# Patient Record
Sex: Female | Born: 1935 | Race: White | Hispanic: No | Smoking: Never smoker
Health system: Southern US, Community
[De-identification: ages and names within clinical notes are randomized; demographics above are authoritative.]

## PROBLEM LIST (undated history)

## (undated) DIAGNOSIS — I5020 Unspecified systolic (congestive) heart failure: Secondary | ICD-10-CM

## (undated) DIAGNOSIS — I10 Essential (primary) hypertension: Secondary | ICD-10-CM

## (undated) DIAGNOSIS — N189 Chronic kidney disease, unspecified: Secondary | ICD-10-CM

## (undated) DIAGNOSIS — G2581 Restless legs syndrome: Secondary | ICD-10-CM

## (undated) DIAGNOSIS — I4819 Other persistent atrial fibrillation: Secondary | ICD-10-CM

## (undated) DIAGNOSIS — I00 Rheumatic fever without heart involvement: Secondary | ICD-10-CM

## (undated) DIAGNOSIS — N200 Calculus of kidney: Secondary | ICD-10-CM

## (undated) DIAGNOSIS — I5189 Other ill-defined heart diseases: Secondary | ICD-10-CM

## (undated) DIAGNOSIS — M1990 Unspecified osteoarthritis, unspecified site: Secondary | ICD-10-CM

## (undated) DIAGNOSIS — R32 Unspecified urinary incontinence: Secondary | ICD-10-CM

## (undated) DIAGNOSIS — G4733 Obstructive sleep apnea (adult) (pediatric): Secondary | ICD-10-CM

## (undated) DIAGNOSIS — Z9289 Personal history of other medical treatment: Secondary | ICD-10-CM

## (undated) DIAGNOSIS — E785 Hyperlipidemia, unspecified: Secondary | ICD-10-CM

## (undated) HISTORY — PX: ABDOMINAL HYSTERECTOMY: SHX81

## (undated) HISTORY — DX: Calculus of kidney: N20.0

## (undated) HISTORY — DX: Unspecified urinary incontinence: R32

## (undated) HISTORY — DX: Unspecified systolic (congestive) heart failure: I50.20

## (undated) HISTORY — DX: Hyperlipidemia, unspecified: E78.5

## (undated) HISTORY — DX: Other ill-defined heart diseases: I51.89

## (undated) HISTORY — DX: Other persistent atrial fibrillation: I48.19

## (undated) HISTORY — PX: TONSILLECTOMY: SHX5217

## (undated) HISTORY — PX: KIDNEY STONE SURGERY: SHX686

## (undated) HISTORY — DX: Obstructive sleep apnea (adult) (pediatric): G47.33

## (undated) HISTORY — DX: Personal history of other medical treatment: Z92.89

## (undated) HISTORY — DX: Restless legs syndrome: G25.81

## (undated) HISTORY — DX: Rheumatic fever without heart involvement: I00

## (undated) HISTORY — PX: BREAST SURGERY: SHX581

## (undated) HISTORY — PX: SPINE SURGERY: SHX786

## (undated) HISTORY — DX: Unspecified osteoarthritis, unspecified site: M19.90

## (undated) HISTORY — DX: Essential (primary) hypertension: I10

---

## 2003-09-28 HISTORY — PX: REPLACEMENT TOTAL KNEE: SUR1224

## 2011-09-29 DIAGNOSIS — M810 Age-related osteoporosis without current pathological fracture: Secondary | ICD-10-CM | POA: Diagnosis not present

## 2011-09-29 DIAGNOSIS — IMO0001 Reserved for inherently not codable concepts without codable children: Secondary | ICD-10-CM | POA: Diagnosis not present

## 2011-10-01 DIAGNOSIS — IMO0001 Reserved for inherently not codable concepts without codable children: Secondary | ICD-10-CM | POA: Diagnosis not present

## 2011-10-01 DIAGNOSIS — M810 Age-related osteoporosis without current pathological fracture: Secondary | ICD-10-CM | POA: Diagnosis not present

## 2011-10-04 DIAGNOSIS — M810 Age-related osteoporosis without current pathological fracture: Secondary | ICD-10-CM | POA: Diagnosis not present

## 2011-10-04 DIAGNOSIS — IMO0001 Reserved for inherently not codable concepts without codable children: Secondary | ICD-10-CM | POA: Diagnosis not present

## 2011-10-08 DIAGNOSIS — Z7901 Long term (current) use of anticoagulants: Secondary | ICD-10-CM | POA: Diagnosis not present

## 2011-10-08 DIAGNOSIS — Z5181 Encounter for therapeutic drug level monitoring: Secondary | ICD-10-CM | POA: Diagnosis not present

## 2011-10-28 DIAGNOSIS — Z7901 Long term (current) use of anticoagulants: Secondary | ICD-10-CM | POA: Diagnosis not present

## 2011-12-03 DIAGNOSIS — Z7901 Long term (current) use of anticoagulants: Secondary | ICD-10-CM | POA: Diagnosis not present

## 2011-12-15 ENCOUNTER — Telehealth: Payer: Self-pay | Admitting: Family Medicine

## 2011-12-15 NOTE — Telephone Encounter (Signed)
Pt is new to est have her set up for 01/03/12 but daughter is concerned because she is having a lot of back pain. Can she been worked in for a new pt appt sooner?

## 2011-12-15 NOTE — Telephone Encounter (Signed)
yes

## 2011-12-20 ENCOUNTER — Encounter: Payer: Self-pay | Admitting: Family Medicine

## 2011-12-20 ENCOUNTER — Ambulatory Visit (INDEPENDENT_AMBULATORY_CARE_PROVIDER_SITE_OTHER): Payer: Medicare Other | Admitting: Family Medicine

## 2011-12-20 VITALS — BP 130/90 | HR 72 | Temp 97.6°F | Resp 12 | Ht 62.0 in | Wt 188.0 lb

## 2011-12-20 DIAGNOSIS — M1990 Unspecified osteoarthritis, unspecified site: Secondary | ICD-10-CM | POA: Insufficient documentation

## 2011-12-20 DIAGNOSIS — N3941 Urge incontinence: Secondary | ICD-10-CM | POA: Insufficient documentation

## 2011-12-20 DIAGNOSIS — M5416 Radiculopathy, lumbar region: Secondary | ICD-10-CM

## 2011-12-20 DIAGNOSIS — I4891 Unspecified atrial fibrillation: Secondary | ICD-10-CM

## 2011-12-20 DIAGNOSIS — G4733 Obstructive sleep apnea (adult) (pediatric): Secondary | ICD-10-CM | POA: Insufficient documentation

## 2011-12-20 DIAGNOSIS — H6120 Impacted cerumen, unspecified ear: Secondary | ICD-10-CM

## 2011-12-20 DIAGNOSIS — Z87442 Personal history of urinary calculi: Secondary | ICD-10-CM | POA: Insufficient documentation

## 2011-12-20 DIAGNOSIS — I10 Essential (primary) hypertension: Secondary | ICD-10-CM | POA: Insufficient documentation

## 2011-12-20 DIAGNOSIS — E785 Hyperlipidemia, unspecified: Secondary | ICD-10-CM | POA: Diagnosis not present

## 2011-12-20 DIAGNOSIS — G2581 Restless legs syndrome: Secondary | ICD-10-CM | POA: Insufficient documentation

## 2011-12-20 DIAGNOSIS — IMO0002 Reserved for concepts with insufficient information to code with codable children: Secondary | ICD-10-CM

## 2011-12-20 DIAGNOSIS — M810 Age-related osteoporosis without current pathological fracture: Secondary | ICD-10-CM | POA: Insufficient documentation

## 2011-12-20 LAB — LIPID PANEL
Cholesterol: 169 mg/dL (ref 0–200)
LDL Cholesterol: 69 mg/dL (ref 0–99)
Total CHOL/HDL Ratio: 2

## 2011-12-20 LAB — BASIC METABOLIC PANEL
BUN: 23 mg/dL (ref 6–23)
Calcium: 9.6 mg/dL (ref 8.4–10.5)
Chloride: 106 mEq/L (ref 96–112)
Creatinine, Ser: 1 mg/dL (ref 0.4–1.2)

## 2011-12-20 LAB — HEPATIC FUNCTION PANEL
AST: 26 U/L (ref 0–37)
Alkaline Phosphatase: 46 U/L (ref 39–117)
Bilirubin, Direct: 0.1 mg/dL (ref 0.0–0.3)
Total Protein: 6.9 g/dL (ref 6.0–8.3)

## 2011-12-20 MED ORDER — ROPINIROLE HCL 1 MG PO TABS: 1.0000 mg | ORAL_TABLET | Freq: Two times a day (BID) | ORAL | Status: DC

## 2011-12-20 NOTE — Progress Notes (Signed)
Subjective:    Patient ID: Angel Meyer, female    DOB: [birth year], 78 y.o.   MRN: [id number]  HPI  Patient new to establish care. She lives in [place] but is here several months of the year staying with family. She has a fairly complicated past medical history which is reviewed. Old records pending at this time. She has history of osteoarthritis, multiple back surgeries, history of atrial fibrillation, hypertension, hyperlipidemia, questionable history of CHF, history of kidney stones, osteoporosis, urge urine incontinence, restless leg syndrome, and obstructive sleep apnea. Recently went for hearing aid and was told she had cerumen impaction and needs irrigation.  Other acute issue is that she's had recurrent low back pain. Several months of progressive right lumbar back pain with right radiculopathy symptoms. No clear weakness. Pain radiates down right lower extremity all the way to foot. No numbness. 9/10 severity at times. Using Tylenol and heat with minimal relief.  Requesting refills of ropinirole for restless legs.  She has occasional constipation which may be related to her Ditropan for urinary urgency.  Past Medical History  Diagnosis Date  . Arthritis   . Arrhythmia   . Chronic kidney disease     stones  . Hypertension   . Hyperlipidemia   . Rheumatic fever   . History of blood transfusion   . Incontinence of urine   . CHF (congestive heart failure)     by history.  Records pending  . Atrial fibrillation   . Osteoporosis   . OSA (obstructive sleep apnea)   . Restless legs    Past Surgical History  Procedure Date  . Breast surgery     biopsy  . Tonsillectomy   . Abdominal hysterectomy   . Spine surgery     laminectomy 1965, spinal fusion with rod 2005  . Replacement total knee 2005  . Kidney stone surgery     1997 removed    reports that she has never smoked. She does not have any smokeless tobacco history on file. Her alcohol and drug histories not on file. family  history includes Cancer in her mother and paternal grandfather and Diabetes in her mother. Not on File    Review of Systems  Constitutional: Positive for fatigue. Negative for fever, chills, activity change and unexpected weight change.  HENT: Positive for hearing loss. Negative for trouble swallowing.   Respiratory: Negative for cough, shortness of breath and wheezing.   Cardiovascular: Negative for chest pain, palpitations and leg swelling.  Gastrointestinal: Positive for constipation. Negative for nausea, vomiting and blood in stool.  Genitourinary: Negative for dysuria.  Musculoskeletal: Positive for back pain. Negative for joint swelling.  Skin: Negative for rash.  Neurological: Negative for dizziness and syncope.  Hematological: Negative for adenopathy. Does not bruise/bleed easily.  Psychiatric/Behavioral: Negative for confusion and dysphoric mood.       Objective:   Physical Exam  Constitutional: She is oriented to person, place, and time. She appears well-developed and well-nourished. No distress.  HENT:  Mouth/Throat: Oropharynx is clear and moist.       Cerumen impaction right canal. Minimal cerumen left canal.  Removed with irrigation and TMs normal.  Neck: Neck supple. No thyromegaly present.  Cardiovascular: Normal rate and regular rhythm.  Exam reveals no gallop.   Pulmonary/Chest: Effort normal and breath sounds normal. No respiratory distress. She has no wheezes. She has no rales.  Musculoskeletal: She exhibits no edema.  Lymphadenopathy:    She has no cervical adenopathy.  Neurological: She is  alert and oriented to person, place, and time. No cranial nerve deficit.       Reflexes symmetrical lower extremity. No strength deficits noted  Skin: No rash noted.       Small bruise left anterior leg. No skin break  Psychiatric: She has a normal mood and affect. Her behavior is normal. Judgment and thought content normal.          Assessment & Plan:  #1 cerumen  impaction. Irrigation and then she will proceed with audiology evaluation #2 history of chronic low back pain with multiple surgeries. Recent progressive right radiculopathy symptoms for several months. Consider neurosurgical referral. We'll need to get old records #3 history of restless leg syndrome. Refill ropinirole #4 history of osteoarthritis involving multiple joints #5 history of atrial fibrillation on chronic Coumadin. Patient wishes to establish with local cardiologist. Questionable history of CHF.  Get old records. Reportedly had INR 2 weeks ago normal-recheck today. #6 hypertension stable- check basic metabolic panel #7 hyperlipidemia. Patient presents with an interesting regimen of 2 different statins and Zetia. Check lipid and hepatic panel  #8 history of osteoporosis  #9 history of urge urinary incontinence #10 obstructive sleep apnea. Patient currently on CPAP

## 2011-12-20 NOTE — Patient Instructions (Signed)
  Latest dosing instructions   Total Glynis Smiles Tue Wed Thu Fri Sat   25 5 mg 2.5 mg 5 mg 2.5 mg 2.5 mg 2.5 mg 5 mg    (2.5 mg2) (2.5 mg1) (2.5 mg2) (2.5 mg1) (2.5 mg1) (2.5 mg1) (2.5 mg2)

## 2011-12-21 ENCOUNTER — Telehealth: Payer: Self-pay | Admitting: Speech Pathology

## 2011-12-21 ENCOUNTER — Other Ambulatory Visit: Payer: Self-pay | Admitting: Family Medicine

## 2011-12-21 DIAGNOSIS — I4891 Unspecified atrial fibrillation: Secondary | ICD-10-CM

## 2011-12-21 MED ORDER — ROPINIROLE HCL 1 MG PO TABS: 1.0000 mg | ORAL_TABLET | Freq: Two times a day (BID) | ORAL | Status: DC

## 2011-12-21 NOTE — Telephone Encounter (Signed)
Pt needs a refill for Ropinirol 1 mg, 2 x a day.  She would like this called into [facility] in [place]. She uses this for Restless Leg Syndrome.

## 2011-12-21 NOTE — Progress Notes (Signed)
Quick Note:  Pt informed and I will schedule future INR in case she is still in town ______

## 2012-01-03 ENCOUNTER — Ambulatory Visit: Payer: Self-pay | Admitting: Family Medicine

## 2012-01-11 DIAGNOSIS — M76899 Other specified enthesopathies of unspecified lower limb, excluding foot: Secondary | ICD-10-CM | POA: Diagnosis not present

## 2012-01-11 DIAGNOSIS — M545 Low back pain: Secondary | ICD-10-CM | POA: Diagnosis not present

## 2012-01-19 ENCOUNTER — Institutional Professional Consult (permissible substitution): Payer: Medicare Other | Admitting: Internal Medicine

## 2012-02-08 DIAGNOSIS — M545 Low back pain: Secondary | ICD-10-CM | POA: Diagnosis not present

## 2012-02-08 DIAGNOSIS — M1610 Unilateral primary osteoarthritis, unspecified hip: Secondary | ICD-10-CM | POA: Diagnosis not present

## 2012-02-08 DIAGNOSIS — IMO0002 Reserved for concepts with insufficient information to code with codable children: Secondary | ICD-10-CM | POA: Diagnosis not present

## 2012-02-08 DIAGNOSIS — M961 Postlaminectomy syndrome, not elsewhere classified: Secondary | ICD-10-CM | POA: Diagnosis not present

## 2012-02-09 DIAGNOSIS — Z7901 Long term (current) use of anticoagulants: Secondary | ICD-10-CM | POA: Diagnosis not present

## 2012-02-09 DIAGNOSIS — Z5181 Encounter for therapeutic drug level monitoring: Secondary | ICD-10-CM | POA: Diagnosis not present

## 2012-02-10 DIAGNOSIS — I4891 Unspecified atrial fibrillation: Secondary | ICD-10-CM | POA: Diagnosis not present

## 2012-02-10 DIAGNOSIS — E7800 Pure hypercholesterolemia, unspecified: Secondary | ICD-10-CM | POA: Diagnosis not present

## 2012-02-10 DIAGNOSIS — I509 Heart failure, unspecified: Secondary | ICD-10-CM | POA: Diagnosis not present

## 2012-02-15 DIAGNOSIS — I059 Rheumatic mitral valve disease, unspecified: Secondary | ICD-10-CM | POA: Diagnosis not present

## 2012-02-15 DIAGNOSIS — I10 Essential (primary) hypertension: Secondary | ICD-10-CM | POA: Diagnosis not present

## 2012-02-15 DIAGNOSIS — I509 Heart failure, unspecified: Secondary | ICD-10-CM | POA: Diagnosis not present

## 2012-02-15 DIAGNOSIS — I4891 Unspecified atrial fibrillation: Secondary | ICD-10-CM | POA: Diagnosis not present

## 2012-02-25 DIAGNOSIS — Z5181 Encounter for therapeutic drug level monitoring: Secondary | ICD-10-CM | POA: Diagnosis not present

## 2012-02-25 DIAGNOSIS — M961 Postlaminectomy syndrome, not elsewhere classified: Secondary | ICD-10-CM | POA: Diagnosis not present

## 2012-02-25 DIAGNOSIS — Z01818 Encounter for other preprocedural examination: Secondary | ICD-10-CM | POA: Diagnosis not present

## 2012-02-25 DIAGNOSIS — M1610 Unilateral primary osteoarthritis, unspecified hip: Secondary | ICD-10-CM | POA: Diagnosis not present

## 2012-02-25 DIAGNOSIS — Z7901 Long term (current) use of anticoagulants: Secondary | ICD-10-CM | POA: Diagnosis not present

## 2012-02-25 DIAGNOSIS — M545 Low back pain, unspecified: Secondary | ICD-10-CM | POA: Diagnosis not present

## 2012-02-25 DIAGNOSIS — M533 Sacrococcygeal disorders, not elsewhere classified: Secondary | ICD-10-CM | POA: Diagnosis not present

## 2012-03-23 DIAGNOSIS — M4180 Other forms of scoliosis, site unspecified: Secondary | ICD-10-CM | POA: Diagnosis not present

## 2012-03-23 DIAGNOSIS — M4120 Other idiopathic scoliosis, site unspecified: Secondary | ICD-10-CM | POA: Diagnosis not present

## 2012-03-23 DIAGNOSIS — I509 Heart failure, unspecified: Secondary | ICD-10-CM | POA: Diagnosis not present

## 2012-03-23 DIAGNOSIS — R0602 Shortness of breath: Secondary | ICD-10-CM | POA: Diagnosis not present

## 2012-03-23 DIAGNOSIS — R109 Unspecified abdominal pain: Secondary | ICD-10-CM | POA: Diagnosis not present

## 2012-03-23 DIAGNOSIS — M546 Pain in thoracic spine: Secondary | ICD-10-CM | POA: Diagnosis not present

## 2012-03-23 DIAGNOSIS — E7800 Pure hypercholesterolemia, unspecified: Secondary | ICD-10-CM | POA: Diagnosis not present

## 2012-03-23 DIAGNOSIS — IMO0002 Reserved for concepts with insufficient information to code with codable children: Secondary | ICD-10-CM | POA: Diagnosis not present

## 2012-03-23 DIAGNOSIS — R079 Chest pain, unspecified: Secondary | ICD-10-CM | POA: Diagnosis not present

## 2012-03-23 DIAGNOSIS — Z981 Arthrodesis status: Secondary | ICD-10-CM | POA: Diagnosis not present

## 2012-03-27 DIAGNOSIS — Z7901 Long term (current) use of anticoagulants: Secondary | ICD-10-CM | POA: Diagnosis not present

## 2012-03-27 DIAGNOSIS — Z5181 Encounter for therapeutic drug level monitoring: Secondary | ICD-10-CM | POA: Diagnosis not present

## 2012-03-28 DIAGNOSIS — M546 Pain in thoracic spine: Secondary | ICD-10-CM | POA: Diagnosis not present

## 2012-03-28 DIAGNOSIS — M25559 Pain in unspecified hip: Secondary | ICD-10-CM | POA: Diagnosis not present

## 2012-03-29 DIAGNOSIS — E782 Mixed hyperlipidemia: Secondary | ICD-10-CM | POA: Diagnosis not present

## 2012-03-29 DIAGNOSIS — Z79899 Other long term (current) drug therapy: Secondary | ICD-10-CM | POA: Diagnosis not present

## 2012-03-29 DIAGNOSIS — M546 Pain in thoracic spine: Secondary | ICD-10-CM | POA: Diagnosis not present

## 2012-03-29 DIAGNOSIS — Z5181 Encounter for therapeutic drug level monitoring: Secondary | ICD-10-CM | POA: Diagnosis not present

## 2012-05-04 DIAGNOSIS — M1610 Unilateral primary osteoarthritis, unspecified hip: Secondary | ICD-10-CM | POA: Diagnosis not present

## 2012-05-11 DIAGNOSIS — Z7901 Long term (current) use of anticoagulants: Secondary | ICD-10-CM | POA: Diagnosis not present

## 2012-05-11 DIAGNOSIS — Z5181 Encounter for therapeutic drug level monitoring: Secondary | ICD-10-CM | POA: Diagnosis not present

## 2012-06-02 DIAGNOSIS — Z7901 Long term (current) use of anticoagulants: Secondary | ICD-10-CM | POA: Diagnosis not present

## 2012-06-02 DIAGNOSIS — Z5181 Encounter for therapeutic drug level monitoring: Secondary | ICD-10-CM | POA: Diagnosis not present

## 2012-06-12 DIAGNOSIS — Z7901 Long term (current) use of anticoagulants: Secondary | ICD-10-CM | POA: Diagnosis not present

## 2012-06-16 ENCOUNTER — Ambulatory Visit (INDEPENDENT_AMBULATORY_CARE_PROVIDER_SITE_OTHER): Payer: Medicare Other | Admitting: Internal Medicine

## 2012-06-16 ENCOUNTER — Encounter: Payer: Self-pay | Admitting: Internal Medicine

## 2012-06-16 VITALS — BP 124/76 | HR 61 | Ht 62.0 in | Wt 185.0 lb

## 2012-06-16 DIAGNOSIS — E785 Hyperlipidemia, unspecified: Secondary | ICD-10-CM

## 2012-06-16 DIAGNOSIS — I4891 Unspecified atrial fibrillation: Secondary | ICD-10-CM | POA: Diagnosis not present

## 2012-06-16 DIAGNOSIS — I10 Essential (primary) hypertension: Secondary | ICD-10-CM | POA: Diagnosis not present

## 2012-06-16 NOTE — Progress Notes (Signed)
Primary Care Physician: Kristian Covey, MD Primary Cardiologist in [place]:  Dr Janett Billow  Angel Meyer is a 78 y.o. female with a h/o atrial fibirillation and diastolic dysfunction who presents to establish care.  She reports that she initially developed atrial fibrillation in 2000.  During afib, she develops palpitations, fatigue, and diastolic dysfunction.  She has been cardioverted on three occasions, most recently 3/12.  She is unaware of triggers or precipitants of her afib, though he daughter reports that with her first episode she was recovering from a virus.  She has a h/o childhood rheumatic fever. She is primarily limited by her back and hip.  He daughter notices that she has SOB with moderate activity.  She also reports mild postural dizziness. Today, she denies symptoms of chest pain, orthopnea, PND, lower extremity edema, dizziness, presyncope, syncope, or neurologic sequela. The patient is tolerating medications without difficulties and is otherwise without complaint today.   Past Medical History  Diagnosis Date  . Arthritis   . Persistent atrial fibrillation   . Renal calculi   . Hypertension   . Hyperlipidemia   . Rheumatic fever     age 24  . History of blood transfusion   . Incontinence of urine   . Diastolic dysfunction     preserved EF,  CHF In setting of afib previously  . Osteoporosis   . OSA (obstructive sleep apnea)     uses CPAP  . Restless legs    Past Surgical History  Procedure Date  . Breast surgery     benign biopsy  . Tonsillectomy   . Abdominal hysterectomy   . Spine surgery     laminectomy 1965, spinal fusion with rod 2005  . Replacement total knee 2005  . Kidney stone surgery     1997 removed    Current Outpatient Prescriptions  Medication Sig Dispense Refill  . acetaminophen (TYLENOL) 650 MG CR tablet Take 650 mg by mouth every 8 (eight) hours as needed.      Marland Kitchen aspirin 81 MG chewable tablet Chew by mouth. 1 tab in the am, 2 tabs in  the pm      . Cholecalciferol (VITAMIN D) 2000 UNITS tablet Take 2,000 Units by mouth daily.      . Coenzyme Q10 (CO Q-10) 300 MG CAPS Take by mouth daily.      . cyanocobalamin 1000 MCG tablet Take 100 mcg by mouth daily.      Marland Kitchen diltiazem (CARDIZEM CD) 240 MG 24 hr capsule Take 240 mg by mouth daily.      . diphenhydrAMINE (SOMINEX) 25 MG tablet Take 25 mg by mouth at bedtime as needed.      Marland Kitchen escitalopram (LEXAPRO) 10 MG tablet Take 10 mg by mouth daily.      . famotidine (PEPCID) 20 MG tablet Take 20 mg by mouth 2 (two) times daily.      . furosemide (LASIX) 40 MG tablet Take 40 mg by mouth daily.      [name] Oil 300 MG CAPS Take by mouth daily.      Marland Kitchen lisinopril (PRINIVIL,ZESTRIL) 10 MG tablet Take 10 mg by mouth daily.      . Magnesium 500 MG TABS Take by mouth daily.      Marland Kitchen oxybutynin (DITROPAN-XL) 10 MG 24 hr tablet Take 10 mg by mouth daily.      . potassium chloride SA (K-DUR,KLOR-CON) 20 MEQ tablet Take 20 mEq by mouth daily.      Marland Kitchen  rOPINIRole (REQUIP) 1 MG tablet Take 1 tablet (1 mg total) by mouth 2 (two) times daily.  60 tablet  11  . rosuvastatin (CRESTOR) 40 MG tablet Take 40 mg by mouth daily.      Marland Kitchen warfarin (COUMADIN) 2 MG tablet Take 2 mg by mouth as directed.        Allergies  Allergen Reactions  . Ciprofloxacin   . Demerol (Meperidine)   . Sulfa Antibiotics     History   Social History  . Marital Status: Divorced    Spouse Name: N/A    Number of Children: N/A  . Years of Education: N/A   Occupational History  . Not on file.   Social History Main Topics  . Smoking status: Never Smoker   . Smokeless tobacco: Not on file  . Alcohol Use: Yes     glass of wine each day  . Drug Use: No  . Sexually Active: Not on file   Other Topics Concern  . Not on file   Social History Narrative   Lives in [place] alone.  Spends 4-6 months per year in [place] with her daughter.Retired Dipl[employer] Services operational officer    Family History  Problem Relation Age of Onset  . Cancer Mother      breast  . Diabetes Mother   . Cancer Paternal Grandfather     ROS- All systems are reviewed and negative except as per the HPI above  Physical Exam: Filed Vitals:   06/16/12 0905  BP: 124/76  Pulse: 61  Height: 5\' 2"  (1.575 m)  Weight: 185 lb (83.915 kg)  SpO2: 95%    GEN- The patient is well appearing, alert and oriented x 3 today.   Head- normocephalic, atraumatic Eyes-  Sclera clear, conjunctiva pink Ears- hearing intact Oropharynx- clear Neck- supple, no JVP Lymph- no cervical lymphadenopathy Lungs- Clear to ausculation bilaterally, normal work of breathing Heart- Regular rate and rhythm, no murmurs, rubs or gallops, PMI not laterally displaced GI- soft, NT, ND, + BS Extremities- no clubbing, cyanosis, or edema MS- no significant deformity or atrophy Skin- no rash or lesion Psych- euthymic mood, full affect Neuro- strength and sensation are intact  EKG today reveals sinus rhythm 61 bpm, PR 164, QRS 78, QTc 453, septal infarct pattern Echo 03/23/11- EF normal, mild MR, mild LA enlargement  Myoview 03/19/11- myoview 03/19/11- no ischemia, normal EF   Assessment and Plan:

## 2012-06-16 NOTE — Patient Instructions (Addendum)
Your physician recommends that you schedule a follow-up appointment as needed   Your physician has recommended you make the following change in your medication:  1) Stop Aspirin

## 2012-06-26 NOTE — Assessment & Plan Note (Signed)
Lipids reviewed No changes

## 2012-06-26 NOTE — Assessment & Plan Note (Signed)
The patient presents today to establish care as she spends part of the year in [place].  She has been well cared for in [place] and continues to see her local cardiologist there when she is home.  Continue current regimen with coumadin.  When she eventually moved to [place] full time, we can follow her INRs.  Stop ASA today.

## 2012-06-26 NOTE — Assessment & Plan Note (Signed)
Stable No change required today  

## 2012-06-29 ENCOUNTER — Encounter: Payer: Self-pay | Admitting: Internal Medicine

## 2012-07-26 DIAGNOSIS — I4891 Unspecified atrial fibrillation: Secondary | ICD-10-CM | POA: Diagnosis not present

## 2012-07-26 DIAGNOSIS — E7800 Pure hypercholesterolemia, unspecified: Secondary | ICD-10-CM | POA: Diagnosis not present

## 2012-07-26 DIAGNOSIS — I10 Essential (primary) hypertension: Secondary | ICD-10-CM | POA: Diagnosis not present

## 2012-08-14 DIAGNOSIS — Z5181 Encounter for therapeutic drug level monitoring: Secondary | ICD-10-CM | POA: Diagnosis not present

## 2012-08-14 DIAGNOSIS — Z7901 Long term (current) use of anticoagulants: Secondary | ICD-10-CM | POA: Diagnosis not present

## 2012-08-15 DIAGNOSIS — I4891 Unspecified atrial fibrillation: Secondary | ICD-10-CM | POA: Diagnosis not present

## 2012-08-15 DIAGNOSIS — E7800 Pure hypercholesterolemia, unspecified: Secondary | ICD-10-CM | POA: Diagnosis not present

## 2012-08-15 DIAGNOSIS — I10 Essential (primary) hypertension: Secondary | ICD-10-CM | POA: Diagnosis not present

## 2012-08-15 DIAGNOSIS — I059 Rheumatic mitral valve disease, unspecified: Secondary | ICD-10-CM | POA: Diagnosis not present

## 2012-09-06 DIAGNOSIS — H2510 Age-related nuclear cataract, unspecified eye: Secondary | ICD-10-CM | POA: Diagnosis not present

## 2012-09-06 DIAGNOSIS — Z7901 Long term (current) use of anticoagulants: Secondary | ICD-10-CM | POA: Diagnosis not present

## 2012-09-06 DIAGNOSIS — Z5181 Encounter for therapeutic drug level monitoring: Secondary | ICD-10-CM | POA: Diagnosis not present

## 2012-09-13 DIAGNOSIS — Z7901 Long term (current) use of anticoagulants: Secondary | ICD-10-CM | POA: Diagnosis not present

## 2012-09-18 ENCOUNTER — Ambulatory Visit (INDEPENDENT_AMBULATORY_CARE_PROVIDER_SITE_OTHER): Payer: Medicare Other | Admitting: Family Medicine

## 2012-09-18 ENCOUNTER — Encounter: Payer: Self-pay | Admitting: Family Medicine

## 2012-09-18 VITALS — BP 120/80 | Temp 99.8°F | Wt 188.0 lb

## 2012-09-18 DIAGNOSIS — R059 Cough, unspecified: Secondary | ICD-10-CM

## 2012-09-18 DIAGNOSIS — R05 Cough: Secondary | ICD-10-CM | POA: Diagnosis not present

## 2012-09-18 MED ORDER — HYDROCODONE-HOMATROPINE 5-1.5 MG/5ML PO SYRP: 5.0000 mL | ORAL_SOLUTION | Freq: Four times a day (QID) | ORAL | Status: AC | PRN

## 2012-09-18 MED ORDER — AZITHROMYCIN 250 MG PO TABS: ORAL_TABLET | ORAL | Status: DC

## 2012-09-18 NOTE — Progress Notes (Signed)
  Subjective:    Patient ID: Angel Meyer, female    DOB: [birth year], 78 y.o.   MRN: [id number]  HPI  Acute visit. Onset about 9 days ago typical cold-like symptoms. Over the past 2 days developed chills and possible low-grade fever though temperature not taken. She's had cough which is mostly dry but occasionally productive. Mild diarrhea yesterday but none today. No nausea or vomiting. No dyspnea. Nonsmoker. History of allergies to Cipro and sulfa.  Patient try Mucinex and over-the-counter tests and without much relief. Her chronic problems include history obstructive sleep apnea, hypertension, hyperlipidemia, osteoarthritis, and osteoporosis. She has chronic atrial fibrillation on Coumadin. INR reportedly normal last week. No recent bleeding complications   Review of Systems  Constitutional: Positive for chills.  HENT: Positive for congestion. Negative for sore throat and voice change.   Respiratory: Positive for cough. Negative for shortness of breath and wheezing.   Cardiovascular: Negative for chest pain.  Hematological: Does not bruise/bleed easily.       Objective:   Physical Exam  Constitutional: She appears well-developed and well-nourished.  HENT:  Mouth/Throat: Oropharynx is clear and moist.       Right canal is full cerumen. Left is normal  Neck: Neck supple.  Cardiovascular: Normal rate.   Pulmonary/Chest: Effort normal and breath sounds normal. No respiratory distress. She has no wheezes. She has no rales.  Musculoskeletal: She exhibits no edema.  Lymphadenopathy:    She has no cervical adenopathy.          Assessment & Plan:  Cough. Concerning is the fact that at her age she has chills and possible low-grade fever 9 days into what sounded like typical viral illness. Start Zithromax. Hycodan for nighttime cough. Followup promptly for any worsening symptoms. Chest x-ray if any worsening symptoms

## 2012-09-18 NOTE — Patient Instructions (Addendum)
Follow up for any shortness of breath, vomiting, or any increasing fever.

## 2012-09-29 DIAGNOSIS — Z7901 Long term (current) use of anticoagulants: Secondary | ICD-10-CM | POA: Diagnosis not present

## 2012-10-03 ENCOUNTER — Ambulatory Visit: Payer: Medicare Other | Admitting: Internal Medicine

## 2012-10-13 DIAGNOSIS — Z7901 Long term (current) use of anticoagulants: Secondary | ICD-10-CM | POA: Diagnosis not present

## 2012-10-23 DIAGNOSIS — Z7901 Long term (current) use of anticoagulants: Secondary | ICD-10-CM | POA: Diagnosis not present

## 2012-10-27 DIAGNOSIS — Z7901 Long term (current) use of anticoagulants: Secondary | ICD-10-CM | POA: Diagnosis not present

## 2012-11-02 DIAGNOSIS — Z7901 Long term (current) use of anticoagulants: Secondary | ICD-10-CM | POA: Diagnosis not present

## 2012-11-06 ENCOUNTER — Encounter: Payer: Self-pay | Admitting: Family Medicine

## 2012-11-06 ENCOUNTER — Ambulatory Visit (INDEPENDENT_AMBULATORY_CARE_PROVIDER_SITE_OTHER): Payer: Medicare Other | Admitting: Family Medicine

## 2012-11-06 VITALS — BP 102/68 | HR 92 | Temp 97.9°F | Wt 189.0 lb

## 2012-11-06 DIAGNOSIS — J069 Acute upper respiratory infection, unspecified: Secondary | ICD-10-CM

## 2012-11-06 MED ORDER — HYDROCODONE-HOMATROPINE 5-1.5 MG/5ML PO SYRP: 5.0000 mL | ORAL_SOLUTION | Freq: Three times a day (TID) | ORAL | Status: DC | PRN

## 2012-11-06 MED ORDER — AMOXICILLIN 500 MG PO CAPS: 500.0000 mg | ORAL_CAPSULE | Freq: Three times a day (TID) | ORAL | Status: DC

## 2012-11-06 NOTE — Patient Instructions (Addendum)
INSTRUCTIONS FOR UPPER RESPIRATORY INFECTION:  -plenty of rest and fluids  -if you take the antibiotic then let your cardiologist know prior to starting it so that he can appropriately follow your coumadin levels  -nasal saline wash 2-3 times daily (use prepackaged nasal saline or bottled/distilled water if making your own)   -can use sinex or afrin nasal spray for drainage and nasal congestion - but do NOT use longer then 3-4 days  -can use tylenol if needed as directed for aches and sorethroat  -in the winter time, using a humidifier at night is helpful (please follow cleaning instructions)  -if you are taking a cough medication - use only as directed, may also try a teaspoon of honey to coat the throat and throat lozenges  -for sore throat, salt water gargles can help  -follow up if you have fevers, facial pain, tooth pain, difficulty breathing or are worsening or not getting better in 5-7 days

## 2012-11-06 NOTE — Progress Notes (Signed)
Chief Complaint  Patient presents with  . Cough    congestion, fatigue, chills     HPI:  Acute visit for chest congestion: -started: about 2 weeks ago -symptoms:nasal congestion - blowing nose constantly, sore throat, cough - productive of clear mucus, mild SOB - but has this off and on at baseline -denies:fever, NVD, tooth pain -has tried: musinex DM -sick contacts:  Yes, family with cold -Hx of: chronic lung disease   ROS: See pertinent positives and negatives per HPI.  Past Medical History  Diagnosis Date  . Arthritis   . Persistent atrial fibrillation   . Renal calculi   . Hypertension   . Hyperlipidemia   . Rheumatic fever     age 79  . History of blood transfusion   . Incontinence of urine   . Diastolic dysfunction     preserved EF,  CHF In setting of afib previously  . Osteoporosis   . OSA (obstructive sleep apnea)     uses CPAP  . Restless legs     Family History  Problem Relation Age of Onset  . Cancer Mother     breast  . Diabetes Mother   . Cancer Paternal Grandfather     History   Social History  . Marital Status: Divorced    Spouse Name: N/A    Number of Children: N/A  . Years of Education: N/A   Social History Main Topics  . Smoking status: Never Smoker   . Smokeless tobacco: None  . Alcohol Use: Yes     Comment: glass of wine each day  . Drug Use: No  . Sexually Active: None   Other Topics Concern  . None   Social History Narrative   Lives in [place] alone.  Spends 4-6 months per year in [place] with her daughter.   Retired Diplomatic Services operational officer    Current outpatient prescriptions:acetaminophen (TYLENOL) 650 MG CR tablet, Take 650 mg by mouth every 8 (eight) hours as needed., Disp: , Rfl: ;  azithromycin (ZITHROMAX) 250 MG tablet, 2 po today then one daily for 4 more days, Disp: 6 tablet, Rfl: 0;  Cholecalciferol (VITAMIN D) 2000 UNITS tablet, Take 2,000 Units by mouth daily., Disp: , Rfl: ;  Coenzyme Q10 (CO Q-10) 300 MG CAPS, Take by mouth  daily., Disp: , Rfl:  cyanocobalamin 1000 MCG tablet, Take 100 mcg by mouth daily., Disp: , Rfl: ;  diltiazem (CARDIZEM CD) 240 MG 24 hr capsule, Take 240 mg by mouth daily., Disp: , Rfl: ;  diphenhydrAMINE (SOMINEX) 25 MG tablet, Take 25 mg by mouth at bedtime as needed., Disp: , Rfl: ;  escitalopram (LEXAPRO) 10 MG tablet, Take 10 mg by mouth daily., Disp: , Rfl: ;  famotidine (PEPCID) 20 MG tablet, Take 20 mg by mouth 2 (two) times daily., Disp: , Rfl:  furosemide (LASIX) 40 MG tablet, Take 40 mg by mouth daily., Disp: , Rfl: ;  Krill Oil 300 MG CAPS, Take by mouth daily., Disp: , Rfl: ;  lisinopril (PRINIVIL,ZESTRIL) 10 MG tablet, Take 10 mg by mouth daily., Disp: , Rfl: ;  Magnesium 500 MG TABS, Take by mouth daily., Disp: , Rfl: ;  oxybutynin (DITROPAN-XL) 10 MG 24 hr tablet, Take 10 mg by mouth daily., Disp: , Rfl:  potassium chloride SA (K-DUR,KLOR-CON) 20 MEQ tablet, Take 20 mEq by mouth daily., Disp: , Rfl: ;  rOPINIRole (REQUIP) 1 MG tablet, Take 1 tablet (1 mg total) by mouth 2 (two) times daily., Disp: 60 tablet, Rfl:  11;  rosuvastatin (CRESTOR) 40 MG tablet, Take 40 mg by mouth daily., Disp: , Rfl: ;  warfarin (COUMADIN) 2 MG tablet, Take 2 mg by mouth as directed., Disp: , Rfl:   EXAM:  Filed Vitals:   11/06/12 1337  BP: 102/68  Pulse: 92  Temp: 97.9 F (36.6 C)    Body mass index is 34.56 kg/(m^2).  GENERAL: vitals reviewed and listed above, alert, oriented, appears well hydrated and in no acute distress  HEENT: atraumatic, conjunttiva clear, no obvious abnormalities on inspection of external nose and ears, normal appearance of ear canals and TMs, clear nasal congestion, mild post oropharyngeal erythema with PND, no tonsillar edema or exudate, no sinus TTP  NECK: no obvious masses on inspection  LUNGS: clear to auscultation bilaterally, no wheezes, rales or rhonchi, good air movement  CV: irr irr, no peripheral edema  MS: moves all extremities without noticeable  abnormality  PSYCH: pleasant and cooperative, no obvious depression or anxiety  ASSESSMENT AND PLAN:  Discussed the following assessment and plan:  1. Acute upper respiratory infections of unspecified site    -lung CTA, likley viral URI, but given length of symptoms offered abx - they will hold off on this for a few days. If they start it they will notify cardiologist as we discussed it can impact her blood thinner. Risks discussed. Return precautions discussed. -Patient advised to return or notify a doctor immediately if symptoms worsen or persist or new concerns arise.  There are no Patient Instructions on file for this visit.   Kriste Basque R.

## 2012-11-08 DIAGNOSIS — Z7901 Long term (current) use of anticoagulants: Secondary | ICD-10-CM | POA: Diagnosis not present

## 2012-12-06 DIAGNOSIS — Z7901 Long term (current) use of anticoagulants: Secondary | ICD-10-CM | POA: Diagnosis not present

## 2012-12-18 DIAGNOSIS — R109 Unspecified abdominal pain: Secondary | ICD-10-CM | POA: Diagnosis not present

## 2012-12-18 DIAGNOSIS — R82998 Other abnormal findings in urine: Secondary | ICD-10-CM | POA: Diagnosis not present

## 2012-12-20 DIAGNOSIS — R109 Unspecified abdominal pain: Secondary | ICD-10-CM | POA: Diagnosis not present

## 2012-12-20 DIAGNOSIS — Z5181 Encounter for therapeutic drug level monitoring: Secondary | ICD-10-CM | POA: Diagnosis not present

## 2012-12-20 DIAGNOSIS — Z7901 Long term (current) use of anticoagulants: Secondary | ICD-10-CM | POA: Diagnosis not present

## 2012-12-20 DIAGNOSIS — R82998 Other abnormal findings in urine: Secondary | ICD-10-CM | POA: Diagnosis not present

## 2013-01-15 DIAGNOSIS — E7800 Pure hypercholesterolemia, unspecified: Secondary | ICD-10-CM | POA: Diagnosis not present

## 2013-01-15 DIAGNOSIS — F329 Major depressive disorder, single episode, unspecified: Secondary | ICD-10-CM | POA: Diagnosis not present

## 2013-01-15 DIAGNOSIS — N39498 Other specified urinary incontinence: Secondary | ICD-10-CM | POA: Diagnosis not present

## 2013-01-15 DIAGNOSIS — H6120 Impacted cerumen, unspecified ear: Secondary | ICD-10-CM | POA: Diagnosis not present

## 2013-01-29 DIAGNOSIS — Z5181 Encounter for therapeutic drug level monitoring: Secondary | ICD-10-CM | POA: Diagnosis not present

## 2013-01-29 DIAGNOSIS — Z7901 Long term (current) use of anticoagulants: Secondary | ICD-10-CM | POA: Diagnosis not present

## 2013-02-20 DIAGNOSIS — Z5181 Encounter for therapeutic drug level monitoring: Secondary | ICD-10-CM | POA: Diagnosis not present

## 2013-02-20 DIAGNOSIS — Z7901 Long term (current) use of anticoagulants: Secondary | ICD-10-CM | POA: Diagnosis not present

## 2013-02-21 DIAGNOSIS — I059 Rheumatic mitral valve disease, unspecified: Secondary | ICD-10-CM | POA: Diagnosis not present

## 2013-02-21 DIAGNOSIS — I10 Essential (primary) hypertension: Secondary | ICD-10-CM | POA: Diagnosis not present

## 2013-02-21 DIAGNOSIS — I4891 Unspecified atrial fibrillation: Secondary | ICD-10-CM | POA: Diagnosis not present

## 2013-02-21 DIAGNOSIS — E7800 Pure hypercholesterolemia, unspecified: Secondary | ICD-10-CM | POA: Diagnosis not present

## 2013-05-14 DIAGNOSIS — M25559 Pain in unspecified hip: Secondary | ICD-10-CM | POA: Diagnosis not present

## 2013-05-14 DIAGNOSIS — M79609 Pain in unspecified limb: Secondary | ICD-10-CM | POA: Diagnosis not present

## 2013-05-14 DIAGNOSIS — I959 Hypotension, unspecified: Secondary | ICD-10-CM | POA: Diagnosis not present

## 2013-05-14 DIAGNOSIS — G319 Degenerative disease of nervous system, unspecified: Secondary | ICD-10-CM | POA: Diagnosis not present

## 2013-05-14 DIAGNOSIS — E860 Dehydration: Secondary | ICD-10-CM | POA: Diagnosis present

## 2013-05-14 DIAGNOSIS — F329 Major depressive disorder, single episode, unspecified: Secondary | ICD-10-CM | POA: Diagnosis present

## 2013-05-14 DIAGNOSIS — G8929 Other chronic pain: Secondary | ICD-10-CM | POA: Diagnosis present

## 2013-05-14 DIAGNOSIS — Z7901 Long term (current) use of anticoagulants: Secondary | ICD-10-CM | POA: Diagnosis not present

## 2013-05-14 DIAGNOSIS — I10 Essential (primary) hypertension: Secondary | ICD-10-CM | POA: Diagnosis not present

## 2013-05-14 DIAGNOSIS — W19XXXA Unspecified fall, initial encounter: Secondary | ICD-10-CM | POA: Diagnosis not present

## 2013-05-14 DIAGNOSIS — S42309A Unspecified fracture of shaft of humerus, unspecified arm, initial encounter for closed fracture: Secondary | ICD-10-CM | POA: Diagnosis not present

## 2013-05-14 DIAGNOSIS — S42213A Unspecified displaced fracture of surgical neck of unspecified humerus, initial encounter for closed fracture: Secondary | ICD-10-CM | POA: Diagnosis not present

## 2013-05-14 DIAGNOSIS — S022XXA Fracture of nasal bones, initial encounter for closed fracture: Secondary | ICD-10-CM | POA: Diagnosis not present

## 2013-05-14 DIAGNOSIS — IMO0002 Reserved for concepts with insufficient information to code with codable children: Secondary | ICD-10-CM | POA: Diagnosis not present

## 2013-05-14 DIAGNOSIS — I6789 Other cerebrovascular disease: Secondary | ICD-10-CM | POA: Diagnosis not present

## 2013-05-14 DIAGNOSIS — S46909A Unspecified injury of unspecified muscle, fascia and tendon at shoulder and upper arm level, unspecified arm, initial encounter: Secondary | ICD-10-CM | POA: Diagnosis not present

## 2013-05-14 DIAGNOSIS — I509 Heart failure, unspecified: Secondary | ICD-10-CM | POA: Diagnosis present

## 2013-05-14 DIAGNOSIS — F411 Generalized anxiety disorder: Secondary | ICD-10-CM | POA: Diagnosis present

## 2013-05-14 DIAGNOSIS — G8911 Acute pain due to trauma: Secondary | ICD-10-CM | POA: Diagnosis not present

## 2013-05-14 DIAGNOSIS — R110 Nausea: Secondary | ICD-10-CM | POA: Diagnosis not present

## 2013-05-14 DIAGNOSIS — I4891 Unspecified atrial fibrillation: Secondary | ICD-10-CM | POA: Diagnosis not present

## 2013-05-14 DIAGNOSIS — R079 Chest pain, unspecified: Secondary | ICD-10-CM | POA: Diagnosis not present

## 2013-05-14 DIAGNOSIS — M479 Spondylosis, unspecified: Secondary | ICD-10-CM | POA: Diagnosis present

## 2013-05-14 DIAGNOSIS — Z79899 Other long term (current) drug therapy: Secondary | ICD-10-CM | POA: Diagnosis not present

## 2013-05-14 DIAGNOSIS — S4980XA Other specified injuries of shoulder and upper arm, unspecified arm, initial encounter: Secondary | ICD-10-CM | POA: Diagnosis not present

## 2013-05-14 DIAGNOSIS — M25519 Pain in unspecified shoulder: Secondary | ICD-10-CM | POA: Diagnosis present

## 2013-05-14 DIAGNOSIS — E785 Hyperlipidemia, unspecified: Secondary | ICD-10-CM | POA: Diagnosis present

## 2013-05-15 DIAGNOSIS — I4891 Unspecified atrial fibrillation: Secondary | ICD-10-CM | POA: Diagnosis not present

## 2013-05-15 DIAGNOSIS — S42213A Unspecified displaced fracture of surgical neck of unspecified humerus, initial encounter for closed fracture: Secondary | ICD-10-CM | POA: Diagnosis not present

## 2013-05-15 DIAGNOSIS — M25519 Pain in unspecified shoulder: Secondary | ICD-10-CM | POA: Diagnosis not present

## 2013-05-15 DIAGNOSIS — W19XXXA Unspecified fall, initial encounter: Secondary | ICD-10-CM | POA: Diagnosis not present

## 2013-05-17 DIAGNOSIS — F329 Major depressive disorder, single episode, unspecified: Secondary | ICD-10-CM | POA: Diagnosis present

## 2013-05-17 DIAGNOSIS — R269 Unspecified abnormalities of gait and mobility: Secondary | ICD-10-CM | POA: Diagnosis present

## 2013-05-17 DIAGNOSIS — M79609 Pain in unspecified limb: Secondary | ICD-10-CM | POA: Diagnosis present

## 2013-05-17 DIAGNOSIS — Z79899 Other long term (current) drug therapy: Secondary | ICD-10-CM | POA: Diagnosis not present

## 2013-05-17 DIAGNOSIS — Z602 Problems related to living alone: Secondary | ICD-10-CM | POA: Diagnosis not present

## 2013-05-17 DIAGNOSIS — K649 Unspecified hemorrhoids: Secondary | ICD-10-CM | POA: Diagnosis not present

## 2013-05-17 DIAGNOSIS — IMO0001 Reserved for inherently not codable concepts without codable children: Secondary | ICD-10-CM | POA: Diagnosis not present

## 2013-05-17 DIAGNOSIS — Z5189 Encounter for other specified aftercare: Secondary | ICD-10-CM | POA: Diagnosis not present

## 2013-05-17 DIAGNOSIS — Z882 Allergy status to sulfonamides status: Secondary | ICD-10-CM | POA: Diagnosis not present

## 2013-05-17 DIAGNOSIS — I509 Heart failure, unspecified: Secondary | ICD-10-CM | POA: Diagnosis not present

## 2013-05-17 DIAGNOSIS — Z9181 History of falling: Secondary | ICD-10-CM | POA: Diagnosis not present

## 2013-05-17 DIAGNOSIS — Z885 Allergy status to narcotic agent status: Secondary | ICD-10-CM | POA: Diagnosis not present

## 2013-05-17 DIAGNOSIS — K5900 Constipation, unspecified: Secondary | ICD-10-CM | POA: Diagnosis present

## 2013-05-17 DIAGNOSIS — I10 Essential (primary) hypertension: Secondary | ICD-10-CM | POA: Diagnosis not present

## 2013-05-17 DIAGNOSIS — Z883 Allergy status to other anti-infective agents status: Secondary | ICD-10-CM | POA: Diagnosis not present

## 2013-05-17 DIAGNOSIS — I959 Hypotension, unspecified: Secondary | ICD-10-CM | POA: Diagnosis present

## 2013-05-17 DIAGNOSIS — N390 Urinary tract infection, site not specified: Secondary | ICD-10-CM | POA: Diagnosis present

## 2013-05-17 DIAGNOSIS — Z7901 Long term (current) use of anticoagulants: Secondary | ICD-10-CM | POA: Diagnosis not present

## 2013-05-17 DIAGNOSIS — E860 Dehydration: Secondary | ICD-10-CM | POA: Diagnosis present

## 2013-05-17 DIAGNOSIS — S42309A Unspecified fracture of shaft of humerus, unspecified arm, initial encounter for closed fracture: Secondary | ICD-10-CM | POA: Diagnosis not present

## 2013-05-17 DIAGNOSIS — I4891 Unspecified atrial fibrillation: Secondary | ICD-10-CM | POA: Diagnosis present

## 2013-05-17 DIAGNOSIS — S42309D Unspecified fracture of shaft of humerus, unspecified arm, subsequent encounter for fracture with routine healing: Secondary | ICD-10-CM | POA: Diagnosis not present

## 2013-06-01 DIAGNOSIS — I4891 Unspecified atrial fibrillation: Secondary | ICD-10-CM | POA: Diagnosis not present

## 2013-06-06 DIAGNOSIS — S42309D Unspecified fracture of shaft of humerus, unspecified arm, subsequent encounter for fracture with routine healing: Secondary | ICD-10-CM | POA: Diagnosis not present

## 2013-06-06 DIAGNOSIS — H6120 Impacted cerumen, unspecified ear: Secondary | ICD-10-CM | POA: Diagnosis not present

## 2013-06-07 DIAGNOSIS — S42309A Unspecified fracture of shaft of humerus, unspecified arm, initial encounter for closed fracture: Secondary | ICD-10-CM | POA: Diagnosis not present

## 2013-06-09 DIAGNOSIS — S42293A Other displaced fracture of upper end of unspecified humerus, initial encounter for closed fracture: Secondary | ICD-10-CM | POA: Diagnosis not present

## 2013-06-09 DIAGNOSIS — S42213A Unspecified displaced fracture of surgical neck of unspecified humerus, initial encounter for closed fracture: Secondary | ICD-10-CM | POA: Diagnosis not present

## 2013-06-12 DIAGNOSIS — I4891 Unspecified atrial fibrillation: Secondary | ICD-10-CM | POA: Diagnosis not present

## 2013-06-12 DIAGNOSIS — E7800 Pure hypercholesterolemia, unspecified: Secondary | ICD-10-CM | POA: Diagnosis not present

## 2013-06-12 DIAGNOSIS — I059 Rheumatic mitral valve disease, unspecified: Secondary | ICD-10-CM | POA: Diagnosis not present

## 2013-06-12 DIAGNOSIS — I10 Essential (primary) hypertension: Secondary | ICD-10-CM | POA: Diagnosis not present

## 2013-06-13 DIAGNOSIS — E7800 Pure hypercholesterolemia, unspecified: Secondary | ICD-10-CM | POA: Diagnosis not present

## 2013-06-13 DIAGNOSIS — I4891 Unspecified atrial fibrillation: Secondary | ICD-10-CM | POA: Diagnosis not present

## 2013-06-13 DIAGNOSIS — S42309A Unspecified fracture of shaft of humerus, unspecified arm, initial encounter for closed fracture: Secondary | ICD-10-CM | POA: Diagnosis not present

## 2013-06-13 DIAGNOSIS — I10 Essential (primary) hypertension: Secondary | ICD-10-CM | POA: Diagnosis not present

## 2013-06-13 DIAGNOSIS — Z7901 Long term (current) use of anticoagulants: Secondary | ICD-10-CM | POA: Diagnosis not present

## 2013-06-13 DIAGNOSIS — Z0183 Encounter for blood typing: Secondary | ICD-10-CM | POA: Diagnosis not present

## 2013-06-13 DIAGNOSIS — Z01812 Encounter for preprocedural laboratory examination: Secondary | ICD-10-CM | POA: Diagnosis not present

## 2013-06-13 DIAGNOSIS — G4730 Sleep apnea, unspecified: Secondary | ICD-10-CM | POA: Diagnosis not present

## 2013-06-15 DIAGNOSIS — S4980XA Other specified injuries of shoulder and upper arm, unspecified arm, initial encounter: Secondary | ICD-10-CM | POA: Diagnosis not present

## 2013-06-15 DIAGNOSIS — Z471 Aftercare following joint replacement surgery: Secondary | ICD-10-CM | POA: Diagnosis not present

## 2013-06-15 DIAGNOSIS — S42209A Unspecified fracture of upper end of unspecified humerus, initial encounter for closed fracture: Secondary | ICD-10-CM | POA: Diagnosis not present

## 2013-06-15 DIAGNOSIS — F411 Generalized anxiety disorder: Secondary | ICD-10-CM | POA: Diagnosis present

## 2013-06-15 DIAGNOSIS — I4891 Unspecified atrial fibrillation: Secondary | ICD-10-CM | POA: Diagnosis present

## 2013-06-15 DIAGNOSIS — S42213A Unspecified displaced fracture of surgical neck of unspecified humerus, initial encounter for closed fracture: Secondary | ICD-10-CM | POA: Diagnosis not present

## 2013-06-15 DIAGNOSIS — Z881 Allergy status to other antibiotic agents status: Secondary | ICD-10-CM | POA: Diagnosis not present

## 2013-06-15 DIAGNOSIS — Z885 Allergy status to narcotic agent status: Secondary | ICD-10-CM | POA: Diagnosis not present

## 2013-06-15 DIAGNOSIS — M25519 Pain in unspecified shoulder: Secondary | ICD-10-CM | POA: Diagnosis not present

## 2013-06-15 DIAGNOSIS — G8918 Other acute postprocedural pain: Secondary | ICD-10-CM | POA: Diagnosis not present

## 2013-06-15 DIAGNOSIS — I10 Essential (primary) hypertension: Secondary | ICD-10-CM | POA: Diagnosis present

## 2013-06-15 DIAGNOSIS — S42309A Unspecified fracture of shaft of humerus, unspecified arm, initial encounter for closed fracture: Secondary | ICD-10-CM | POA: Diagnosis not present

## 2013-06-15 DIAGNOSIS — Z882 Allergy status to sulfonamides status: Secondary | ICD-10-CM | POA: Diagnosis not present

## 2013-06-15 DIAGNOSIS — Z96619 Presence of unspecified artificial shoulder joint: Secondary | ICD-10-CM | POA: Diagnosis not present

## 2013-06-15 DIAGNOSIS — G4730 Sleep apnea, unspecified: Secondary | ICD-10-CM | POA: Diagnosis present

## 2013-06-15 DIAGNOSIS — E7800 Pure hypercholesterolemia, unspecified: Secondary | ICD-10-CM | POA: Diagnosis not present

## 2013-06-15 DIAGNOSIS — G5790 Unspecified mononeuropathy of unspecified lower limb: Secondary | ICD-10-CM | POA: Diagnosis present

## 2013-06-15 DIAGNOSIS — M19029 Primary osteoarthritis, unspecified elbow: Secondary | ICD-10-CM | POA: Diagnosis not present

## 2013-06-15 DIAGNOSIS — F329 Major depressive disorder, single episode, unspecified: Secondary | ICD-10-CM | POA: Diagnosis present

## 2013-06-15 DIAGNOSIS — M810 Age-related osteoporosis without current pathological fracture: Secondary | ICD-10-CM | POA: Diagnosis present

## 2013-06-15 DIAGNOSIS — IMO0002 Reserved for concepts with insufficient information to code with codable children: Secondary | ICD-10-CM | POA: Diagnosis present

## 2013-06-15 DIAGNOSIS — M545 Low back pain: Secondary | ICD-10-CM | POA: Diagnosis present

## 2013-06-22 DIAGNOSIS — S42309A Unspecified fracture of shaft of humerus, unspecified arm, initial encounter for closed fracture: Secondary | ICD-10-CM | POA: Diagnosis not present

## 2013-06-25 DIAGNOSIS — I4891 Unspecified atrial fibrillation: Secondary | ICD-10-CM | POA: Diagnosis not present

## 2013-06-25 DIAGNOSIS — S42309A Unspecified fracture of shaft of humerus, unspecified arm, initial encounter for closed fracture: Secondary | ICD-10-CM | POA: Diagnosis not present

## 2013-06-27 DIAGNOSIS — I4891 Unspecified atrial fibrillation: Secondary | ICD-10-CM | POA: Diagnosis not present

## 2013-07-03 DIAGNOSIS — Z09 Encounter for follow-up examination after completed treatment for conditions other than malignant neoplasm: Secondary | ICD-10-CM | POA: Diagnosis not present

## 2013-07-03 DIAGNOSIS — S42309A Unspecified fracture of shaft of humerus, unspecified arm, initial encounter for closed fracture: Secondary | ICD-10-CM | POA: Diagnosis not present

## 2013-07-03 DIAGNOSIS — I4891 Unspecified atrial fibrillation: Secondary | ICD-10-CM | POA: Diagnosis not present

## 2013-07-04 ENCOUNTER — Telehealth: Payer: Self-pay | Admitting: Family Medicine

## 2013-07-04 NOTE — Telephone Encounter (Signed)
i would set up with Dr Teressa Senter. I'm not sure if he does that surgery but if he doesn't he will know who will.

## 2013-07-04 NOTE — Telephone Encounter (Signed)
No preference, the [place] doctor just wants to make sure that she goes to a surgeon that does the reverse total arthoplasty, because some sports medicine doctors do not do that type of surgery

## 2013-07-04 NOTE — Telephone Encounter (Signed)
i am happy to make ortho referral.  Do they have preference?

## 2013-07-04 NOTE — Telephone Encounter (Signed)
Pt daughter is call requesting a referral to ortho surgeon for right shoulder reverse total arthroplasty and referral for physical therapy for shoulder and balance issues. Pt in [place] will be home next wk. Pt saw MD at [facility] in [place]. The office notes from [facility] in Dr Caryl Never folder

## 2013-07-10 ENCOUNTER — Telehealth: Payer: Self-pay | Admitting: Family Medicine

## 2013-07-10 DIAGNOSIS — M25511 Pain in right shoulder: Secondary | ICD-10-CM

## 2013-07-10 NOTE — Telephone Encounter (Signed)
Pt daughter called to check status on referral for mother. Pt daughter called stating that she spoke with you  Last week in regard to her mother getting a referral for follow up care on her right shoulder reverse total arthroplasty she had. It looks like Dr. Karen Kitchens gave it the ok on the 8th for her to see Dr. Teressa Senter, however no referral had been please. Please advise

## 2013-07-11 NOTE — Telephone Encounter (Signed)
Informed [name] the patients daughter

## 2013-07-11 NOTE — Telephone Encounter (Signed)
I have made referral

## 2013-07-17 DIAGNOSIS — I4891 Unspecified atrial fibrillation: Secondary | ICD-10-CM | POA: Diagnosis not present

## 2013-07-25 DIAGNOSIS — S42209A Unspecified fracture of upper end of unspecified humerus, initial encounter for closed fracture: Secondary | ICD-10-CM | POA: Diagnosis not present

## 2013-07-26 DIAGNOSIS — J069 Acute upper respiratory infection, unspecified: Secondary | ICD-10-CM | POA: Diagnosis not present

## 2013-07-27 ENCOUNTER — Ambulatory Visit (INDEPENDENT_AMBULATORY_CARE_PROVIDER_SITE_OTHER): Payer: Medicare Other | Admitting: Internal Medicine

## 2013-07-27 ENCOUNTER — Encounter: Payer: Self-pay | Admitting: Internal Medicine

## 2013-07-27 ENCOUNTER — Ambulatory Visit (INDEPENDENT_AMBULATORY_CARE_PROVIDER_SITE_OTHER)
Admission: RE | Admit: 2013-07-27 | Discharge: 2013-07-27 | Disposition: A | Discharge status: Home / Self Care | Payer: Medicare Other | Source: Ambulatory Visit | Attending: Internal Medicine | Admitting: Internal Medicine

## 2013-07-27 VITALS — BP 110/64 | HR 107 | Temp 98.9°F | Wt 186.0 lb

## 2013-07-27 DIAGNOSIS — J40 Bronchitis, not specified as acute or chronic: Secondary | ICD-10-CM | POA: Diagnosis not present

## 2013-07-27 DIAGNOSIS — J069 Acute upper respiratory infection, unspecified: Secondary | ICD-10-CM

## 2013-07-27 DIAGNOSIS — Z7901 Long term (current) use of anticoagulants: Secondary | ICD-10-CM | POA: Diagnosis not present

## 2013-07-27 MED ORDER — AZITHROMYCIN 250 MG PO TABS: 250.0000 mg | ORAL_TABLET | ORAL | Status: DC

## 2013-07-27 MED ORDER — HYDROCODONE-HOMATROPINE 5-1.5 MG/5ML PO SYRP: 5.0000 mL | ORAL_SOLUTION | Freq: Four times a day (QID) | ORAL | Status: DC | PRN

## 2013-07-27 NOTE — Patient Instructions (Signed)
Get x ray today and will notify you about plan for antibiotic . Cough med for compfort  Fu if  persistent or progressive  Either way should be better next week.

## 2013-07-27 NOTE — Progress Notes (Signed)
Chief Complaint  Patient presents with  . Cough    Was seen at an urgent care.  Was told that she needed chest x=ray and to proceed to [facility] urgent care.   Decided to wait and be seen in the office today.  . Nasal Congestion    HPI: Patient comes in today for SDA for  new problem evaluation. PCP NA [facility]  To see if could get cough medicine last night and heard abnormal  Sounds and was told had abnormal lung exam . Started 5 day ago.  Cough  And malaise chills  No lung disease per see  By hx .  Lives in [place]   And is moves here  for winter.  Slight fever 99.9   Cough some up.  Clear foamy from nose  Yellow.  No sob.  Exposed to young children with rep infections  Not that sick  ROS: See pertinent positives and negatives per HPI. Had arm fracture and had shoulder surgery revision  Arm in sling right  Past Medical History  Diagnosis Date  . Arthritis   . Persistent atrial fibrillation   . Renal calculi   . Hypertension   . Hyperlipidemia   . Rheumatic fever     age 55  . History of blood transfusion   . Incontinence of urine   . Diastolic dysfunction     preserved EF,  CHF In setting of afib previously  . Osteoporosis   . OSA (obstructive sleep apnea)     uses CPAP  . Restless legs     Family History  Problem Relation Age of Onset  . Cancer Mother     breast  . Diabetes Mother   . Cancer Paternal Grandfather     History   Social History  . Marital Status: Divorced    Spouse Name: N/A    Number of Children: N/A  . Years of Education: N/A   Social History Main Topics  . Smoking status: Never Smoker   . Smokeless tobacco: None  . Alcohol Use: Yes     Comment: glass of wine each day  . Drug Use: No  . Sexual Activity: None   Other Topics Concern  . None   Social History Narrative   Lives in [place] alone.  Spends 4-6 months per year in [place] with her daughter.   Retired [employer] Services operational officer    Outpatient Encounter Prescriptions as of  07/27/2013  Medication Sig  . acetaminophen (TYLENOL) 650 MG CR tablet Take 650 mg by mouth every 8 (eight) hours as needed.  . diltiazem (CARDIZEM CD) 240 MG 24 hr capsule Take 240 mg by mouth daily.  Marland Kitchen escitalopram (LEXAPRO) 10 MG tablet Take 10 mg by mouth daily.  . famotidine (PEPCID) 20 MG tablet Take 20 mg by mouth 2 (two) times daily.  Marland Kitchen HYDROcodone-acetaminophen (NORCO) 7.5-325 MG per tablet Take 1 tablet by mouth every 6 (six) hours as needed for pain.  Marland Kitchen lisinopril (PRINIVIL,ZESTRIL) 10 MG tablet Take 10 mg by mouth daily.  Marland Kitchen oxybutynin (DITROPAN-XL) 10 MG 24 hr tablet Take 10 mg by mouth daily.  Marland Kitchen rOPINIRole (REQUIP) 1 MG tablet Take 1 tablet (1 mg total) by mouth 2 (two) times daily.  . rosuvastatin (CRESTOR) 40 MG tablet Take 40 mg by mouth daily.  Marland Kitchen warfarin (COUMADIN) 2 MG tablet Take 2 mg by mouth as directed. 2.5 mg on Sun and Wed.  5 mg all other days  . azithromycin [facility]  Z-PAK) 250 MG tablet Take 1 tablet (250 mg total) by mouth as directed. Take 2 po first day, then 1 po qd  . HYDROcodone-homatropine (HYCODAN) 5-1.5 MG/5ML syrup Take 5 mLs by mouth every 6 (six) hours as needed for cough.  . [DISCONTINUED] amoxicillin (AMOXIL) 500 MG capsule Take 1 capsule (500 mg total) by mouth 3 (three) times daily.  . [DISCONTINUED] azithromycin (ZITHROMAX) 250 MG tablet 2 po today then one daily for 4 more days  . [DISCONTINUED] Cholecalciferol (VITAMIN D) 2000 UNITS tablet Take 2,000 Units by mouth daily.  . [DISCONTINUED] Coenzyme Q10 (CO Q-10) 300 MG CAPS Take by mouth daily.  . [DISCONTINUED] cyanocobalamin 1000 MCG tablet Take 100 mcg by mouth daily.  . [DISCONTINUED] diphenhydrAMINE (SOMINEX) 25 MG tablet Take 25 mg by mouth at bedtime as needed.  . [DISCONTINUED] furosemide (LASIX) 40 MG tablet Take 40 mg by mouth daily.  . [DISCONTINUED] HYDROcodone-homatropine (HYCODAN) 5-1.5 MG/5ML syrup Take 5 mLs by mouth every 8 (eight) hours as needed for cough.  . [DISCONTINUED]  Krill Oil 300 MG CAPS Take by mouth daily.  . [DISCONTINUED] Magnesium 500 MG TABS Take by mouth daily.  . [DISCONTINUED] potassium chloride SA (K-DUR,KLOR-CON) 20 MEQ tablet Take 20 mEq by mouth daily.    EXAM:  BP 110/64  Pulse 107  Temp(Src) 98.9 F (37.2 C) (Oral)  Wt 186 lb (84.369 kg)  BMI 34.01 kg/m2  SpO2 96%  Body mass index is 34.01 kg/(m^2). Here with daughter  GENERAL: vitals reviewed and listed above, alert, oriented, appears well hydrated and in no acute distress right arm in sling   Deep  Bronchial cough  HEENT: atraumatic, conjunctiva  clear, no obvious abnormalities on inspection of external nose and earsright eac with wax  Left tm nl  OP : no lesion edema or exudate  NECK: no obvious masses on inspection palpation  No obv jvd  LUNGS:? CRACKLES LEFT BASE  Diffuse muscile sounds and rhonchi   Throughout  CV: HR?RR,? no clubbing cyanosis or nl cap refill  MS: moves all extremities without noticeable focal  abnormality PSYCH: pleasant and cooperative, no obvious depression or anxiety hard of hearing   ASSESSMENT AND PLAN:  Discussed the following assessment and plan:  Wheezy bronchitis - r/o air pace disease  empiric rx . impressive lung exam.  - Plan: DG Chest 2 View  Acute upper respiratory infections of unspecified site - Plan: HYDROcodone-homatropine (HYCODAN) 5-1.5 MG/5ML syrup, DG Chest 2 View  -Patient advised to return or notify health care team  if symptoms worsen or persist or new concerns arise.  Patient Instructions  Get x ray today and will notify you about plan for antibiotic . Cough med for compfort  Fu if  persistent or progressive  Either way should be better next week.   Neta Mends. Lilo Wallington M.D.

## 2013-07-30 DIAGNOSIS — M25519 Pain in unspecified shoulder: Secondary | ICD-10-CM | POA: Diagnosis not present

## 2013-07-30 DIAGNOSIS — M6281 Muscle weakness (generalized): Secondary | ICD-10-CM | POA: Diagnosis not present

## 2013-07-31 DIAGNOSIS — R5381 Other malaise: Secondary | ICD-10-CM | POA: Diagnosis not present

## 2013-07-31 DIAGNOSIS — M810 Age-related osteoporosis without current pathological fracture: Secondary | ICD-10-CM | POA: Diagnosis not present

## 2013-07-31 DIAGNOSIS — I4891 Unspecified atrial fibrillation: Secondary | ICD-10-CM | POA: Diagnosis not present

## 2013-07-31 DIAGNOSIS — E559 Vitamin D deficiency, unspecified: Secondary | ICD-10-CM | POA: Diagnosis not present

## 2013-07-31 LAB — PROTIME-INR

## 2013-08-01 DIAGNOSIS — M6281 Muscle weakness (generalized): Secondary | ICD-10-CM | POA: Diagnosis not present

## 2013-08-01 DIAGNOSIS — M25519 Pain in unspecified shoulder: Secondary | ICD-10-CM | POA: Diagnosis not present

## 2013-08-03 DIAGNOSIS — M25519 Pain in unspecified shoulder: Secondary | ICD-10-CM | POA: Diagnosis not present

## 2013-08-03 DIAGNOSIS — M6281 Muscle weakness (generalized): Secondary | ICD-10-CM | POA: Diagnosis not present

## 2013-08-06 DIAGNOSIS — M25519 Pain in unspecified shoulder: Secondary | ICD-10-CM | POA: Diagnosis not present

## 2013-08-06 DIAGNOSIS — M6281 Muscle weakness (generalized): Secondary | ICD-10-CM | POA: Diagnosis not present

## 2013-08-07 ENCOUNTER — Ambulatory Visit (INDEPENDENT_AMBULATORY_CARE_PROVIDER_SITE_OTHER): Payer: Medicare Other | Admitting: Internal Medicine

## 2013-08-07 ENCOUNTER — Encounter: Payer: Self-pay | Admitting: Internal Medicine

## 2013-08-07 ENCOUNTER — Ambulatory Visit: Payer: Medicare Other | Admitting: Internal Medicine

## 2013-08-07 VITALS — BP 86/70 | HR 100 | Temp 97.8°F | Wt 185.0 lb

## 2013-08-07 DIAGNOSIS — R031 Nonspecific low blood-pressure reading: Secondary | ICD-10-CM | POA: Insufficient documentation

## 2013-08-07 DIAGNOSIS — J069 Acute upper respiratory infection, unspecified: Secondary | ICD-10-CM

## 2013-08-07 DIAGNOSIS — R05 Cough: Secondary | ICD-10-CM | POA: Insufficient documentation

## 2013-08-07 DIAGNOSIS — Z7901 Long term (current) use of anticoagulants: Secondary | ICD-10-CM

## 2013-08-07 DIAGNOSIS — Z780 Asymptomatic menopausal state: Secondary | ICD-10-CM | POA: Diagnosis not present

## 2013-08-07 DIAGNOSIS — H6121 Impacted cerumen, right ear: Secondary | ICD-10-CM

## 2013-08-07 DIAGNOSIS — I4891 Unspecified atrial fibrillation: Secondary | ICD-10-CM

## 2013-08-07 DIAGNOSIS — Z23 Encounter for immunization: Secondary | ICD-10-CM

## 2013-08-07 DIAGNOSIS — H6120 Impacted cerumen, unspecified ear: Secondary | ICD-10-CM | POA: Diagnosis not present

## 2013-08-07 DIAGNOSIS — J40 Bronchitis, not specified as acute or chronic: Secondary | ICD-10-CM

## 2013-08-07 DIAGNOSIS — M25519 Pain in unspecified shoulder: Secondary | ICD-10-CM | POA: Diagnosis not present

## 2013-08-07 DIAGNOSIS — M6281 Muscle weakness (generalized): Secondary | ICD-10-CM | POA: Diagnosis not present

## 2013-08-07 MED ORDER — PREDNISONE 20 MG PO TABS: ORAL_TABLET | ORAL | Status: DC

## 2013-08-07 MED ORDER — HYDROCODONE-HOMATROPINE 5-1.5 MG/5ML PO SYRP: 5.0000 mL | ORAL_SOLUTION | Freq: Four times a day (QID) | ORAL | Status: DC | PRN

## 2013-08-07 NOTE — Patient Instructions (Addendum)
Blood pressure is low  So for  now stoip the lisinopril  . Rov dr Elvina Mattes in 3 weeks or so Prednisone  To help resolve the cough . prob dx of copd underlying this prolonged infection sx

## 2013-08-07 NOTE — Progress Notes (Signed)
Chief Complaint  Patient presents with  . Cough    Cough is productive of a clear phlegm.  Pt has pain in her chest/lungs.  Last x-ray was normal.  Feels like she has fluid in her rt ear.    HPI: Patient comes in today for SDA for  ongogin problem evaluation.  Since her last visit there is been some improvement but still coughing. Cough is not as constant  Frequent but not as constant  And soreness .  No fever. No syncope dizziness. Feels like she has fluid in her right ear. No pain  Blood pressure still runs low she is only on 180 of the diltiazem . and 5 mg of the lisinopril. She is on this for atrial fibrillation sees Dr. Johney Frame ROS: See pertinent positives and negatives per HPI. No fever chest pain hemoptysis. Has been on her other medicines for a while. No unusual bleeding.  Past Medical History  Diagnosis Date  . Arthritis   . Persistent atrial fibrillation   . Renal calculi   . Hypertension   . Hyperlipidemia   . Rheumatic fever     age 44  . History of blood transfusion   . Incontinence of urine   . Diastolic dysfunction     preserved EF,  CHF In setting of afib previously  . Osteoporosis   . OSA (obstructive sleep apnea)     uses CPAP  . Restless legs     Family History  Problem Relation Age of Onset  . Cancer Mother     breast  . Diabetes Mother   . Cancer Paternal Grandfather     History   Social History  . Marital Status: Divorced    Spouse Name: N/A    Number of Children: N/A  . Years of Education: N/A   Social History Main Topics  . Smoking status: Never Smoker   . Smokeless tobacco: None  . Alcohol Use: Yes     Comment: glass of wine each day  . Drug Use: No  . Sexual Activity: None   Other Topics Concern  . None   Social History Narrative   Lives in [place] alone.  Spends 4-6 months per year in [place] with her daughter.   Retired Dipl[employer] Services operational officer    Outpatient Encounter Prescriptions as of 08/07/2013  Medication Sig  . acetaminophen  (TYLENOL) 650 MG CR tablet Take 650 mg by mouth every 8 (eight) hours as needed.  . diltiazem (CARDIZEM CD) 240 MG 24 hr capsule Take 240 mg by mouth daily. Says only taking 180 mg  . escitalopram (LEXAPRO) 10 MG tablet Take 10 mg by mouth daily.  . famotidine (PEPCID) 20 MG tablet Take 20 mg by mouth 2 (two) times daily.  Marland Kitchen HYDROcodone-acetaminophen (NORCO) 7.5-325 MG per tablet Take 1 tablet by mouth every 6 (six) hours as needed for pain.  Marland Kitchen HYDROcodone-homatropine (HYCODAN) 5-1.5 MG/5ML syrup Take 5 mLs by mouth every 6 (six) hours as needed for cough.  Marland Kitchen oxybutynin (DITROPAN-XL) 10 MG 24 hr tablet Take 10 mg by mouth daily.  Marland Kitchen rOPINIRole (REQUIP) 1 MG tablet Take 1 tablet (1 mg total) by mouth 2 (two) times daily.  . rosuvastatin (CRESTOR) 40 MG tablet Take 40 mg by mouth daily.  Marland Kitchen warfarin (COUMADIN) 2 MG tablet Take 2 mg by mouth as directed. 2.5 mg on Sun and Wed.  5 mg all other days  . [DISCONTINUED] HYDROcodone-homatropine (HYCODAN) 5-1.5 MG/5ML syrup Take 5 mLs by mouth every 6 (six)  hours as needed for cough.  . [DISCONTINUED] lisinopril (PRINIVIL,ZESTRIL) 10 MG tablet Take 10 mg by mouth daily.  . predniSONE (DELTASONE) 20 MG tablet Take 3 po qd for 2 days then 2 po qd for 3 days,or as directed  . [DISCONTINUED] azithromycin (ZITHROMAX Z-PAK) 250 MG tablet Take 1 tablet (250 mg total) by mouth as directed. Take 2 po first day, then 1 po qd    EXAM:  BP 86/70  Pulse 100  Temp(Src) 97.8 F (36.6 C) (Oral)  Wt 185 lb (83.915 kg)  SpO2 96%  Body mass index is 33.83 kg/(m^2).  GENERAL: vitals reviewed and listed above, alert, oriented, appears well hydrated and in no acute distress Looks better than last visit. Occasional cough HEENT: atraumatic, conjunctiva  clear, no obvious abnormalities on inspection of external nose and ears right EAC has a small amount of wax noted after irrigation the was only partly successful there was water in her ear and TM appeared gray in color but  not fully visualized OP : no lesion edema or exudate mild congestion NECK: no obvious masses on inspection palpation no JVD LUNGS: clear to auscultation bilaterally, no wheezes, rales or rhonchi, good air movement occasional dry cough CV: HIRRR, no clubbing cyanosis o nl cap refill rate is about 90 MS: moves all extremities right arm still in a sling is ambulatory  PSYCH: pleasant and cooperative, no obvious depression or anxiety  ASSESSMENT AND PLAN:  Discussed the following assessment and plan:  Cough - persitietn  prob from copd  exam much better ! pred  and hold acei  Need for prophylactic vaccination and inoculation against influenza - Plan: Flu vaccine HIGH DOSE PF (Fluzone Tri High dose)  Acute upper respiratory infections of unspecified site - Plan: HYDROcodone-homatropine (HYCODAN) 5-1.5 MG/5ML syrup  Cerumen , right - Looks a bit better but patient can't hear procedure stopped try Debrox 3 night and recheck if needed  Low blood pressure reading - stop acei weill get message to dr Johney Frame and fduy with dr B  tolerating well   Atrial fibrillation  Wheezy bronchitis  Anticoagulant long-term use  Refill the medication is appropriate empiric treatment with prednisone. No further antibiotic needed. She processed likely has underlying COPD with residual cough and persistence also possibly from the ACE inhibitor.  Regard to her low blood pressure a suppose some could be slightly factitious if she has pulses paradoxic is in atrial fib least on the reading She looks well but her readings are quite low. We'll have her hold her lisinopril get message to her cardiologist and have her close followup with her primary doctor.  Ear symptoms hopefully will get better when he her dries out if not reassess treat as needed -Patient advised to return or notify health care team  if symptoms worsen or persist or new concerns arise.  Patient Instructions  Blood pressure is low  So for  now stoip  the lisinopril  . Rov dr Elvina Mattes in 3 weeks or so Prednisone  To help resolve the cough . prob dx of copd underlying this prolonged infection sx     Stevie Ertle K. Dallie Patton M.D.

## 2013-08-09 DIAGNOSIS — M25519 Pain in unspecified shoulder: Secondary | ICD-10-CM | POA: Diagnosis not present

## 2013-08-09 DIAGNOSIS — M6281 Muscle weakness (generalized): Secondary | ICD-10-CM | POA: Diagnosis not present

## 2013-08-16 DIAGNOSIS — M25559 Pain in unspecified hip: Secondary | ICD-10-CM | POA: Diagnosis not present

## 2013-08-16 DIAGNOSIS — M810 Age-related osteoporosis without current pathological fracture: Secondary | ICD-10-CM | POA: Diagnosis not present

## 2013-08-16 DIAGNOSIS — I4891 Unspecified atrial fibrillation: Secondary | ICD-10-CM | POA: Diagnosis not present

## 2013-08-16 LAB — PROTIME-INR

## 2013-08-21 ENCOUNTER — Encounter: Payer: Self-pay | Admitting: Family Medicine

## 2013-08-22 ENCOUNTER — Ambulatory Visit (INDEPENDENT_AMBULATORY_CARE_PROVIDER_SITE_OTHER): Payer: Medicare Other | Admitting: Family Medicine

## 2013-08-22 ENCOUNTER — Other Ambulatory Visit: Payer: Self-pay

## 2013-08-22 ENCOUNTER — Encounter: Payer: Self-pay | Admitting: Family Medicine

## 2013-08-22 VITALS — BP 112/70 | HR 104 | Temp 97.3°F | Wt 192.0 lb

## 2013-08-22 DIAGNOSIS — I4891 Unspecified atrial fibrillation: Secondary | ICD-10-CM

## 2013-08-22 DIAGNOSIS — I10 Essential (primary) hypertension: Secondary | ICD-10-CM

## 2013-08-22 DIAGNOSIS — R05 Cough: Secondary | ICD-10-CM

## 2013-08-22 DIAGNOSIS — R059 Cough, unspecified: Secondary | ICD-10-CM

## 2013-08-22 DIAGNOSIS — G2581 Restless legs syndrome: Secondary | ICD-10-CM

## 2013-08-22 MED ORDER — ROPINIROLE HCL 1 MG PO TABS: 1.0000 mg | ORAL_TABLET | Freq: Two times a day (BID) | ORAL | Status: DC

## 2013-08-22 MED ORDER — OXYBUTYNIN CHLORIDE ER 10 MG PO TB24: 10.0000 mg | ORAL_TABLET | Freq: Every day | ORAL | Status: DC

## 2013-08-22 MED ORDER — FUROSEMIDE 40 MG PO TABS: 40.0000 mg | ORAL_TABLET | Freq: Every day | ORAL | Status: DC

## 2013-08-22 MED ORDER — ESCITALOPRAM OXALATE 10 MG PO TABS: 10.0000 mg | ORAL_TABLET | Freq: Every day | ORAL | Status: DC

## 2013-08-22 MED ORDER — ROSUVASTATIN CALCIUM 40 MG PO TABS: 40.0000 mg | ORAL_TABLET | Freq: Every day | ORAL | Status: DC

## 2013-08-22 NOTE — Progress Notes (Signed)
Pre visit review using our clinic review tool, if applicable. No additional management support is needed unless otherwise documented below in the visit note. 

## 2013-08-22 NOTE — Patient Instructions (Addendum)
Elevated legs frequently.   Furosemide 40 mg daily for 3 days and take potasium supplement that Advair one puff twice daily and rinse mouth after use. Continue to hold Lisinopril for now

## 2013-08-22 NOTE — Progress Notes (Signed)
Subjective:    Patient ID: Angel Meyer, female    DOB: 10-22-1932, 79 y.o.   MRN: [id number]  HPI Patient is here visiting family. She spends most of her year in [place] and spends the winter months here. She has chronic hypertension, atrial fibrillation, hyperlipidemia, osteoporosis, chronic anticoagulant lose, shortness of breath, restless leg syndrome, and osteoarthritis. She takes several medications and needs refills. She had recent right shoulder surgery per orthopedics had multiple labs done then.  She is seen today with cough which has persisted for over one month. She's had similar type cough in the past. She was seen here and prescribed Zithromax initially and subsequent prednisone. Prednisone may have helped her cough somewhat. Cough productive of white sputum. She has some mild dyspnea with activity but no chest pains. She's had some intermittent ankle edema previously especially past few days. Recent chest x-ray showed possible COPD changes but no acute infiltrate or other acute findings. No acute pulmonary edema.  Patient nonsmoker. She's had some intermittent wheezing in the past. No active GERD symptoms. No postnasal drip symptoms. No recent appetite or weight changes.  Patient had been on low-dose lisinopril 5 mg daily but because of low blood pressure this has been held for the past couple weeks. Cough has not improved since holding ACE inhibitor  Past Medical History  Diagnosis Date  . Arthritis   . Persistent atrial fibrillation   . Renal calculi   . Hypertension   . Hyperlipidemia   . Rheumatic fever     age 19  . History of blood transfusion   . Incontinence of urine   . Diastolic dysfunction     preserved EF,  CHF In setting of afib previously  . Osteoporosis   . OSA (obstructive sleep apnea)     uses CPAP  . Restless legs    Past Surgical History  Procedure Laterality Date  . Breast surgery      benign biopsy  . Tonsillectomy    . Abdominal hysterectomy      . Spine surgery      laminectomy 1965, spinal fusion with rod 2005  . Replacement total knee  2005  . Kidney stone surgery      1997 removed    reports that she has never smoked. She does not have any smokeless tobacco history on file. She reports that she drinks alcohol. She reports that she does not use illicit drugs. family history includes Cancer in her mother and paternal grandfather; Diabetes in her mother. Allergies  Allergen Reactions  . Ciprofloxacin   . Demerol [Meperidine]   . Sulfa Antibiotics       Review of Systems  Constitutional: Negative for fever, chills, appetite change and unexpected weight change.  Respiratory: Positive for cough and shortness of breath.   Cardiovascular: Positive for leg swelling. Negative for chest pain and palpitations.  Gastrointestinal: Negative for abdominal pain.  Neurological: Negative for dizziness.       Objective:   Physical Exam  Constitutional: She is oriented to person, place, and time. She appears well-developed and well-nourished.  HENT:  Right Ear: External ear normal.  Left Ear: External ear normal.  Mouth/Throat: Oropharynx is clear and moist.  Neck: Neck supple.  Cardiovascular: Normal rate.   Pulmonary/Chest: Effort normal and breath sounds normal. No respiratory distress. She has no wheezes. She has no rales.  Musculoskeletal: She exhibits edema.  Patient 1+ nonpitting edema legs trace pitting edema feet bilaterally  Neurological: She is alert and  oriented to person, place, and time.          Assessment & Plan:  #1 chronic cough. Question reactive airway component. She did briefly improve with prednisone. Trial of Advair 250 mg one puff twice a day with samples given and rinse mouth after use and reassess 2 weeks. #2 hypertension stable. No recent orthostasis since stopping lisinopril #3 restless leg syndrome. Refill medication  #4 atrial fibrillation currently rate controlled. On coumadin.  Currently  monitored through her physician in [place] and she hopes to transition INRs here.

## 2013-09-04 ENCOUNTER — Telehealth: Payer: Self-pay | Admitting: Family Medicine

## 2013-09-04 NOTE — Telephone Encounter (Signed)
Patient Information:  Caller Name: Rosann Auerbach  Phone: [phone]  Patient: Angel, Meyer  Gender: Female  DOB: 1932/10/22  Age: 80 Years  PCP: Evelena Peat (Family Practice)  Office Follow Up:  Does the office need to follow up with this patient?: No  Instructions For The Office: N/A   Symptoms  Reason For Call & Symptoms: Pt had  physical therapy today at 1300 and HR now is  80-84, RR = 32.  She has an appt. 11/12/12 and daughter wats to see if that can be moved up.  Reviewed Health History In EMR: Yes  Reviewed Medications In EMR: Yes  Reviewed Allergies In EMR: Yes  Reviewed Surgeries / Procedures: Yes  Date of Onset of Symptoms: 09/04/2013  Guideline(s) Used:  Heart Rate and Heartbeat Questions  Disposition Per Guideline:   See Today in Office  Reason For Disposition Reached:   Age > 60 years  Advice Given:  N/A  RN Overrode Recommendation:  Follow Up With Office Later  Wants appt for 09/05/13.  Appt made for 09/05/13 at 1630 with Burchette.

## 2013-09-05 ENCOUNTER — Ambulatory Visit (INDEPENDENT_AMBULATORY_CARE_PROVIDER_SITE_OTHER): Payer: Medicare Other | Admitting: Family Medicine

## 2013-09-05 ENCOUNTER — Encounter: Payer: Self-pay | Admitting: Family Medicine

## 2013-09-05 VITALS — BP 110/80 | HR 106 | Temp 97.3°F | Wt 191.0 lb

## 2013-09-05 DIAGNOSIS — I4891 Unspecified atrial fibrillation: Secondary | ICD-10-CM

## 2013-09-05 DIAGNOSIS — R05 Cough: Secondary | ICD-10-CM | POA: Diagnosis not present

## 2013-09-05 DIAGNOSIS — R0609 Other forms of dyspnea: Secondary | ICD-10-CM | POA: Diagnosis not present

## 2013-09-05 DIAGNOSIS — R0600 Dyspnea, unspecified: Secondary | ICD-10-CM

## 2013-09-05 MED ORDER — CARVEDILOL 3.125 MG PO TABS: 3.1250 mg | ORAL_TABLET | Freq: Two times a day (BID) | ORAL | Status: DC

## 2013-09-05 NOTE — Patient Instructions (Signed)
Follow up promptly for any fever, increased shortness of breath, chest pain, or any other new symptoms.

## 2013-09-05 NOTE — Progress Notes (Signed)
Pre visit review using our clinic review tool, if applicable. No additional management support is needed unless otherwise documented below in the visit note. 

## 2013-09-05 NOTE — Progress Notes (Signed)
Subjective  Here for followup regarding persistent cough, dyspnea, peripheral edema, and elevated heart rate yesterday during physical therapy. Refer to recent note:  Patient is here visiting family. She spends most of her year in [place] and spends the winter months here. She has chronic hypertension, atrial fibrillation, hyperlipidemia, osteoporosis, chronic anticoagulant lose, shortness of breath, restless leg syndrome, and osteoarthritis. She takes several medications and needs refills. She had recent right shoulder surgery per orthopedics had multiple labs done then.  She is seen today with cough which has persisted for over one month. She's had similar type cough in the past. She was seen here and prescribed Zithromax initially and subsequent prednisone. Prednisone may have helped her cough somewhat. Cough productive of white sputum. She has some mild dyspnea with activity but no chest pains. She's had some intermittent ankle edema previously especially past few days. Recent chest x-ray showed possible COPD changes but no acute infiltrate or other acute findings. No acute pulmonary edema.  Patient nonsmoker. She's had some intermittent wheezing in the past. No active GERD symptoms. No postnasal drip symptoms. No recent appetite or weight changes.  Patient had been on low-dose lisinopril 5 mg daily but because of low blood pressure this has been held for the past couple weeks. Cough has not improved since holding ACE inhibitor   She had responded briefly to prednisone and we gave sample of Advair inhaler which did seem to help somewhat with her cough. Her weight is essentially unchanged since last visit. She has had some dyspnea at rest and also with activity. No chest pains. She had echocardiogram back in [place] June 2012 with ejection fraction 55%. History of diastolic dysfunction and apparently had admission for heart failure about 3 years ago. Intolerant of recent low-dose lisinopril with low  blood pressure. She does remain on diltiazem 180 mg once daily. She has atrial fibrillation history on Coumadin.  Patient previously was treated with beta blocker when she lived in [place] and they're not sure why she was taken off this. She does not have any history of asthma.  Her cough remains nonproductive. She's not had any fever.  Past Medical History  Diagnosis Date  . Arthritis   . Persistent atrial fibrillation   . Renal calculi   . Hypertension   . Hyperlipidemia   . Rheumatic fever     age 79  . History of blood transfusion   . Incontinence of urine   . Diastolic dysfunction     preserved EF,  CHF In setting of afib previously  . Osteoporosis   . OSA (obstructive sleep apnea)     uses CPAP  . Restless legs    Past Surgical History  Procedure Laterality Date  . Breast surgery      benign biopsy  . Tonsillectomy    . Abdominal hysterectomy    . Spine surgery      laminectomy 1965, spinal fusion with rod 2005  . Replacement total knee  2005  . Kidney stone surgery      1997 removed    reports that she has never smoked. She does not have any smokeless tobacco history on file. She reports that she drinks alcohol. She reports that she does not use illicit drugs. family history includes Cancer in her mother and paternal grandfather; Diabetes in her mother. Allergies  Allergen Reactions  . Ciprofloxacin   . Demerol [Meperidine]   . Sulfa Antibiotics    Objective:  Patient is alert and in no distress.  Her weight is 191 pounds which is essentially unchanged last visit Temperature 97.3 Pulses 120 and irregular Blood pressure 110/80 Pulse oximetry 94%  HEENT- Oropharynx is moist and clear. Neck no JVD Chest is clear to auscultation Heart irregular rhythm with rate around 120 Extremities reveal trace pitting edema legs bilaterally. Neuro reveals patient is alert. Cranial nerves II through XII intact. No focal strength deficits.  Assessment/plan:  Patient  presents with persistent cough and dyspnea. Recent chest x-ray no pneumonia and no obvious overt heart failure. Dyspnea likely related at least in part to atrial fibrillation with rate poorly control today. She is hemodynamically stable. Check labs with basic metabolic panel, BNP, TSH Start carvedilol 3.125 mg twice a day. Watch sodium intake. Set up cardiology referral. Pt has scheduled follow up next week and follow up sooner for any fever, chest pain, or increased dyspnea.

## 2013-09-06 ENCOUNTER — Other Ambulatory Visit (INDEPENDENT_AMBULATORY_CARE_PROVIDER_SITE_OTHER): Payer: Medicare Other

## 2013-09-06 DIAGNOSIS — R0609 Other forms of dyspnea: Secondary | ICD-10-CM

## 2013-09-06 DIAGNOSIS — R0600 Dyspnea, unspecified: Secondary | ICD-10-CM

## 2013-09-06 LAB — BASIC METABOLIC PANEL
BUN: 21 mg/dL (ref 6–23)
Chloride: 108 mEq/L (ref 96–112)
Creatinine, Ser: 0.9 mg/dL (ref 0.4–1.2)
GFR: 68.71 mL/min (ref >60.00)
Potassium: 3.9 mEq/L (ref 3.5–5.1)
Sodium: 140 mEq/L (ref 135–145)

## 2013-09-06 LAB — TSH: TSH: 0.78 u[IU]/mL (ref 0.35–5.50)

## 2013-09-06 NOTE — Addendum Note (Signed)
Addended by: Rita Ohara R on: 09/06/2013 12:00 PM   Modules accepted: Orders

## 2013-09-07 ENCOUNTER — Telehealth: Payer: Self-pay | Admitting: Internal Medicine

## 2013-09-07 NOTE — Telephone Encounter (Signed)
New message     Talk to nurse---concerned that her mother is in CHF and may need to be seen. She saw PCP yesterday and waiting on BNP results

## 2013-09-07 NOTE — Telephone Encounter (Signed)
Spoke with patient's daughter.  Patient has been following with Dr Caryl Never and he started her on Carvedilol 3.125mg  twice daily for afib and she is following up with him next week.  Her CXR was normal and labs looked good.  Dr Caryl Never feels this is all related to her afib.  I have scheduled her to follow up with Dr Johney Frame on 10/03/13 at 8:45am

## 2013-09-08 ENCOUNTER — Emergency Department (HOSPITAL_COMMUNITY): Payer: Medicare Other

## 2013-09-08 ENCOUNTER — Inpatient Hospital Stay (HOSPITAL_COMMUNITY)
Admission: EM | Admit: 2013-09-08 | Discharge: 2013-09-14 | DRG: 308 | Disposition: A | Discharge status: Home / Self Care | Payer: Medicare Other | Attending: Internal Medicine | Admitting: Internal Medicine

## 2013-09-08 ENCOUNTER — Encounter (HOSPITAL_COMMUNITY): Payer: Self-pay | Admitting: Emergency Medicine

## 2013-09-08 DIAGNOSIS — I517 Cardiomegaly: Secondary | ICD-10-CM | POA: Diagnosis not present

## 2013-09-08 DIAGNOSIS — G4733 Obstructive sleep apnea (adult) (pediatric): Secondary | ICD-10-CM | POA: Diagnosis present

## 2013-09-08 DIAGNOSIS — M129 Arthropathy, unspecified: Secondary | ICD-10-CM | POA: Diagnosis present

## 2013-09-08 DIAGNOSIS — M810 Age-related osteoporosis without current pathological fracture: Secondary | ICD-10-CM | POA: Diagnosis present

## 2013-09-08 DIAGNOSIS — R05 Cough: Secondary | ICD-10-CM | POA: Diagnosis not present

## 2013-09-08 DIAGNOSIS — I4891 Unspecified atrial fibrillation: Secondary | ICD-10-CM | POA: Diagnosis not present

## 2013-09-08 DIAGNOSIS — J811 Chronic pulmonary edema: Secondary | ICD-10-CM | POA: Diagnosis not present

## 2013-09-08 DIAGNOSIS — J069 Acute upper respiratory infection, unspecified: Secondary | ICD-10-CM

## 2013-09-08 DIAGNOSIS — Z7901 Long term (current) use of anticoagulants: Secondary | ICD-10-CM

## 2013-09-08 DIAGNOSIS — I4892 Unspecified atrial flutter: Secondary | ICD-10-CM | POA: Diagnosis not present

## 2013-09-08 DIAGNOSIS — Z87442 Personal history of urinary calculi: Secondary | ICD-10-CM | POA: Diagnosis not present

## 2013-09-08 DIAGNOSIS — I10 Essential (primary) hypertension: Secondary | ICD-10-CM | POA: Diagnosis not present

## 2013-09-08 DIAGNOSIS — J209 Acute bronchitis, unspecified: Secondary | ICD-10-CM | POA: Diagnosis present

## 2013-09-08 DIAGNOSIS — I5033 Acute on chronic diastolic (congestive) heart failure: Secondary | ICD-10-CM | POA: Diagnosis not present

## 2013-09-08 DIAGNOSIS — E785 Hyperlipidemia, unspecified: Secondary | ICD-10-CM | POA: Diagnosis present

## 2013-09-08 DIAGNOSIS — G2581 Restless legs syndrome: Secondary | ICD-10-CM | POA: Diagnosis not present

## 2013-09-08 DIAGNOSIS — I509 Heart failure, unspecified: Secondary | ICD-10-CM | POA: Diagnosis present

## 2013-09-08 DIAGNOSIS — J984 Other disorders of lung: Secondary | ICD-10-CM | POA: Diagnosis not present

## 2013-09-08 DIAGNOSIS — J45909 Unspecified asthma, uncomplicated: Secondary | ICD-10-CM | POA: Diagnosis present

## 2013-09-08 DIAGNOSIS — Z96659 Presence of unspecified artificial knee joint: Secondary | ICD-10-CM

## 2013-09-08 DIAGNOSIS — R911 Solitary pulmonary nodule: Secondary | ICD-10-CM | POA: Diagnosis not present

## 2013-09-08 DIAGNOSIS — Z9119 Patient's noncompliance with other medical treatment and regimen: Secondary | ICD-10-CM | POA: Diagnosis not present

## 2013-09-08 DIAGNOSIS — I5023 Acute on chronic systolic (congestive) heart failure: Secondary | ICD-10-CM | POA: Diagnosis present

## 2013-09-08 DIAGNOSIS — Z91199 Patient's noncompliance with other medical treatment and regimen due to unspecified reason: Secondary | ICD-10-CM

## 2013-09-08 DIAGNOSIS — I5021 Acute systolic (congestive) heart failure: Secondary | ICD-10-CM | POA: Diagnosis not present

## 2013-09-08 DIAGNOSIS — J90 Pleural effusion, not elsewhere classified: Secondary | ICD-10-CM | POA: Diagnosis not present

## 2013-09-08 LAB — CBC WITH DIFFERENTIAL/PLATELET
Basophils Absolute: 0 10*3/uL (ref 0.0–0.1)
Basophils Relative: 1 % (ref 0–1)
Eosinophils Absolute: 0 10*3/uL (ref 0.0–0.7)
Eosinophils Relative: 1 % (ref 0–5)
HCT: 33.7 % — ABNORMAL LOW (ref 36.0–46.0)
Hemoglobin: 11.7 g/dL — ABNORMAL LOW (ref 12.0–15.0)
Lymphocytes Relative: 19 % (ref 12–46)
Lymphs Abs: 0.8 10*3/uL (ref 0.7–4.0)
MCH: 31.5 pg (ref 26.0–34.0)
MCHC: 34.7 g/dL (ref 30.0–36.0)
MCV: 90.6 fL (ref 78.0–100.0)
Monocytes Absolute: 0.6 10*3/uL (ref 0.1–1.0)
Monocytes Relative: 15 % — ABNORMAL HIGH (ref 3–12)
Neutro Abs: 2.7 10*3/uL (ref 1.7–7.7)
Neutrophils Relative %: 64 % (ref 43–77)
Platelets: 150 10*3/uL (ref 150–400)
RBC: 3.72 MIL/uL — ABNORMAL LOW (ref 3.87–5.11)
RDW: 16.1 % — ABNORMAL HIGH (ref 11.5–15.5)
WBC: 4.2 10*3/uL (ref 4.0–10.5)

## 2013-09-08 LAB — BASIC METABOLIC PANEL
BUN: 20 mg/dL (ref 6–23)
Calcium: 9 mg/dL (ref 8.4–10.5)
GFR calc non Af Amer: 78 mL/min — ABNORMAL LOW (ref >90)
Glucose, Bld: 90 mg/dL (ref 70–99)
Potassium: 4.1 mEq/L (ref 3.5–5.1)
Sodium: 138 mEq/L (ref 135–145)

## 2013-09-08 LAB — POCT I-STAT TROPONIN I: Troponin i, poc: 0.01 ng/mL (ref 0.00–0.08)

## 2013-09-08 LAB — PROTIME-INR: Prothrombin Time: 26.9 seconds — ABNORMAL HIGH (ref 11.6–15.2)

## 2013-09-08 MED ORDER — HYDROCODONE-HOMATROPINE 5-1.5 MG/5ML PO SYRP: 5.0000 mL | ORAL_SOLUTION | Freq: Four times a day (QID) | ORAL | Status: DC | PRN

## 2013-09-08 MED ORDER — FAMOTIDINE 20 MG PO TABS
20.0000 mg | ORAL_TABLET | Freq: Every day | ORAL | Status: DC
  Administered 2013-09-09 – 2013-09-14 (×6): 20 mg via ORAL
  Filled 2013-09-08 (×6): qty 1

## 2013-09-08 MED ORDER — OXYBUTYNIN CHLORIDE ER 10 MG PO TB24
10.0000 mg | ORAL_TABLET | Freq: Every day | ORAL | Status: DC
  Administered 2013-09-08 – 2013-09-14 (×7): 10 mg via ORAL
  Filled 2013-09-08 (×7): qty 1

## 2013-09-08 MED ORDER — CARVEDILOL 6.25 MG PO TABS
6.2500 mg | ORAL_TABLET | Freq: Two times a day (BID) | ORAL | Status: DC
  Administered 2013-09-09 – 2013-09-11 (×4): 6.25 mg via ORAL
  Filled 2013-09-08 (×6): qty 1

## 2013-09-08 MED ORDER — NITROGLYCERIN 2 % TD OINT
1.0000 [in_us] | TOPICAL_OINTMENT | Freq: Once | TRANSDERMAL | Status: AC
  Administered 2013-09-08: 1 [in_us] via TOPICAL
  Filled 2013-09-08: qty 1

## 2013-09-08 MED ORDER — HYDROCODONE-ACETAMINOPHEN 7.5-325 MG PO TABS
1.0000 | ORAL_TABLET | Freq: Four times a day (QID) | ORAL | Status: DC | PRN
  Administered 2013-09-08 – 2013-09-13 (×4): 1 via ORAL
  Filled 2013-09-08 (×4): qty 1

## 2013-09-08 MED ORDER — FUROSEMIDE 10 MG/ML IJ SOLN
40.0000 mg | Freq: Two times a day (BID) | INTRAMUSCULAR | Status: DC
  Administered 2013-09-08 – 2013-09-12 (×8): 40 mg via INTRAVENOUS
  Filled 2013-09-08 (×12): qty 4

## 2013-09-08 MED ORDER — ACETAMINOPHEN 325 MG PO TABS
650.0000 mg | ORAL_TABLET | ORAL | Status: DC | PRN
  Administered 2013-09-12: 650 mg via ORAL
  Filled 2013-09-08: qty 2

## 2013-09-08 MED ORDER — ESCITALOPRAM OXALATE 10 MG PO TABS
10.0000 mg | ORAL_TABLET | Freq: Every day | ORAL | Status: DC
  Administered 2013-09-09 – 2013-09-14 (×6): 10 mg via ORAL
  Filled 2013-09-08 (×6): qty 1

## 2013-09-08 MED ORDER — DILTIAZEM HCL ER COATED BEADS 300 MG PO CP24
300.0000 mg | ORAL_CAPSULE | Freq: Every day | ORAL | Status: DC
  Administered 2013-09-09 – 2013-09-10 (×2): 300 mg via ORAL
  Filled 2013-09-08 (×3): qty 1

## 2013-09-08 MED ORDER — FUROSEMIDE 10 MG/ML IJ SOLN
20.0000 mg | Freq: Once | INTRAMUSCULAR | Status: AC
  Administered 2013-09-08: 20 mg via INTRAVENOUS
  Filled 2013-09-08: qty 2

## 2013-09-08 MED ORDER — SODIUM CHLORIDE 0.9 % IJ SOLN
3.0000 mL | Freq: Two times a day (BID) | INTRAMUSCULAR | Status: DC
  Administered 2013-09-08 – 2013-09-14 (×8): 3 mL via INTRAVENOUS

## 2013-09-08 MED ORDER — SODIUM CHLORIDE 0.9 % IJ SOLN: 3.0000 mL | INTRAMUSCULAR | Status: DC | PRN

## 2013-09-08 MED ORDER — DM-GUAIFENESIN ER 30-600 MG PO TB12
1.0000 | ORAL_TABLET | Freq: Two times a day (BID) | ORAL | Status: DC
  Administered 2013-09-08 – 2013-09-14 (×12): 1 via ORAL
  Filled 2013-09-08 (×13): qty 1

## 2013-09-08 MED ORDER — DOCUSATE SODIUM 100 MG PO CAPS
100.0000 mg | ORAL_CAPSULE | Freq: Every day | ORAL | Status: DC | PRN
  Administered 2013-09-10: 100 mg via ORAL
  Filled 2013-09-08: qty 1

## 2013-09-08 MED ORDER — DILTIAZEM HCL 100 MG IV SOLR
5.0000 mg/h | Freq: Once | INTRAVENOUS | Status: AC
  Administered 2013-09-08: 5 mg/h via INTRAVENOUS

## 2013-09-08 MED ORDER — MOMETASONE FURO-FORMOTEROL FUM 100-5 MCG/ACT IN AERO
2.0000 | INHALATION_SPRAY | Freq: Two times a day (BID) | RESPIRATORY_TRACT | Status: DC
  Administered 2013-09-08 – 2013-09-14 (×9): 2 via RESPIRATORY_TRACT
  Filled 2013-09-08 (×2): qty 8.8

## 2013-09-08 MED ORDER — SODIUM CHLORIDE 0.9 % IV SOLN: 250.0000 mL | INTRAVENOUS | Status: DC | PRN

## 2013-09-08 MED ORDER — ONDANSETRON HCL 4 MG/2ML IJ SOLN: 4.0000 mg | Freq: Four times a day (QID) | INTRAMUSCULAR | Status: DC | PRN

## 2013-09-08 MED ORDER — ROPINIROLE HCL 1 MG PO TABS
1.0000 mg | ORAL_TABLET | Freq: Two times a day (BID) | ORAL | Status: DC
  Administered 2013-09-08 – 2013-09-13 (×11): 1 mg via ORAL
  Filled 2013-09-08 (×13): qty 1

## 2013-09-08 MED ORDER — DILTIAZEM HCL 25 MG/5ML IV SOLN
10.0000 mg | Freq: Once | INTRAVENOUS | Status: AC
  Administered 2013-09-08: 10 mg via INTRAVENOUS
  Filled 2013-09-08: qty 5

## 2013-09-08 NOTE — ED Provider Notes (Signed)
CSN: [id number]     Arrival date & time 09/08/13  1326 History   First MD Initiated Contact with Patient 09/08/13 1353     Chief Complaint  Patient presents with  . Shortness of Breath   (Consider location/radiation/quality/duration/timing/severity/associated sxs/prior Treatment) HPI  This is  a 79 year old female with a history of atrial fibrillation, hypertension, hyperlipidemia, diastolic heart failure who presents with shortness of breath. Patient reports a three-week history of cough and shortness of breath. Patient has been treated with a Z-Pak, prednisone, and an inhaler by her primary care physician. Patient reports that over the last 3-5 days, she has had increasing "wet cough." She was started on a trial of Lasix on November 26 for 3 days but this was discontinued. She did see her primary Dr. several days ago with increasing leg swelling. At that time her BNP was reassuring and she was not started on any Lasix. Patient reports worsening dyspnea on exertion and cough. Patient denies any chest pain.  Past Medical History  Diagnosis Date  . Arthritis   . Persistent atrial fibrillation   . Renal calculi   . Hypertension   . Hyperlipidemia   . Rheumatic fever     age 76  . History of blood transfusion   . Incontinence of urine   . Diastolic dysfunction     preserved EF,  CHF In setting of afib previously  . Osteoporosis   . OSA (obstructive sleep apnea)     uses CPAP  . Restless legs    Past Surgical History  Procedure Laterality Date  . Breast surgery      benign biopsy  . Tonsillectomy    . Abdominal hysterectomy    . Spine surgery      laminectomy 1965, spinal fusion with rod 2005  . Replacement total knee  2005  . Kidney stone surgery      1997 removed   Family History  Problem Relation Age of Onset  . Cancer Mother     breast  . Diabetes Mother   . Cancer Paternal Grandfather    History  Substance Use Topics  . Smoking status: Never Smoker   . Smokeless  tobacco: Not on file  . Alcohol Use: Yes     Comment: glass of wine each day   OB History   Grav Para Term Preterm Abortions TAB SAB Ect Mult Living                 Review of Systems  Constitutional: Negative for fever.  Respiratory: Positive for cough and shortness of breath. Negative for chest tightness.   Cardiovascular: Negative for chest pain.  Gastrointestinal: Negative for nausea, vomiting and abdominal pain.  Genitourinary: Negative for dysuria.  Musculoskeletal: Negative for back pain.  Neurological: Negative for headaches.  All other systems reviewed and are negative.    Allergies  Ciprofloxacin; Demerol; and Sulfa antibiotics  Home Medications   Current Outpatient Rx  Name  Route  Sig  Dispense  Refill  . acetaminophen (TYLENOL) 650 MG CR tablet   Oral   Take 650 mg by mouth every 8 (eight) hours as needed for pain.          . carvedilol (COREG) 3.125 MG tablet   Oral   Take 1 tablet (3.125 mg total) by mouth 2 (two) times daily with a meal.   60 tablet   3   . Cholecalciferol (VITAMIN D3) 2000 UNITS TABS   Oral   Take by mouth.  Once a day         . dextromethorphan-guaiFENesin (MUCINEX DM) 30-600 MG per 12 hr tablet   Oral   Take 1 tablet by mouth 2 (two) times daily.         [facility] diltiazem (CARTIA XT) 180 MG 24 hr capsule   Oral   Take 180 mg by mouth daily.         [facility] docusate sodium (COLACE) 100 MG capsule   Oral   Take 100 mg by mouth daily as needed for mild constipation.         [facility] escitalopram (LEXAPRO) 10 MG tablet   Oral   Take 1 tablet (10 mg total) by mouth daily.   90 tablet   3   . famotidine (PEPCID) 20 MG tablet   Oral   Take 20 mg by mouth 2 (two) times daily.         . Fluticasone-Salmeterol (ADVAIR) 250-50 MCG/DOSE AEPB   Inhalation   Inhale 1 puff into the lungs 2 (two) times daily.         [facility] HYDROcodone-acetaminophen (NORCO) 7.5-325 MG per tablet   Oral   Take 1 tablet by mouth every 6 (six) hours as needed  for pain.         [facility] HYDROcodone-homatropine (HYCODAN) 5-1.5 MG/5ML syrup   Oral   Take 5 mLs by mouth every 6 (six) hours as needed for cough.   120 mL   0   . Omega-3 Fatty Acids (FISH OIL) 1200 MG CAPS   Oral   Take by mouth. Twice a day         . oxybutynin (DITROPAN-XL) 10 MG 24 hr tablet   Oral   Take 1 tablet (10 mg total) by mouth daily.   90 tablet   3   . ranitidine (ZANTAC) 150 MG tablet   Oral   Take 150 mg by mouth 2 (two) times daily.         [facility] Kitchen rOPINIRole (REQUIP) 1 MG tablet   Oral   Take 1 tablet (1 mg total) by mouth 2 (two) times daily.   180 tablet   3   . rosuvastatin (CRESTOR) 40 MG tablet   Oral   Take 1 tablet (40 mg total) by mouth daily.   90 tablet   3    BP 111/75  Pulse 115  Temp(Src) 98 F (36.7 C) (Oral)  Resp 27  Wt 196 lb 11.2 oz (89.223 kg)  SpO2 90% Physical Exam  Nursing note and vitals reviewed. Constitutional: She is oriented to person, place, and time. No distress.  Elderly, no acute  HENT:  Head: Normocephalic and atraumatic.  Eyes: Pupils are equal, round, and reactive to light.  Neck: JVD present.  Cardiovascular: Normal rate, regular rhythm and normal heart sounds.   No murmur heard. Pulmonary/Chest: Effort normal. No respiratory distress. She has wheezes. She has rales.  Tachypnea without respiratory distress  Abdominal: Soft. Bowel sounds are normal. There is no tenderness. There is no rebound.  Musculoskeletal:  2+ bilateral lower extremity edema  Neurological: She is alert and oriented to person, place, and time.  Skin: Skin is warm and dry.  Psychiatric: She has a normal mood and affect.    ED Course  Procedures (including critical care time) Labs Review Labs Reviewed  PRO B NATRIURETIC PEPTIDE - Abnormal; Notable for the following:    Pro B Natriuretic peptide (BNP) 2410.0 (*)    All other components within  normal limits  CBC WITH DIFFERENTIAL - Abnormal; Notable for the following:    RBC 3.72  (*)    Hemoglobin 11.7 (*)    HCT 33.7 (*)    RDW 16.1 (*)    Monocytes Relative 15 (*)    All other components within normal limits  BASIC METABOLIC PANEL - Abnormal; Notable for the following:    GFR calc non Af Amer 78 (*)    All other components within normal limits  POCT I-STAT TROPONIN I   Imaging Review Dg Chest 2 View  09/08/2013   CLINICAL DATA:  Shortness of breath, cough  EXAM: CHEST  2 VIEW  COMPARISON:  07/27/2013  FINDINGS: Cardiomegaly with pulmonary vascular congestion and mild interstitial edema.  Mild focal/patchy right upper lobe opacity, new, possibly reflecting focal edema although infection is not excluded.  Mild lingular/left lower lobe opacity, similar to the prior study, likely scarring/atelectasis.  Suspected small bilateral pleural effusions, new.  Right shoulder arthroplasty.  Lumbar spine fixation hardware.  IMPRESSION: Cardiomegaly with mild interstitial edema and small bilateral pleural effusions.  Mild focal/patchy right upper lobe opacity, possibly reflecting focal edema, infection not excluded.  Mild lingular/left lower lobe opacity, likely scarring/atelectasis.   Electronically Signed   By: Charline Bills M.D.   On: 09/08/2013 15:27    EKG Interpretation    Date/Time:  Saturday September 08 2013 13:32:12 EST Ventricular Rate:  117 PR Interval:    QRS Duration: 70 QT Interval:  354 QTC Calculation: 493 R Axis:   92 Text Interpretation:  Atrial fibrillation with rapid ventricular response Anterolateral infarct , age undetermined Abnormal ECG Confirmed by Emmalou Hunger  MD, Tomorrow Dehaas ([id number]) on 09/08/2013 1:58:55 PM           Medications  diltiazem (CARDIZEM) injection 10 mg (0 mg Intravenous Stopped 09/08/13 1657)  nitroGLYCERIN (NITROGLYN) 2 % ointment 1 inch (1 inch Topical Given 09/08/13 1623)  furosemide (LASIX) injection 20 mg (20 mg Intravenous Given 09/08/13 1623)  diltiazem (CARDIZEM) 100 mg in dextrose 5 % 100 mL infusion (10 mg/hr  Intravenous Rate/Dose Change 09/08/13 1752)   CRITICAL CARE Performed by: Ross Marcus, F   Total critical care time: 35 min  Critical care time was exclusive of separately billable procedures and treating other patients.  Critical care was necessary to treat or prevent imminent or life-threatening deterioration.  Critical care was time spent personally by me on the following activities: development of treatment plan with patient and/or surrogate as well as nursing, discussions with consultants, evaluation of patient's response to treatment, examination of patient, obtaining history from patient or surrogate, ordering and performing treatments and interventions, ordering and review of laboratory studies, ordering and review of radiographic studies, pulse oximetry and re-evaluation of patient's condition.  MDM   1. Atrial fibrillation   2. Acute on chronic diastolic CHF (congestive heart failure)   3. Anticoagulant long-term use   4. Cough   5. Hypertension   6. OSA (obstructive sleep apnea)    Patient presents with progressive shortness of breath and cough. Initial vital signs are notable for tachycardia and tachypnea. She has evidence of volume overload on exam. She is in atrial fibrillation on EKG. Lab work is notable for an elevated BNP. Troponin is negative. Chest x-ray shows evidence of pulmonary edema and pleural effusions. I feel this is likely the etiology of the patient's shortness of breath. Patient's heart rate noted to go into the 130s. She was given diltiazem and started on a drip. Patient was  also given 20 mg of IV Lasix. Patient was placed on supplemental oxygen for comfort and for oxygen saturations in the low 90s. I discussed the patient with Dr. Johney Frame who will admit the patient for rate control and diuresis.     Shon Baton, MD 09/08/13 [phone]

## 2013-09-08 NOTE — ED Notes (Signed)
Pt c/o SOB with wheezing and rhonchi noted; pt labored at rest; pt denies pain; pt with cough and noted to have irregular rhythm with hx of afib placed on coreg by PCP this week

## 2013-09-08 NOTE — Consult Note (Signed)
ANTICOAGULATION CONSULT NOTE - Initial Consult  Pharmacy Consult for Coumadin Indication: atrial fibrillation  Allergies  Allergen Reactions  . Ciprofloxacin   . Demerol [Meperidine]   . Sulfa Antibiotics     Patient Measurements: Height: 5' (152.4 cm) Weight: 186 lb 1.1 oz (84.4 kg) IBW/kg (Calculated) : 45.5  Vital Signs: Temp: 98.6 F (37 C) (12/13 1908) Temp src: Oral (12/13 1908) BP: 118/68 mmHg (12/13 1908) Pulse Rate: 91 (12/13 1908)  Labs:  Recent Labs  09/08/13 1433 09/08/13 2100  HGB 11.7*  --   HCT 33.7*  --   PLT 150  --   LABPROT  --  26.9*  INR  --  2.59*  CREATININE 0.78  --     Estimated Creatinine Clearance: 55.9 ml/min (by C-G formula based on Cr of 0.78).   Medical History: Past Medical History  Diagnosis Date  . Arthritis   . Persistent atrial fibrillation   . Renal calculi   . Hypertension   . Hyperlipidemia   . Rheumatic fever     age 49  . History of blood transfusion   . Incontinence of urine   . Diastolic dysfunction     preserved EF,  CHF In setting of afib previously  . Osteoporosis   . OSA (obstructive sleep apnea)     uses CPAP  . Restless legs    Assessment: 80yof on coumadin pta for afib, being admitted for CHF exacerbation 2/2 afib RVR. INR on admission is therapeutic at 2.59. Plan is for cardioversion.  Home dose: 5mg  daily except 2.5mg  MWF - last dose taken today  Goal of Therapy:  INR 2-3 Monitor platelets by anticoagulation protocol: Yes   Plan:  1) No further coumadin needed tonight 2) Daily INR  Fredrik Rigger 09/08/2013,9:45 PM

## 2013-09-08 NOTE — H&P (Signed)
[facility] ADMIT NOTE     Primary Care Physician: Kristian Covey, MD Referring Physician:  ER  Admit Date: 09/08/2013  Reason for consultation:  Afib/ CHF  Brinsley Wence is a 79 y.o. female with a h/o persistent atrial fibrillation, rheumatic fever at age 77, HTN, and sleep apnea who presents with complaint of progressive SOB.  She lives most of her year in [place] and comes to [place] from time to time.  I have not seen her in over a year.  Her INRs have continued to be managed from [place] but per her daughter have not been subtherapeutic.  She underwent shoulder surgery 9/14 and has been in [place] since that time.  For a few weeks, she has had progressive cough, congestion, and SOB.  She has not noticed palpitations but now presents in afib.  The duration of her afib is unknown.  She has had worsening SOB, orthopnea, and edema for 1 week.  She has been placed on lasix by Dr Caryl Never without improvement.  Today, she denies symptoms of palpitations, chest pain, dizziness, presyncope, syncope, or neurologic sequela. The patient is tolerating medications without difficulties and is otherwise without complaint today.   Past Medical History  Diagnosis Date  . Arthritis   . Persistent atrial fibrillation   . Renal calculi   . Hypertension   . Hyperlipidemia   . Rheumatic fever     age 51  . History of blood transfusion   . Incontinence of urine   . Diastolic dysfunction     preserved EF,  CHF In setting of afib previously  . Osteoporosis   . OSA (obstructive sleep apnea)     uses CPAP  . Restless legs    Past Surgical History  Procedure Laterality Date  . Breast surgery      benign biopsy  . Tonsillectomy    . Abdominal hysterectomy    . Spine surgery      laminectomy 1965, spinal fusion with rod 2005  . Replacement total knee  2005  . Kidney stone surgery      1997 removed    Allergies  Allergen Reactions  . Ciprofloxacin   . Demerol [Meperidine]   . Sulfa  Antibiotics     History   Social History  . Marital Status: Divorced    Spouse Name: N/A    Number of Children: N/A  . Years of Education: N/A   Occupational History  . Not on file.   Social History Main Topics  . Smoking status: Never Smoker   . Smokeless tobacco: Not on file  . Alcohol Use: Yes     Comment: glass of wine each day  . Drug Use: No  . Sexual Activity: Not on file   Other Topics Concern  . Not on file   Social History Narrative   Lives in [place] alone.  Spends 4-6 months per year in [place] with her daughter.   Retired [employer] Services operational officer    Family History  Problem Relation Age of Onset  . Cancer Mother     breast  . Diabetes Mother   . Cancer Paternal Grandfather     ROS- All systems are reviewed and negative except as per the HPI above  Physical Exam: Telemetry: Filed Vitals:   09/08/13 1700 09/08/13 1703 09/08/13 1715 09/08/13 1730  BP: 119/73 119/73 133/78 116/80  Pulse: 101 89 92 54  Temp:      TempSrc:      Resp: 33 34 29 32  Weight:      SpO2: 91% 90% 89% 93%    GEN- The patient is well appearing, alert and oriented x 3 today.   Head- normocephalic, atraumatic Eyes-  Sclera clear, conjunctiva pink Ears- hearing intact Oropharynx- clear Neck- supple, + JVD Lungs- bibasilar rales with tachypnea and frequent cough Heart- irregular rate and rhythm, no murmurs, rubs or gallops,  GI- soft, NT, ND, + BS Extremities- no clubbing, cyanosis, + edema MS- age appropriate muscle atrophy Skin- no rash or lesion Psych- euthymic mood, full affect Neuro- strength and sensation are intact  EKG: today reveals afib with elevated V rates, no ischemic changes  Labs:   Lab Results  Component Value Date   WBC 4.2 09/08/2013   HGB 11.7* 09/08/2013   HCT 33.7* 09/08/2013   MCV 90.6 09/08/2013   PLT 150 09/08/2013    Recent Labs Lab 09/08/13 1433  NA 138  K 4.1  CL 105  CO2 19  BUN 20  CREATININE 0.78  CALCIUM 9.0  GLUCOSE 90   No  results found for this basename: CKTOTAL, CKMB, CKMBINDEX, TROPONINI    Lab Results  Component Value Date   CHOL 169 12/20/2011   Lab Results  Component Value Date   HDL 78.80 12/20/2011   Lab Results  Component Value Date   LDLCALC 69 12/20/2011   Lab Results  Component Value Date   TRIG 108.0 12/20/2011   Lab Results  Component Value Date   CHOLHDL 2 12/20/2011   No results found for this basename: LDLDIRECT      Radiology: reviewed  Echo from 2012 is reviewed  ASSESSMENT AND PLAN:   1. Persistent afib The patient has symptomatic persistent afib.  She is chronically anticoagulated with couamdin.  Her daughter reports that last INR check was 3 weeks ago and was 3.9.  Her CHADS2VASC score is at least 4 and therefore long term anticoagulation is recommended.  We will obtain an INR at this time.  I suspect that her CHF is due to afib with RVR and that she would improve clinically with sinus rhythm. I will continue metoprolol and cardizem. Once we know that her INR > 2, we will proceed with cardioversion.  I think that using flecainide 300mg  po x 1 or ibutilide would be a reasonable attempt at chemical cardioversion.  If her INR < 2, then she would require TEE and [facility] with parental heparin bridge.  2. Acute CHF Likely due to afib with RVR.  EF was normal in 2012.  We will update with an echo at this time. IV lasix with strict Is and Os We will proceed with pursuit of sinus rhythm as above.  3. HTN Stable No change required today  4. OSA CPAP while here  She will likely require 2-3 days of hospitalization while we diurese the patient and pursue sinus rhythm  Hillis Range, MD 09/08/2013  5:48 PM

## 2013-09-08 NOTE — ED Notes (Signed)
Patient transported to X-ray 

## 2013-09-09 LAB — BASIC METABOLIC PANEL
BUN: 15 mg/dL (ref 6–23)
Calcium: 8.7 mg/dL (ref 8.4–10.5)
Chloride: 104 mEq/L (ref 96–112)
Creatinine, Ser: 0.81 mg/dL (ref 0.50–1.10)
GFR calc Af Amer: 79 mL/min — ABNORMAL LOW (ref >90)
GFR calc non Af Amer: 68 mL/min — ABNORMAL LOW (ref >90)
Glucose, Bld: 93 mg/dL (ref 70–99)
Potassium: 3.3 mEq/L — ABNORMAL LOW (ref 3.5–5.1)

## 2013-09-09 LAB — T4, FREE: Free T4: 1.15 ng/dL (ref 0.80–1.80)

## 2013-09-09 LAB — TSH: TSH: 1.875 u[IU]/mL (ref 0.350–4.500)

## 2013-09-09 LAB — CBC
HCT: 33.4 % — ABNORMAL LOW (ref 36.0–46.0)
Hemoglobin: 11.2 g/dL — ABNORMAL LOW (ref 12.0–15.0)
MCH: 30.7 pg (ref 26.0–34.0)
MCHC: 33.5 g/dL (ref 30.0–36.0)
MCV: 91.5 fL (ref 78.0–100.0)
RDW: 16 % — ABNORMAL HIGH (ref 11.5–15.5)

## 2013-09-09 MED ORDER — POTASSIUM CHLORIDE CRYS ER 20 MEQ PO TBCR
40.0000 meq | EXTENDED_RELEASE_TABLET | Freq: Once | ORAL | Status: AC
  Administered 2013-09-09: 40 meq via ORAL
  Filled 2013-09-09: qty 2

## 2013-09-09 MED ORDER — WARFARIN - PHARMACIST DOSING INPATIENT
Freq: Every day | Status: DC
  Administered 2013-09-09 – 2013-09-13 (×5)

## 2013-09-09 MED ORDER — GUAIFENESIN-CODEINE 100-10 MG/5ML PO SOLN
5.0000 mL | ORAL | Status: DC | PRN
  Administered 2013-09-09 – 2013-09-14 (×10): 5 mL via ORAL
  Filled 2013-09-09 (×10): qty 5

## 2013-09-09 MED ORDER — GUAIFENESIN-DM 100-10 MG/5ML PO SYRP
5.0000 mL | ORAL_SOLUTION | ORAL | Status: DC | PRN
  Administered 2013-09-09: 5 mL via ORAL
  Filled 2013-09-09: qty 5

## 2013-09-09 MED ORDER — FLECAINIDE ACETATE 100 MG PO TABS
300.0000 mg | ORAL_TABLET | Freq: Once | ORAL | Status: AC
  Administered 2013-09-09: 300 mg via ORAL
  Filled 2013-09-09: qty 3

## 2013-09-09 MED ORDER — WARFARIN SODIUM 5 MG PO TABS
5.0000 mg | ORAL_TABLET | Freq: Once | ORAL | Status: AC
  Administered 2013-09-09: 5 mg via ORAL
  Filled 2013-09-09: qty 1

## 2013-09-09 NOTE — Progress Notes (Signed)
Patient refused CPAP tonight. There Is a machine in the room at this time. RN aware. Explained to Patient that if they changed their mind, to just have the RN call Respiratory and we would come set them up. 

## 2013-09-09 NOTE — Progress Notes (Signed)
Patient ID: Angel Meyer, female   DOB: Jan 06, 1933, 79 y.o.   MRN: [id number] Subjective:  C/o cough, dyspnea improved.  Objective:  Vital Signs in the last 24 hours: Temp:  [98 F (36.7 C)-98.9 F (37.2 C)] 98.9 F (37.2 C) (12/14 0528) Pulse Rate:  [26-136] 115 (12/14 0528) Resp:  [18-37] 24 (12/14 0528) BP: (105-135)/(66-97) 113/77 mmHg (12/14 0528) SpO2:  [84 %-94 %] 92 % (12/14 0528) Weight:  [183 lb 3.2 oz (83.1 kg)-196 lb 11.2 oz (89.223 kg)] 183 lb 3.2 oz (83.1 kg) (12/14 0500)  Intake/Output from previous day: 12/13 0701 - 12/14 0700 In: 240 [P.O.:240] Out: 3350 [Urine:3350] Intake/Output from this shift:    Physical Exam: Well appearing NAD HEENT: Unremarkable Neck:  No JVD, no thyromegally Back:  No CVA tenderness Lungs:  Scattered rales and wheezes HEART:  IRegular rate rhythm, no murmurs, no rubs, no clicks Abd:  soft, obese, positive bowel sounds, no organomegally, no rebound, no guarding Ext:  2 plus pulses, no edema, no cyanosis, no clubbing Skin:  No rashes no nodules Neuro:  CN II through XII intact, motor grossly intact  Lab Results:  Recent Labs  09/08/13 1433 09/09/13 0505  WBC 4.2 3.8*  HGB 11.7* 11.2*  PLT 150 140*    Recent Labs  09/08/13 1433 09/09/13 0505  NA 138 139  K 4.1 3.3*  CL 105 104  CO2 19 25  GLUCOSE 90 93  BUN 20 15  CREATININE 0.78 0.81   No results found for this basename: TROPONINI, CK, MB,  in the last 72 hours Hepatic Function Panel No results found for this basename: PROT, ALBUMIN, AST, ALT, ALKPHOS, BILITOT, BILIDIR, IBILI,  in the last 72 hours No results found for this basename: CHOL,  in the last 72 hours No results found for this basename: PROTIME,  in the last 72 hours  Imaging: Dg Chest 2 View  09/08/2013   CLINICAL DATA:  Shortness of breath, cough  EXAM: CHEST  2 VIEW  COMPARISON:  07/27/2013  FINDINGS: Cardiomegaly with pulmonary vascular congestion and mild interstitial edema.  Mild focal/patchy  right upper lobe opacity, new, possibly reflecting focal edema although infection is not excluded.  Mild lingular/left lower lobe opacity, similar to the prior study, likely scarring/atelectasis.  Suspected small bilateral pleural effusions, new.  Right shoulder arthroplasty.  Lumbar spine fixation hardware.  IMPRESSION: Cardiomegaly with mild interstitial edema and small bilateral pleural effusions.  Mild focal/patchy right upper lobe opacity, possibly reflecting focal edema, infection not excluded.  Mild lingular/left lower lobe opacity, likely scarring/atelectasis.   Electronically Signed   By: Charline Bills M.D.   On: 09/08/2013 15:27    Cardiac Studies: Tele - atrial fib with a rvr Assessment/Plan:  1. Atrial fib with an RVR 2. Acute on chronic presumed diastolic CHF 3. Acute bronchitis 4. Chronic coumadin therapy Rec: her INR is therapeutic. Will give flecainide today. If no conversion, will DCCV tomorrow. Await 2D echo.  LOS: 1 day    Georgian Mcclory,M.D. 09/09/2013, 9:25 AM

## 2013-09-09 NOTE — Consult Note (Signed)
ANTICOAGULATION CONSULT NOTE - Initial Consult  Pharmacy Consult for Coumadin Indication: atrial fibrillation  Allergies  Allergen Reactions  . Ciprofloxacin   . Demerol [Meperidine]   . Sulfa Antibiotics     Patient Measurements: Height: 5' (152.4 cm) Weight: 183 lb 3.2 oz (83.1 kg) IBW/kg (Calculated) : 45.5  Vital Signs: Temp: 98.9 F (37.2 C) (12/14 0528) Temp src: Oral (12/14 0528) BP: 113/77 mmHg (12/14 0528) Pulse Rate: 115 (12/14 0528)  Labs:  Recent Labs  09/08/13 1433 09/08/13 2100 09/09/13 0505  HGB 11.7*  --  11.2*  HCT 33.7*  --  33.4*  PLT 150  --  140*  LABPROT  --  26.9* 25.5*  INR  --  2.59* 2.42*  CREATININE 0.78  --  0.81    Estimated Creatinine Clearance: 54.7 ml/min (by C-G formula based on Cr of 0.81).   Medical History: Past Medical History  Diagnosis Date  . Arthritis   . Persistent atrial fibrillation   . Renal calculi   . Hypertension   . Hyperlipidemia   . Rheumatic fever     age 55  . History of blood transfusion   . Incontinence of urine   . Diastolic dysfunction     preserved EF,  CHF In setting of afib previously  . Osteoporosis   . OSA (obstructive sleep apnea)     uses CPAP  . Restless legs    Assessment: 80yof on coumadin pta for afib, admitted for CHF exacerbation 2/2 afib RVR. INR is therapeutic. Plan for flecainide today and if no conversion then DCCV tomorrow.  Home dose: 5mg  daily except 2.5mg  MWF - last dose taken today  Goal of Therapy:  INR 2-3 Monitor platelets by anticoagulation protocol: Yes   Plan:  1) Coumadin 5mg  x 1 2) INR in AM  Fredrik Rigger 09/09/2013,10:33 AM

## 2013-09-09 NOTE — Evaluation (Signed)
Physical Therapy Evaluation Patient Details Name: Angel Meyer MRN: [id number] DOB: [birth year] Today's Date: 09/09/2013 Time: 4098-1191 PT Time Calculation (min): 23 min  PT Assessment / Plan / Recommendation History of Present Illness  79 y.o. female with a h/o persistent atrial fibrillation, rheumatic fever at age 85, HTN, and sleep apnea who presents with complaint of progressive SOB  Clinical Impression  Pt presenting with generalized deconditioning and decreased activity tolerance. Pt with persistent cough as well. Pt with good home set up and support and anticipate pt to be safe for d/c home with family to provide 24/7 assist and con't of outpt PT from PTA.    PT Assessment  Patient needs continued PT services    Follow Up Recommendations  Outpatient PT;Supervision/Assistance - 24 hour (con't outpt PT from PTA)    Does the patient have the potential to tolerate intense rehabilitation      Barriers to Discharge        Equipment Recommendations  None recommended by PT    Recommendations for Other Services     Frequency Min 3X/week    Precautions / Restrictions Precautions Precautions: Fall Restrictions Weight Bearing Restrictions: Yes RUE Weight Bearing: Non weight bearing (per patient still not cleared to WB from shld surgery)   Pertinent Vitals/Pain Denies pain      Mobility  Bed Mobility Bed Mobility: Supine to Sit Supine to Sit: 6: Modified independent (Device/Increase time);HOB elevated;With rails Details for Bed Mobility Assistance: increased time Transfers Transfers: Sit to Stand;Stand to Sit;Stand Pivot Transfers Sit to Stand: 4: Min guard;With upper extremity assist;From bed Stand to Sit: 4: Min guard;With upper extremity assist;To chair/3-in-1 Stand Pivot Transfers: 4: Min guard (to [facility]) Details for Transfer Assistance: pt with safe technique Ambulation/Gait Ambulation/Gait Assistance: 4: Min guard Ambulation Distance (Feet): 50 Feet Assistive  device: Large base quad cane Ambulation/Gait Assistance Details: v/c's for proper sequencing of cane with R LE. Pt slow and steady. Offered RW however pt declined due to "I'm not clear to put weight on it." in reference to R UE. Gait Pattern: Step-through pattern;Decreased stride length;Wide base of support (decreased step height) Gait velocity: slow/guarded Stairs: No    Exercises     PT Diagnosis: Difficulty walking;Generalized weakness  PT Problem List: Decreased strength;Decreased activity tolerance;Decreased balance;Decreased mobility PT Treatment Interventions: DME instruction;Gait training;Functional mobility training;Therapeutic activities;Therapeutic exercise     PT Goals(Current goals can be found in the care plan section) Acute Rehab PT Goals Patient Stated Goal: home PT Goal Formulation: With patient/family Time For Goal Achievement: 09/23/13 Potential to Achieve Goals: Good  Visit Information  Last PT Received On: 09/09/13 Assistance Needed: +1 History of Present Illness: 79 y.o. female with a h/o persistent atrial fibrillation, rheumatic fever at age 27, HTN, and sleep apnea who presents with complaint of progressive SOB       Prior Functioning  Home Living Family/patient expects to be discharged to:: Private residence Living Arrangements: Children Available Help at Discharge: Family;Available 24 hours/day Type of Home: House Home Access: Level entry Home Layout: Multi-level Alternate Level Stairs-Number of Steps:  (has an Engineer, structural) Home Equipment: Walker - 2 wheels;Cane - quad;Bedside commode Prior Function Level of Independence: Needs assistance Gait / Transfers Assistance Needed: uses quad cane ADL's / Homemaking Assistance Needed: was beginning to do things on own again since her R shld replacement Communication Communication: No difficulties Dominant Hand: Right    Cognition  Cognition Arousal/Alertness: Awake/alert Behavior During Therapy: WFL for  tasks assessed/performed Overall Cognitive Status: Within Functional  Limits for tasks assessed    Extremity/Trunk Assessment Upper Extremity Assessment Upper Extremity Assessment: RUE deficits/detail RUE Deficits / Details: slightly limited shld ROM due to recent surgery Lower Extremity Assessment Lower Extremity Assessment: Generalized weakness Cervical / Trunk Assessment Cervical / Trunk Assessment: Normal   Balance    End of Session PT - End of Session Equipment Utilized During Treatment: Gait belt Activity Tolerance: Patient tolerated treatment well Patient left: in chair;with call bell/phone within reach;with family/visitor present Nurse Communication: Mobility status  GP     Marcene Brawn 09/09/2013, 1:39 PM  Lewis Shock, PT, DPT Pager #: [phone] Office #: [phone]

## 2013-09-10 ENCOUNTER — Encounter (HOSPITAL_COMMUNITY)
Admission: EM | Disposition: A | Discharge status: Home / Self Care | Payer: Self-pay | Source: Home / Self Care | Attending: Internal Medicine

## 2013-09-10 ENCOUNTER — Inpatient Hospital Stay (HOSPITAL_COMMUNITY): Payer: Medicare Other | Admitting: Anesthesiology

## 2013-09-10 ENCOUNTER — Encounter (HOSPITAL_COMMUNITY): Payer: Medicare Other | Admitting: Anesthesiology

## 2013-09-10 DIAGNOSIS — I517 Cardiomegaly: Secondary | ICD-10-CM

## 2013-09-10 HISTORY — PX: CARDIOVERSION: SHX1299

## 2013-09-10 LAB — BASIC METABOLIC PANEL
BUN: 23 mg/dL (ref 6–23)
Calcium: 8.1 mg/dL — ABNORMAL LOW (ref 8.4–10.5)
Calcium: 9.2 mg/dL (ref 8.4–10.5)
Creatinine, Ser: 0.94 mg/dL (ref 0.50–1.10)
Creatinine, Ser: 0.98 mg/dL (ref 0.50–1.10)
GFR calc Af Amer: 66 mL/min — ABNORMAL LOW (ref >90)
GFR calc Af Amer: 62 mL/min — ABNORMAL LOW (ref >90)
GFR calc non Af Amer: 57 mL/min — ABNORMAL LOW (ref >90)
GFR calc non Af Amer: 54 mL/min — ABNORMAL LOW (ref >90)
Potassium: 4.6 mEq/L (ref 3.5–5.1)
Sodium: 137 mEq/L (ref 135–145)

## 2013-09-10 LAB — PROTIME-INR
INR: 2.83 — ABNORMAL HIGH (ref 0.00–1.49)
Prothrombin Time: 28.8 seconds — ABNORMAL HIGH (ref 11.6–15.2)

## 2013-09-10 LAB — MAGNESIUM: Magnesium: 1.8 mg/dL (ref 1.5–2.5)

## 2013-09-10 SURGERY — CARDIOVERSION
Anesthesia: Monitor Anesthesia Care

## 2013-09-10 MED ORDER — LACTATED RINGERS IV SOLN
INTRAVENOUS | Status: DC | PRN
  Administered 2013-09-10: 08:00:00 via INTRAVENOUS

## 2013-09-10 MED ORDER — PROPOFOL 10 MG/ML IV BOLUS
INTRAVENOUS | Status: DC | PRN
  Administered 2013-09-10: 90 mg via INTRAVENOUS

## 2013-09-10 MED ORDER — WARFARIN SODIUM 2.5 MG PO TABS
2.5000 mg | ORAL_TABLET | ORAL | Status: AC
  Administered 2013-09-10: 2.5 mg via ORAL
  Filled 2013-09-10: qty 1

## 2013-09-10 MED ORDER — POTASSIUM CHLORIDE CRYS ER 20 MEQ PO TBCR
40.0000 meq | EXTENDED_RELEASE_TABLET | Freq: Every day | ORAL | Status: DC
  Administered 2013-09-10 – 2013-09-14 (×5): 40 meq via ORAL
  Filled 2013-09-10 (×6): qty 2

## 2013-09-10 MED ORDER — POTASSIUM CHLORIDE CRYS ER 20 MEQ PO TBCR: 40.0000 meq | EXTENDED_RELEASE_TABLET | Freq: Once | ORAL | Status: DC

## 2013-09-10 MED ORDER — WARFARIN SODIUM 5 MG PO TABS
5.0000 mg | ORAL_TABLET | ORAL | Status: AC
  Administered 2013-09-11: 5 mg via ORAL
  Filled 2013-09-10: qty 1

## 2013-09-10 MED ORDER — POTASSIUM CHLORIDE CRYS ER 20 MEQ PO TBCR
40.0000 meq | EXTENDED_RELEASE_TABLET | Freq: Once | ORAL | Status: AC
  Administered 2013-09-10: 40 meq via ORAL

## 2013-09-10 NOTE — Anesthesia Postprocedure Evaluation (Signed)
Anesthesia Post Note  Patient: Angel Meyer  Procedure(s) Performed: Procedure(s) (LRB): CARDIOVERSION (N/A)  Anesthesia type: General  Patient location: PACU  Post pain: Pain level controlled  Post assessment: Patient's Cardiovascular Status Stable  Last Vitals:  Filed Vitals:   09/10/13 0638  BP: 117/75  Pulse: 95  Temp:   Resp:     Post vital signs: Reviewed and stable  Level of consciousness: alert  Complications: No apparent anesthesia complications

## 2013-09-10 NOTE — Transfer of Care (Signed)
Immediate Anesthesia Transfer of Care Note  Patient: Angel Meyer  Procedure(s) Performed: Procedure(s): CARDIOVERSION (N/A)  Patient Location: PACU and Nursing Unit  Anesthesia Type:MAC  Level of Consciousness: awake, alert  and oriented  Airway & Oxygen Therapy: Patient Spontanous Breathing and Patient connected to nasal cannula oxygen  Post-op Assessment: Post -op Vital signs reviewed and stable  Post vital signs: Reviewed and stable  Complications: No apparent anesthesia complications

## 2013-09-10 NOTE — Care Management Note (Signed)
    Page 1 of 2   09/14/2013     2:45:11 PM   CARE MANAGEMENT NOTE 09/14/2013  Patient:  Angel Meyer, Angel Meyer   Account Number:  [id number]  Date Initiated:  09/10/2013  Documentation initiated by:  Kaleiyah Polsky  Subjective/Objective Assessment:   PT ADM ON 12/13 WITH CHF, AFIB WITH RVR.  PTA, PT INDEPENDENT, STAYING WITH DAUGHTER CURRENTLY.  ORIGINALLY FROM [place].     Action/Plan:   WILL FOLLOW FOR DISCHARGE NEEDS AS PT PROGRESSES.   Anticipated DC Date:  09/13/2013   Anticipated DC Plan:  HOME/SELF CARE  In-house referral  Chaplain      DC Planning Services  CM consult  Medication Assistance      Choice offered to / List presented to:             Status of service:  Completed, signed off Medicare Important Message given?   (If response is "NO", the following Medicare IM given date fields will be blank) Date Medicare IM given:   Date Additional Medicare IM given:    Discharge Disposition:  HOME/SELF CARE  Per UR Regulation:  Reviewed for med. necessity/level of care/duration of stay  If discussed at Long Length of Stay Meetings, dates discussed:   09/13/2013    Comments:  09/14/13 Carrington Olazabal,RN,BSN [phone] PT FOR DC HOME TODAY WITH DAUGHTER.  TIKOSYN HAS BEEN DISCONTINUED.  09/13/13 Gillis Boardley,RN,BSN [phone] REFERRAL TO [facility] FOR ADVANCE DIRECTIVES REQUEST.  PT RE-CARDIOVERTED TODAY.  09/11/13 Jeany Seville,RN,BSN [phone] PT TO DC ON TIKOSYN; CHECKED COVERAGE FOR MED--SHE WILL HAVE A $0 COPAY FOR 30 DAY SUPPLY.  PT'S PHARMACY, [facility] IN [place] CAN DISPENSE TIKOSYN.  PT WILL NEED 2 RX FOR TIKOSYN AT DC; ONE WITH REFILLS AND ONE FOR A WEEK'S SUPPLY OF MED TO BE FILLED BY MAIN PHARMACY PRIOR TO DC.

## 2013-09-10 NOTE — CV Procedure (Addendum)
EP procedure Note   Pre procedure Diagnosis:  Persistent Atrial fibrillation Post procedure Diagnosis:  Same  Procedures:  Electrical cardioversion  Description:  Informed, written consent was obtained for cardioversion.  Adequate IV acces and airway support were assured.  The patient was adequately sedated with intravenous propofol as outlined in the anesthesia report.  The patient presented today in atrial fibrillation.  She was unsuccessfully cardioverted despite three synchronized biphasic 200J shocks delivered with cardioversion electrodes placed in the anterior/posterior configuration.  She remains in afib thereafter.  There were no early apparent complications.  Conclusions:  1.  unsuccessful cardioversion of afib  2.  No early apparent complications.  Will correct electrolytes and then initiate antiarrhythmic therapy, likely with tikosyn.   Maleki Hippe,MD 8:25 AM 09/10/2013

## 2013-09-10 NOTE — Progress Notes (Signed)
Patient: Angel Meyer Date of Encounter: 09/10/2013, 7:25 AM Admit date: 09/08/2013     Subjective  Ms. Reddy denies any new complaints. Still with cough and SOB. Denies CP.   Objective  Physical Exam: Vitals: BP 117/75  Pulse 95  Temp(Src) 97.4 F (36.3 C) (Oral)  Resp 20  Ht 5' (1.524 m)  Wt 185 lb 13.6 oz (84.3 kg)  BMI 36.30 kg/m2  SpO2 92% General: Well developed, well appearing 79 year old female in no acute distress. Neck: Supple. JVD not elevated. Lungs: Clear bilaterally to auscultation without wheezes, rales, or rhonchi. Breathing is unlabored. Heart: Irregular S1 S2 without murmurs, rubs, or gallops.  Abdomen: Soft, non-distended. Extremities: No clubbing or cyanosis. No edema.  Distal pedal pulses are 2+ and equal bilaterally. Neuro: Alert and oriented X 3. Moves all extremities spontaneously. No focal deficits.  Intake/Output:  Intake/Output Summary (Last 24 hours) at 09/10/13 0725 Last data filed at 09/10/13 0644  Gross per 24 hour  Intake    240 ml  Output   1300 ml  Net  -1060 ml    Inpatient Medications:  . carvedilol  6.25 mg Oral BID WC  . dextromethorphan-guaiFENesin  1 tablet Oral BID  . diltiazem  300 mg Oral Daily  . escitalopram  10 mg Oral Daily  . famotidine  20 mg Oral Daily  . furosemide  40 mg Intravenous BID  . mometasone-formoterol  2 puff Inhalation BID  . oxybutynin  10 mg Oral Daily  . rOPINIRole  1 mg Oral BID  . sodium chloride  3 mL Intravenous Q12H  . Warfarin - Pharmacist Dosing Inpatient   Does not apply q1800    Labs:  Recent Labs  09/08/13 1433 09/08/13 2100 09/09/13 0505  NA 138  --  139  K 4.1  --  3.3*  CL 105  --  104  CO2 19  --  25  GLUCOSE 90  --  93  BUN 20  --  15  CREATININE 0.78  --  0.81  CALCIUM 9.0  --  8.7  MG  --  2.0  --     Recent Labs  09/08/13 1433 09/09/13 0505  WBC 4.2 3.8*  NEUTROABS 2.7  --   HGB 11.7* 11.2*  HCT 33.7* 33.4*  MCV 90.6 91.5  PLT 150 140*    Recent  Labs  09/08/13 2100  TSH 1.875    Recent Labs  09/09/13 0505  INR 2.42*    Radiology/Studies: Dg Chest 2 View  09/08/2013   CLINICAL DATA:  Shortness of breath, cough  EXAM: CHEST  2 VIEW  COMPARISON:  07/27/2013  FINDINGS: Cardiomegaly with pulmonary vascular congestion and mild interstitial edema.  Mild focal/patchy right upper lobe opacity, new, possibly reflecting focal edema although infection is not excluded.  Mild lingular/left lower lobe opacity, similar to the prior study, likely scarring/atelectasis.  Suspected small bilateral pleural effusions, new.  Right shoulder arthroplasty.  Lumbar spine fixation hardware.  IMPRESSION: Cardiomegaly with mild interstitial edema and small bilateral pleural effusions.  Mild focal/patchy right upper lobe opacity, possibly reflecting focal edema, infection not excluded.  Mild lingular/left lower lobe opacity, likely scarring/atelectasis.   Electronically Signed   By: Charline Bills M.D.   On: 09/08/2013 15:27    Echocardiogram: pending Telemetry: AFib   Assessment and Plan  1. Persistent AFib  Symptomatic, persistent AFib. Chronically anticoagulated with coumadin. Her daughter reports that last INR check was 3 weeks ago and was 3.9.  Her CHADS2VASC score is at least 4 and therefore long term anticoagulation is recommended. Suspect her CHF is due to AFib with RVR and that she would improve clinically with sinus rhythm.  Continue BB and cardizem.  Per Dr. Fawn Kirk, for DCCV today. 2. Acute CHF  Likely due to AFib with RVR. EF was normal in 2012. We will update with an echo at this time.  IV lasix with strict Is and Os  We will proceed with pursuit of sinus rhythm as above.   Dr. Johney Frame to see Signed, EDMISTEN, BROOKE PA-C  I have seen, examined the patient, and reviewed the above assessment and plan.  Changes to above are made where necessary.  She is appropriately anticoagulated and did not cardiovert with flecainide bolus yesterday.  Risks,  benefits, and alternatives to [facility] were discussed with the patient who wishes to proceed. If we are able to restart sinus rhythm then we will continue flecainide 100mg  BID thereafter. Echo is pending Continue IV diuresis x 1 more day. Replete K.  Co Sign: Hillis Range, MD 09/10/2013 8:12 AM

## 2013-09-10 NOTE — Progress Notes (Signed)
  Echocardiogram 2D Echocardiogram has been performed.  Angel Meyer 09/10/2013, 12:06 PM

## 2013-09-10 NOTE — Progress Notes (Signed)
Placed patient on CPAP for the night via auto-mode with minimum pressure set at 6cm and maximum pressure set at 20cm  

## 2013-09-10 NOTE — Progress Notes (Signed)
PHARMACY FOLLOW UP NOTE   Pharmacy Consult for : Coumadin Indication: Persistent atrial fibrillation   Dosing Weight: 84 kg   Recent Labs  09/08/13 1433 09/08/13 2100 09/09/13 0505 09/10/13 0730 09/10/13 0836  HGB 11.7*  --  11.2*  --   --   HCT 33.7*  --  33.4*  --   --   PLT 150  --  140*  --   --   LABPROT  --  26.9* 25.5* 28.8*  --   INR  --  2.59* 2.42* 2.83*  --   CREATININE 0.78  --  0.81  --  0.94   Lab Results  Component Value Date   INR 2.83* 09/10/2013   INR 2.42* 09/09/2013   INR 2.59* 09/08/2013    Estimated Creatinine Clearance: 47.5 ml/min (by C-G formula based on Cr of 0.94).  Pertinent Medications:  Scheduled:  . carvedilol  6.25 mg Oral BID WC  . dextromethorphan-guaiFENesin  1 tablet Oral BID  . diltiazem  300 mg Oral Daily  . escitalopram  10 mg Oral Daily  . famotidine  20 mg Oral Daily  . furosemide  40 mg Intravenous BID  . mometasone-formoterol  2 puff Inhalation BID  . oxybutynin  10 mg Oral Daily  . potassium chloride  40 mEq Oral Daily  . rOPINIRole  1 mg Oral BID  . sodium chloride  3 mL Intravenous Q12H  . Warfarin - Pharmacist Dosing Inpatient   Does not apply q1800    Assessment:  79 y/o female continuing as an inpatient with Coumadin for persistent atrial fibrillation.  Noted DCCV failure today.  Anticipate starting Tikosyn after correction of electrolytes.  INR remains therapeutic on home dose of Coumadin.  Goal:  INR 2-3   Plan: 1. Resume Home Coumadin schedule of 5 mg TTSS, 2.5 mg on MWF. 2. Continue Daily INR's, CBC.  Monitor for bleeding complications.     Lashan Macias, Deetta Perla.D 09/10/2013, 12:13 PM

## 2013-09-10 NOTE — Anesthesia Preprocedure Evaluation (Addendum)
Anesthesia Evaluation  Patient identified by MRN, date of birth, ID band Patient awake    Airway       Dental   Pulmonary asthma , sleep apnea ,          Cardiovascular hypertension, +CHF + dysrhythmias Atrial Fibrillation     Neuro/Psych    GI/Hepatic   Endo/Other    Renal/GU Renal InsufficiencyRenal disease     Musculoskeletal   Abdominal   Peds  Hematology   Anesthesia Other Findings   Reproductive/Obstetrics                          Anesthesia Physical Anesthesia Plan  ASA: III  Anesthesia Plan: MAC   Post-op Pain Management:    Induction: Intravenous  Airway Management Planned: Mask  Additional Equipment:   Intra-op Plan:   Post-operative Plan: Extubation in OR  Informed Consent: I have reviewed the patients History and Physical, chart, labs and discussed the procedure including the risks, benefits and alternatives for the proposed anesthesia with the patient or authorized representative who has indicated his/her understanding and acceptance.     Plan Discussed with:   Anesthesia Plan Comments:         Anesthesia Quick Evaluation

## 2013-09-11 ENCOUNTER — Ambulatory Visit: Payer: Medicare Other | Admitting: Family Medicine

## 2013-09-11 ENCOUNTER — Encounter (HOSPITAL_COMMUNITY): Payer: Self-pay | Admitting: Internal Medicine

## 2013-09-11 DIAGNOSIS — I5021 Acute systolic (congestive) heart failure: Secondary | ICD-10-CM

## 2013-09-11 LAB — BASIC METABOLIC PANEL
BUN: 28 mg/dL — ABNORMAL HIGH (ref 6–23)
Calcium: 9.1 mg/dL (ref 8.4–10.5)
GFR calc Af Amer: 59 mL/min — ABNORMAL LOW (ref >90)
GFR calc non Af Amer: 51 mL/min — ABNORMAL LOW (ref >90)
Glucose, Bld: 91 mg/dL (ref 70–99)
Potassium: 4.1 mEq/L (ref 3.5–5.1)
Sodium: 138 mEq/L (ref 135–145)

## 2013-09-11 LAB — PROTIME-INR
INR: 2.33 — ABNORMAL HIGH (ref 0.00–1.49)
Prothrombin Time: 24.8 seconds — ABNORMAL HIGH (ref 11.6–15.2)

## 2013-09-11 MED ORDER — SODIUM CHLORIDE 0.9 % IJ SOLN: 3.0000 mL | INTRAMUSCULAR | Status: DC | PRN

## 2013-09-11 MED ORDER — DILTIAZEM HCL ER COATED BEADS 240 MG PO CP24
240.0000 mg | ORAL_CAPSULE | Freq: Every day | ORAL | Status: DC
  Administered 2013-09-11 – 2013-09-13 (×3): 240 mg via ORAL
  Filled 2013-09-11 (×4): qty 1

## 2013-09-11 MED ORDER — SODIUM CHLORIDE 0.9 % IV SOLN: 250.0000 mL | INTRAVENOUS | Status: DC | PRN

## 2013-09-11 MED ORDER — CARVEDILOL 12.5 MG PO TABS
12.5000 mg | ORAL_TABLET | Freq: Two times a day (BID) | ORAL | Status: DC
  Administered 2013-09-11 – 2013-09-14 (×6): 12.5 mg via ORAL
  Filled 2013-09-11 (×8): qty 1

## 2013-09-11 MED ORDER — DOFETILIDE 500 MCG PO CAPS
500.0000 ug | ORAL_CAPSULE | Freq: Two times a day (BID) | ORAL | Status: DC
  Administered 2013-09-11 – 2013-09-13 (×5): 500 ug via ORAL
  Filled 2013-09-11 (×9): qty 1

## 2013-09-11 MED ORDER — SODIUM CHLORIDE 0.9 % IJ SOLN
3.0000 mL | Freq: Two times a day (BID) | INTRAMUSCULAR | Status: DC
  Administered 2013-09-11 – 2013-09-14 (×7): 3 mL via INTRAVENOUS

## 2013-09-11 NOTE — Progress Notes (Addendum)
SUBJECTIVE: The patient is doing "ok" today.  She did not rest very much last night.  She also has a non productive cough that has been present for 8 weeks.  Otherwise, she denies chest pain, shortness of breath, or any new concerns.  Failed cardioversion yesterday - plan to start Tikosyn today.   Echocardiogram yesterday demonstrated EF 30-35%, LA 51, PA pressure 33 - EF previously normal   CURRENT MEDICATIONS: . carvedilol  6.25 mg Oral BID WC  . dextromethorphan-guaiFENesin  1 tablet Oral BID  . diltiazem  300 mg Oral Daily  . escitalopram  10 mg Oral Daily  . famotidine  20 mg Oral Daily  . furosemide  40 mg Intravenous BID  . mometasone-formoterol  2 puff Inhalation BID  . oxybutynin  10 mg Oral Daily  . potassium chloride  40 mEq Oral Daily  . rOPINIRole  1 mg Oral BID  . sodium chloride  3 mL Intravenous Q12H  . warfarin  5 mg Oral Q T,Th,S,Su-1800  . Warfarin - Pharmacist Dosing Inpatient   Does not apply q1800      OBJECTIVE: Physical Exam: Filed Vitals:   09/10/13 1828 09/10/13 2017 09/10/13 2308 09/11/13 0451  BP: 101/67 105/74  111/70  Pulse: 85 64 75 42  Temp:  98 F (36.7 C)  97.7 F (36.5 C)  TempSrc:  Oral  Oral  Resp:  20 18 18   Height:      Weight:    182 lb 1.6 oz (82.6 kg)  SpO2:  97%  92%    Intake/Output Summary (Last 24 hours) at 09/11/13 2130 Last data filed at 09/11/13 0602  Gross per 24 hour  Intake   1420 ml  Output   2200 ml  Net   -780 ml    Telemetry reveals atrial fibrillation, ventricular rates 90-100  GEN- The patient is well appearing, alert and oriented x 3 today.   Head- normocephalic, atraumatic Eyes-  Sclera clear, conjunctiva pink Ears- hearing intact Oropharynx- clear Neck- supple, no JVP Lymph- no cervical lymphadenopathy Lungs- Clear to ausculation bilaterally, normal work of breathing Heart- irregular rate and rhythm, no murmurs, rubs or gallops, PMI not laterally displaced GI- soft, NT, ND, + BS Extremities-  no clubbing, cyanosis, or edema Skin- no rash or lesion Psych- euthymic mood, full affect Neuro- strength and sensation are intact  LABS: Basic Metabolic Panel:  Recent Labs  86/57/84 0836 09/10/13 1630 09/11/13 0353  NA 139 137 138  K 3.1* 4.6 4.1  CL 105 102 104  CO2 22 23 25   GLUCOSE 93 100* 91  BUN 23 27* 28*  CREATININE 0.94 0.98 1.02  CALCIUM 8.1* 9.2 9.1  MG 1.8  --  1.9   CBC:  Recent Labs  09/08/13 1433 09/09/13 0505  WBC 4.2 3.8*  NEUTROABS 2.7  --   HGB 11.7* 11.2*  HCT 33.7* 33.4*  MCV 90.6 91.5  PLT 150 140*   Thyroid Function Tests:  Recent Labs  09/08/13 2100  TSH 1.875   RADIOLOGY: Dg Chest 2 View 09/08/2013   CLINICAL DATA:  Shortness of breath, cough  EXAM: CHEST  2 VIEW  COMPARISON:  07/27/2013  FINDINGS: Cardiomegaly with pulmonary vascular congestion and mild interstitial edema.  Mild focal/patchy right upper lobe opacity, new, possibly reflecting focal edema although infection is not excluded.  Mild lingular/left lower lobe opacity, similar to the prior study, likely scarring/atelectasis.  Suspected small bilateral pleural effusions, new.  Right shoulder arthroplasty.  Lumbar spine  fixation hardware.  IMPRESSION: Cardiomegaly with mild interstitial edema and small bilateral pleural effusions.  Mild focal/patchy right upper lobe opacity, possibly reflecting focal edema, infection not excluded.  Mild lingular/left lower lobe opacity, likely scarring/atelectasis.   Electronically Signed   By: Charline Bills M.D.   On: 09/08/2013 15:27    ASSESSMENT AND PLAN:  Active Problems:   Atrial fibrillation   CHF (congestive heart failure)  1. afib Failed medical therapy with flecainide Risks, benefits, and alternatives to tikosyn were discussed with the patient and she wishes to proceed with this medicine. Continue coumadin Start tikosyn today  2. EF 30% Likely tachycardia mediated Will increase coreg and decrease diltiazem Add losartan  (presently has a cough already and therefore I am rectant to use an ace inhibitor).  Also prefer ARB given data suggesting improved atrial remodeling with this therapy We need to perform stress testing once in sinus (likely as an outpatient) Continue IV diuresis x 1 more day.  3. OSA Uses CPAP  4. HTN Stable No change required today   She will need to remain in the hospital for tikosyn loading x 3 more days

## 2013-09-11 NOTE — Progress Notes (Signed)
Physical Therapy Treatment Patient Details Name: Angel Meyer MRN: [id number] DOB: 12-05-1932 Today's Date: 09/11/2013 Time: 1105-1140 PT Time Calculation (min): 35 min  PT Assessment / Plan / Recommendation  History of Present Illness 79 y.o. female with a h/o persistent atrial fibrillation, rheumatic fever at age 71, HTN, and sleep apnea who presents with complaint of progressive SOB   PT Comments   Patient progressing with ambulation.  Did note her ambulating in hallway earlier with daughter assist.  Feel shoulder exercises need to be performed daily at lest during remainder of hospitalization.  Daughter had printed HEP available in the room.  Follow Up Recommendations  Outpatient PT;Supervision/Assistance - 24 hour     Does the patient have the potential to tolerate intense rehabilitation   N/A  Barriers to Discharge  None      Equipment Recommendations  None recommended by PT    Recommendations for Other Services  None  Frequency Min 3X/week   Progress towards PT Goals Progress towards PT goals: Progressing toward goals  Plan Current plan remains appropriate    Precautions / Restrictions Precautions Precautions: Fall Restrictions RUE Weight Bearing: Non weight bearing Other Position/Activity Restrictions: recent shoulder replacement   Pertinent Vitals/Pain Min c/o right shoulder with ex; ice applied after    Mobility  Bed Mobility Supine to Sit: 6: Modified independent (Device/Increase time);HOB elevated;With rails Transfers Sit to Stand: 4: Min guard;With upper extremity assist;From bed Stand to Sit: 4: Min guard;With upper extremity assist;To bed Ambulation/Gait Ambulation/Gait Assistance: 4: Min guard Ambulation Distance (Feet): 300 Feet Assistive device: Large base quad cane Ambulation/Gait Assistance Details: assist for safety and balance Gait Pattern: Step-through pattern;Decreased stride length;Wide base of support    Exercises Shoulder Exercises Shoulder  Flexion: AROM;AAROM;Right;10 reps;Supine Shoulder ABduction: Sidelying;AAROM;Right;10 reps Shoulder External Rotation: AROM;Right;10 reps;Sidelying Neck Lateral Flexion - Right: AROM;Other reps (comment);Seated Neck Lateral Flexion - Left: AROM;Other reps (comment);Seated Other Exercises Other Exercises: neck rotation 3 reps to each side 5 sec hold Other Exercises: cane flexion supine x 5     PT Goals (current goals can now be found in the care plan section)    Visit Information  Last PT Received On: 09/11/13 Assistance Needed: +1 History of Present Illness: 79 y.o. female with a h/o persistent atrial fibrillation, rheumatic fever at age 54, HTN, and sleep apnea who presents with complaint of progressive SOB    Subjective Data      Cognition  Cognition Arousal/Alertness: Awake/alert Behavior During Therapy: WFL for tasks assessed/performed Overall Cognitive Status: Within Functional Limits for tasks assessed    Balance  Balance Balance Assessed: Yes Static Standing Balance Static Standing - Balance Support: Left upper extremity supported Static Standing - Level of Assistance: 5: Stand by assistance  End of Session PT - End of Session Equipment Utilized During Treatment: Gait belt Activity Tolerance: Patient tolerated treatment well Patient left: in bed;with call bell/phone within reach;with family/visitor present   GP     [facility] 09/11/2013, 1:22 PM [employer], PT [phone] 09/11/2013

## 2013-09-11 NOTE — Progress Notes (Signed)
Pharmacy Note-Anticoagulation  Pharmacy Consult :  79 y.o. female is currently on Coumadin for Persistent atrial fibrillation  .  Latest Labs : Hematology : Lab Results  Component Value Date   INR 2.33* 09/11/2013   INR 2.83* 09/10/2013        INR 2.42* 09/09/2013   HGB 11.2* 09/09/2013   HGB 11.7* 09/08/2013    Current Medication[s] Include: Scheduled:  Prescriptions prior to admission  Medication Sig Dispense Refill  . acetaminophen (TYLENOL) 650 MG CR tablet Take 650 mg by mouth every 8 (eight) hours as needed for pain.       . carvedilol (COREG) 3.125 MG tablet Take 1 tablet (3.125 mg total) by mouth 2 (two) times daily with a meal.  60 tablet  3  . Cholecalciferol (VITAMIN D3) 2000 UNITS TABS Take by mouth. Once a day      . dextromethorphan-guaiFENesin (MUCINEX DM) 30-600 MG per 12 hr tablet Take 1 tablet by mouth 2 (two) times daily.      Marland Kitchen diltiazem (CARTIA XT) 180 MG 24 hr capsule Take 180 mg by mouth daily.      Marland Kitchen docusate sodium (COLACE) 100 MG capsule Take 100 mg by mouth daily as needed for mild constipation.      Marland Kitchen escitalopram (LEXAPRO) 10 MG tablet Take 1 tablet (10 mg total) by mouth daily.  90 tablet  3  . famotidine (PEPCID) 20 MG tablet Take 20 mg by mouth 2 (two) times daily.      . Fluticasone-Salmeterol (ADVAIR) 250-50 MCG/DOSE AEPB Inhale 1 puff into the lungs 2 (two) times daily.      Marland Kitchen HYDROcodone-acetaminophen (NORCO) 7.5-325 MG per tablet Take 1 tablet by mouth every 6 (six) hours as needed for pain.      Marland Kitchen HYDROcodone-homatropine (HYCODAN) 5-1.5 MG/5ML syrup Take 5 mLs by mouth every 6 (six) hours as needed for cough.  120 mL  0  . Omega-3 Fatty Acids (FISH OIL) 1200 MG CAPS Take by mouth. Twice a day      . oxybutynin (DITROPAN-XL) 10 MG 24 hr tablet Take 1 tablet (10 mg total) by mouth daily.  90 tablet  3  . ranitidine (ZANTAC) 150 MG tablet Take 150 mg by mouth 2 (two) times daily.      Marland Kitchen rOPINIRole (REQUIP) 1 MG tablet Take 1 tablet (1 mg total) by  mouth 2 (two) times daily.  180 tablet  3  . rosuvastatin (CRESTOR) 40 MG tablet Take 1 tablet (40 mg total) by mouth daily.  90 tablet  3   Scheduled:  . carvedilol  12.5 mg Oral BID WC  . dextromethorphan-guaiFENesin  1 tablet Oral BID  . diltiazem  240 mg Oral Daily  . dofetilide  500 mcg Oral Q12H  . escitalopram  10 mg Oral Daily  . famotidine  20 mg Oral Daily  . furosemide  40 mg Intravenous BID  . mometasone-formoterol  2 puff Inhalation BID  . oxybutynin  10 mg Oral Daily  . potassium chloride  40 mEq Oral Daily  . rOPINIRole  1 mg Oral BID  . sodium chloride  3 mL Intravenous Q12H  . sodium chloride  3 mL Intravenous Q12H  . warfarin  5 mg Oral Q T,Th,S,Su-1800  . Warfarin - Pharmacist Dosing Inpatient   Does not apply q1800   Assessment :  Today's INR is within the therapeutic range on her home dose schedule..   INR is 2.33.    No bleeding complications observed.  Goal :  INR goal is 2-3  .  Plan : 1. Continue Coumadin home schedule. 2. Daily INR's. Monitor for bleeding complications  Laurena Bering, Pharm.D. 09/11/2013  2:00 PM

## 2013-09-12 ENCOUNTER — Inpatient Hospital Stay (HOSPITAL_COMMUNITY): Payer: Medicare Other

## 2013-09-12 LAB — PROTIME-INR
INR: 2.83 — ABNORMAL HIGH (ref 0.00–1.49)
Prothrombin Time: 28.8 seconds — ABNORMAL HIGH (ref 11.6–15.2)

## 2013-09-12 LAB — BASIC METABOLIC PANEL
BUN: 23 mg/dL (ref 6–23)
Chloride: 101 mEq/L (ref 96–112)
GFR calc Af Amer: 69 mL/min — ABNORMAL LOW (ref >90)
Potassium: 3.3 mEq/L — ABNORMAL LOW (ref 3.5–5.1)
Sodium: 139 mEq/L (ref 135–145)

## 2013-09-12 LAB — MAGNESIUM: Magnesium: 1.9 mg/dL (ref 1.5–2.5)

## 2013-09-12 MED ORDER — WARFARIN SODIUM 5 MG PO TABS
5.0000 mg | ORAL_TABLET | Freq: Every day | ORAL | Status: DC
  Administered 2013-09-13: 5 mg via ORAL
  Filled 2013-09-12 (×2): qty 1

## 2013-09-12 MED ORDER — POTASSIUM CHLORIDE CRYS ER 20 MEQ PO TBCR
40.0000 meq | EXTENDED_RELEASE_TABLET | Freq: Once | ORAL | Status: AC
  Administered 2013-09-12: 40 meq via ORAL

## 2013-09-12 MED ORDER — WARFARIN SODIUM 2.5 MG PO TABS
2.5000 mg | ORAL_TABLET | ORAL | Status: AC
  Administered 2013-09-12: 2.5 mg via ORAL
  Filled 2013-09-12 (×5): qty 1

## 2013-09-12 NOTE — Progress Notes (Signed)
Patient says she does not want to wear her CPAP machine for tonight. Patient is aware to call if she does change her mind. RT will continue to assist as needed.

## 2013-09-12 NOTE — Progress Notes (Signed)
Pharmacy Note-Anticoagulation  Pharmacy Consult :  79 y.o. female is currently on chronic Coumadin for persistent atrial fibrillation   Latest Labs : Hematology : Lab Results  Component Value Date   INR 2.83* 09/12/2013   INR 2.33* 09/11/2013   INR 2.83* 09/10/2013    Current Medication[s] Include: Scheduled:  Scheduled:  . carvedilol  12.5 mg Oral BID WC  . dextromethorphan-guaiFENesin  1 tablet Oral BID  . diltiazem  240 mg Oral Daily  . dofetilide  500 mcg Oral Q12H  . escitalopram  10 mg Oral Daily  . famotidine  20 mg Oral Daily  . mometasone-formoterol  2 puff Inhalation BID  . oxybutynin  10 mg Oral Daily  . potassium chloride  40 mEq Oral Daily  . potassium chloride  40 mEq Oral Once  . rOPINIRole  1 mg Oral BID  . sodium chloride  3 mL Intravenous Q12H  . sodium chloride  3 mL Intravenous Q12H  . Warfarin - Pharmacist Dosing Inpatient   Does not apply q1800   Assessment :  Today's INR is remains therapjeutic.   INR is 2.83 on home dosing schedule.    No bleeding complications observed.  Electrolytes within guidelines for Tikosyn administration.  Latest QTc's noted.  Cardiology following closely.  Goal :  INR goal is 2-3    Plan : 1. Continue home Coumadin schedule. 2. Daily INR's, CBC. Monitor for bleeding complications 3. Follow-up Electrolytes, ECG's as indicated.  Ilynn Stauffer, Elisha Headland, Pharm.D. 09/12/2013  9:18 AM

## 2013-09-12 NOTE — Progress Notes (Signed)
SUBJECTIVE: The patient is doing ok today.  Still with non productive cough and sinus congestion - requesting decongestant.    Tikosyn loading started yesterday.   CURRENT MEDICATIONS: . carvedilol  12.5 mg Oral BID WC  . dextromethorphan-guaiFENesin  1 tablet Oral BID  . diltiazem  240 mg Oral Daily  . dofetilide  500 mcg Oral Q12H  . escitalopram  10 mg Oral Daily  . famotidine  20 mg Oral Daily  . furosemide  40 mg Intravenous BID  . mometasone-formoterol  2 puff Inhalation BID  . oxybutynin  10 mg Oral Daily  . potassium chloride  40 mEq Oral Daily  . rOPINIRole  1 mg Oral BID  . sodium chloride  3 mL Intravenous Q12H  . sodium chloride  3 mL Intravenous Q12H  . Warfarin - Pharmacist Dosing Inpatient   Does not apply q1800      OBJECTIVE: Physical Exam: Filed Vitals:   09/11/13 1950 09/11/13 1953 09/12/13 0015 09/12/13 0500  BP: 110/67   106/62  Pulse: 82  82 93  Temp: 98.3 F (36.8 C)   97.4 F (36.3 C)  TempSrc: Oral   Oral  Resp: 16  17 17   Height:      Weight:    182 lb 15.7 oz (83 kg)  SpO2: 96% 99% 99% 91%    Intake/Output Summary (Last 24 hours) at 09/12/13 1610 Last data filed at 09/11/13 2136  Gross per 24 hour  Intake    603 ml  Output   1051 ml  Net   -448 ml    Telemetry reveals atrial fibrillation, ventricular rates 90-100  GEN- The patient is well appearing, alert and oriented x 3 today.   Head- normocephalic, atraumatic Eyes-  Sclera clear, conjunctiva pink Ears- hearing intact Oropharynx- clear Neck- supple, no JVP Lymph- no cervical lymphadenopathy Lungs- Clear to ausculation bilaterally, normal work of breathing Heart- irregular rate and rhythm, no murmurs, rubs or gallops, PMI not laterally displaced GI- soft, NT, ND, + BS Extremities- no clubbing, cyanosis, or edema Skin- no rash or lesion Psych- euthymic mood, full affect Neuro- strength and sensation are intact  LABS: Basic Metabolic Panel:  Recent Labs  96/04/54 0836  09/10/13 1630 09/11/13 0353  NA 139 137 138  K 3.1* 4.6 4.1  CL 105 102 104  CO2 22 23 25   GLUCOSE 93 100* 91  BUN 23 27* 28*  CREATININE 0.94 0.98 1.02  CALCIUM 8.1* 9.2 9.1  MG 1.8  --  1.9   RADIOLOGY: Dg Chest 2 View 09/08/2013   CLINICAL DATA:  Shortness of breath, cough  EXAM: CHEST  2 VIEW  COMPARISON:  07/27/2013  FINDINGS: Cardiomegaly with pulmonary vascular congestion and mild interstitial edema.  Mild focal/patchy right upper lobe opacity, new, possibly reflecting focal edema although infection is not excluded.  Mild lingular/left lower lobe opacity, similar to the prior study, likely scarring/atelectasis.  Suspected small bilateral pleural effusions, new.  Right shoulder arthroplasty.  Lumbar spine fixation hardware.  IMPRESSION: Cardiomegaly with mild interstitial edema and small bilateral pleural effusions.  Mild focal/patchy right upper lobe opacity, possibly reflecting focal edema, infection not excluded.  Mild lingular/left lower lobe opacity, likely scarring/atelectasis.   Electronically Signed   By: Charline Bills M.D.   On: 09/08/2013 15:27   EKG - atrial fibrillation, ventricular rate 88  ASSESSMENT AND PLAN:  Active Problems:   Atrial fibrillation   CHF (congestive heart failure)  1. afib Failed medical therapy with flecainide  Risks, benefits, and alternatives to tikosyn were discussed with the patient and she wishes to proceed with this medicine. Continue coumadin Started tikosyn yesterday NPO after midnight.  Cardioversion tomorrow if still in afib  2. EF 30% Likely tachycardia mediated Add losartan (presently has a cough already and therefore I am rectant to use an ace inhibitor).  Also prefer ARB given data suggesting improved atrial remodeling with this therapy We need to perform stress testing once in sinus (likely as an outpatient) Stop lasix Will determine need for oral lasix after cardioversion  3. OSA Uses CPAP  4. HTN Stable No change  required today  5. Cough Repeat CXR mucinex  She will need to remain in the hospital for tikosyn loading x 2 more days

## 2013-09-12 NOTE — Progress Notes (Signed)
Ekg showed QTc of 523. On call Dr. Shawano Callas said I should not give Tikosyn. Dosage will be reassessed in the morning and order for an Ekg in the morning will be placed.

## 2013-09-13 ENCOUNTER — Encounter (HOSPITAL_COMMUNITY): Payer: Medicare Other | Admitting: Anesthesiology

## 2013-09-13 ENCOUNTER — Inpatient Hospital Stay (HOSPITAL_COMMUNITY): Payer: Medicare Other | Admitting: Anesthesiology

## 2013-09-13 ENCOUNTER — Encounter (HOSPITAL_COMMUNITY)
Admission: EM | Disposition: A | Discharge status: Home / Self Care | Payer: Self-pay | Source: Home / Self Care | Attending: Internal Medicine

## 2013-09-13 LAB — BASIC METABOLIC PANEL
CO2: 23 mEq/L (ref 19–32)
Calcium: 9.8 mg/dL (ref 8.4–10.5)
Creatinine, Ser: 0.86 mg/dL (ref 0.50–1.10)
GFR calc Af Amer: 73 mL/min — ABNORMAL LOW (ref >90)
GFR calc non Af Amer: 63 mL/min — ABNORMAL LOW (ref >90)
Sodium: 137 mEq/L (ref 135–145)

## 2013-09-13 LAB — MAGNESIUM: Magnesium: 2.1 mg/dL (ref 1.5–2.5)

## 2013-09-13 LAB — PROTIME-INR
INR: 2.77 — ABNORMAL HIGH (ref 0.00–1.49)
Prothrombin Time: 28.3 seconds — ABNORMAL HIGH (ref 11.6–15.2)

## 2013-09-13 SURGERY — ECHOCARDIOGRAM, TRANSESOPHAGEAL
Anesthesia: Moderate Sedation

## 2013-09-13 MED ORDER — SODIUM CHLORIDE 0.9 % IV SOLN: 250.0000 mL | INTRAVENOUS | Status: DC

## 2013-09-13 MED ORDER — SODIUM CHLORIDE 0.9 % IJ SOLN
3.0000 mL | Freq: Two times a day (BID) | INTRAMUSCULAR | Status: DC
  Administered 2013-09-13 – 2013-09-14 (×2): 3 mL via INTRAVENOUS

## 2013-09-13 MED ORDER — SODIUM CHLORIDE 0.9 % IJ SOLN: 3.0000 mL | INTRAMUSCULAR | Status: DC | PRN

## 2013-09-13 MED ORDER — PROPOFOL 10 MG/ML IV BOLUS
INTRAVENOUS | Status: DC | PRN
  Administered 2013-09-13: 10 mg via INTRAVENOUS
  Administered 2013-09-13 – 2013-11-16 (×3): 30 mg via INTRAVENOUS
  Administered 2013-11-16: 50 mg via INTRAVENOUS

## 2013-09-13 MED ORDER — SODIUM CHLORIDE 0.9 % IV SOLN: INTRAVENOUS | Status: DC

## 2013-09-13 NOTE — Progress Notes (Signed)
Pharmacy Note-Anticoagulation  Pharmacy Consult :  79 y.o. female is currently on Coumadin for persistent atrial fibrillation .   Latest Labs : Hematology : Lab Results  Component Value Date   INR 2.77* 09/13/2013   INR 2.83* 09/12/2013   INR 2.33* 09/11/2013   HGB 11.2* 09/09/2013   HGB 11.7* 09/08/2013    Current Medication[s] Include: Scheduled:  Scheduled:  . carvedilol  12.5 mg Oral BID WC  . dextromethorphan-guaiFENesin  1 tablet Oral BID  . diltiazem  240 mg Oral Daily  . dofetilide  500 mcg Oral Q12H  . escitalopram  10 mg Oral Daily  . famotidine  20 mg Oral Daily  . mometasone-formoterol  2 puff Inhalation BID  . oxybutynin  10 mg Oral Daily  . potassium chloride  40 mEq Oral Daily  . rOPINIRole  1 mg Oral BID  . sodium chloride  3 mL Intravenous Q12H  . sodium chloride  3 mL Intravenous Q12H  . sodium chloride  3 mL Intravenous Q12H  . warfarin  5 mg Oral q1800  . Warfarin - Pharmacist Dosing Inpatient   Does not apply q1800   IAssessment :  Today's INR remains therapeutic.   INR is 2.77.    No bleeding complications observed.  Patient receiving Tikosyn as per Cardiology with QTc's being evaluated and Tikosyn dosed.  CrCl > 50 ml/min  Electrolytes within Tikosyn dosing guidelines.  K+ 4.9, Mg++ 2.1.  Goal :  INR goal is 2-3    Plan : 1. Continue Coumadin home schedule. 2. Change INR's to every other day. Monitor for bleeding complications  Laurena Bering, Pharm.D. 09/13/2013  10:46 AM

## 2013-09-13 NOTE — Transfer of Care (Signed)
Immediate Anesthesia Transfer of Care Note  Patient: Angel Meyer  Procedure(s) Performed: Procedure(s): TRANSESOPHAGEAL ECHOCARDIOGRAM (TEE) (N/A) CARDIOVERSION (N/A)  Patient Location: PACU and Nursing Unit  Anesthesia Type:General  Level of Consciousness: awake, alert , oriented and sedated  Airway & Oxygen Therapy: Patient Spontanous Breathing and Patient connected to nasal cannula oxygen  Post-op Assessment: Report given to PACU RN, Post -op Vital signs reviewed and stable and Patient moving all extremities  Post vital signs: Reviewed and stable  Complications: No apparent anesthesia complications

## 2013-09-13 NOTE — Anesthesia Preprocedure Evaluation (Signed)
Anesthesia Evaluation  Patient identified by MRN, date of birth, ID band Patient awake    Reviewed: Allergy & Precautions, H&P , NPO status , Patient's Chart, lab work & pertinent test results  Airway       Dental   Pulmonary asthma , sleep apnea ,          Cardiovascular hypertension, +CHF + dysrhythmias     Neuro/Psych    GI/Hepatic   Endo/Other    Renal/GU Renal disease     Musculoskeletal   Abdominal   Peds  Hematology   Anesthesia Other Findings   Reproductive/Obstetrics                           Anesthesia Physical Anesthesia Plan  ASA: III  Anesthesia Plan: MAC   Post-op Pain Management:    Induction:   Airway Management Planned:   Additional Equipment:   Intra-op Plan:   Post-operative Plan:   Informed Consent:   Plan Discussed with:   Anesthesia Plan Comments:         Anesthesia Quick Evaluation

## 2013-09-13 NOTE — Progress Notes (Signed)
SUBJECTIVE: The patient is doing ok today.  Still with non productive cough and sinus congestion   Tikosyn held last night per fellow on call - restarted today. QTC is difficult to ascertain in atrial fib/flutter  Pending DCCV today  Labs pending this morning.    CURRENT MEDICATIONS: . carvedilol  12.5 mg Oral BID WC  . dextromethorphan-guaiFENesin  1 tablet Oral BID  . diltiazem  240 mg Oral Daily  . dofetilide  500 mcg Oral Q12H  . escitalopram  10 mg Oral Daily  . famotidine  20 mg Oral Daily  . mometasone-formoterol  2 puff Inhalation BID  . oxybutynin  10 mg Oral Daily  . potassium chloride  40 mEq Oral Daily  . rOPINIRole  1 mg Oral BID  . sodium chloride  3 mL Intravenous Q12H  . sodium chloride  3 mL Intravenous Q12H  . warfarin  5 mg Oral q1800  . Warfarin - Pharmacist Dosing Inpatient   Does not apply q1800      OBJECTIVE: Physical Exam: Filed Vitals:   09/12/13 1944 09/12/13 2128 09/13/13 0317 09/13/13 0318  BP:  96/74  125/69  Pulse:  89  100  Temp:  97.6 F (36.4 C)  97.6 F (36.4 C)  TempSrc:  Oral  Oral  Resp:  24  18  Height:      Weight:   180 lb 1.9 oz (81.7 kg)   SpO2: 99% 98%  94%    Intake/Output Summary (Last 24 hours) at 09/13/13 1610 Last data filed at 09/13/13 9604  Gross per 24 hour  Intake    720 ml  Output   1501 ml  Net   -781 ml    Telemetry reveals atrial fibrillation, ventricular rates 90-100  GEN- The patient is well appearing, alert and oriented x 3 today.   Head- normocephalic, atraumatic Eyes-  Sclera clear, conjunctiva pink Ears- hearing intact Oropharynx- clear Neck- supple, no JVP Lymph- no cervical lymphadenopathy Lungs- Clear to ausculation bilaterally, normal work of breathing Heart- irregular rate and rhythm, no murmurs, rubs or gallops, PMI not laterally displaced GI- soft, NT, ND, + BS Extremities- no clubbing, cyanosis, or edema Skin- no rash or lesion Psych- euthymic mood, full affect Neuro- strength  and sensation are intact  LABS: Basic Metabolic Panel:  Recent Labs  54/09/81 0353 09/12/13 0512  NA 138 139  K 4.1 3.3*  CL 104 101  CO2 25 26  GLUCOSE 91 115*  BUN 28* 23  CREATININE 1.02 0.90  CALCIUM 9.1 9.4  MG 1.9 1.9   RADIOLOGY: Dg Chest 2 View 09/12/2013 - Unchanged approximately 1.3 cm nodular opacity overlying the right upper lung - this apparent nodule remains indeterminate though was not definitively seen on recently performed chest radiograph  performed 07/27/2013. As such, a follow-up chest radiograph in 4 to 6 weeks after treatment is recommended to ensure resolution;  No new focal airspace opacities; Persistent findings of lung hyperexpansion, cardiomegaly, pulmonary venous congestion and trace bilateral effusions. No definite evidence of edema.  EKG - atrial fibrillation, ventricular rate 88, QTc 456  ASSESSMENT AND PLAN:  Active Problems:   Atrial fibrillation   CHF (congestive heart failure)  1. afib Failed medical therapy with flecainide  Continue coumadin Started tikosyn  NPO.  Cardioversion today, Dr. Marsa Aris.  2. EF 30% Likely tachycardia mediated Add losartan (presently has a cough already and therefore I am rectant to use an ace inhibitor).  Also prefer ARB given data suggesting improved  atrial remodeling with this therapy We need to perform stress testing once in sinus (likely as an outpatient) Stop lasix Will determine need for oral lasix after cardioversion  3. OSA Uses CPAP  4. HTN Stable No change required today  5. Cough Repeat CXR mucinex  She will need to remain in the hospital for tikosyn loading until tomorrow.  Angel Meyer.D.

## 2013-09-13 NOTE — Preoperative (Signed)
Beta Blockers   Reason not to administer Beta Blockers:Not Applicable 

## 2013-09-13 NOTE — Progress Notes (Signed)
Physical Therapy Treatment Patient Details Name: Angel Meyer MRN: [id number] DOB: 02/07/1933 Today's Date: 09/13/2013 Time: 0865-7846 PT Time Calculation (min): 30 min  PT Assessment / Plan / Recommendation  History of Present Illness 79 y.o. female with a h/o persistent atrial fibrillation, rheumatic fever at age 40, HTN, and sleep apnea who presents with complaint of progressive SOB   PT Comments   Pt continues to require cues for safe use of quad cane and for proper form with Rt shoulder exercises (pt tends to elevate Rt shoulder as exerting effort to complete task.  Follow Up Recommendations  Outpatient PT;Supervision/Assistance - 24 hour     Does the patient have the potential to tolerate intense rehabilitation     Barriers to Discharge        Equipment Recommendations  None recommended by PT    Recommendations for Other Services    Frequency Min 3X/week   Progress towards PT Goals Progress towards PT goals: Progressing toward goals  Plan Current plan remains appropriate    Precautions / Restrictions Precautions Precautions: Fall;Shoulder Type of Shoulder Precautions: Rt shoulder replacement 10/14 Restrictions RUE Weight Bearing: Non weight bearing Other Position/Activity Restrictions: recent shoulder replacement   Pertinent Vitals/Pain 8/10 Rt shoulder after abdct exercise "but it's a good hurt. I'm glad we moved my arm" Repositioned at end of session    Mobility  Bed Mobility Bed Mobility: Left Sidelying to Sit;Sitting - Scoot to Edge of Bed;Sit to Supine Left Sidelying to Sit: 5: Supervision Sitting - Scoot to Edge of Bed: 5: Supervision Sit to Supine: 6: Modified independent (Device/Increase time) Details for Bed Mobility Assistance: incr time/effort Transfers Transfers: Sit to Stand;Stand to Sit Sit to Stand: 4: Min guard Stand to Sit: 4: Min guard Details for Transfer Assistance: with [facility]; very cautious Ambulation/Gait Ambulation/Gait Assistance: 4: Min  guard Ambulation Distance (Feet): 160 Feet Assistive device: Large base quad cane Ambulation/Gait Assistance Details: vc for sequencing, although once corrected pt recalled "that's what she told me to do" (another PT); very slow, deliberate; LLE externally rotates Gait Pattern: Step-through pattern;Decreased stride length;Wide base of support Gait velocity: slow/guarded    Exercises Shoulder Exercises Shoulder Flexion: AROM;AAROM;Right;10 reps;Supine Shoulder ABduction: AAROM;Right;10 reps;Sidelying Shoulder External Rotation: AROM;Right;10 reps;Sidelying Other Exercises Other Exercises: bil shoulder flexion with cane in supine x 10AAROM   PT Diagnosis:    PT Problem List:   PT Treatment Interventions:     PT Goals (current goals can now be found in the care plan section)    Visit Information  Last PT Received On: 09/13/13 Assistance Needed: +1 History of Present Illness: 79 y.o. female with a h/o persistent atrial fibrillation, rheumatic fever at age 48, HTN, and sleep apnea who presents with complaint of progressive SOB    Subjective Data  Subjective: "I don't think I've done this one before" (and clearly has)   Cognition  Cognition Arousal/Alertness: Awake/alert Behavior During Therapy: WFL for tasks assessed/performed Overall Cognitive Status: Within Functional Limits for tasks assessed Memory: Decreased short-term memory    Balance     End of Session PT - End of Session Equipment Utilized During Treatment: Gait belt Activity Tolerance: Patient tolerated treatment well Patient left: with call bell/phone within reach;with family/visitor present;Other (comment) (sit EOB with dinner tray)   GP     Angel Meyer 09/13/2013, 5:01 PM Pager [phone]

## 2013-09-14 ENCOUNTER — Inpatient Hospital Stay (HOSPITAL_COMMUNITY): Payer: Medicare Other

## 2013-09-14 DIAGNOSIS — E785 Hyperlipidemia, unspecified: Secondary | ICD-10-CM

## 2013-09-14 DIAGNOSIS — R911 Solitary pulmonary nodule: Secondary | ICD-10-CM

## 2013-09-14 DIAGNOSIS — J069 Acute upper respiratory infection, unspecified: Secondary | ICD-10-CM

## 2013-09-14 LAB — BASIC METABOLIC PANEL
BUN: 20 mg/dL (ref 6–23)
CO2: 24 mEq/L (ref 19–32)
Chloride: 108 mEq/L (ref 96–112)
Creatinine, Ser: 0.82 mg/dL (ref 0.50–1.10)
GFR calc Af Amer: 77 mL/min — ABNORMAL LOW (ref >90)
GFR calc non Af Amer: 67 mL/min — ABNORMAL LOW (ref >90)
Glucose, Bld: 88 mg/dL (ref 70–99)
Potassium: 4.4 mEq/L (ref 3.5–5.1)

## 2013-09-14 LAB — PULMONARY FUNCTION TEST
DL/VA % pred: 89 %
DL/VA: 3.79 ml/min/mmHg/L
DLCO cor % pred: 67 %
DLCO cor: 12.67 ml/min/mmHg
DLCO unc: 11.73 ml/min/mmHg
FEV1-%Pred-Pre: 98 %
FEV6-%Pred-Pre: 97 %
FEV6-Pre: 2.04 L
FVC-%Pred-Pre: 94 %
Pre FEV6/FVC Ratio: 98 %

## 2013-09-14 MED ORDER — FUROSEMIDE 40 MG PO TABS: 40.0000 mg | ORAL_TABLET | Freq: Every day | ORAL | Status: DC

## 2013-09-14 MED ORDER — WARFARIN SODIUM 2.5 MG PO TABS: 2.5000 mg | ORAL_TABLET | Freq: Every day | ORAL | Status: DC

## 2013-09-14 MED ORDER — HYDROCODONE-HOMATROPINE 5-1.5 MG/5ML PO SYRP: 5.0000 mL | ORAL_SOLUTION | Freq: Two times a day (BID) | ORAL | Status: DC

## 2013-09-14 MED ORDER — CARVEDILOL 25 MG PO TABS: 25.0000 mg | ORAL_TABLET | Freq: Two times a day (BID) | ORAL | Status: DC

## 2013-09-14 MED ORDER — POTASSIUM CHLORIDE CRYS ER 20 MEQ PO TBCR: 20.0000 meq | EXTENDED_RELEASE_TABLET | Freq: Every day | ORAL | Status: DC

## 2013-09-14 MED ORDER — DILTIAZEM HCL ER COATED BEADS 180 MG PO CP24
180.0000 mg | ORAL_CAPSULE | Freq: Every day | ORAL | Status: DC
  Administered 2013-09-14: 180 mg via ORAL
  Filled 2013-09-14 (×2): qty 1

## 2013-09-14 MED ORDER — CARVEDILOL 25 MG PO TABS
25.0000 mg | ORAL_TABLET | Freq: Two times a day (BID) | ORAL | Status: DC
  Filled 2013-09-14 (×2): qty 1

## 2013-09-14 MED ORDER — MOMETASONE FURO-FORMOTEROL FUM 100-5 MCG/ACT IN AERO: 2.0000 | INHALATION_SPRAY | Freq: Two times a day (BID) | RESPIRATORY_TRACT | Status: DC

## 2013-09-14 NOTE — Consult Note (Signed)
Name: Angel Meyer MRN: [id number] DOB: 03-16-1933    ADMISSION DATE:  09/08/2013 CONSULTATION DATE:  12-19  REFERRING MD :  Cards PRIMARY SERVICE: Cards  CHIEF COMPLAINT:  cough  BRIEF PATIENT DESCRIPTION:  [name] with a 7 year history of documented sleep apnea who is non compliant with cpap, yearly cough that worsens with seasonal changes, long history of secondhand tobacco exposure presents with 8 wks of cough starting as a URI, treated with abx, steroid course, advair without relief. PCCM asked to give opinion on the use of amiodarone for AF-RVR  SIGNIFICANT EVENTS / STUDIES:    LINES / TUBES:   CULTURES:   ANTIBIOTICS:   HISTORY OF PRESENT ILLNESS:   11 yo WF with a 7 year history of documented sleep apnea who is non compliant with cpap, yearly cough that worsens with seasonal changes, long history of secondhand tobacco exposure. She has refractory Afib and has been on amiodarone in past but was stopped due to cough. She is not on OXYGEN THERAPY and has been on dry powder inhalers(advair) per PCP for reactive airway disease. She reports being tired all the time, chronic cough that has been treated with abx, steroids and narcotic cough suppressants. Cough is productive of non purulent white frothy sputum at times.  She notes ankle edema when cough is worse. PCCM asked to give opinion on the use of amiodarone for rate control. She has been on this in the past but off x at least 1 year. CXR suggested bibasl infx & RUL nodule  PAST MEDICAL HISTORY :  Past Medical History  Diagnosis Date  . Arthritis   . Persistent atrial fibrillation   . Renal calculi   . Hypertension   . Hyperlipidemia   . Rheumatic fever     age 30  . History of blood transfusion   . Incontinence of urine   . Diastolic dysfunction     preserved EF,  CHF In setting of afib previously  . Osteoporosis   . OSA (obstructive sleep apnea)     uses CPAP  . Restless legs    Past Surgical History    Procedure Laterality Date  . Breast surgery      benign biopsy  . Tonsillectomy    . Abdominal hysterectomy    . Spine surgery      laminectomy 1965, spinal fusion with rod 2005  . Replacement total knee  2005  . Kidney stone surgery      1997 removed  . Cardioversion N/A 09/10/2013    Procedure: CARDIOVERSION;  Surgeon: Hillis Range, MD;  Location: [facility] OR;  Service: Cardiovascular;  Laterality: N/A;   Prior to Admission medications   Medication Sig Start Date End Date Taking? Authorizing Provider  acetaminophen (TYLENOL) 650 MG CR tablet Take 650 mg by mouth every 8 (eight) hours as needed for pain.    Yes Historical Provider, MD  carvedilol (COREG) 3.125 MG tablet Take 1 tablet (3.125 mg total) by mouth 2 (two) times daily with a meal. 09/05/13  Yes Kristian Covey, MD  Cholecalciferol (VITAMIN D3) 2000 UNITS TABS Take by mouth. Once a day   Yes Historical Provider, MD  dextromethorphan-guaiFENesin (MUCINEX DM) 30-600 MG per 12 hr tablet Take 1 tablet by mouth 2 (two) times daily.   Yes Historical Provider, MD  diltiazem (CARTIA XT) 180 MG 24 hr capsule Take 180 mg by mouth daily.   Yes Historical Provider, MD  docusate sodium (COLACE) 100 MG capsule  Take 100 mg by mouth daily as needed for mild constipation.   Yes Historical Provider, MD  escitalopram (LEXAPRO) 10 MG tablet Take 1 tablet (10 mg total) by mouth daily. 08/22/13  Yes Kristian Covey, MD  famotidine (PEPCID) 20 MG tablet Take 20 mg by mouth 2 (two) times daily.   Yes Historical Provider, MD  Fluticasone-Salmeterol (ADVAIR) 250-50 MCG/DOSE AEPB Inhale 1 puff into the lungs 2 (two) times daily.   Yes Historical Provider, MD  HYDROcodone-acetaminophen (NORCO) 7.5-325 MG per tablet Take 1 tablet by mouth every 6 (six) hours as needed for pain.   Yes Historical Provider, MD  HYDROcodone-homatropine (HYCODAN) 5-1.5 MG/5ML syrup Take 5 mLs by mouth every 6 (six) hours as needed for cough. 08/07/13  Yes Madelin Headings, MD   Omega-3 Fatty Acids (FISH OIL) 1200 MG CAPS Take by mouth. Twice a day   Yes Historical Provider, MD  oxybutynin (DITROPAN-XL) 10 MG 24 hr tablet Take 1 tablet (10 mg total) by mouth daily. 08/22/13  Yes Kristian Covey, MD  ranitidine (ZANTAC) 150 MG tablet Take 150 mg by mouth 2 (two) times daily.   Yes Historical Provider, MD  rOPINIRole (REQUIP) 1 MG tablet Take 1 tablet (1 mg total) by mouth 2 (two) times daily. 08/22/13  Yes Kristian Covey, MD  rosuvastatin (CRESTOR) 40 MG tablet Take 1 tablet (40 mg total) by mouth daily. 08/22/13  Yes Kristian Covey, MD   Allergies  Allergen Reactions  . Ciprofloxacin   . Demerol [Meperidine]   . Sulfa Antibiotics     FAMILY HISTORY:  Family History  Problem Relation Age of Onset  . Cancer Mother     breast  . Diabetes Mother   . Cancer Paternal Grandfather    SOCIAL HISTORY:  reports that she has never smoked. She does not have any smokeless tobacco history on file. She reports that she drinks alcohol. She reports that she does not use illicit drugs.  REVIEW OF SYSTEMS: 10 point review of system taken, please see HPI for positives and negatives.   SUBJECTIVE:   VITAL SIGNS: Temp:  [96.9 F (36.1 C)-97.4 F (36.3 C)] 96.9 F (36.1 C) (12/19 0631) Pulse Rate:  [77-106] 106 (12/19 0631) Resp:  [18] 18 (12/19 0631) BP: (101-124)/(67-85) 124/85 mmHg (12/19 0631) SpO2:  [95 %-96 %] 96 % (12/19 0631) Weight:  [177 lb 11.1 oz (80.6 kg)] 177 lb 11.1 oz (80.6 kg) (12/19 0631) HEMODYNAMICS:   VENTILATOR SETTINGS:   INTAKE / OUTPUT: Intake/Output     12/18 0701 - 12/19 0700 12/19 0701 - 12/20 0700   P.O. 480 120   Total Intake(mL/kg) 480 (6) 120 (1.5)   Urine (mL/kg/hr) 300 (0.2)    Stool     Total Output 300     Net +180 +120        Urine Occurrence 2 x      PHYSICAL EXAMINATION: General:  WNWD tired looking WF Neuro:  Intact HEENT:  No JVD/LAN Cardiovascular:  HSIR Lungs:  Decreased in bases Abdomen:  +bs , soft  non tender Musculoskeletal:  Recent rt arm fx Skin: warm ++ le edema (bnp 2400)  Intake/Output Summary (Last 24 hours) at 09/14/13 0928 Last data filed at 09/14/13 0800  Gross per 24 hour  Intake    600 ml  Output    300 ml  Net    300 ml     LABS:  CBC  Recent Labs Lab 09/08/13 1433 09/09/13 0505  WBC 4.2 3.8*  HGB 11.7* 11.2*  HCT 33.7* 33.4*  PLT 150 140*   Coag's  Recent Labs Lab 09/12/13 0512 09/13/13 0643 09/14/13 0350  INR 2.83* 2.77* 2.83*   BMET  Recent Labs Lab 09/12/13 0512 09/13/13 0643 09/14/13 0350  NA 139 137 141  K 3.3* 4.9 4.4  CL 101 103 108  CO2 26 23 24   BUN 23 23 20   CREATININE 0.90 0.86 0.82  GLUCOSE 115* 88 88   Electrolytes  Recent Labs Lab 09/12/13 0512 09/13/13 0643 09/14/13 0350  CALCIUM 9.4 9.8 9.3  MG 1.9 2.1 2.3   Sepsis Markers No results found for this basename: LATICACIDVEN, PROCALCITON, O2SATVEN,  in the last 168 hours ABG No results found for this basename: PHART, PCO2ART, PO2ART,  in the last 168 hours Liver Enzymes No results found for this basename: AST, ALT, ALKPHOS, BILITOT, ALBUMIN,  in the last 168 hours Cardiac Enzymes  Recent Labs Lab 09/08/13 1433  PROBNP 2410.0*   Glucose No results found for this basename: GLUCAP,  in the last 168 hours  Imaging No results found.   CXR:   ASSESSMENT / PLAN:  PULMONARY A:OSA, Second hand smoke exposure, Question is amiodarone an option. Sub-acute / Chronic cough - may be related to post nasal drip, doubt ILD , CXR does suggest bibasl infx but I do not hear rales P:   Wear Cpap Stop home advair Cough suppression - Tussionex OK short term -5ml po bid  - discussed side ffects She has been amio in past and it was stopped due to cough. See HPI please. Pulmonary outpatient follow up scheduled. May need treatment for Cyclic Cough. Proceed with HRCT & DLCO (to establish baseline) If HRCT does not show fibrosis, OK to start  amio     CARDIOVASCULAR A: Afib, CHF(ef 30%) P:  Per cards Please do not use ACe in this pt - OK to use ARB    Brett Canales Minor ACNP Adolph Pollack PCCM Pager [phone] till 3 pm If no answer page [phone]  Independently examined pt, evaluated data & formulated above care plan with NP who scribed this note & edited by me.  Discussed with pt & daughter, FU appt made to discuss results of tests.  Cyril Mourning MD. Tonny Bollman. [facility] Pager [phone] If no response call [phone]   09/14/2013, 9:24 AM

## 2013-09-14 NOTE — Progress Notes (Signed)
Patient refused Tikosyn. Stated she was told by the nurse practitioner that it has been discontinued. Will like to speak with her doctor before resuming it or not.

## 2013-09-14 NOTE — Progress Notes (Signed)
Pharmacy Note-Anticoagulation  Pharmacy Consult :  79 y.o. female is currently on chronic Coumadin  for persistent atrial fibrillation .   Latest Labs : Hematology : Lab Results  Component Value Date   INR 2.83* 09/14/2013   INR 2.77* 09/13/2013   INR 2.83* 09/12/2013    Current Medication[s] Include: Scheduled:  Scheduled:  . carvedilol  25 mg Oral BID WC  . dextromethorphan-guaiFENesin  1 tablet Oral BID  . diltiazem  180 mg Oral Daily  . escitalopram  10 mg Oral Daily  . famotidine  20 mg Oral Daily  . mometasone-formoterol  2 puff Inhalation BID  . oxybutynin  10 mg Oral Daily  . potassium chloride  40 mEq Oral Daily  . rOPINIRole  1 mg Oral BID  . sodium chloride  3 mL Intravenous Q12H  . sodium chloride  3 mL Intravenous Q12H  . sodium chloride  3 mL Intravenous Q12H  . warfarin  5 mg Oral q1800  . Warfarin - Pharmacist Dosing Inpatient   Does not apply q1800   Assessment :  Today's INR is 2.83.   INR is stable on Home dosing schedule.    No bleeding complications observed.  Goal :  INR goal is 2-3    Plan : 1. Continue Home Coumadin schedule.   2. Change INR's to every other day.  Monitor for bleeding complications   Laurena Bering, Pharm.D. 09/14/2013  8:59 AM

## 2013-09-14 NOTE — Discharge Summary (Addendum)
ELECTROPHYSIOLOGY DISCHARGE SUMMARY    Patient ID: Angel Meyer,  MRN: [id number], DOB/AGE: November 13, 1932 79 y.o.  Admit date: 09/08/2013 Discharge date: 09/14/2013  Primary Care Physician: Evelena Peat, MD Primary Cardiologist: Hillis Range, MD  Primary Discharge Diagnosis:  1. Atrial fibrillation - failed medical therapy with flecainide and tikosyn  - discharge on rate control and Coumadin 2. New LV dysfunction, EF 30% - acute systolic dysfunction - likely tachycardia mediated  - Coreg added; consider adding losartan as BP allows (SBP 100-124 in hospital); ARB perferred (versus ACEI) given data suggesting improved atrial remodeling with this therapy  - will need to perform stress testing as an outpatient - start lasix 40mg  PO daily  3. Persistent cough with abnormal chest x-ray - PCCM consult done and outpatient follow-up arranged - consider starting amiodarone as an outpatient if ok with Pulmonary   Secondary Discharge Diagnoses:  1. OSA 2. HTN 3. Dyslipidemia 4. History of rheumatic fever at age 79  Procedures This Admission:  1. DCCV x 2 - on 09/10/2013 and 09/13/2013 - both unsucessful for restoring SR 2. 2D echo Study Conclusions - Left ventricle: The cavity size was normal. Wall thickness was normal. Systolic function was moderately to severely reduced. The estimated ejection fraction was in the range of 30% to 35%. Diffuse hypokinesis. - Left atrium: The atrium was moderately dilated. 51 mm. - Right atrium: The atrium was moderately dilated. - Pulmonary arteries: PA peak pressure: 33mm Hg (S).  History and Hospital Course:  Angel Meyer is a 79 year old woman with persistent AF, rheumatic fever at age 28, HTN and OSA who presented on 09/08/2013 with progressive dyspnea, orthopnea, cough and congestion over the past 6-8 weeks. On admission she was found to have AF w/RVR and volume overload. Her INR was therapeutic. She was given flecanide x 1 which was unsuccessful  for restoring SR. On 09/10/2013, she underwent DCCV which was unsuccessful. She remained in AF. Flecainide was discontinued. An echo was done revealing LV dysfunction, EF 30%, LA dimension 51 mm and PA pressure 33 mmHg. Dr. Johney Frame discussed options with Ms. Dea including AAD drug loading with Tikosyn. She elected to remain in the hospital for Tikosyn loading. After washout from flecainide and repletion of potassium and magnesium, Tikosyn was started per protocol. Given her LV dysfunction, Coreg was added and up-titrated. Losartan to be added as an outpatient as BP allows.  She was found to have acute systolic dysfunction with reduced EF by echo. She was diuresed with improvement in her SOB. On 09/13/2013 she underwent DCCV which was unsuccessful. Tikosyn was discontinued. She was continued on rate control and Coumadin. She had persistent cough and PCCM consult obtained, high resolution CT was done and outpatient follow-up arranged. She has been seen, examined and deemed stable for discharge today by Dr. Hillis Range. She will see Pulmonary as an outpatient in the next 1-2 weeks to determine if amiodarone is an option for management of her AF. Given her advanced age, comorbidities and decreased anticipated success rates, Dr. Johney Frame did not advise AF ablation at this time. She will follow-up with Norma Fredrickson, NP in 1-2 weeks.   Discharge Vitals: Blood pressure 124/85, pulse 106, temperature 96.9 F (36.1 C), temperature source Oral, resp. rate 18, height 5' (1.524 m), weight 177 lb 11.1 oz (80.6 kg), SpO2 96.00%.   Labs: Lab Results  Component Value Date   WBC 3.8* 09/09/2013   HGB 11.2* 09/09/2013   HCT 33.4* 09/09/2013   MCV 91.5 09/09/2013  PLT 140* 09/09/2013     Recent Labs Lab 09/14/13 0350  NA 141  K 4.4  CL 108  CO2 24  BUN 20  CREATININE 0.82  CALCIUM 9.3  GLUCOSE 88    Recent Labs  09/14/13 0350  INR 2.83*    Disposition:  The patient is being discharged in stable  condition.  Follow-up:     Follow-up Information   Follow up with Norma Fredrickson, NP On 10/08/2013. (At 10:30 AM for hospital follow-up)    Specialty:  Nurse Practitioner   Contact information:   [address] [place] [address] [phone]       Follow up with PARRETT,TAMMY, NP On 10/03/2013. (At 9:30 AM)    Specialty:  Nurse Practitioner   Contact information:   [address] [place] [address] [phone]       Follow up with Kristian Covey, MD On 09/17/2013. (At 9:00 AM for Coumadin follow-up)    Specialty:  Family Medicine   Contact information:   [address] [place] [address] [phone]      Discharge Medications:    Medication List    STOP taking these medications       Fluticasone-Salmeterol 250-50 MCG/DOSE Aepb  Commonly known as:  ADVAIR  Replaced by:  mometasone-formoterol 100-5 MCG/ACT Aero     ranitidine 150 MG tablet  Commonly known as:  ZANTAC      TAKE these medications       acetaminophen 650 MG CR tablet  Commonly known as:  TYLENOL  Take 650 mg by mouth every 8 (eight) hours as needed for pain.     CARTIA XT 180 MG 24 hr capsule  Generic drug:  diltiazem  Take 180 mg by mouth daily.     carvedilol 25 MG tablet  Commonly known as:  COREG  Take 1 tablet (25 mg total) by mouth 2 (two) times daily with a meal.     dextromethorphan-guaiFENesin 30-600 MG per 12 hr tablet  Commonly known as:  MUCINEX DM  Take 1 tablet by mouth 2 (two) times daily.     docusate sodium 100 MG capsule  Commonly known as:  COLACE  Take 100 mg by mouth daily as needed for mild constipation.     escitalopram 10 MG tablet  Commonly known as:  LEXAPRO  Take 1 tablet (10 mg total) by mouth daily.     famotidine 20 MG tablet  Commonly known as:  PEPCID  Take 20 mg by mouth 2 (two) times daily.     Fish Oil 1200 MG Caps  Take by mouth. Twice a day     furosemide 40 MG tablet  Commonly known as:  LASIX  Take 1  tablet (40 mg total) by mouth daily.     HYDROcodone-acetaminophen 7.5-325 MG per tablet  Commonly known as:  NORCO  Take 1 tablet by mouth every 6 (six) hours as needed for pain.     HYDROcodone-homatropine 5-1.5 MG/5ML syrup  Commonly known as:  HYCODAN  Take 5 mLs by mouth 2 (two) times daily.     mometasone-formoterol 100-5 MCG/ACT Aero  Commonly known as:  DULERA  Inhale 2 puffs into the lungs 2 (two) times daily.     oxybutynin 10 MG 24 hr tablet  Commonly known as:  DITROPAN-XL  Take 1 tablet (10 mg total) by mouth daily.     potassium chloride SA 20 MEQ tablet  Commonly known as:  K-DUR,KLOR-CON  Take 1 tablet (20 mEq total) by mouth daily.     rOPINIRole 1 MG tablet  Commonly known as:  REQUIP  Take 1 tablet (1 mg total) by mouth 2 (two) times daily.     rosuvastatin 40 MG tablet  Commonly known as:  CRESTOR  Take 1 tablet (40 mg total) by mouth daily.     Vitamin D3 2000 UNITS Tabs  Take by mouth. Once a day     warfarin 2.5 MG tablet  Commonly known as:  COUMADIN  Take 1-2 tablets (2.5-5 mg total) by mouth daily. Take 1 tablet (2.5 mg) once daily on Mon, Wed and Friday. Take 2 tablets (5 mg) on all other days.       Duration of Discharge Encounter: Greater than 30 minutes including physician time.  Limmie Patricia, PA-C 09/14/2013, 1:53 PM   Hillis Range MD

## 2013-09-14 NOTE — Progress Notes (Signed)
SUBJECTIVE: The patient is doing ok today.  Still with non productive cough and sinus congestion   Failed DCCV yesterday on Tikosyn  Pulmonary asked to see patient yesterday.   CURRENT MEDICATIONS: . carvedilol  12.5 mg Oral BID WC  . dextromethorphan-guaiFENesin  1 tablet Oral BID  . diltiazem  240 mg Oral Daily  . escitalopram  10 mg Oral Daily  . famotidine  20 mg Oral Daily  . mometasone-formoterol  2 puff Inhalation BID  . oxybutynin  10 mg Oral Daily  . potassium chloride  40 mEq Oral Daily  . rOPINIRole  1 mg Oral BID  . sodium chloride  3 mL Intravenous Q12H  . sodium chloride  3 mL Intravenous Q12H  . sodium chloride  3 mL Intravenous Q12H  . warfarin  5 mg Oral q1800  . Warfarin - Pharmacist Dosing Inpatient   Does not apply q1800   . sodium chloride    . sodium chloride      OBJECTIVE: Physical Exam: Filed Vitals:   09/13/13 1143 09/13/13 1359 09/13/13 2225 09/14/13 0631  BP: 115/77 105/69 101/67 124/85  Pulse:  86 77 106  Temp:  97.3 F (36.3 C) 97.4 F (36.3 C) 96.9 F (36.1 C)  TempSrc:  Oral Oral Oral  Resp:  18 18 18   Height:      Weight:    177 lb 11.1 oz (80.6 kg)  SpO2:  95% 96% 96%    Intake/Output Summary (Last 24 hours) at 09/14/13 8657 Last data filed at 09/13/13 1700  Gross per 24 hour  Intake    480 ml  Output    300 ml  Net    180 ml    Telemetry reveals atrial fibrillation, ventricular rates 90-100  GEN- The patient is well appearing, alert and oriented x 3 today.  Coughs frequently Head- normocephalic, atraumatic Eyes-  Sclera clear, conjunctiva pink Ears- hearing intact Oropharynx- clear Neck- supple,   Lungs- Clear to ausculation bilaterally, normal work of breathing Heart- irregular rate and rhythm, no murmurs, rubs or gallops, PMI not laterally displaced GI- soft, NT, ND, + BS Extremities- no clubbing, cyanosis, or edema Skin- no rash or lesion Psych- euthymic mood, full affect Neuro- strength and sensation are  intact  LABS: Basic Metabolic Panel:  Recent Labs  84/69/62 0643 09/14/13 0350  NA 137 141  K 4.9 4.4  CL 103 108  CO2 23 24  GLUCOSE 88 88  BUN 23 20  CREATININE 0.86 0.82  CALCIUM 9.8 9.3  MG 2.1 2.3   RADIOLOGY: Dg Chest 2 View 09/12/2013 - Unchanged approximately 1.3 cm nodular opacity overlying the right upper lung - this apparent nodule remains indeterminate though was not definitively seen on recently performed chest radiograph  performed 07/27/2013. As such, a follow-up chest radiograph in 4 to 6 weeks after treatment is recommended to ensure resolution;  No new focal airspace opacities; Persistent findings of lung hyperexpansion, cardiomegaly, pulmonary venous congestion and trace bilateral effusions. No definite evidence of edema.  EKG - atrial fibrillation, ventricular rate 88, QTc 456  ASSESSMENT AND PLAN:  Active Problems:   Atrial fibrillation   CHF (congestive heart failure)  1. afib Failed medical therapy with flecainide and tikosyn Will discharge on rate control Increase coreg and decrease diltiazem Continue coumadin  2. EF 30% Likely tachycardia mediated Add losartan (presently has a cough already and therefore I am rectant to use an ace inhibitor).  Also prefer ARB given data suggesting improved atrial  remodeling with this therapy We need to perform stress testing once in sinus (likely as an outpatient) once she is clinically improved Start lasix 40mg  po daily Daily weights and 2 gram sodium restriction Decrease diltiazem and increase coreg  3. OSA Uses CPAP  4. HTN Stable No change required today  5. Cough/ abnormal cxr I will ask pulmonary to see while here and follow as an outpatient. I would like for pulmonary to help me assess to see if she is a candidate for amiodarone which is our only antiarrhythmic option for her afib.  Given her advanced age, comorbidities, and decreased anticipated success rates, i would not advise ablation at this  time.  She will followup with Norma Fredrickson in 1-2 weeks. Consider starting amiodarone as an outpatient if ok with pulmonary at that time.  DC to home later today.

## 2013-09-14 NOTE — Progress Notes (Signed)
Pt is being discharged home. Pt is being transported home by her daughter. Pt has been provided with discharge instructions. RN answered all questions the patient had.

## 2013-09-17 ENCOUNTER — Ambulatory Visit (INDEPENDENT_AMBULATORY_CARE_PROVIDER_SITE_OTHER): Payer: Medicare Other | Admitting: General Practice

## 2013-09-17 ENCOUNTER — Ambulatory Visit: Payer: Medicare Other

## 2013-09-17 DIAGNOSIS — I4891 Unspecified atrial fibrillation: Secondary | ICD-10-CM

## 2013-09-17 NOTE — Progress Notes (Signed)
Pre-visit discussion using our clinic review tool. No additional management support is needed unless otherwise documented below in the visit note.  

## 2013-10-01 ENCOUNTER — Ambulatory Visit: Payer: Medicare Other | Admitting: Nurse Practitioner

## 2013-10-03 ENCOUNTER — Encounter: Payer: Self-pay | Admitting: Adult Health

## 2013-10-03 ENCOUNTER — Ambulatory Visit (INDEPENDENT_AMBULATORY_CARE_PROVIDER_SITE_OTHER): Payer: Medicare Other | Admitting: Adult Health

## 2013-10-03 VITALS — BP 114/74 | HR 85 | Temp 97.9°F | Ht 61.25 in | Wt 187.0 lb

## 2013-10-03 DIAGNOSIS — R059 Cough, unspecified: Secondary | ICD-10-CM | POA: Diagnosis not present

## 2013-10-03 DIAGNOSIS — R05 Cough: Secondary | ICD-10-CM | POA: Diagnosis not present

## 2013-10-03 DIAGNOSIS — R911 Solitary pulmonary nodule: Secondary | ICD-10-CM | POA: Diagnosis not present

## 2013-10-03 DIAGNOSIS — G4733 Obstructive sleep apnea (adult) (pediatric): Secondary | ICD-10-CM

## 2013-10-03 NOTE — Assessment & Plan Note (Signed)
Mild OSA on sleep study (2010 )  Would use CPAP At bedtime   Encouraged compliance

## 2013-10-03 NOTE — Assessment & Plan Note (Signed)
Incidental finding on 09/14/13 CT chest -RUL nodule 11 mm (never smoker ) >consider repeat CT in 3-6 months

## 2013-10-03 NOTE — Assessment & Plan Note (Signed)
Improved with treatment ? Post viral cough/ ? RAD  PFT w/ no airlflow obstruction (no post done )  No ILD /fibrosis on CT scan -ok for Amio  Cont on [facility] for now - could d/c on return if cough resolves  May use Delsym

## 2013-10-03 NOTE — Patient Instructions (Signed)
Wear CPAP as much as possible At bedtime   Continue on Dulera 1 puff Twice daily  , rinse after use.  Follow up Dr. Elsworth Soho  In 4-6 weeks and As needed

## 2013-10-03 NOTE — Progress Notes (Signed)
Subjective:    Patient ID: Angel Meyer, female    DOB: 1933-07-28, 80 y.o.   MRN: [id number]  HPI 17 yo WF with a 7 year history of documented sleep apnea who is non compliant with cpap, yearly cough that worsens with seasonal changes, long history of secondhand tobacco admitted to hospital 09/08/13 for Atrial fib -RVR . Seen by PCCM for consult for cough /bronchits  And consideration for Amiodarone therapy.   10/03/2013 [facility] follow up  Returns for follow up after hospitalization for Atrial Fib w/ RVR 09/08/13 . PCCM consult for cough and dyspnea.  She has a 7 year history of documented sleep apnea who is non compliant with cpap, yearly cough that worsens with seasonal changes, long history of secondhand tobacco exposure. She has refractory Afib and has been on amiodarone in past but was stopped due to cough. Was started on Advair PTA for cough /RAD . Says she has been tx for chronic cough w/ abx, steroids and narcotic cough suppressants.  PCCM asked to give opinion on the use of amiodarone for rate control.. CXR suggested bibasl infx & RUL nodule . CT scan was done that did not show fibrosis .  PFT showed no airflow obstruction . She was approved for Amiodarone start.  DLCO slightly was slightly diminshed at ~62%.  She was started on Dulera. Says since discharge she is feeling better. Cough is much better. Some lingering dry cough.  Sleep study copy from 2010 showed mild OSA (AHI 8.2/h) with severe RLS (on requip) . She says she does not like to wear it. We talked about the benefits and encouraged her to retry in the setting of difficult to control A fib .    Review of Systems Constitutional:   No  weight loss, night sweats,  Fevers, chills, +fatigue, or  lassitude.  HEENT:   No headaches,  Difficulty swallowing,  Tooth/dental problems, or  Sore throat,                No sneezing, itching, ear ache, nasal congestion, post nasal drip,   CV:  No chest pain,  Orthopnea, PND, swelling in  lower extremities, anasarca, dizziness, palpitations, syncope.   GI  No heartburn, indigestion, abdominal pain, nausea, vomiting, diarrhea, change in bowel habits, loss of appetite, bloody stools.   Resp: No coughing up of blood.  No change in color of mucus.  No wheezing.  No chest wall deformity  Skin: no rash or lesions.  GU: no dysuria, change in color of urine, no urgency or frequency.  No flank pain, no hematuria   MS:  No joint pain or swelling.  No decreased range of motion.  No back pain.  Psych:  No change in mood or affect. No depression or anxiety.  No memory loss.         Objective:   Physical Exam GEN: A/Ox3; pleasant , NAD, elderly walks with cane   HEENT:  [place]/AT,  EACs-clear, TMs-wnl, NOSE-clear, THROAT-clear, no lesions, no postnasal drip or exudate noted.   NECK:  Supple w/ fair ROM; no JVD; normal carotid impulses w/o bruits; no thyromegaly or nodules palpated; no lymphadenopathy.  RESP  Clear  P & A; w/o, wheezes/ rales/ or rhonchi.no accessory muscle use, no dullness to percussion  CARD:  Irreg Irreg ,  no peripheral edema, pulses intact, no cyanosis or clubbing.  GI:   Soft & nt; nml bowel sounds; no organomegaly or masses detected.  Musco: Warm bil, no deformities  or joint swelling noted.   Neuro: alert, no focal deficits noted.    Skin: Warm, no lesions or rashes   CT chest 09/14/13  No definite findings to suggest an underlying interstitial lung  disease at this time.  2. There is a small area in the right upper lobe which likely  represents a region of post infectious/inflammatory fibrosis,  described above. This is most likely to account for the perceived  nodular opacity in the right upper lobe on the recent chest x-ray  09/12/2013.  3. In addition, there is a more peripheral 11 mm sub solid nodule in  the right upper lobe which is unlikely to be visualized on chest  radiographs. This will require CT imaging followup      Assessment &  Plan:

## 2013-10-04 ENCOUNTER — Ambulatory Visit (INDEPENDENT_AMBULATORY_CARE_PROVIDER_SITE_OTHER): Payer: Medicare Other | Admitting: General Practice

## 2013-10-04 DIAGNOSIS — I4891 Unspecified atrial fibrillation: Secondary | ICD-10-CM

## 2013-10-04 LAB — POCT INR: INR: 1.5

## 2013-10-04 NOTE — Progress Notes (Signed)
Pre-visit discussion using our clinic review tool. No additional management support is needed unless otherwise documented below in the visit note.  

## 2013-10-08 ENCOUNTER — Ambulatory Visit (INDEPENDENT_AMBULATORY_CARE_PROVIDER_SITE_OTHER): Payer: Medicare Other | Admitting: Nurse Practitioner

## 2013-10-08 ENCOUNTER — Encounter: Payer: Self-pay | Admitting: Nurse Practitioner

## 2013-10-08 VITALS — BP 140/80 | HR 93 | Ht 61.0 in | Wt 186.4 lb

## 2013-10-08 DIAGNOSIS — I4891 Unspecified atrial fibrillation: Secondary | ICD-10-CM

## 2013-10-08 DIAGNOSIS — R0600 Dyspnea, unspecified: Secondary | ICD-10-CM

## 2013-10-08 DIAGNOSIS — I5022 Chronic systolic (congestive) heart failure: Secondary | ICD-10-CM

## 2013-10-08 DIAGNOSIS — R0989 Other specified symptoms and signs involving the circulatory and respiratory systems: Secondary | ICD-10-CM | POA: Diagnosis not present

## 2013-10-08 DIAGNOSIS — R0609 Other forms of dyspnea: Secondary | ICD-10-CM | POA: Diagnosis not present

## 2013-10-08 LAB — CBC WITH DIFFERENTIAL/PLATELET
Basophils Absolute: 0 10*3/uL (ref 0.0–0.1)
Basophils Relative: 0.8 % (ref 0.0–3.0)
Eosinophils Absolute: 0.2 10*3/uL (ref 0.0–0.7)
Eosinophils Relative: 3.3 % (ref 0.0–5.0)
HCT: 37 % (ref 36.0–46.0)
Hemoglobin: 12.3 g/dL (ref 12.0–15.0)
Lymphocytes Relative: 29.2 % (ref 12.0–46.0)
Lymphs Abs: 1.6 10*3/uL (ref 0.7–4.0)
MCHC: 33.2 g/dL (ref 30.0–36.0)
MCV: 90.8 fl (ref 78.0–100.0)
Monocytes Absolute: 0.6 10*3/uL (ref 0.1–1.0)
Monocytes Relative: 11.6 % (ref 3.0–12.0)
Neutro Abs: 3 10*3/uL (ref 1.4–7.7)
Neutrophils Relative %: 55.1 % (ref 43.0–77.0)
Platelets: 122 10*3/uL — ABNORMAL LOW (ref 150.0–400.0)
RBC: 4.08 Mil/uL (ref 3.87–5.11)
RDW: 16.5 % — ABNORMAL HIGH (ref 11.5–14.6)
WBC: 5.4 10*3/uL (ref 4.5–10.5)

## 2013-10-08 LAB — BASIC METABOLIC PANEL
BUN: 21 mg/dL (ref 6–23)
CO2: 26 mEq/L (ref 19–32)
Calcium: 9.1 mg/dL (ref 8.4–10.5)
Chloride: 108 mEq/L (ref 96–112)
Creatinine, Ser: 0.9 mg/dL (ref 0.4–1.2)
GFR: 67.77 mL/min (ref >60.00)
Glucose, Bld: 85 mg/dL (ref 70–99)
Potassium: 3.9 mEq/L (ref 3.5–5.1)
Sodium: 142 mEq/L (ref 135–145)

## 2013-10-08 LAB — BRAIN NATRIURETIC PEPTIDE: Pro B Natriuretic peptide (BNP): 405 pg/mL — ABNORMAL HIGH (ref 0.0–100.0)

## 2013-10-08 MED ORDER — NITROGLYCERIN 0.4 MG SL SUBL: 0.4000 mg | SUBLINGUAL_TABLET | SUBLINGUAL | Status: DC | PRN

## 2013-10-08 MED ORDER — SPIRONOLACTONE 25 MG PO TABS: 25.0000 mg | ORAL_TABLET | Freq: Every day | ORAL | Status: DC

## 2013-10-08 MED ORDER — AMIODARONE HCL 200 MG PO TABS: 200.0000 mg | ORAL_TABLET | Freq: Two times a day (BID) | ORAL | Status: DC

## 2013-10-08 NOTE — Progress Notes (Signed)
Angel Meyer Date of Birth: [birth year] Medical Record #[id number]  History of Present Illness: Angel Meyer is seen back today for a post hospital visit. Seen for Dr. Rayann Heman. She is a 80 year old female with a history of AF, rheumatic fever at age 27, HTN and OSA.   Most recently presented with progressive dyspnea, orthopnea and cough for 6 to 8 weeks - noted to be in AF with RVR and volume overload. She has been treated with flecainide which was unsuccessful. Then placed on Tikosyn and was cardioverted - this also unsuccessful. Now managed with rate control and anticoagulation. Seen by PCCM for her cough/congestion and was to discuss possible amiodarone therapy. Echo shows her EF down to 30 to 35%.   Seen last week by [name], NP - "No ILD /fibrosis on CT scan -ok for Amio" noted in her note.     Comes in today. Here with her daughter. Using a cane. Moves slow. Has good days and bad days. Mostly feels bad at night. Has more PND/orthopnea - has to go and sit up to breathe better. Some chest pressure noted. Does not feel her heart racing/skipping, etc. Has cut her Lasix back to urinary frequency and no control - but this was an issue even before taking Lasix. Probably not limiting her salt. Daughter feels like she was on amiodarone in the past - for some reason stopped - they are both not sure if she has been in atrial fib the whole time since she moved here. Cough has improved. More swelling in her ankles. Weight is up at home.   Current Outpatient Prescriptions  Medication Sig Dispense Refill  . acetaminophen (TYLENOL) 650 MG CR tablet Take 650 mg by mouth every 8 (eight) hours as needed for pain.       . carvedilol (COREG) 25 MG tablet Take 1 tablet (25 mg total) by mouth 2 (two) times daily with a meal.  60 tablet  4  . Cholecalciferol (VITAMIN D3) 2000 UNITS TABS Take by mouth. Once a day      . dextromethorphan-guaiFENesin (MUCINEX DM) 30-600 MG per 12 hr tablet Take 1 tablet by mouth 2  (two) times daily as needed.       . diltiazem (CARTIA XT) 180 MG 24 hr capsule Take 180 mg by mouth daily.      Marland Kitchen docusate sodium (COLACE) 100 MG capsule Take 100 mg by mouth daily as needed for mild constipation.      Marland Kitchen escitalopram (LEXAPRO) 10 MG tablet Take 1 tablet (10 mg total) by mouth daily.  90 tablet  3  . famotidine (PEPCID) 20 MG tablet Take 20 mg by mouth 2 (two) times daily.      . furosemide (LASIX) 40 MG tablet Take 1 tablet (40 mg total) by mouth daily.  30 tablet  4  . HYDROcodone-acetaminophen (NORCO) 7.5-325 MG per tablet Take 1 tablet by mouth every 6 (six) hours as needed for pain.      Marland Kitchen HYDROcodone-homatropine (HYCODAN) 5-1.5 MG/5ML syrup Take 5 mLs by mouth 2 (two) times daily.  120 mL  0  . mometasone-formoterol (DULERA) 100-5 MCG/ACT AERO Inhale 2 puffs into the lungs 2 (two) times daily.  1 Inhaler  1  . Omega-3 Fatty Acids (FISH OIL) 1200 MG CAPS Take by mouth. Twice a day      . oxybutynin (DITROPAN-XL) 10 MG 24 hr tablet Take 1 tablet (10 mg total) by mouth daily.  90 tablet  3  .  potassium chloride SA (K-DUR,KLOR-CON) 20 MEQ tablet Take 1 tablet (20 mEq total) by mouth daily.  30 tablet  4  . rOPINIRole (REQUIP) 1 MG tablet Take 1 tablet (1 mg total) by mouth 2 (two) times daily.  180 tablet  3  . rosuvastatin (CRESTOR) 40 MG tablet Take 1 tablet (40 mg total) by mouth daily.  90 tablet  3  . warfarin (COUMADIN) 2.5 MG tablet Take 1-2 tablets (2.5-5 mg total) by mouth daily. Take 1 tablet (2.5 mg) once daily on Mon, Wed and Friday. Take 2 tablets (5 mg) on all other days.  30 tablet  4  . [DISCONTINUED] oxybutynin (DITROPAN-XL) 10 MG 24 hr tablet Take 10 mg by mouth daily.       No current facility-administered medications for this visit.   Facility-Administered Medications Ordered in Other Visits  Medication Dose Route Frequency Provider Last Rate Last Dose  . propofol (DIPRIVAN) 10 mg/mL bolus/IV push    Anesthesia Intra-op Scheryl Darter, CRNA   10 mg at  09/13/13 1018    Allergies  Allergen Reactions  . Ciprofloxacin   . Demerol [Meperidine]   . Sulfa Antibiotics     Past Medical History  Diagnosis Date  . Arthritis   . Persistent atrial fibrillation     failed on Flecainide and Tikosyn  . Renal calculi   . Hypertension   . Hyperlipidemia   . Rheumatic fever     age 5  . History of blood transfusion   . Incontinence of urine   . Diastolic dysfunction     preserved EF,  CHF In setting of afib previously  . Osteoporosis   . OSA (obstructive sleep apnea)     uses CPAP  . Restless legs   . Systolic heart failure     EF 30 to 35% per echo 08/2013    Past Surgical History  Procedure Laterality Date  . Breast surgery      benign biopsy  . Tonsillectomy    . Abdominal hysterectomy    . Spine surgery      laminectomy 1965, spinal fusion with rod 2005  . Replacement total knee  2005  . Kidney stone surgery      1997 removed  . Cardioversion N/A 09/10/2013    Procedure: CARDIOVERSION;  Surgeon: Thompson Grayer, MD;  Location: [facility];  Service: Cardiovascular;  Laterality: N/A;    History  Smoking status  . Never Smoker   Smokeless tobacco  . Not on file    History  Alcohol Use  . Yes    Comment: glass of wine each day    Family History  Problem Relation Age of Onset  . Cancer Mother     breast  . Diabetes Mother   . Cancer Paternal Grandfather     Review of Systems: The review of systems is per the HPI.  All other systems were reviewed and are negative.  Physical Exam: BP 140/80  Pulse 93  Ht 5\' 1"  (1.549 m)  Wt 186 lb 6.4 oz (84.55 kg)  BMI 35.24 kg/m2 Patient is very pleasant and in no acute distress. Weight is up. Skin is warm and dry. Color is normal.  HEENT is unremarkable. Normocephalic/atraumatic. PERRL. Sclera are nonicteric. Neck is supple. No masses. No JVD. Lungs are clear. Cardiac exam shows an irregular rhythm. Rate is fairly controlled.  Abdomen is soft. Extremities are with 1+edema. Gait  and ROM are intact. Using a cane. No gross neurologic deficits noted.  Wt Readings from Last 3 Encounters:  10/08/13 186 lb 6.4 oz (84.55 kg)  10/03/13 187 lb (84.823 kg)  09/14/13 177 lb 11.1 oz (80.6 kg)     LABORATORY DATA: EKG today shows atrial fib - fair rate control with a rate of 93  Lab Results  Component Value Date   WBC 3.8* 09/09/2013   HGB 11.2* 09/09/2013   HCT 33.4* 09/09/2013   PLT 140* 09/09/2013   GLUCOSE 88 09/14/2013   CHOL 169 12/20/2011   TRIG 108.0 12/20/2011   HDL 78.80 12/20/2011   LDLCALC 69 12/20/2011   ALT 18 12/20/2011   AST 26 12/20/2011   NA 141 09/14/2013   K 4.4 09/14/2013   CL 108 09/14/2013   CREATININE 0.82 09/14/2013   BUN 20 09/14/2013   CO2 24 09/14/2013   TSH 1.875 09/08/2013   INR 1.5 10/04/2013   Echo Study Conclusions from December 2014  - Left ventricle: The cavity size was normal. Wall thickness was normal. Systolic function was moderately to severely reduced. The estimated ejection fraction was in the range of 30% to 35%. Diffuse hypokinesis. - Left atrium: The atrium was moderately dilated. - Right atrium: The atrium was moderately dilated. - Pulmonary arteries: PA peak pressure: 34mm Hg (S).  CHEST CT IMPRESSION: 1. No definite findings to suggest an underlying interstitial lung disease at this time. 2. There is a small area in the right upper lobe which likely represents a region of post infectious/inflammatory fibrosis, described above. This is most likely to account for the perceived nodular opacity in the right upper lobe on the recent chest x-ray 09/12/2013. 3. In addition, there is a more peripheral 11 mm sub solid nodule in the right upper lobe which is unlikely to be visualized on chest radiographs. This will require CT imaging followup. Initial follow-up by chest CT without contrast is recommended in 3 months to confirm persistence. This recommendation follows the consensus statement: Recommendations for the  Management of Subsolid Pulmonary Nodules Detected at CT: A Statement from the Fleischner Society as published in Radiology 2013; 266:304-317. 4. Atherosclerosis, including left main and 3 vessel coronary artery disease.Assessment for potential risk factor modification, dietary therapy or pharmacologic therapy may be warranted, if clinically indicated. 5. Mild cardiomegaly.   Electronically Signed By: Vinnie Langton M.D. On: 09/14/2013 15:28    Assessment / Plan: 1. AF - looks persistent - discussed with Dr. Rayann Heman - will start amiodarone - will need close follow up of her PFT's. Has already had her TSH checked.   2. Systolic HF - weight is up - using too much salt - cut her lasix back - I am increasing the Lasix to BID for 3 days and then back to a full tablet daily. Aldactone is added. Use NTG sl prn. Could consider Imdur in the future. Needs to really restrict the salt and continue with daily weights.  3. Chronic anticoagulation - followed by PCP - will need to get rechecked later this week and more closely due to being on the amiodarone  We will check labs today. Will get a release signed for her records from her previous cardiologist. I will see her back next week.   Patient is agreeable to this plan and will call if any problems develop in the interim.   Burtis Junes, RN, [facility] [address] [place],   [address] [phone]

## 2013-10-08 NOTE — Patient Instructions (Addendum)
Stay on your current medicines but I am adding Aldactone 25 mg daily, increasing Lasix to 40 mg two times a day for 3 days only and then back to once daily and adding amiodarone 200 mg two times a day.  Use your NTG under your tongue for recurrent chest pain/short of breath. May take one tablet every 5 minutes. If you are still having discomfort after 3 tablets in 15 minutes, call 911.  Go and get your Coumadin checked by the end of this week since we are adding back amiodarone - this can make your coumadin work better (and thus need less)  See me next week with an EKG  Really avoid salt  Weigh every day and keep a diary  Let's sign a release for your records from your cardiologist from [place]  Call the [place] office at [phone] if you have any questions, problems or concerns.

## 2013-10-11 ENCOUNTER — Ambulatory Visit (INDEPENDENT_AMBULATORY_CARE_PROVIDER_SITE_OTHER): Payer: Medicare Other | Admitting: General Practice

## 2013-10-11 DIAGNOSIS — I4891 Unspecified atrial fibrillation: Secondary | ICD-10-CM

## 2013-10-11 LAB — POCT INR: INR: 2.6

## 2013-10-11 NOTE — Progress Notes (Signed)
Pre-visit discussion using our clinic review tool. No additional management support is needed unless otherwise documented below in the visit note.  

## 2013-10-17 ENCOUNTER — Ambulatory Visit (INDEPENDENT_AMBULATORY_CARE_PROVIDER_SITE_OTHER): Payer: Medicare Other | Admitting: Nurse Practitioner

## 2013-10-17 ENCOUNTER — Encounter: Payer: Self-pay | Admitting: Nurse Practitioner

## 2013-10-17 VITALS — BP 110/60 | HR 84 | Ht 61.0 in | Wt 178.0 lb

## 2013-10-17 DIAGNOSIS — I4891 Unspecified atrial fibrillation: Secondary | ICD-10-CM | POA: Diagnosis not present

## 2013-10-17 DIAGNOSIS — S42209A Unspecified fracture of upper end of unspecified humerus, initial encounter for closed fracture: Secondary | ICD-10-CM | POA: Diagnosis not present

## 2013-10-17 NOTE — Patient Instructions (Signed)
We are going to arrange for a stress test (lexiscan)  Stay on your current medicines  Check lab today  Weekly coumadin checks - goal to be above 2 weekly for the next 3 weeks  I will see you in 3 weeks - will hope to set your cardioversion up at that visit  Call the [place] office at [phone] if you have any questions, problems or concerns.

## 2013-10-17 NOTE — Progress Notes (Signed)
[place] Date of Birth: [birth year] Medical Record #[id number]  History of Present Illness: Ms. Angel Meyer is seen back today for a 10 day visit. Seen for Dr. Johney Frame. She is a 80 year old female with a history of AF, rheumatic fever at age 34, HTN and OSA.   Most recently presented with progressive dyspnea, orthopnea and cough for 6 to 8 weeks - noted to be in AF with RVR and volume overload. She has been treated with flecainide which was unsuccessful. Then placed on Tikosyn and was cardioverted - this also unsuccessful. Now managed with rate control and anticoagulation. Seen by PCCM for her cough/congestion and was to discuss possible amiodarone therapy. Echo shows her EF down to 30 to 35%.   Seen about 2 weeks ago by Rikki Spearing, NP - "No ILD /fibrosis on CT scan -ok for Amio" noted in her note.   Seen 10 days ago - Having good days and bad days. Mostly feeling bad at night. Had more PND/orthopnea - had to go and sit up to breathe better. Some chest pressure noted. Did not feel her heart racing/skipping, etc. Had cut her Lasix back to urinary frequency and no control - but this was an issue even before taking Lasix. Probably not limiting her salt. Daughter felt like she was on amiodarone in the past - for some reason stopped - they are both not sure if she has been in atrial fib the whole time since she moved here. Cough had improved.  Weight was up at home. We increased her Lasix, added aldactone and amiodarone as well. Signed release to try and get records.   Comes in today. Here with her daughter. Using a cane. Moves slow. Very tired.   Current Outpatient Prescriptions  Medication Sig Dispense Refill  . acetaminophen (TYLENOL) 650 MG CR tablet Take 650 mg by mouth every 8 (eight) hours as needed for pain.       Marland Kitchen amiodarone (PACERONE) 200 MG tablet Take 1 tablet (200 mg total) by mouth 2 (two) times daily.  60 tablet  3  . carvedilol (COREG) 25 MG tablet Take 1 tablet (25 mg total) by mouth 2  (two) times daily with a meal.  60 tablet  4  . Cholecalciferol (VITAMIN D3) 2000 UNITS TABS Take by mouth. Once a day      . diltiazem (CARTIA XT) 180 MG 24 hr capsule Take 180 mg by mouth daily.      Marland Kitchen docusate sodium (COLACE) 100 MG capsule Take 100 mg by mouth daily as needed for mild constipation.      Marland Kitchen escitalopram (LEXAPRO) 10 MG tablet Take 1 tablet (10 mg total) by mouth daily.  90 tablet  3  . furosemide (LASIX) 40 MG tablet Take 1 tablet (40 mg total) by mouth daily.  30 tablet  4  . HYDROcodone-homatropine (HYCODAN) 5-1.5 MG/5ML syrup Take 5 mLs by mouth 2 (two) times daily.  120 mL  0  . mometasone-formoterol (DULERA) 100-5 MCG/ACT AERO Inhale 2 puffs into the lungs 2 (two) times daily.  1 Inhaler  1  . nitroGLYCERIN (NITROSTAT) 0.4 MG SL tablet Place 1 tablet (0.4 mg total) under the tongue every 5 (five) minutes as needed for chest pain.  25 tablet  3  . Omega-3 Fatty Acids (FISH OIL) 1200 MG CAPS Take by mouth. Twice a day      . oxybutynin (DITROPAN-XL) 10 MG 24 hr tablet Take 1 tablet (10 mg total) by mouth daily.  90 tablet  3  . potassium chloride SA (K-DUR,KLOR-CON) 20 MEQ tablet Take 1 tablet (20 mEq total) by mouth daily.  30 tablet  4  . rOPINIRole (REQUIP) 1 MG tablet Take 1 tablet (1 mg total) by mouth 2 (two) times daily.  180 tablet  3  . rosuvastatin (CRESTOR) 40 MG tablet Take 1 tablet (40 mg total) by mouth daily.  90 tablet  3  . spironolactone (ALDACTONE) 25 MG tablet Take 1 tablet (25 mg total) by mouth daily.  30 tablet  3  . warfarin (COUMADIN) 2.5 MG tablet Take 1-2 tablets (2.5-5 mg total) by mouth daily. Take 1 tablet (2.5 mg) once daily on Mon, Wed and Friday. Take 2 tablets (5 mg) on all other days.  30 tablet  4  . [DISCONTINUED] oxybutynin (DITROPAN-XL) 10 MG 24 hr tablet Take 10 mg by mouth daily.       No current facility-administered medications for this visit.   Facility-Administered Medications Ordered in Other Visits  Medication Dose Route  Frequency Provider Last Rate Last Dose  . propofol (DIPRIVAN) 10 mg/mL bolus/IV push    Anesthesia Intra-op Scheryl Darter, CRNA   10 mg at 09/13/13 1018    Allergies  Allergen Reactions  . Ciprofloxacin   . Demerol [Meperidine]   . Sulfa Antibiotics     Past Medical History  Diagnosis Date  . Arthritis   . Persistent atrial fibrillation     failed on Flecainide and Tikosyn  . Renal calculi   . Hypertension   . Hyperlipidemia   . Rheumatic fever     age 6  . History of blood transfusion   . Incontinence of urine   . Diastolic dysfunction     preserved EF,  CHF In setting of afib previously  . Osteoporosis   . OSA (obstructive sleep apnea)     uses CPAP  . Restless legs   . Systolic heart failure     EF 30 to 35% per echo 08/2013    Past Surgical History  Procedure Laterality Date  . Breast surgery      benign biopsy  . Tonsillectomy    . Abdominal hysterectomy    . Spine surgery      laminectomy 1965, spinal fusion with rod 2005  . Replacement total knee  2005  . Kidney stone surgery      1997 removed  . Cardioversion N/A 09/10/2013    Procedure: CARDIOVERSION;  Surgeon: Thompson Grayer, MD;  Location: [facility] OR;  Service: Cardiovascular;  Laterality: N/A;    History  Smoking status  . Never Smoker   Smokeless tobacco  . Not on file    History  Alcohol Use  . Yes    Comment: glass of wine each day    Family History  Problem Relation Age of Onset  . Cancer Mother     breast  . Diabetes Mother   . Cancer Paternal Grandfather     Review of Systems: The review of systems is per the HPI.  All other systems were reviewed and are negative.  Physical Exam: Pulse 84  Ht 5\' 1"  (1.549 m)  Wt 178 lb (80.74 kg)  BMI 33.65 kg/m2  SpO2 96% BP is 110/60 in the left and 100/60 in the right Patient is very pleasant and in no acute distress. Down 8 pounds. Skin is warm and dry. Color is normal.  HEENT is unremarkable. Normocephalic/atraumatic. PERRL. Sclera are  nonicteric. Neck is supple. No masses.  No JVD. Lungs are clear. Cardiac exam shows an irregular rhythm. Rate is controlled. Abdomen is soft. Extremities are without edema. Gait and ROM are intact. No gross neurologic deficits noted.  Wt Readings from Last 3 Encounters:  10/17/13 178 lb (80.74 kg)  10/08/13 186 lb 6.4 oz (84.55 kg)  10/03/13 187 lb (84.823 kg)     LABORATORY DATA: EKG today shows atrial fib - rate is controlled at 78 today.    Lab Results  Component Value Date   WBC 5.4 10/08/2013   HGB 12.3 10/08/2013   HCT 37.0 10/08/2013   PLT 122.0* 10/08/2013   GLUCOSE 85 10/08/2013   CHOL 169 12/20/2011   TRIG 108.0 12/20/2011   HDL 78.80 12/20/2011   LDLCALC 69 12/20/2011   ALT 18 12/20/2011   AST 26 12/20/2011   NA 142 10/08/2013   K 3.9 10/08/2013   CL 108 10/08/2013   CREATININE 0.9 10/08/2013   BUN 21 10/08/2013   CO2 26 10/08/2013   TSH 1.875 09/08/2013   INR 2.6 10/11/2013   Echo Study Conclusions from December 2014  - Left ventricle: The cavity size was normal. Wall thickness was normal. Systolic function was moderately to severely reduced. The estimated ejection fraction was in the range of 30% to 35%. Diffuse hypokinesis. - Left atrium: The atrium was moderately dilated. - Right atrium: The atrium was moderately dilated. - Pulmonary arteries: PA peak pressure: 70mm Hg (S).  CHEST CT IMPRESSION: 1. No definite findings to suggest an underlying interstitial lung disease at this time. 2. There is a small area in the right upper lobe which likely represents a region of post infectious/inflammatory fibrosis, described above. This is most likely to account for the perceived nodular opacity in the right upper lobe on the recent chest x-ray 09/12/2013. 3. In addition, there is a more peripheral 11 mm sub solid nodule in the right upper lobe which is unlikely to be visualized on chest radiographs. This will require CT imaging followup. Initial follow-up by chest CT without  contrast is recommended in 3 months to confirm persistence. This recommendation follows the consensus statement: Recommendations for the Management of Subsolid Pulmonary Nodules Detected at CT: A Statement from the Fleischner Society as published in Radiology 2013; 266:304-317. 4. Atherosclerosis, including left main and 3 vessel coronary artery disease.Assessment for potential risk factor modification, dietary therapy or pharmacologic therapy may be warranted, if clinically indicated. 5. Mild cardiomegaly.   Electronically Signed By: Vinnie Langton M.D. On: 09/14/2013 15:28  Assessment / Plan:  1. AF -will need close follow up of her PFTs, rate is controlled - coumadin has only been therapeutic since January 15th - was down to 1.5 on January 8th. Will need weekly checks x 3 more. Hope to schedule cardioversion on return.   2. Systolic HF - weight is coming down. She is restricting her salt. Continue with daily weights - to use extra Lasix prn weight gain of 2 to 3 pounds overnight.   3. Chronic anticoagulation - followed by PCP - will need weekly checks  I was able to review some of her records from [place]. She had a normal stress test in 2012 with a normal EF - and basically normal echo then as well. Will arrange for Lexiscan to rule out CAD as the etiology for her LV dysfunction. She is limited by back pain and will not be able to walk adequately.   Patient is agreeable to this plan and will call if any problems develop in  the interim.   Burtis Junes, RN, [place]  [address] [place]  [place], [place] [address]  [phone]

## 2013-10-18 ENCOUNTER — Ambulatory Visit: Payer: Medicare Other

## 2013-10-18 ENCOUNTER — Ambulatory Visit (INDEPENDENT_AMBULATORY_CARE_PROVIDER_SITE_OTHER): Payer: Medicare Other | Admitting: General Practice

## 2013-10-18 ENCOUNTER — Telehealth: Payer: Self-pay | Admitting: Nurse Practitioner

## 2013-10-18 DIAGNOSIS — Z7189 Other specified counseling: Secondary | ICD-10-CM | POA: Insufficient documentation

## 2013-10-18 DIAGNOSIS — I4891 Unspecified atrial fibrillation: Secondary | ICD-10-CM | POA: Diagnosis not present

## 2013-10-18 DIAGNOSIS — Z5181 Encounter for therapeutic drug level monitoring: Secondary | ICD-10-CM

## 2013-10-18 LAB — BASIC METABOLIC PANEL WITH GFR
BUN: 23 mg/dL (ref 6–23)
CO2: 25 meq/L (ref 19–32)
Calcium: 9.5 mg/dL (ref 8.4–10.5)
Chloride: 104 meq/L (ref 96–112)
Creatinine, Ser: 1 mg/dL (ref 0.4–1.2)
GFR: 58.28 mL/min — ABNORMAL LOW
Glucose, Bld: 87 mg/dL (ref 70–99)
Potassium: 4.4 meq/L (ref 3.5–5.1)
Sodium: 138 meq/L (ref 135–145)

## 2013-10-18 LAB — POCT INR: INR: 4.5

## 2013-10-18 NOTE — Progress Notes (Signed)
Pre-visit discussion using our clinic review tool. No additional management support is needed unless otherwise documented below in the visit note.  

## 2013-10-18 NOTE — Progress Notes (Signed)
Quick Note:  Preliminary report reviewed by triage nurse and sent to MD desk. ______ 

## 2013-10-18 NOTE — Telephone Encounter (Signed)
ROI faxed to [facility] at [phone] call back [phone]

## 2013-10-19 ENCOUNTER — Encounter: Payer: Self-pay | Admitting: General Surgery

## 2013-10-23 ENCOUNTER — Ambulatory Visit (INDEPENDENT_AMBULATORY_CARE_PROVIDER_SITE_OTHER): Payer: Medicare Other | Admitting: Family Medicine

## 2013-10-23 ENCOUNTER — Encounter: Payer: Self-pay | Admitting: Family Medicine

## 2013-10-23 ENCOUNTER — Other Ambulatory Visit: Payer: Self-pay | Admitting: General Practice

## 2013-10-23 VITALS — BP 122/72 | HR 78 | Temp 97.8°F | Wt 180.0 lb

## 2013-10-23 DIAGNOSIS — H5789 Other specified disorders of eye and adnexa: Secondary | ICD-10-CM

## 2013-10-23 DIAGNOSIS — H579 Unspecified disorder of eye and adnexa: Secondary | ICD-10-CM

## 2013-10-23 MED ORDER — WARFARIN SODIUM 2.5 MG PO TABS: ORAL_TABLET | ORAL | Status: DC

## 2013-10-23 MED ORDER — TOBRAMYCIN-DEXAMETHASONE 0.3-0.1 % OP SUSP: 2.0000 [drp] | Freq: Four times a day (QID) | OPHTHALMIC | Status: DC

## 2013-10-23 NOTE — Patient Instructions (Signed)
Followup promptly for any eye pain or blurred vision. Let me know if symptoms are not resolving over the next 2-3 days

## 2013-10-23 NOTE — Progress Notes (Signed)
   Subjective:    Patient ID: Angel Meyer, female    DOB: [birth year], 80 y.o.   MRN: [id number]  HPI Right eye irritation. Started a few days ago. No foreign body. No injury. Minimal redness. No drainage. Tried Visine eye drops and saline eyedrops without much improvement. No contact use. No left eye symptoms. No headaches.  Past Medical History  Diagnosis Date  . Arthritis   . Persistent atrial fibrillation     failed on Flecainide and Tikosyn  . Renal calculi   . Hypertension   . Hyperlipidemia   . Rheumatic fever     age 19  . History of blood transfusion   . Incontinence of urine   . Diastolic dysfunction     preserved EF,  CHF In setting of afib previously  . Osteoporosis   . OSA (obstructive sleep apnea)     uses CPAP  . Restless legs   . Systolic heart failure     EF 30 to 35% per echo 08/2013   Past Surgical History  Procedure Laterality Date  . Breast surgery      benign biopsy  . Tonsillectomy    . Abdominal hysterectomy    . Spine surgery      laminectomy 1965, spinal fusion with rod 2005  . Replacement total knee  2005  . Kidney stone surgery      1997 removed  . Cardioversion N/A 09/10/2013    Procedure: CARDIOVERSION;  Surgeon: Thompson Grayer, MD;  Location[facility] OR;  Service: Cardiovascular;  Laterality: N/A;    reports that she has never smoked. She does not have any smokeless tobacco history on file. She reports that she drinks alcohol. She reports that she does not use illicit drugs. family history includes Cancer in her mother and paternal grandfather; Diabetes in her mother. Allergies  Allergen Reactions  . Ciprofloxacin   . Demerol [Meperidine]   . Sulfa Antibiotics       Review of Systems  Constitutional: Negative for fever and chills.  Eyes: Positive for redness and itching. Negative for photophobia, pain and visual disturbance.       Objective:   Physical Exam  Constitutional: She appears well-developed and well-nourished.  HENT:    Right Ear: External ear normal.  Left Ear: External ear normal.  Eyes:  Patient has some minimal hyper- injection right eye-sclera. Conjunctiva appears normal. Cornea appears normal. Fluoroscein applied. She has no corneal abrasions or any form body or ulceration.  Cardiovascular: Normal rate.           Assessment & Plan:  Right eye irritation. Suspect allergic. She does not have evidence for infection or foreign body or ulceration as above. TobraDex drops 1-2 drops right eye 4 times a day as needed and touch base 2-3 days if not resolving

## 2013-10-23 NOTE — Progress Notes (Signed)
Pre visit review using our clinic review tool, if applicable. No additional management support is needed unless otherwise documented below in the visit note. 

## 2013-10-25 ENCOUNTER — Ambulatory Visit (INDEPENDENT_AMBULATORY_CARE_PROVIDER_SITE_OTHER): Payer: Medicare Other | Admitting: General Practice

## 2013-10-25 DIAGNOSIS — I4891 Unspecified atrial fibrillation: Secondary | ICD-10-CM

## 2013-10-25 DIAGNOSIS — M6281 Muscle weakness (generalized): Secondary | ICD-10-CM | POA: Diagnosis not present

## 2013-10-25 DIAGNOSIS — M25519 Pain in unspecified shoulder: Secondary | ICD-10-CM | POA: Diagnosis not present

## 2013-10-25 LAB — POCT INR: INR: 2.7

## 2013-10-25 NOTE — Progress Notes (Signed)
Pre-visit discussion using our clinic review tool. No additional management support is needed unless otherwise documented below in the visit note.  

## 2013-10-30 DIAGNOSIS — M6281 Muscle weakness (generalized): Secondary | ICD-10-CM | POA: Diagnosis not present

## 2013-10-30 DIAGNOSIS — M25519 Pain in unspecified shoulder: Secondary | ICD-10-CM | POA: Diagnosis not present

## 2013-10-31 ENCOUNTER — Ambulatory Visit (HOSPITAL_COMMUNITY): Payer: Medicare Other | Attending: Nurse Practitioner | Admitting: Radiology

## 2013-10-31 ENCOUNTER — Encounter: Payer: Self-pay | Admitting: Cardiology

## 2013-10-31 VITALS — BP 103/84 | Ht 61.0 in | Wt 172.0 lb

## 2013-10-31 DIAGNOSIS — J449 Chronic obstructive pulmonary disease, unspecified: Secondary | ICD-10-CM | POA: Diagnosis not present

## 2013-10-31 DIAGNOSIS — R0609 Other forms of dyspnea: Secondary | ICD-10-CM | POA: Insufficient documentation

## 2013-10-31 DIAGNOSIS — E785 Hyperlipidemia, unspecified: Secondary | ICD-10-CM | POA: Diagnosis not present

## 2013-10-31 DIAGNOSIS — R0989 Other specified symptoms and signs involving the circulatory and respiratory systems: Secondary | ICD-10-CM | POA: Diagnosis not present

## 2013-10-31 DIAGNOSIS — I10 Essential (primary) hypertension: Secondary | ICD-10-CM | POA: Diagnosis not present

## 2013-10-31 DIAGNOSIS — R079 Chest pain, unspecified: Secondary | ICD-10-CM | POA: Diagnosis not present

## 2013-10-31 DIAGNOSIS — R0789 Other chest pain: Secondary | ICD-10-CM

## 2013-10-31 DIAGNOSIS — I4891 Unspecified atrial fibrillation: Secondary | ICD-10-CM | POA: Diagnosis not present

## 2013-10-31 DIAGNOSIS — R0602 Shortness of breath: Secondary | ICD-10-CM | POA: Diagnosis not present

## 2013-10-31 DIAGNOSIS — I509 Heart failure, unspecified: Secondary | ICD-10-CM | POA: Insufficient documentation

## 2013-10-31 DIAGNOSIS — J4489 Other specified chronic obstructive pulmonary disease: Secondary | ICD-10-CM | POA: Insufficient documentation

## 2013-10-31 DIAGNOSIS — R110 Nausea: Secondary | ICD-10-CM

## 2013-10-31 DIAGNOSIS — R42 Dizziness and giddiness: Secondary | ICD-10-CM

## 2013-10-31 MED ORDER — TECHNETIUM TC 99M SESTAMIBI GENERIC - CARDIOLITE
33.0000 | Freq: Once | INTRAVENOUS | Status: AC | PRN
  Administered 2013-10-31: 33 via INTRAVENOUS

## 2013-10-31 MED ORDER — TECHNETIUM TC 99M SESTAMIBI GENERIC - CARDIOLITE
10.8000 | Freq: Once | INTRAVENOUS | Status: AC | PRN
  Administered 2013-10-31: 11 via INTRAVENOUS

## 2013-10-31 MED ORDER — AMINOPHYLLINE 25 MG/ML IV SOLN
75.0000 mg | Freq: Two times a day (BID) | INTRAVENOUS | Status: DC | PRN
  Administered 2013-10-31: 75 mg via INTRAVENOUS

## 2013-10-31 MED ORDER — REGADENOSON 0.4 MG/5ML IV SOLN
0.4000 mg | Freq: Once | INTRAVENOUS | Status: AC
  Administered 2013-10-31: 0.4 mg via INTRAVENOUS

## 2013-10-31 NOTE — Progress Notes (Signed)
[facility] NUCLEAR [facility] [address] [place],  [address] [phone]    Cardiology Nuclear Med Study  [name] is a 80 y.o. female     MRN : [id number]     DOB: Jan 01, 1933  Procedure Date: 10/31/2013  Nuclear Med Background Indication for Stress Test:  Evaluation for Ischemia and Pending repeat Cardioversion by Dr. Thompson Grayer History:  COPD and 2012 ([place]) MPI: NL EF: 58% NL 08/2013 AFIB/CHF Cardioversion, ECHO: EF: 30-35% 09/14/13 Chest CT: Atherosclererosis LM and 3V Dz Cardiac Risk Factors: Hypertension and Lipids  Symptoms:  Chest Pain and DOE   Nuclear Pre-Procedure Caffeine/Decaff Intake:  None > 12 hrs NPO After: 6:00pm   Lungs:  clear O2 Sat: 97% on room air. IV 0.9% NS with Angio Cath:  24g  IV Site: R Hand, tolerated well IV Started by:  Irven Baltimore, RN  Chest Size (in):  36 Cup Size: B  Height: 5\' 1"  (1.549 m)  Weight:  172 lb (78.019 kg)  BMI:  Body mass index is 32.52 kg/(m^2). Tech Comments:  Took Coreg this am    Nuclear Med Study 1 or 2 day study: 1 day  Stress Test Type:  Lexiscan  Reading MD: N/A  Order Authorizing Provider:  Thompson Grayer, MD  Resting Radionuclide: Technetium 71m Sestamibi  Resting Radionuclide Dose: 11.0 mCi   Stress Radionuclide:  Technetium 4m Sestamibi  Stress Radionuclide Dose: 33.0 mCi           Stress Protocol Rest HR: 71 Stress HR: 82  Rest BP: 103/84 Stress BP: 100/82  Exercise Time (min): n/a METS: n/a   Predicted Max HR: 142 bpm % Max HR: 57.75 bpm Rate Pressure Product: 8446   Dose of Adenosine (mg):  n/a Dose of Lexiscan: 0.4 mg  Dose of Atropine (mg): n/a Dose of Dobutamine: n/a mcg/kg/min (at max HR)  Stress Test Technologist: Perrin Maltese, EMT-P  Nuclear Technologist:  Vedia Pereyra, CNMT     Rest Procedure:  Myocardial perfusion imaging was performed at rest 45 minutes following the intravenous administration of Technetium 69m Sestamibi. Rest ECG: Atrial fibrillation,  anterior MI.  Stress Procedure:  The patient received IV Lexiscan 0.4 mg over 15-seconds.  Technetium 35m Sestamibi injected at 30-seconds. This patient had chest heaviness, nausea, and sob with the Lexiscan injection. Quantitative spect images were obtained after a 45 minute delay. Stress ECG: No significant ST segment change suggestive of ischemia.  QPS Raw Data Images:  Acquisition technically good; normal left ventricular size. Stress Images:  Normal homogeneous uptake in all areas of the myocardium. Rest Images:  Normal homogeneous uptake in all areas of the myocardium. Subtraction (SDS):  No evidence of ischemia. Transient Ischemic Dilatation (Normal <1.22):  1.05 Lung/Heart Ratio (Normal <0.45):  0.35  Quantitative Gated Spect Images QGS EDV:  NA QGS ESV:  NA  Impression Exercise Capacity:  Lexiscan with no exercise. BP Response:  Hypotensive blood pressure response. Clinical Symptoms:  There is chest heaviness. ECG Impression:  No significant ST segment change suggestive of ischemia. Comparison with Prior Nuclear Study: No images to compare  Overall Impression:  Normal stress nuclear study.  LV Ejection Fraction: Study not gated.  LV Wall Motion:  NA  Angel Meyer

## 2013-11-01 ENCOUNTER — Ambulatory Visit (INDEPENDENT_AMBULATORY_CARE_PROVIDER_SITE_OTHER): Payer: Medicare Other | Admitting: Family Medicine

## 2013-11-01 ENCOUNTER — Ambulatory Visit (INDEPENDENT_AMBULATORY_CARE_PROVIDER_SITE_OTHER): Payer: Medicare Other | Admitting: General Practice

## 2013-11-01 ENCOUNTER — Ambulatory Visit: Payer: Medicare Other

## 2013-11-01 ENCOUNTER — Encounter: Payer: Self-pay | Admitting: Family Medicine

## 2013-11-01 VITALS — BP 120/78 | HR 61 | Temp 97.3°F | Wt 177.0 lb

## 2013-11-01 DIAGNOSIS — H5789 Other specified disorders of eye and adnexa: Secondary | ICD-10-CM

## 2013-11-01 DIAGNOSIS — I4891 Unspecified atrial fibrillation: Secondary | ICD-10-CM

## 2013-11-01 DIAGNOSIS — H579 Unspecified disorder of eye and adnexa: Secondary | ICD-10-CM | POA: Diagnosis not present

## 2013-11-01 DIAGNOSIS — M25519 Pain in unspecified shoulder: Secondary | ICD-10-CM | POA: Diagnosis not present

## 2013-11-01 DIAGNOSIS — M6281 Muscle weakness (generalized): Secondary | ICD-10-CM | POA: Diagnosis not present

## 2013-11-01 LAB — POCT INR: INR: 3.3

## 2013-11-01 NOTE — Progress Notes (Signed)
Pre-visit discussion using our clinic review tool. No additional management support is needed unless otherwise documented below in the visit note.  

## 2013-11-01 NOTE — Progress Notes (Signed)
   Subjective:    Patient ID: Angel Meyer, female    DOB: 08-27-1933, 80 y.o.   MRN: [id number]  Eye Problem  Associated symptoms include eye redness. Pertinent negatives include no fever or photophobia.   Patient here with persistent irritation right eye Refer to prior note from 10/23/2013. She denies any injury or foreign body. No rash. Minimal drainage which has been clear. She initially tried Visine. We performed fluoroscein stain with no evidence for foreign body or abrasions or ulcerations. She denies any eye pain. She has some mild itching. No matting or purulent drainage. She started TobraDex drops which did not seem to help much.  Past Medical History  Diagnosis Date  . Arthritis   . Persistent atrial fibrillation     failed on Flecainide and Tikosyn  . Renal calculi   . Hypertension   . Hyperlipidemia   . Rheumatic fever     age 47  . History of blood transfusion   . Incontinence of urine   . Diastolic dysfunction     preserved EF,  CHF In setting of afib previously  . Osteoporosis   . OSA (obstructive sleep apnea)     uses CPAP  . Restless legs   . Systolic heart failure     EF 30 to 35% per echo 08/2013   Past Surgical History  Procedure Laterality Date  . Breast surgery      benign biopsy  . Tonsillectomy    . Abdominal hysterectomy    . Spine surgery      laminectomy 1965, spinal fusion with rod 2005  . Replacement total knee  2005  . Kidney stone surgery      1997 removed  . Cardioversion N/A 09/10/2013    Procedure: CARDIOVERSION;  Surgeon: Thompson Grayer, MD;  Location: [facility] OR;  Service: Cardiovascular;  Laterality: N/A;    reports that she has never smoked. She does not have any smokeless tobacco history on file. She reports that she drinks alcohol. She reports that she does not use illicit drugs. family history includes Cancer in her mother and paternal grandfather; Diabetes in her mother. Allergies  Allergen Reactions  . Ciprofloxacin   . Demerol  [Meperidine]   . Sulfa Antibiotics       Review of Systems  Constitutional: Negative for fever and chills.  Eyes: Positive for redness and itching. Negative for photophobia, pain and visual disturbance.       Objective:   Physical Exam  Constitutional: She appears well-developed and well-nourished.  Eyes:  Right eye reveals very mild conjunctival erythema. She has some mildly inflamed episcleral vessels. Cornea appears normal. No visible foreign body. Small cataract right eye  Cardiovascular: Normal rate.           Assessment & Plan:  Persistent irritation right eye. Differential is allergic versus viral conjunctivitis. She has not responded TobraDex eyedrops. Set up ophthalmology referral. She does not have any red flags such as visual change or eye pain

## 2013-11-01 NOTE — Progress Notes (Signed)
Pre visit review using our clinic review tool, if applicable. No additional management support is needed unless otherwise documented below in the visit note. 

## 2013-11-02 DIAGNOSIS — H40019 Open angle with borderline findings, low risk, unspecified eye: Secondary | ICD-10-CM | POA: Diagnosis not present

## 2013-11-02 DIAGNOSIS — H02059 Trichiasis without entropian unspecified eye, unspecified eyelid: Secondary | ICD-10-CM | POA: Diagnosis not present

## 2013-11-02 DIAGNOSIS — S058X9A Other injuries of unspecified eye and orbit, initial encounter: Secondary | ICD-10-CM | POA: Diagnosis not present

## 2013-11-05 ENCOUNTER — Ambulatory Visit (INDEPENDENT_AMBULATORY_CARE_PROVIDER_SITE_OTHER): Payer: Medicare Other | Admitting: Nurse Practitioner

## 2013-11-05 ENCOUNTER — Encounter: Payer: Self-pay | Admitting: Nurse Practitioner

## 2013-11-05 VITALS — HR 85 | Ht 61.0 in | Wt 175.8 lb

## 2013-11-05 DIAGNOSIS — Z79899 Other long term (current) drug therapy: Secondary | ICD-10-CM | POA: Diagnosis not present

## 2013-11-05 DIAGNOSIS — I4891 Unspecified atrial fibrillation: Secondary | ICD-10-CM | POA: Diagnosis not present

## 2013-11-05 DIAGNOSIS — S058X9A Other injuries of unspecified eye and orbit, initial encounter: Secondary | ICD-10-CM | POA: Diagnosis not present

## 2013-11-05 LAB — CBC
HCT: 47.3 % — ABNORMAL HIGH (ref 36.0–46.0)
Hemoglobin: 15.4 g/dL — ABNORMAL HIGH (ref 12.0–15.0)
MCHC: 32.5 g/dL (ref 30.0–36.0)
MCV: 92.9 fl (ref 78.0–100.0)
Platelets: 148 10*3/uL — ABNORMAL LOW (ref 150.0–400.0)
RBC: 5.09 Mil/uL (ref 3.87–5.11)
RDW: 16.5 % — ABNORMAL HIGH (ref 11.5–14.6)
WBC: 6 10*3/uL (ref 4.5–10.5)

## 2013-11-05 LAB — BASIC METABOLIC PANEL
BUN: 26 mg/dL — ABNORMAL HIGH (ref 6–23)
CO2: 23 mEq/L (ref 19–32)
Calcium: 9.7 mg/dL (ref 8.4–10.5)
Chloride: 103 mEq/L (ref 96–112)
Creatinine, Ser: 1.4 mg/dL — ABNORMAL HIGH (ref 0.4–1.2)
GFR: 38.93 mL/min — ABNORMAL LOW (ref >60.00)
Glucose, Bld: 114 mg/dL — ABNORMAL HIGH (ref 70–99)
Potassium: 4 mEq/L (ref 3.5–5.1)
Sodium: 138 mEq/L (ref 135–145)

## 2013-11-05 LAB — APTT: aPTT: 34.6 s — ABNORMAL HIGH (ref 21.7–28.8)

## 2013-11-05 LAB — PROTIME-INR
INR: 3.9 ratio — ABNORMAL HIGH (ref 0.8–1.0)
Prothrombin Time: 39.8 s — ABNORMAL HIGH (ref 10.2–12.4)

## 2013-11-05 NOTE — Patient Instructions (Addendum)
Stay on your current medicines  We need to check labs today  Continue with your scheduled coumadin checks  You are scheduled for a cardioversion on Friday, February 20th at 12 noon with Dr. Aundra Dubin or associates. Please go to [facility] 2nd [place] Stay at Friday, February 20th by 10 am.  Enter through the [facility] not have any food or drink after midnight on Thursday.  You may take your medicines with a sip of water on the day of your procedure.  You will need someone to drive you home following your procedure.    See Dr. Rayann Heman in 3 to 4 weeks with repeat EKG  Call the [place] office at [phone] if you have any questions, problems or concerns.       Electrical Cardioversion Electrical cardioversion is the delivery of a jolt of electricity to change the rhythm of the heart. Sticky patches or metal paddles are placed on the chest to deliver the electricity from a device. This is done to restore a normal rhythm. A rhythm that is too fast or not regular keeps the heart from pumping well. Electrical cardioversion is done in an emergency if:   There is low or no blood pressure as a result of the heart rhythm.   Normal rhythm must be restored as fast as possible to protect the brain and heart from further damage.   It may save a life. Cardioversion may be done for heart rhythms that are not immediately life-threatening, such as atrial fibrillation or flutter, in which:   The heart is beating too fast or is not regular.   Medicine to change the rhythm has not worked.   It is safe to wait in order to allow time for preparation.  Symptoms of the abnormal rhythm are bothersome.  The risk of stroke and other serious complications can be reduced. LET YOUR CAREGIVER KNOW ABOUT:   All medicines you are taking, including vitamins, herbs, eye drops, creams, and over-the-counter medicines.   Previous problems you or members  of your family have had with the use of anesthetics.   Any blood disorders you have.   Previous surgeries you have had.   Medical conditions you have. RISKS AND COMPLICATIONS  Generally, this is a safe procedure. However, as with any procedure, complications can occur. Possible complications include:   Breathing problems related to the anesthetic used.  Cardiac arrest This risk is rare.  A blood clot that breaks free and travels to other parts of your body. This could cause a stroke or other problems. The risk of this is lowered by use of blood thinning medicine (anticoagulant) prior to the procedure. BEFORE THE PROCEDURE   You may have tests to detect blood clots in your heart and evaluate heart function.  You may start taking anticoagulants so your blood does not clot as easily.   Medicines may be given to help stabilize your heart rate and rhythm. PROCEDURE  You will be given medicine through an IV tube to reduce discomfort and make you sleepy (sedative).   An electrical shock will be delivered. AFTER THE PROCEDURE Your heart rhythm will be watched to make sure it does not change.You may be able to go home within a few hours.  Document Released: 09/03/2002 Document Revised: 07/04/2013 Document Reviewed: 03/28/2013 [facility] Patient Information 2014 [place].

## 2013-11-05 NOTE — Progress Notes (Addendum)
Angel Meyer Date of Birth: 1932-11-23 Medical Record #[id number]  History of Present Illness: [name] is seen back today for a follow up visit. Seen for Dr. Rayann Heman. She is a 80 year old female with a history of AF, rheumatic fever at age 69, HTN and OSA.   Most recently presented with progressive dyspnea, orthopnea and cough for 6 to 8 weeks - noted to be in AF with RVR and volume overload. She has been treated with flecainide which was unsuccessful. Then placed on Tikosyn and was cardioverted - this also unsuccessful. Now managed with rate control and anticoagulation. Seen by PCCM for her cough/congestion and was to discuss possible amiodarone therapy. Echo shows her EF down to 30 to 35%.   Seen back by Angel Nettle, NP - "No ILD /fibrosis on CT scan -ok for Amio" noted in her note.   I saw her 3 weeks ago - still in atrial fib - good days and bad days. She has had follow up non invasive stress testing. Remains on her coumadin. Hopeful to arrange for cardioversion now that she is on amiodarone.   Comes back today. Here with her daughter. Doing ok. Still fatigued. No more PND/orthopnea. Weight fairly stable. Feels ok on her medicines. Coumadin has been therapeutic since January 15th. Anxious to proceed on with cardioversion.    Current Outpatient Prescriptions  Medication Sig Dispense Refill  . acetaminophen (TYLENOL) 650 MG CR tablet Take 650 mg by mouth every 8 (eight) hours as needed for pain.       Marland Kitchen amiodarone (PACERONE) 200 MG tablet Take 1 tablet (200 mg total) by mouth 2 (two) times daily.  60 tablet  3  . carvedilol (COREG) 25 MG tablet Take 1 tablet (25 mg total) by mouth 2 (two) times daily with a meal.  60 tablet  4  . Cholecalciferol (VITAMIN D3) 2000 UNITS TABS Take by mouth. Once a day      . diltiazem (CARTIA XT) 180 MG 24 hr capsule Take 180 mg by mouth daily.      Marland Kitchen docusate sodium (COLACE) 100 MG capsule Take 100 mg by mouth daily as needed for mild constipation.      Marland Kitchen  escitalopram (LEXAPRO) 10 MG tablet Take 1 tablet (10 mg total) by mouth daily.  90 tablet  3  . furosemide (LASIX) 40 MG tablet Take 1 tablet (40 mg total) by mouth daily.  30 tablet  4  . HYDROcodone-homatropine (HYCODAN) 5-1.5 MG/5ML syrup Take 5 mLs by mouth every 6 (six) hours as needed.      . mometasone-formoterol (DULERA) 100-5 MCG/ACT AERO Inhale 2 puffs into the lungs 2 (two) times daily.  1 Inhaler  1  . nitroGLYCERIN (NITROSTAT) 0.4 MG SL tablet Place 1 tablet (0.4 mg total) under the tongue every 5 (five) minutes as needed for chest pain.  25 tablet  3  . Omega-3 Fatty Acids (FISH OIL) 1200 MG CAPS Take by mouth. Twice a day      . oxybutynin (DITROPAN-XL) 10 MG 24 hr tablet Take 1 tablet (10 mg total) by mouth daily.  90 tablet  3  . potassium chloride SA (K-DUR,KLOR-CON) 20 MEQ tablet Take 1 tablet (20 mEq total) by mouth daily.  30 tablet  4  . rOPINIRole (REQUIP) 1 MG tablet Take 1 tablet (1 mg total) by mouth 2 (two) times daily.  180 tablet  3  . rosuvastatin (CRESTOR) 40 MG tablet Take 1 tablet (40 mg total) by mouth  daily.  90 tablet  3  . spironolactone (ALDACTONE) 25 MG tablet Take 1 tablet (25 mg total) by mouth daily.  30 tablet  3  . warfarin (COUMADIN) 2.5 MG tablet Take as directed by anticoagulation clinic  50 tablet  3  . [DISCONTINUED] oxybutynin (DITROPAN-XL) 10 MG 24 hr tablet Take 10 mg by mouth daily.       No current facility-administered medications for this visit.   Facility-Administered Medications Ordered in Other Visits  Medication Dose Route Frequency Provider Last Rate Last Dose  . propofol (DIPRIVAN) 10 mg/mL bolus/IV push    Anesthesia Intra-op Scheryl Darter, CRNA   10 mg at 09/13/13 1018    Allergies  Allergen Reactions  . Ciprofloxacin   . Demerol [Meperidine]   . Sulfa Antibiotics     Past Medical History  Diagnosis Date  . Arthritis   . Persistent atrial fibrillation     failed on Flecainide and Tikosyn  . Renal calculi   .  Hypertension   . Hyperlipidemia   . Rheumatic fever     age 64  . History of blood transfusion   . Incontinence of urine   . Diastolic dysfunction     preserved EF,  CHF In setting of afib previously  . Osteoporosis   . OSA (obstructive sleep apnea)     uses CPAP  . Restless legs   . Systolic heart failure     EF 30 to 35% per echo 08/2013    Past Surgical History  Procedure Laterality Date  . Breast surgery      benign biopsy  . Tonsillectomy    . Abdominal hysterectomy    . Spine surgery      laminectomy 1965, spinal fusion with rod 2005  . Replacement total knee  2005  . Kidney stone surgery      1997 removed  . Cardioversion N/A 09/10/2013    Procedure: CARDIOVERSION;  Surgeon: Thompson Grayer, MD;  Location: [facility] OR;  Service: Cardiovascular;  Laterality: N/A;    History  Smoking status  . Never Smoker   Smokeless tobacco  . Not on file    History  Alcohol Use  . Yes    Comment: glass of wine each day    Family History  Problem Relation Age of Onset  . Cancer Mother     breast  . Diabetes Mother   . Cancer Paternal Grandfather     Review of Systems: The review of systems is per the HPI.  All other systems were reviewed and are negative.  Physical Exam: Pulse 85  Ht 5\' 1"  (1.549 m)  Wt 175 lb 12.8 oz (79.742 kg)  BMI 33.23 kg/m2 BP is 100/60 by me in the left arm.  Patient is very pleasant and in no acute distress. Skin is warm and dry. Color is normal.  HEENT is unremarkable. Normocephalic/atraumatic. PERRL. Sclera are nonicteric. Neck is supple. No masses. No JVD. Lungs are clear. Cardiac exam shows an irregular rhythm. Rate ok. Abdomen is soft. Extremities are without edema. Gait and ROM are intact. No gross neurologic deficits noted.  Wt Readings from Last 3 Encounters:  11/05/13 175 lb 12.8 oz (79.742 kg)  11/01/13 177 lb (80.287 kg)  10/31/13 172 lb (78.019 kg)     LABORATORY DATA: Lab Results  Component Value Date   WBC 5.4 10/08/2013    HGB 12.3 10/08/2013   HCT 37.0 10/08/2013   PLT 122.0* 10/08/2013   GLUCOSE 87 10/17/2013  CHOL 169 12/20/2011   TRIG 108.0 12/20/2011   HDL 78.80 12/20/2011   LDLCALC 69 12/20/2011   ALT 18 12/20/2011   AST 26 12/20/2011   NA 138 10/17/2013   K 4.4 10/17/2013   CL 104 10/17/2013   CREATININE 1.0 10/17/2013   BUN 23 10/17/2013   CO2 25 10/17/2013   TSH 1.875 09/08/2013   INR 3.3 11/01/2013   Lab Results  Component Value Date   INR 3.3 11/01/2013   INR 2.7 10/25/2013   INR 4.5 10/18/2013   Myoview Impression from February 2015  Exercise Capacity: [place] with no exercise.  BP Response: Hypotensive blood pressure response.  Clinical Symptoms: There is chest heaviness.  ECG Impression: No significant ST segment change suggestive of ischemia.  Comparison with Prior Nuclear Study: No images to compare  Overall Impression: Normal stress nuclear study.  LV Ejection Fraction: Study not gated. LV Wall Motion: NA  Kirk Ruths  Echo Study Conclusions from December 2014  - Left ventricle: The cavity size was normal. Wall thickness was normal. Systolic function was moderately to severely reduced. The estimated ejection fraction was in the range of 30% to 35%. Diffuse hypokinesis. - Left atrium: The atrium was moderately dilated. - Right atrium: The atrium was moderately dilated. - Pulmonary arteries: PA peak pressure: 95mm Hg (S).   Assessment / Plan: 1. Atrial fib - on amiodarone - anticoagulated with Coumadin - has been therapeutic - will proceed with scheduling cardioversion for next week - the procedure, risks and benefits have been reviewed and she is willing to proceed.   2. LV Dysfunction - looks compensated  3. Chronic anticoagulation  4. Satisfactory Myoview   Patient is agreeable to this plan and will call if any problems develop in the interim.   Burtis Junes, RN, [place] [address] [place], [place]  [phone]   Records received on 11/06/13 from Dr. Kerry Dory from [place] - I can see where she was put on amiodarone back in 2012 - then her dose was cut back and then stopped in May of 2013 - not clear as to why. Looks like there was some shortness of breath and fatigue noted and low HR. Her diagnosis was always listed as "paroxysmal AF" and not chronic. Will proceed on with our plan of care.

## 2013-11-06 ENCOUNTER — Telehealth: Payer: Self-pay | Admitting: Nurse Practitioner

## 2013-11-06 ENCOUNTER — Other Ambulatory Visit: Payer: Self-pay | Admitting: *Deleted

## 2013-11-06 DIAGNOSIS — M25519 Pain in unspecified shoulder: Secondary | ICD-10-CM | POA: Diagnosis not present

## 2013-11-06 DIAGNOSIS — M6281 Muscle weakness (generalized): Secondary | ICD-10-CM | POA: Diagnosis not present

## 2013-11-06 MED ORDER — FUROSEMIDE 40 MG PO TABS: 20.0000 mg | ORAL_TABLET | Freq: Every day | ORAL | Status: DC

## 2013-11-06 NOTE — Telephone Encounter (Signed)
Follow up  Pt return call for results//SR

## 2013-11-06 NOTE — Telephone Encounter (Signed)
Records rec from Dr.Wheat gave to Rainey Pines

## 2013-11-08 ENCOUNTER — Ambulatory Visit (INDEPENDENT_AMBULATORY_CARE_PROVIDER_SITE_OTHER): Payer: Medicare Other | Admitting: General Practice

## 2013-11-08 DIAGNOSIS — I4891 Unspecified atrial fibrillation: Secondary | ICD-10-CM | POA: Diagnosis not present

## 2013-11-08 DIAGNOSIS — M25519 Pain in unspecified shoulder: Secondary | ICD-10-CM | POA: Diagnosis not present

## 2013-11-08 DIAGNOSIS — M6281 Muscle weakness (generalized): Secondary | ICD-10-CM | POA: Diagnosis not present

## 2013-11-08 LAB — POCT INR: INR: 3.2

## 2013-11-08 NOTE — Progress Notes (Signed)
Pre-visit discussion using our clinic review tool. No additional management support is needed unless otherwise documented below in the visit note.  

## 2013-11-09 ENCOUNTER — Telehealth: Payer: Self-pay | Admitting: General Practice

## 2013-11-09 NOTE — Telephone Encounter (Signed)
LMOM for patient or dtr to call Villa Herb, RN @ [facility] [phone]).

## 2013-11-09 NOTE — Telephone Encounter (Signed)
Incoming call from patient's daughter, [name] verbalizing understanding of coumadin dosage of 2.5 mg daily except 5 mg on Sunday and Wednesday.

## 2013-11-13 ENCOUNTER — Ambulatory Visit: Payer: Medicare Other | Admitting: Pulmonary Disease

## 2013-11-15 ENCOUNTER — Ambulatory Visit (INDEPENDENT_AMBULATORY_CARE_PROVIDER_SITE_OTHER): Payer: Medicare Other | Admitting: General Practice

## 2013-11-15 DIAGNOSIS — I4891 Unspecified atrial fibrillation: Secondary | ICD-10-CM | POA: Diagnosis not present

## 2013-11-15 DIAGNOSIS — M6281 Muscle weakness (generalized): Secondary | ICD-10-CM | POA: Diagnosis not present

## 2013-11-15 DIAGNOSIS — M25519 Pain in unspecified shoulder: Secondary | ICD-10-CM | POA: Diagnosis not present

## 2013-11-15 LAB — POCT INR: INR: 2.1

## 2013-11-15 NOTE — Progress Notes (Signed)
Pre-visit discussion using our clinic review tool. No additional management support is needed unless otherwise documented below in the visit note.  

## 2013-11-16 ENCOUNTER — Encounter (HOSPITAL_COMMUNITY)
Admission: RE | Disposition: A | Discharge status: Home / Self Care | Payer: Self-pay | Source: Ambulatory Visit | Attending: Cardiology

## 2013-11-16 ENCOUNTER — Encounter (HOSPITAL_COMMUNITY): Payer: Medicare Other | Admitting: Critical Care Medicine

## 2013-11-16 ENCOUNTER — Ambulatory Visit (HOSPITAL_COMMUNITY)
Admission: RE | Admit: 2013-11-16 | Discharge: 2013-11-16 | Disposition: A | Discharge status: Home / Self Care | Payer: Medicare Other | Source: Ambulatory Visit | Attending: Cardiology | Admitting: Cardiology

## 2013-11-16 ENCOUNTER — Encounter (HOSPITAL_COMMUNITY): Payer: Self-pay | Admitting: Critical Care Medicine

## 2013-11-16 ENCOUNTER — Ambulatory Visit (HOSPITAL_COMMUNITY): Payer: Medicare Other | Admitting: Critical Care Medicine

## 2013-11-16 DIAGNOSIS — G2581 Restless legs syndrome: Secondary | ICD-10-CM | POA: Insufficient documentation

## 2013-11-16 DIAGNOSIS — Z79899 Other long term (current) drug therapy: Secondary | ICD-10-CM

## 2013-11-16 DIAGNOSIS — G4733 Obstructive sleep apnea (adult) (pediatric): Secondary | ICD-10-CM | POA: Diagnosis not present

## 2013-11-16 DIAGNOSIS — E785 Hyperlipidemia, unspecified: Secondary | ICD-10-CM | POA: Diagnosis not present

## 2013-11-16 DIAGNOSIS — I5020 Unspecified systolic (congestive) heart failure: Secondary | ICD-10-CM | POA: Diagnosis not present

## 2013-11-16 DIAGNOSIS — I4891 Unspecified atrial fibrillation: Secondary | ICD-10-CM

## 2013-11-16 DIAGNOSIS — I10 Essential (primary) hypertension: Secondary | ICD-10-CM | POA: Insufficient documentation

## 2013-11-16 DIAGNOSIS — R32 Unspecified urinary incontinence: Secondary | ICD-10-CM | POA: Insufficient documentation

## 2013-11-16 DIAGNOSIS — M810 Age-related osteoporosis without current pathological fracture: Secondary | ICD-10-CM | POA: Insufficient documentation

## 2013-11-16 DIAGNOSIS — Z7901 Long term (current) use of anticoagulants: Secondary | ICD-10-CM | POA: Insufficient documentation

## 2013-11-16 HISTORY — PX: CARDIOVERSION: SHX1299

## 2013-11-16 LAB — POCT I-STAT EG7
Acid-base deficit: 1 mmol/L (ref 0.0–2.0)
Bicarbonate: 24.5 mEq/L — ABNORMAL HIGH (ref 20.0–24.0)
Calcium, Ion: 1.31 mmol/L — ABNORMAL HIGH (ref 1.13–1.30)
HEMATOCRIT: 49 % — AB (ref 36.0–46.0)
HEMOGLOBIN: 16.7 g/dL — AB (ref 12.0–15.0)
O2 Saturation: 56 %
PH VEN: 7.367 — AB (ref 7.250–7.300)
POTASSIUM: 4.8 meq/L (ref 3.7–5.3)
Sodium: 139 mEq/L (ref 137–147)
TCO2: 26 mmol/L (ref 0–100)
pCO2, Ven: 42.6 mmHg — ABNORMAL LOW (ref 45.0–50.0)
pO2, Ven: 30 mmHg (ref 30.0–45.0)

## 2013-11-16 SURGERY — CARDIOVERSION
Anesthesia: Monitor Anesthesia Care

## 2013-11-16 MED ORDER — LIDOCAINE HCL (CARDIAC) 20 MG/ML IV SOLN
INTRAVENOUS | Status: DC | PRN
  Administered 2013-11-16: 40 mg via INTRAVENOUS

## 2013-11-16 MED ORDER — SODIUM CHLORIDE 0.9 % IV SOLN
INTRAVENOUS | Status: DC
  Administered 2013-11-16: 12:00:00 via INTRAVENOUS

## 2013-11-16 NOTE — CV Procedure (Signed)
Electrical Cardioversion Procedure Note Angel Meyer [id number] 06/09/1933  Procedure: Electrical Cardioversion Indications:  Atrial Fibrillation  Time Out: Verified patient identification, verified procedure,medications/allergies/relevent history reviewed, required imaging and test results available.  Performed  Procedure Details  The patient was NPO after midnight. Anesthesia was administered at the beside  by Dr.Fitzgerald with 80mg  of propofol and 40mg  of Lidocaine.  Cardioversion was done with synchronized biphasic defibrillation with AP pads with 150watts.  The patient converted to normal sinus bradycardia. The patient tolerated the procedure well   IMPRESSION:  Successful cardioversion of atrial fibrillation    Wayland Baik R 11/16/2013, 12:25 PM

## 2013-11-16 NOTE — Interval H&P Note (Signed)
History and Physical Interval Note:  11/16/2013 11:54 AM  [name]  has presented today for surgery, with the diagnosis of afib  The various methods of treatment have been discussed with the patient and family. After consideration of risks, benefits and other options for treatment, the patient has consented to  Procedure(s): CARDIOVERSION (N/A) as a surgical intervention .  The patient's history has been reviewed, patient examined, no change in status, stable for surgery.  I have reviewed the patient's chart and labs.  Questions were answered to the patient's satisfaction.     [name],[name]

## 2013-11-16 NOTE — Transfer of Care (Signed)
Immediate Anesthesia Transfer of Care Note  Patient: Angel Meyer  Procedure(s) Performed: Procedure(s): CARDIOVERSION (N/A)  Patient Location: Endoscopy Unit  Anesthesia Type:MAC  Level of Consciousness: awake, alert  and oriented  Airway & Oxygen Therapy: Patient Spontanous Breathing  Post-op Assessment: Report given to PACU RN, Post -op Vital signs reviewed and stable and Patient moving all extremities X 4  Post vital signs: Reviewed and stable  Complications: No apparent anesthesia complications

## 2013-11-16 NOTE — Progress Notes (Signed)
Patient's HR in the mid to upper 40's post DCCV.  Will hold Coreg and Cardizem until seen by Truitt Merle on Tuesday 2/25.  Continue amio at 200mg  BID

## 2013-11-16 NOTE — H&P (View-Only) (Signed)
Angel Meyer Date of Birth: 1932-11-23 Medical Record #[id number]  History of Present Illness: Ms. Angel Meyer is seen back today for a follow up visit. Seen for Dr. Rayann Heman. She is a 80 year old female with a history of AF, rheumatic fever at age 69, HTN and OSA.   Most recently presented with progressive dyspnea, orthopnea and cough for 6 to 8 weeks - noted to be in AF with RVR and volume overload. She has been treated with flecainide which was unsuccessful. Then placed on Tikosyn and was cardioverted - this also unsuccessful. Now managed with rate control and anticoagulation. Seen by PCCM for her cough/congestion and was to discuss possible amiodarone therapy. Echo shows her EF down to 30 to 35%.   Seen back by [name], NP - "No ILD /fibrosis on CT scan -ok for Amio" noted in her note.   I saw her 3 weeks ago - still in atrial fib - good days and bad days. She has had follow up non invasive stress testing. Remains on her coumadin. Hopeful to arrange for cardioversion now that she is on amiodarone.   Comes back today. Here with her daughter. Doing ok. Still fatigued. No more PND/orthopnea. Weight fairly stable. Feels ok on her medicines. Coumadin has been therapeutic since January 15th. Anxious to proceed on with cardioversion.    Current Outpatient Prescriptions  Medication Sig Dispense Refill  . acetaminophen (TYLENOL) 650 MG CR tablet Take 650 mg by mouth every 8 (eight) hours as needed for pain.       Marland Kitchen amiodarone (PACERONE) 200 MG tablet Take 1 tablet (200 mg total) by mouth 2 (two) times daily.  60 tablet  3  . carvedilol (COREG) 25 MG tablet Take 1 tablet (25 mg total) by mouth 2 (two) times daily with a meal.  60 tablet  4  . Cholecalciferol (VITAMIN D3) 2000 UNITS TABS Take by mouth. Once a day      . diltiazem (CARTIA XT) 180 MG 24 hr capsule Take 180 mg by mouth daily.      Marland Kitchen docusate sodium (COLACE) 100 MG capsule Take 100 mg by mouth daily as needed for mild constipation.      Marland Kitchen  escitalopram (LEXAPRO) 10 MG tablet Take 1 tablet (10 mg total) by mouth daily.  90 tablet  3  . furosemide (LASIX) 40 MG tablet Take 1 tablet (40 mg total) by mouth daily.  30 tablet  4  . HYDROcodone-homatropine (HYCODAN) 5-1.5 MG/5ML syrup Take 5 mLs by mouth every 6 (six) hours as needed.      . mometasone-formoterol (DULERA) 100-5 MCG/ACT AERO Inhale 2 puffs into the lungs 2 (two) times daily.  1 Inhaler  1  . nitroGLYCERIN (NITROSTAT) 0.4 MG SL tablet Place 1 tablet (0.4 mg total) under the tongue every 5 (five) minutes as needed for chest pain.  25 tablet  3  . Omega-3 Fatty Acids (FISH OIL) 1200 MG CAPS Take by mouth. Twice a day      . oxybutynin (DITROPAN-XL) 10 MG 24 hr tablet Take 1 tablet (10 mg total) by mouth daily.  90 tablet  3  . potassium chloride SA (K-DUR,KLOR-CON) 20 MEQ tablet Take 1 tablet (20 mEq total) by mouth daily.  30 tablet  4  . rOPINIRole (REQUIP) 1 MG tablet Take 1 tablet (1 mg total) by mouth 2 (two) times daily.  180 tablet  3  . rosuvastatin (CRESTOR) 40 MG tablet Take 1 tablet (40 mg total) by mouth  daily.  90 tablet  3  . spironolactone (ALDACTONE) 25 MG tablet Take 1 tablet (25 mg total) by mouth daily.  30 tablet  3  . warfarin (COUMADIN) 2.5 MG tablet Take as directed by anticoagulation clinic  50 tablet  3  . [DISCONTINUED] oxybutynin (DITROPAN-XL) 10 MG 24 hr tablet Take 10 mg by mouth daily.       No current facility-administered medications for this visit.   Facility-Administered Medications Ordered in Other Visits  Medication Dose Route Frequency Provider Last Rate Last Dose  . propofol (DIPRIVAN) 10 mg/mL bolus/IV push    Anesthesia Intra-op Scheryl Darter, CRNA   10 mg at 09/13/13 1018    Allergies  Allergen Reactions  . Ciprofloxacin   . Demerol [Meperidine]   . Sulfa Antibiotics     Past Medical History  Diagnosis Date  . Arthritis   . Persistent atrial fibrillation     failed on Flecainide and Tikosyn  . Renal calculi   .  Hypertension   . Hyperlipidemia   . Rheumatic fever     age 64  . History of blood transfusion   . Incontinence of urine   . Diastolic dysfunction     preserved EF,  CHF In setting of afib previously  . Osteoporosis   . OSA (obstructive sleep apnea)     uses CPAP  . Restless legs   . Systolic heart failure     EF 30 to 35% per echo 08/2013    Past Surgical History  Procedure Laterality Date  . Breast surgery      benign biopsy  . Tonsillectomy    . Abdominal hysterectomy    . Spine surgery      laminectomy 1965, spinal fusion with rod 2005  . Replacement total knee  2005  . Kidney stone surgery      1997 removed  . Cardioversion N/A 09/10/2013    Procedure: CARDIOVERSION;  Surgeon: Thompson Grayer, MD;  Location: [facility] OR;  Service: Cardiovascular;  Laterality: N/A;    History  Smoking status  . Never Smoker   Smokeless tobacco  . Not on file    History  Alcohol Use  . Yes    Comment: glass of wine each day    Family History  Problem Relation Age of Onset  . Cancer Mother     breast  . Diabetes Mother   . Cancer Paternal Grandfather     Review of Systems: The review of systems is per the HPI.  All other systems were reviewed and are negative.  Physical Exam: Pulse 85  Ht 5\' 1"  (1.549 m)  Wt 175 lb 12.8 oz (79.742 kg)  BMI 33.23 kg/m2 BP is 100/60 by me in the left arm.  Patient is very pleasant and in no acute distress. Skin is warm and dry. Color is normal.  HEENT is unremarkable. Normocephalic/atraumatic. PERRL. Sclera are nonicteric. Neck is supple. No masses. No JVD. Lungs are clear. Cardiac exam shows an irregular rhythm. Rate ok. Abdomen is soft. Extremities are without edema. Gait and ROM are intact. No gross neurologic deficits noted.  Wt Readings from Last 3 Encounters:  11/05/13 175 lb 12.8 oz (79.742 kg)  11/01/13 177 lb (80.287 kg)  10/31/13 172 lb (78.019 kg)     LABORATORY DATA: Lab Results  Component Value Date   WBC 5.4 10/08/2013    HGB 12.3 10/08/2013   HCT 37.0 10/08/2013   PLT 122.0* 10/08/2013   GLUCOSE 87 10/17/2013  CHOL 169 12/20/2011   TRIG 108.0 12/20/2011   HDL 78.80 12/20/2011   LDLCALC 69 12/20/2011   ALT 18 12/20/2011   AST 26 12/20/2011   NA 138 10/17/2013   K 4.4 10/17/2013   CL 104 10/17/2013   CREATININE 1.0 10/17/2013   BUN 23 10/17/2013   CO2 25 10/17/2013   TSH 1.875 09/08/2013   INR 3.3 11/01/2013   Lab Results  Component Value Date   INR 3.3 11/01/2013   INR 2.7 10/25/2013   INR 4.5 10/18/2013   Myoview Impression from February 2015  Exercise Capacity: [place] with no exercise.  BP Response: Hypotensive blood pressure response.  Clinical Symptoms: There is chest heaviness.  ECG Impression: No significant ST segment change suggestive of ischemia.  Comparison with Prior Nuclear Study: No images to compare  Overall Impression: Normal stress nuclear study.  LV Ejection Fraction: Study not gated. LV Wall Motion: NA  Kirk Ruths  Echo Study Conclusions from December 2014  - Left ventricle: The cavity size was normal. Wall thickness was normal. Systolic function was moderately to severely reduced. The estimated ejection fraction was in the range of 30% to 35%. Diffuse hypokinesis. - Left atrium: The atrium was moderately dilated. - Right atrium: The atrium was moderately dilated. - Pulmonary arteries: PA peak pressure: 10mm Hg (S).   Assessment / Plan: 1. Atrial fib - on amiodarone - anticoagulated with Coumadin - has been therapeutic - will proceed with scheduling cardioversion for next week - the procedure, risks and benefits have been reviewed and she is willing to proceed.   2. LV Dysfunction - looks compensated  3. Chronic anticoagulation  4. Satisfactory Myoview   Patient is agreeable to this plan and will call if any problems develop in the interim.   Burtis Junes, RN, [facility] [address] [place], [place]  [address] [phone]   Records received on 11/06/13 from Dr. Kerry Dory from [place] - I can see where she was put on amiodarone back in 2012 - then her dose was cut back and then stopped in May of 2013 - not clear as to why. Looks like there was some shortness of breath and fatigue noted and low HR. Her diagnosis was always listed as "paroxysmal AF" and not chronic. Will proceed on with our plan of care.

## 2013-11-16 NOTE — Preoperative (Signed)
Beta Blockers   Reason not to administer Beta Blockers:Not Applicable, pt took coreg 11/16/13

## 2013-11-16 NOTE — Anesthesia Postprocedure Evaluation (Signed)
  Anesthesia Post-op Note  Patient: Angel Meyer  Procedure(s) Performed: Procedure(s): CARDIOVERSION (N/A)  Patient Location: Endoscopy Unit  Anesthesia Type:MAC  Level of Consciousness: awake, alert  and oriented  Airway and Oxygen Therapy: Patient Spontanous Breathing  Post-op Pain: none  Post-op Assessment: Post-op Vital signs reviewed, Patient's Cardiovascular Status Stable, Respiratory Function Stable, Patent Airway and No signs of Nausea or vomiting  Post-op Vital Signs: Reviewed and stable  Complications: No apparent anesthesia complications

## 2013-11-16 NOTE — Discharge Instructions (Addendum)
Monitored Anesthesia Care  °Monitored anesthesia care is an anesthesia service for a medical procedure. Anesthesia is the loss of the ability to feel pain. It is produced by medications called anesthetics. It may affect a small area of your body (local anesthesia), a large area of your body (regional anesthesia), or your entire body (general anesthesia). The need for monitored anesthesia care depends your procedure, your condition, and the potential need for regional or general anesthesia. It is often provided during procedures where:  °· General anesthesia may be needed if there are complications. This is because you need special care when you are under general anesthesia.   °· You will be under local or regional anesthesia. This is so that you are able to have higher levels of anesthesia if needed.   °· You will receive calming medications (sedatives). This is especially the case if sedatives are given to put you in a semi-conscious state of relaxation (deep sedation). This is because the amount of sedative needed to produce this state can be hard to predict. Too much of a sedative can produce general anesthesia. °Monitored anesthesia care is performed by one or more caregivers who have special training in all types of anesthesia. You will need to meet with these caregivers before your procedure. During this meeting, they will ask you about your medical history. They will also give you instructions to follow. (For example, you will need to stop eating and drinking before your procedure. You may also need to stop or change medications you are taking.) During your procedure, your caregivers will stay with you. They will:  °· Watch your condition. This includes watching you blood pressure, breathing, and level of pain.   °· Diagnose and treat problems that occur.   °· Give medications if they are needed. These may include calming medications (sedatives) and anesthetics.   °· Make sure you are comfortable.   °Having  monitored anesthesia care does not necessarily mean that you will be under anesthesia. It does mean that your caregivers will be able to manage anesthesia if you need it or if it occurs. It also means that you will be able to have a different type of anesthesia than you are having if you need it. When your procedure is complete, your caregivers will continue to watch your condition. They will make sure any medications wear off before you are allowed to go home.  °Document Released: 06/09/2005 Document Revised: 01/08/2013 Document Reviewed: 10/25/2012 °Exit[employer]® Patient Information ©2014 [employer]. °Electrical Cardioversion °Electrical cardioversion is the delivery of a jolt of electricity to change the rhythm of the heart. Sticky patches or metal paddles are placed on the chest to deliver the electricity from a device. This is done to restore a normal rhythm. A rhythm that is too fast or not regular keeps the heart from pumping well. °Electrical cardioversion is done in an emergency if:  °· There is low or no blood pressure as a result of the heart rhythm.   °· Normal rhythm must be restored as fast as possible to protect the brain and heart from further damage.   °· It may save a life. °Cardioversion may be done for heart rhythms that are not immediately life-threatening, such as atrial fibrillation or flutter, in which:  °· The heart is beating too fast or is not regular.   °· Medicine to change the rhythm has not worked.   °· It is safe to wait in order to allow time for preparation. °· Symptoms of the abnormal rhythm are bothersome. °· The risk of   stroke and other serious complications can be reduced. °LET YOUR CAREGIVER KNOW ABOUT:  °· All medicines you are taking, including vitamins, herbs, eye drops, creams, and over-the-counter medicines.   °· Previous problems you or members of your family have had with the use of anesthetics.   °· Any blood disorders you have.   °· Previous surgeries you have had.    °· Medical conditions you have. °RISKS AND COMPLICATIONS  °Generally, this is a safe procedure. However, as with any procedure, complications can occur. Possible complications include:  °· Breathing problems related to the anesthetic used. °· Cardiac arrest This risk is rare. °· A blood clot that breaks free and travels to other parts of your body. This could cause a stroke or other problems. The risk of this is lowered by use of blood thinning medicine (anticoagulant) prior to the procedure. °BEFORE THE PROCEDURE  °· You may have tests to detect blood clots in your heart and evaluate heart function.  °· You may start taking anticoagulants so your blood does not clot as easily.   °· Medicines may be given to help stabilize your heart rate and rhythm. °PROCEDURE °· You will be given medicine through an IV tube to reduce discomfort and make you sleepy (sedative).   °· An electrical shock will be delivered. °AFTER THE PROCEDURE °Your heart rhythm will be watched to make sure it does not change. You may be able to go home within a few hours.  °Document Released: 09/03/2002 Document Revised: 07/04/2013 Document Reviewed: 03/28/2013 °Exit[employer]® Patient Information ©2014 [employer]. ° °

## 2013-11-16 NOTE — Anesthesia Preprocedure Evaluation (Signed)
Anesthesia Evaluation  Patient identified by MRN, date of birth, ID band Patient awake    Reviewed: Allergy & Precautions, H&P , NPO status , Patient's Chart, lab work & pertinent test results  Airway       Dental  (+) Dental Advisory Given   Pulmonary asthma , sleep apnea ,          Cardiovascular hypertension, Pt. on medications and Pt. on home beta blockers +CHF  Echo 30-35% per echo 12/14   Neuro/Psych    GI/Hepatic   Endo/Other    Renal/GU Renal disease     Musculoskeletal  (+) Arthritis -,   Abdominal   Peds  Hematology   Anesthesia Other Findings   Reproductive/Obstetrics                           Anesthesia Physical Anesthesia Plan  ASA: III  Anesthesia Plan: MAC   Post-op Pain Management:    Induction: Intravenous  Airway Management Planned: Mask  Additional Equipment:   Intra-op Plan:   Post-operative Plan:   Informed Consent: I have reviewed the patients History and Physical, chart, labs and discussed the procedure including the risks, benefits and alternatives for the proposed anesthesia with the patient or authorized representative who has indicated his/her understanding and acceptance.   Dental advisory given  Plan Discussed with: Anesthesiologist and Surgeon  Anesthesia Plan Comments:         Anesthesia Quick Evaluation

## 2013-11-19 ENCOUNTER — Encounter (HOSPITAL_COMMUNITY): Payer: Self-pay | Admitting: Cardiology

## 2013-11-19 DIAGNOSIS — H11439 Conjunctival hyperemia, unspecified eye: Secondary | ICD-10-CM | POA: Diagnosis not present

## 2013-11-19 DIAGNOSIS — M2420 Disorder of ligament, unspecified site: Secondary | ICD-10-CM | POA: Diagnosis not present

## 2013-11-19 DIAGNOSIS — H02039 Senile entropion of unspecified eye, unspecified eyelid: Secondary | ICD-10-CM | POA: Diagnosis not present

## 2013-11-19 DIAGNOSIS — H16149 Punctate keratitis, unspecified eye: Secondary | ICD-10-CM | POA: Diagnosis not present

## 2013-11-19 DIAGNOSIS — H16219 Exposure keratoconjunctivitis, unspecified eye: Secondary | ICD-10-CM | POA: Diagnosis not present

## 2013-11-20 ENCOUNTER — Telehealth: Payer: Self-pay | Admitting: Internal Medicine

## 2013-11-20 ENCOUNTER — Ambulatory Visit: Payer: Medicare Other | Admitting: Nurse Practitioner

## 2013-11-20 NOTE — Telephone Encounter (Signed)
Spoke w/Daughter.  Rescheduled her appointment w/Lori for 3/4 at 2:00.

## 2013-11-20 NOTE — Telephone Encounter (Signed)
New message     Pt had to cancel appt due to weather,  Next available appt is 3-13 with Cecille Rubin and 3-9 with Dr Rayann Heman.  Will it be OK to wait until 3-9 or should pt be worked in sooner?

## 2013-11-26 ENCOUNTER — Telehealth: Payer: Self-pay | Admitting: Nurse Practitioner

## 2013-11-26 DIAGNOSIS — H01029 Squamous blepharitis unspecified eye, unspecified eyelid: Secondary | ICD-10-CM | POA: Diagnosis not present

## 2013-11-26 DIAGNOSIS — H16219 Exposure keratoconjunctivitis, unspecified eye: Secondary | ICD-10-CM | POA: Diagnosis not present

## 2013-11-26 DIAGNOSIS — M2420 Disorder of ligament, unspecified site: Secondary | ICD-10-CM | POA: Diagnosis not present

## 2013-11-26 DIAGNOSIS — L679 Hair color and hair shaft abnormality, unspecified: Secondary | ICD-10-CM | POA: Diagnosis not present

## 2013-11-26 DIAGNOSIS — H11439 Conjunctival hyperemia, unspecified eye: Secondary | ICD-10-CM | POA: Diagnosis not present

## 2013-11-26 NOTE — Telephone Encounter (Signed)
Records Rec From Dr.Micheal [facility], gave to Operator Jari Sportsman

## 2013-11-28 ENCOUNTER — Encounter: Payer: Self-pay | Admitting: Nurse Practitioner

## 2013-11-28 ENCOUNTER — Ambulatory Visit (INDEPENDENT_AMBULATORY_CARE_PROVIDER_SITE_OTHER): Payer: Medicare Other | Admitting: Nurse Practitioner

## 2013-11-28 VITALS — BP 100/60 | HR 59 | Ht 61.0 in | Wt 177.8 lb

## 2013-11-28 DIAGNOSIS — I5021 Acute systolic (congestive) heart failure: Secondary | ICD-10-CM | POA: Diagnosis not present

## 2013-11-28 DIAGNOSIS — J069 Acute upper respiratory infection, unspecified: Secondary | ICD-10-CM

## 2013-11-28 DIAGNOSIS — I4891 Unspecified atrial fibrillation: Secondary | ICD-10-CM | POA: Diagnosis not present

## 2013-11-28 MED ORDER — AMIODARONE HCL 200 MG PO TABS: 200.0000 mg | ORAL_TABLET | Freq: Every day | ORAL | Status: DC

## 2013-11-28 NOTE — Patient Instructions (Addendum)
Continue with your current medicines but cut the amiodarone back to just one a day  Let your primary doctor know we are cutting this back - you may end up on a higher dose of Coumadin  Ok to proceed with your eye surgery - but wait 30 days after your cardioversion - ok to hold coumadin for 5 days prior  Do not need to see Dr. Rayann Heman next week  I will see you in 6 to 8 weeks.  Call the [place] office at [phone] if you have any questions, problems or concerns.

## 2013-11-28 NOTE — Progress Notes (Signed)
Angel Meyer Date of Birth: 1932/12/02 Medical Record #[id number]  History of Present Illness: Angel Meyer is seen back today for a post cardioversion visit. Seen for Dr. Rayann Heman. She has PAF, rheumatic fever at age 80, HTN and OSA.   Presented most recently with progressive dyspnea, orthopnea and cough for 6 to 8 weeks - noted to be in AF with RVR and volume overload. She was treated with flecainide which was unsuccessful. Then placed on Tikosyn and was cardioverted - this also unsuccessful. Now managed with rate control and anticoagulation. Seen by PCCM for her cough/congestion and was to discuss possible amiodarone therapy which was subsequently started. Echo showed her EF down to 30 to 35%. We proceeded on with cardioversion which was successful. She was bradycardic following the procedure and her Coreg and Diltiazem were stopped.   I was able to review some of her records from [place] received on 11/06/13 from Dr. Kerry Dory from [place] - I can see where she was put on amiodarone back in 2012 - then her dose was cut back and then stopped in May of 2013 along with discontinuation of her Coreg - not clear to me as to why. Looks like there was some shortness of breath and fatigue noted and low HR. Her diagnosis was always listed as "paroxysmal AF" and not chronic. Fatigue and DOE seemed to be a prevalent complaint.   Comes back today. Here with her daughter. She is doing well. Really does not feel any different in regards to her fatigue or shortness of breath. Not very active. Denies being depressed but notes she is needing to make lots of "life changes" and probably permanently move here.  No palpitations. Really can't tell if she is in or out of rhythm. No chest pain. 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 Rayann Heman.    Current Outpatient Prescriptions  Medication Sig Dispense  Refill  . acetaminophen (TYLENOL) 650 MG CR tablet Take 650 mg by mouth every 8 (eight) hours as needed for pain.       Marland Kitchen amiodarone (PACERONE) 200 MG tablet Take 1 tablet (200 mg total) by mouth 2 (two) times daily.  60 tablet  3  . Cholecalciferol (VITAMIN D3) 2000 UNITS TABS Take by mouth. Once a day      . docusate sodium (COLACE) 100 MG capsule Take 100 mg by mouth daily as needed for mild constipation.      Marland Kitchen escitalopram (LEXAPRO) 10 MG tablet Take 1 tablet (10 mg total) by mouth daily.  90 tablet  3  . furosemide (LASIX) 40 MG tablet Take 0.5 tablets (20 mg total) by mouth daily.  30 tablet  4  . HYDROcodone-homatropine (HYCODAN) 5-1.5 MG/5ML syrup Take 5 mLs by mouth every 6 (six) hours as needed.      . nitroGLYCERIN (NITROSTAT) 0.4 MG SL tablet Place 1 tablet (0.4 mg total) under the tongue every 5 (five) minutes as needed for chest pain.  25 tablet  3  . Omega-3 Fatty Acids (FISH OIL) 1200 MG CAPS Take by mouth. Twice a day      . oxybutynin (DITROPAN-XL) 10 MG 24 hr tablet Take 1 tablet (10 mg total) by mouth daily.  90 tablet  3  . potassium chloride SA (K-DUR,KLOR-CON) 20 MEQ tablet Take 1 tablet (20 mEq total) by mouth daily.  30 tablet  4  . rOPINIRole (REQUIP) 1 MG tablet Take 1 tablet (1 mg total) by mouth 2 (two) times daily.  180 tablet  3  . rosuvastatin (CRESTOR) 40 MG tablet Take 1 tablet (40 mg total) by mouth daily.  90 tablet  3  . spironolactone (ALDACTONE) 25 MG tablet Take 1 tablet (25 mg total) by mouth daily.  30 tablet  3  . warfarin (COUMADIN) 2.5 MG tablet Take as directed by anticoagulation clinic  50 tablet  3  . [DISCONTINUED] oxybutynin (DITROPAN-XL) 10 MG 24 hr tablet Take 10 mg by mouth daily.       No current facility-administered medications for this visit.   Facility-Administered Medications Ordered in Other Visits  Medication Dose Route Frequency Provider Last Rate Last Dose  . propofol (DIPRIVAN) 10 mg/mL bolus/IV push    Anesthesia Intra-op Scheryl Darter, CRNA   30 mg at 11/16/13 1232    Allergies  Allergen Reactions  . Ciprofloxacin   . Demerol [Meperidine]   . Sulfa Antibiotics     Past Medical History  Diagnosis Date  . Arthritis   . Persistent atrial fibrillation     failed on Flecainide and Tikosyn  . Renal calculi   . Hypertension   . Hyperlipidemia   . Rheumatic fever     age 68  . History of blood transfusion   . Incontinence of urine   . Diastolic dysfunction     preserved EF,  CHF In setting of afib previously  . Osteoporosis   . OSA (obstructive sleep apnea)     uses CPAP  . Restless legs   . Systolic heart failure     EF 30 to 35% per echo 08/2013  . Dysrhythmia     afib    Past Surgical History  Procedure Laterality Date  . Breast surgery      benign biopsy  . Tonsillectomy    . Abdominal hysterectomy    . Spine surgery      laminectomy 1965, spinal fusion with rod 2005  . Replacement total knee  2005  . Kidney stone surgery      1997 removed  . Cardioversion N/A 09/10/2013    Procedure: CARDIOVERSION;  Surgeon: Thompson Grayer, MD;  Location: [facility];  Service: Cardiovascular;  Laterality: N/A;  . Cardioversion N/A 11/16/2013    Procedure: CARDIOVERSION;  Surgeon: Sueanne Margarita, MD;  Location: [facility];  Service: Cardiovascular;  Laterality: N/A;    History  Smoking status  . Never Smoker   Smokeless tobacco  . Not on file    History  Alcohol Use  . Yes    Comment: glass of wine each day    Family History  Problem Relation Age of Onset  . Cancer Mother     breast  . Diabetes Mother   . Cancer Paternal Grandfather     Review of Systems: The review of systems is per the HPI.  All other systems were reviewed and are negative.  Physical Exam: BP 100/60  Ht 5\' 1"  (1.549 m)  Wt 177 lb 12.8 oz (80.65 kg)  BMI 33.61 kg/m2 Patient is very pleasant and in no acute distress. Skin is warm and dry. Color is normal.  HEENT is unremarkable. Normocephalic/atraumatic. PERRL. Sclera are  nonicteric. Neck is supple. No masses. No JVD. Lungs are clear. Cardiac exam shows a regular rate and rhythm. Abdomen is soft. Extremities are without edema. Gait and ROM are intact. No gross neurologic deficits noted.  Wt Readings from Last 3 Encounters:  11/28/13 177 lb 12.8 oz (80.65 kg)  11/16/13 173 lb (78.472 kg)  11/16/13 173 lb (78.472 kg)     LABORATORY DATA: EKG today shows sinus bradycardia   Lab Results  Component Value Date   WBC 6.0 11/05/2013   HGB 16.7* 11/16/2013   HCT 49.0* 11/16/2013   PLT 148.0* 11/05/2013   GLUCOSE 114* 11/05/2013   CHOL 169 12/20/2011   TRIG 108.0 12/20/2011   HDL 78.80 12/20/2011   LDLCALC 69 12/20/2011   ALT 18 12/20/2011   AST 26 12/20/2011   NA 139 11/16/2013   K 4.8 11/16/2013   CL 103 11/05/2013   CREATININE 1.4* 11/05/2013   BUN 26* 11/05/2013   CO2 23 11/05/2013   TSH 1.875 09/08/2013   INR 2.1 11/15/2013   Procedure Details  The patient was NPO after midnight. Anesthesia was administered at the beside by Dr.Fitzgerald with 80mg  of propofol and 40mg  of Lidocaine. Cardioversion was done with synchronized biphasic defibrillation with AP pads with 150watts. The patient converted to normal sinus bradycardia. The patient tolerated the procedure well  IMPRESSION:  Successful cardioversion of atrial fibrillation  Patient's HR in the mid to upper 40's post DCCV. Will hold Coreg and Cardizem until seen by Truitt Merle on Tuesday 2/25. Continue amio at 200mg  BID  [name],[name]  11/16/2013, 12:25 PM  Assessment / Plan:  1. Atrial fib - on amiodarone - now s/p cardioversion - holding in sinus. Have talked with Dr. Rayann Heman - will cut the amiodarone back to just once a day.   2. LV Dysfunction - looks compensated - would like to repeat her echo in about 2 1/2 months - would hope that we would see improvement with restoration of sinus rhythm.   3. Chronic anticoagulation - no problems noted.  I have asked her to let them know that her amiodarone has been cut  back  4. Satisfactory recent Myoview without ischemia  5. Preop clearance for eye surgery - discussed with Dr. Rayann Heman - ok to proceed with her eye surgery 30 days post cardioversion and ok to hold her coumadin for 5 days prior.    Patient is agreeable to this plan and will call if any problems develop in the interim.  Burtis Junes, RN, [facility]  [address] [place]  [place], [place] [address]  [phone]

## 2013-11-29 ENCOUNTER — Ambulatory Visit (INDEPENDENT_AMBULATORY_CARE_PROVIDER_SITE_OTHER): Payer: Medicare Other | Admitting: Family

## 2013-11-29 DIAGNOSIS — M6281 Muscle weakness (generalized): Secondary | ICD-10-CM | POA: Diagnosis not present

## 2013-11-29 DIAGNOSIS — I4891 Unspecified atrial fibrillation: Secondary | ICD-10-CM

## 2013-11-29 DIAGNOSIS — Z5181 Encounter for therapeutic drug level monitoring: Secondary | ICD-10-CM | POA: Diagnosis not present

## 2013-11-29 DIAGNOSIS — M25519 Pain in unspecified shoulder: Secondary | ICD-10-CM | POA: Diagnosis not present

## 2013-11-29 LAB — POCT INR: INR: 3.5

## 2013-11-29 NOTE — Patient Instructions (Signed)
Hold Coumadin today. Then continue 1 tablet daily except 2 tablets on Sunday/Wednesday. Re-check in 10 days.  Anticoagulation Dose Instructions as of 11/29/2013     Dorene Grebe Tue Wed Thu Fri Sat   New Dose 5 mg 2.5 mg 2.5 mg 5 mg 2.5 mg 2.5 mg 2.5 mg    Description       Hold Coumadin today. Then continue 1 tablet daily except 2 tablets on Sunday/Wednesday. Re-check in 10 days.

## 2013-11-30 ENCOUNTER — Other Ambulatory Visit (INDEPENDENT_AMBULATORY_CARE_PROVIDER_SITE_OTHER): Payer: Medicare Other

## 2013-11-30 ENCOUNTER — Ambulatory Visit (INDEPENDENT_AMBULATORY_CARE_PROVIDER_SITE_OTHER): Payer: Medicare Other | Admitting: Pulmonary Disease

## 2013-11-30 ENCOUNTER — Encounter: Payer: Self-pay | Admitting: Pulmonary Disease

## 2013-11-30 VITALS — BP 124/76 | HR 52 | Temp 97.4°F | Ht 61.0 in | Wt 179.4 lb

## 2013-11-30 DIAGNOSIS — G2581 Restless legs syndrome: Secondary | ICD-10-CM

## 2013-11-30 DIAGNOSIS — D649 Anemia, unspecified: Secondary | ICD-10-CM | POA: Diagnosis not present

## 2013-11-30 DIAGNOSIS — R911 Solitary pulmonary nodule: Secondary | ICD-10-CM

## 2013-11-30 DIAGNOSIS — G4733 Obstructive sleep apnea (adult) (pediatric): Secondary | ICD-10-CM

## 2013-11-30 DIAGNOSIS — I4891 Unspecified atrial fibrillation: Secondary | ICD-10-CM

## 2013-11-30 LAB — IRON AND TIBC
%SAT: 29 % (ref 20–55)
Iron: 106 ug/dL (ref 42–145)
TIBC: 363 ug/dL (ref 250–470)
UIBC: 257 ug/dL (ref 125–400)

## 2013-11-30 LAB — IRON: IRON: 103 ug/dL (ref 42–145)

## 2013-11-30 NOTE — Patient Instructions (Signed)
You have small nodule in right upper lung Repeat CT scan in June OK to stop dulera Check iron levels for restless legs

## 2013-11-30 NOTE — Progress Notes (Signed)
   Subjective:    Patient ID: Angel Meyer, female    DOB: 1932-10-05, 80 y.o.   MRN: [id number]  HPI  80 yo WF with mild sleep apnea who is not on cpap, yearly cough that worsens with seasonal changes, long history of secondhand tobacco admitted to hospital 09/08/13 for Atrial fib -RVR . Seen by PCCM for consult for cough /bronchits and consideration for Amiodarone therapy.   11/30/13 She was  hospitalized for Atrial Fib w/ RVR 09/08/13 . PCCM consulted for cough and dyspnea.   She has refractory Afib and has been on amiodarone in past but was stopped due to cough. Was started on Advair PTA for cough /RAD . Says she has been tx for chronic cough w/ abx, steroids and narcotic cough suppressants. PCCM asked to give opinion on the use of amiodarone for rate control. CXR suggested bibasl infx & RUL nodule . CT scan was done that did not show fibrosis .  PFT showed no airflow obstruction . She was approved for Amiodarone start, now on 200 mg daily  DLCO slightly was slightly diminshed at ~62%.  She was started on Dulera. Says since discharge she is feeling better. Cough is much better. Some lingering dry cough.  Sleep study from 2010 showed mild OSA (AHI 8.2/h) with severe RLS (on requip) . She does not like to wear cpap  Chief Complaint  Patient presents with  . Follow-up    Cough is much better. rarely has cough. No wheezing, chest tx. She wears her cpap about once a week. NOt able to toleraqte it.     Labs- Fe/ TIBC nml  Review of Systems neg for any significant sore throat, dysphagia, itching, sneezing, nasal congestion or excess/ purulent secretions, fever, chills, sweats, unintended wt loss, pleuritic or exertional cp, hempoptysis, orthopnea pnd or change in chronic leg swelling. Also denies presyncope, palpitations, heartburn, abdominal pain, nausea, vomiting, diarrhea or change in bowel or urinary habits, dysuria,hematuria, rash, arthralgias, visual complaints, headache, numbness weakness or  ataxia.      Objective:   Physical Exam  Gen. Pleasant, obese, in no distress ENT - no lesions, no post nasal drip Neck: No JVD, no thyromegaly, no carotid bruits Lungs: no use of accessory muscles, no dullness to percussion, decreased without rales or rhonchi  Cardiovascular: Rhythm regular, heart sounds  normal, no murmurs or gallops, no peripheral edema Musculoskeletal: No deformities, no cyanosis or clubbing , no tremors        Assessment & Plan:

## 2013-12-03 ENCOUNTER — Ambulatory Visit: Payer: Medicare Other | Admitting: Internal Medicine

## 2013-12-03 NOTE — Assessment & Plan Note (Signed)
repeat CT in June 2015

## 2013-12-03 NOTE — Assessment & Plan Note (Signed)
Doubt she will ever be fully compliant with cpap Benefits on Afibn have been discussed

## 2013-12-03 NOTE — Assessment & Plan Note (Signed)
Fe levels ok ct requip

## 2013-12-03 NOTE — Assessment & Plan Note (Signed)
Repeat DLCO in June 2015 priro to next visit

## 2013-12-05 ENCOUNTER — Encounter: Payer: Self-pay | Admitting: *Deleted

## 2013-12-06 DIAGNOSIS — M25519 Pain in unspecified shoulder: Secondary | ICD-10-CM | POA: Diagnosis not present

## 2013-12-06 DIAGNOSIS — M6281 Muscle weakness (generalized): Secondary | ICD-10-CM | POA: Diagnosis not present

## 2013-12-10 ENCOUNTER — Ambulatory Visit (INDEPENDENT_AMBULATORY_CARE_PROVIDER_SITE_OTHER): Payer: Medicare Other | Admitting: General Practice

## 2013-12-10 DIAGNOSIS — I4891 Unspecified atrial fibrillation: Secondary | ICD-10-CM | POA: Diagnosis not present

## 2013-12-10 DIAGNOSIS — Z5181 Encounter for therapeutic drug level monitoring: Secondary | ICD-10-CM

## 2013-12-10 LAB — POCT INR: INR: 2.8

## 2013-12-10 NOTE — Patient Instructions (Addendum)
Instructions for holding coumadin pre-procedure  3/18 - Take last dose of coumadin until after procedure.  3/24 and 3/25 - Take 7.5 mg of coumadin  3/26- resume current dosage.

## 2013-12-10 NOTE — Progress Notes (Signed)
Pre visit review using our clinic review tool, if applicable. No additional management support is needed unless otherwise documented below in the visit note. 

## 2013-12-17 ENCOUNTER — Other Ambulatory Visit: Payer: Self-pay

## 2013-12-17 DIAGNOSIS — H02009 Unspecified entropion of unspecified eye, unspecified eyelid: Secondary | ICD-10-CM | POA: Diagnosis not present

## 2013-12-17 DIAGNOSIS — H16219 Exposure keratoconjunctivitis, unspecified eye: Secondary | ICD-10-CM | POA: Diagnosis not present

## 2013-12-17 DIAGNOSIS — D485 Neoplasm of uncertain behavior of skin: Secondary | ICD-10-CM | POA: Diagnosis not present

## 2013-12-17 DIAGNOSIS — D2310 Other benign neoplasm of skin of unspecified eyelid, including canthus: Secondary | ICD-10-CM | POA: Diagnosis not present

## 2013-12-17 DIAGNOSIS — H02039 Senile entropion of unspecified eye, unspecified eyelid: Secondary | ICD-10-CM | POA: Diagnosis not present

## 2013-12-17 DIAGNOSIS — M2420 Disorder of ligament, unspecified site: Secondary | ICD-10-CM | POA: Diagnosis not present

## 2013-12-20 ENCOUNTER — Telehealth: Payer: Self-pay | Admitting: *Deleted

## 2013-12-20 ENCOUNTER — Emergency Department (HOSPITAL_COMMUNITY)
Admission: EM | Admit: 2013-12-20 | Discharge: 2013-12-20 | Disposition: A | Discharge status: Home / Self Care | Payer: Medicare Other | Attending: Emergency Medicine | Admitting: Emergency Medicine

## 2013-12-20 ENCOUNTER — Encounter (HOSPITAL_COMMUNITY): Payer: Self-pay | Admitting: Emergency Medicine

## 2013-12-20 ENCOUNTER — Other Ambulatory Visit: Payer: Self-pay

## 2013-12-20 ENCOUNTER — Emergency Department (HOSPITAL_COMMUNITY): Payer: Medicare Other

## 2013-12-20 DIAGNOSIS — Z7901 Long term (current) use of anticoagulants: Secondary | ICD-10-CM

## 2013-12-20 DIAGNOSIS — Z79899 Other long term (current) drug therapy: Secondary | ICD-10-CM | POA: Insufficient documentation

## 2013-12-20 DIAGNOSIS — I4891 Unspecified atrial fibrillation: Secondary | ICD-10-CM | POA: Diagnosis not present

## 2013-12-20 DIAGNOSIS — G4733 Obstructive sleep apnea (adult) (pediatric): Secondary | ICD-10-CM | POA: Diagnosis not present

## 2013-12-20 DIAGNOSIS — R0602 Shortness of breath: Secondary | ICD-10-CM | POA: Diagnosis not present

## 2013-12-20 DIAGNOSIS — R002 Palpitations: Secondary | ICD-10-CM | POA: Insufficient documentation

## 2013-12-20 DIAGNOSIS — I480 Paroxysmal atrial fibrillation: Secondary | ICD-10-CM

## 2013-12-20 DIAGNOSIS — Z87442 Personal history of urinary calculi: Secondary | ICD-10-CM | POA: Diagnosis not present

## 2013-12-20 DIAGNOSIS — I5020 Unspecified systolic (congestive) heart failure: Secondary | ICD-10-CM | POA: Insufficient documentation

## 2013-12-20 DIAGNOSIS — Z9981 Dependence on supplemental oxygen: Secondary | ICD-10-CM | POA: Insufficient documentation

## 2013-12-20 DIAGNOSIS — Z8739 Personal history of other diseases of the musculoskeletal system and connective tissue: Secondary | ICD-10-CM | POA: Insufficient documentation

## 2013-12-20 DIAGNOSIS — R072 Precordial pain: Secondary | ICD-10-CM | POA: Insufficient documentation

## 2013-12-20 DIAGNOSIS — R61 Generalized hyperhidrosis: Secondary | ICD-10-CM | POA: Insufficient documentation

## 2013-12-20 DIAGNOSIS — E785 Hyperlipidemia, unspecified: Secondary | ICD-10-CM | POA: Insufficient documentation

## 2013-12-20 DIAGNOSIS — I10 Essential (primary) hypertension: Secondary | ICD-10-CM | POA: Insufficient documentation

## 2013-12-20 LAB — CBC
HEMATOCRIT: 49.5 % — AB (ref 36.0–46.0)
HEMOGLOBIN: 17.4 g/dL — AB (ref 12.0–15.0)
MCH: 31.6 pg (ref 26.0–34.0)
MCHC: 35.2 g/dL (ref 30.0–36.0)
MCV: 89.8 fL (ref 78.0–100.0)
Platelets: 136 10*3/uL — ABNORMAL LOW (ref 150–400)
RBC: 5.51 MIL/uL — ABNORMAL HIGH (ref 3.87–5.11)
RDW: 16.5 % — ABNORMAL HIGH (ref 11.5–15.5)
WBC: 6.2 10*3/uL (ref 4.0–10.5)

## 2013-12-20 LAB — PROTIME-INR
INR: 1.13 (ref 0.00–1.49)
Prothrombin Time: 14.3 seconds (ref 11.6–15.2)

## 2013-12-20 LAB — BASIC METABOLIC PANEL
BUN: 23 mg/dL (ref 6–23)
CHLORIDE: 100 meq/L (ref 96–112)
CO2: 21 mEq/L (ref 19–32)
CREATININE: 1.04 mg/dL (ref 0.50–1.10)
Calcium: 10.1 mg/dL (ref 8.4–10.5)
GFR, EST AFRICAN AMERICAN: 58 mL/min — AB (ref >90)
GFR, EST NON AFRICAN AMERICAN: 50 mL/min — AB (ref >90)
Glucose, Bld: 98 mg/dL (ref 70–99)
Potassium: 4 mEq/L (ref 3.7–5.3)
Sodium: 140 mEq/L (ref 137–147)

## 2013-12-20 LAB — PRO B NATRIURETIC PEPTIDE: PRO B NATRI PEPTIDE: 1618 pg/mL — AB (ref 0–450)

## 2013-12-20 LAB — I-STAT TROPONIN, ED: Troponin i, poc: 0 ng/mL (ref 0.00–0.08)

## 2013-12-20 MED ORDER — DILTIAZEM HCL 25 MG/5ML IV SOLN: 10.0000 mg | Freq: Once | INTRAVENOUS | Status: DC

## 2013-12-20 MED ORDER — AMIODARONE HCL 200 MG PO TABS
200.0000 mg | ORAL_TABLET | Freq: Once | ORAL | Status: AC
  Administered 2013-12-20: 200 mg via ORAL
  Filled 2013-12-20: qty 1

## 2013-12-20 MED ORDER — DILTIAZEM HCL 30 MG PO TABS: ORAL_TABLET | ORAL | Status: DC

## 2013-12-20 NOTE — Consult Note (Signed)
ELECTROPHYSIOLOGY CONSULT NOTE   Patient ID: Angel Meyer MRN: [id number], DOB/AGE: September 13, 1933   Admit date: 12/20/2013 Date of Consult: 12/20/2013  Primary Physician: Carolann Littler, MD Primary Cardiologist: Thompson Grayer, MD Reason for Consultation: Atrial fibrillation  History of Present Illness Angel Meyer is a 80 y.o. 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 She is accompanied by her daughter who assists with history questions. Angel Meyer has been feeling like her usual self over the last several weeks. She was seen by Angel Merle, NP in follow-up on 11/28/2013 at which time she was in [place] and her amiodarone dose was down-titrated to once daily. She was doing well until this AM. She reports waking up around 8:30 AM, walking into the bathroom and while on the commode having a bowel movement became SOB with chest and abdominal pain. She also had diaphoresis. She denies palpitations or syncope. She denies LE swelling, orthopnea or PND. She took SL NTG x 1 with improvement in her pain and SOB. Her daughter called our office and she was instructed to bring her to the ED for evaluation. She missed all of her medications on Friday but has otherwise been compliant. She had recent right eye surgery to correct an entropion otherwise she denies any recent changes to her health, recent illness, fever or chills. She denies history of CAD/MI. Recent Myoview Feb 2015 was negative for ischemia. On presentation to the ED she was in rapid atrial fibrillation. She has spontaneously converted and is in SR now. She is feeling better, no pain or SOB. Troponin negative. ProBNP minimally elevated at 1618. Chest x-ray shows lungs are well aerated no acute abnormality. Her INR is subtherapeutic.  Past Medical History Past Medical History  Diagnosis Date  . Arthritis   . Persistent atrial fibrillation      failed on Flecainide and Tikosyn  . Renal calculi   . Hypertension   . Hyperlipidemia   . Rheumatic fever     age 37  . History of blood transfusion   . Incontinence of urine   . Diastolic dysfunction     preserved EF,  CHF In setting of afib previously  . Osteoporosis   . OSA (obstructive sleep apnea)     uses CPAP  . Restless legs   . Systolic heart failure     EF 30 to 35% per echo 08/2013  . Dysrhythmia     afib    Past Surgical History Past Surgical History  Procedure Laterality Date  . Breast surgery      benign biopsy  . Tonsillectomy    . Abdominal hysterectomy    . Spine surgery      laminectomy 1965, spinal fusion with rod 2005  . Replacement total knee  2005  . Kidney stone surgery      1997 removed  . Cardioversion N/A 09/10/2013    Procedure: CARDIOVERSION;  Surgeon: Thompson Grayer, MD;  Location: [facility];  Service: Cardiovascular;  Laterality: N/A;  . Cardioversion N/A 11/16/2013    Procedure: CARDIOVERSION;  Surgeon: Sueanne Margarita, MD;  Location: [facility];  Service: Cardiovascular;  Laterality: N/A;    Allergies/Intolerances Allergies  Allergen Reactions  . Ciprofloxacin Nausea And Vomiting  . Demerol [Meperidine] Nausea And Vomiting  . Sulfa Antibiotics Nausea And Vomiting    Current Home Medications      acetaminophen 650 MG CR tablet  Commonly known as:  TYLENOL  Take 650 mg by mouth every 8 (eight) hours as needed for pain.     amiodarone 200 MG tablet  Commonly known as:  PACERONE  Take 1 tablet (200 mg total) by mouth daily.     docusate sodium 100 MG capsule  Commonly known as:  COLACE  Take 100 mg by mouth daily as needed for mild constipation.     escitalopram 10 MG tablet  Commonly known as:  LEXAPRO  Take 1 tablet (10 mg total) by mouth daily.     Fish Oil 1200 MG Caps  Take 1,200 mg by mouth 2 (two) times daily.     furosemide 40 MG tablet  Commonly known as:  LASIX  Take 0.5 tablets (20 mg total) by mouth daily.      MAXITROL 0.1 % Oint  Generic drug:  neomycin-polymyxin-dexameth  Place 1 application into the right eye 3 (three) times daily.     nitroGLYCERIN 0.4 MG SL tablet  Commonly known as:  NITROSTAT  Place 1 tablet (0.4 mg total) under the tongue every 5 (five) minutes as needed for chest pain.     oxybutynin 10 MG 24 hr tablet  Commonly known as:  DITROPAN-XL  Take 1 tablet (10 mg total) by mouth daily.     potassium chloride SA 20 MEQ tablet  Commonly known as:  K-DUR,KLOR-CON  Take 1 tablet (20 mEq total) by mouth daily.     rOPINIRole 1 MG tablet  Commonly known as:  REQUIP  Take 1 tablet (1 mg total) by mouth 2 (two) times daily.     rosuvastatin 40 MG tablet  Commonly known as:  CRESTOR  Take 1 tablet (40 mg total) by mouth daily.     spironolactone 25 MG tablet  Commonly known as:  ALDACTONE  Take 1 tablet (25 mg total) by mouth daily.     Vitamin D3 2000 UNITS Tabs  Take 2,000 Units by mouth daily. Once a day     warfarin 2.5 MG tablet  Commonly known as:  COUMADIN  Take 2.5-5 mg by mouth See admin instructions. Take 2.5 mg daily, except for Wednesday and Sunday take 5 mg.     Family History Family History  Problem Relation Age of Onset  . Cancer Mother     breast  . Diabetes Mother   . Cancer Paternal Grandfather      Social History History   Social History  . Marital Status: Divorced    Spouse Name: N/A    Number of Children: N/A  . Years of Education: N/A   Occupational History  . Not on file.   Social History Main Topics  . Smoking status: Never Smoker   . Smokeless tobacco: Not on file  . Alcohol Use: Yes     Comment: glass of wine each day  . Drug Use: No  . Sexual Activity: Not Currently   Other Topics Concern  . Not on file   Social History Narrative   Lives in [place] alone.  Spends 4-6 months per year in [place] with her daughter.   Retired Network engineer     Review of [place]: No chills, fever, night sweats or weight changes   Cardiovascular:  No chest pain, dyspnea on exertion, edema, orthopnea, palpitations, paroxysmal nocturnal dyspnea Dermatological: No rash, lesions or masses Respiratory: No cough, dyspnea Urologic: No hematuria, dysuria Abdominal: No nausea, vomiting, diarrhea, bright red blood per rectum, melena, or hematemesis Neurologic: No visual  changes, weakness, changes in mental status All other systems reviewed and are otherwise negative except as noted above.  Physical Exam Vitals: Blood pressure 127/71, pulse 72, temperature 97.8 F (36.6 C), temperature source Oral, resp. rate 15, height 5\' 1"  (1.549 m), weight 178 lb (80.74 kg), SpO2 97.00%.  General: Well developed, well appearing 80 y.o. female in no acute distress. HEENT: Normocephalic, atraumatic. EOMs intact. Sclera nonicteric. Oropharynx clear.  Neck: Supple. No JVD. Lungs: Respirations regular and unlabored, CTA bilaterally. No wheezes, rales or rhonchi. Heart: RRR. S1, S2 present. No murmurs, rub, S3 or S4. Abdomen: Soft, non-tender, non-distended. BS present x 4 quadrants.  Extremities: No clubbing, cyanosis or edema. DP/PT/Radials 2+ and equal bilaterally. Psych: Normal affect. Neuro: Alert and oriented X 3. Moves all extremities spontaneously. Musculoskeletal: No kyphosis. Skin: Intact. Warm and dry. No rashes or petechiae in exposed areas.   Labs Lab Results  Component Value Date   WBC 6.2 12/20/2013   HGB 17.4* 12/20/2013   HCT 49.5* 12/20/2013   MCV 89.8 12/20/2013   PLT 136* 12/20/2013    Recent Labs Lab 12/20/13 1055  NA 140  K 4.0  CL 100  CO2 21  BUN 23  CREATININE 1.04  CALCIUM 10.1  GLUCOSE 98    Recent Labs  12/20/13 1055  INR 1.13    Radiology/Studies Dg Chest 2 View  12/20/2013   CLINICAL DATA:  Shortness of breath  EXAM: CHEST  2 VIEW  COMPARISON:  09/12/2013  FINDINGS: Cardiac shadow is stable. The lungs are well aerated bilaterally. Postsurgical changes are noted at the thoracolumbar junction.  Postsurgical changes in the left right shoulder are noted as well. No acute bony abnormality is seen.  IMPRESSION: No acute abnormality noted.   Electronically Signed   By: Inez Catalina M.D.   On: 12/20/2013 11:13   Echocardiogram Dec 2014 Study Conclusions - Left ventricle: The cavity size was normal. Wall thickness was normal. Systolic function was moderately to severely reduced. The estimated ejection fraction was in the range of 30% to 35%. Diffuse hypokinesis. - Left atrium: The atrium was moderately dilated. - Right atrium: The atrium was moderately dilated. - Pulmonary arteries: PA peak pressure: 62mm Hg (S).  12-lead ECG on presentation at 10:26 AM - atrial fibrillation w/RVR at 111 bpm Repeat 12-lead ECG at 12:29 PM - SR Telemetry reviewed - SR currently  Assessment and Plan Paroxysmal atrial fibrillation Presumed tachy-mediated CM, EF 30-35% Subtherapeutic INR (but off Coumadin recently for eye surgery and restarted on Tuesday 12/18/2012)  Ms. Baire presents with symptomatic atrial fibrillation. She is now back in SR. She does not appear volume overloaded on exam and her CXR is clear. ProBNP only minimally elevated. At this time she is stable for discharge home to follow-up in the office as scheduled with Angel Merle, NP in 3 weeks. She will continue her current medication regimen, including amiodarone 200 mg once daily. She does not need any AV nodal agents on a daily basis. Dr. Rayann Heman gave instructions for management of atrial fibrillation in the outpatient setting as long as she is stable. If she feels she is in afib and pulse rate >100 bpm, she was instructed to take ONE additional dose of amiodarone (200 mg for one dose only). Also, she was instructed to take short acting diltiazem 30 mg every 6 hours as needed for pulse >100 bpm when in afib. Ms. Demo and her daughter expressed verbal understanding and agree with this plan of care.   Signed,  Ileene Hutchinson,  PA-C 12/20/2013, 2:23 PM   I have seen, examined the patient, and reviewed the above assessment and plan.  Changes to above are made where necessary.   The patient is well known to me.  Her afib has been rather difficult to treat.  She has done better recently with amiodarone therapy.  She developed abrupt onset of afib earlier today.  She has converted back to sinus and now appears to be back to her baseline.  I think that we should resume her prior regimen.  IF she has further afib, she can take additional amiodarone as well as cardizem for rate control.  (Given prior baseline sinus bradycardia I will not start daily rate control but will use cardizem as needed).  OK to discharge with outpatient followup.  Co Sign: Thompson Grayer, MD 12/20/2013 10:54 PM

## 2013-12-20 NOTE — ED Notes (Signed)
Pt began to have SOB, diaphoresis and feeling fatigued this am. She has hx chf and atrial fib. She took 2 of her nitro pills which helped her sob. She called her doctor and they told her to come to the er. She initially felt 'some discomfort' in her chest which is gone

## 2013-12-20 NOTE — ED Notes (Signed)
Cardiology at bedside.

## 2013-12-20 NOTE — ED Notes (Signed)
Second RN, Gillermo Murdoch made 2 IV attempts. Unsuccessful. Dr. Darl Householder notified.

## 2013-12-20 NOTE — ED Notes (Signed)
Two IV attempts made, unsuccessful. Second RN notified.

## 2013-12-20 NOTE — Discharge Instructions (Addendum)
° °  INSTRUCTIONS FOR OUTPATIENT MANAGEMENT OF ATRIAL FIBRILLATION per Dr. Rayann Heman -   If you go back into atrial fibrillation, remember to relax.   If your pulse rate is >100 bpm,                    1)  You may take ONE additional dose of amiodarone (200 mg = 1 tablet, for one dose only).                    2)  Also, take diltiazem 30 mg (1 tablet) once. You may take diltiazem 30 mg (1 tablet) every 6 hours as                         needed for pulse rate >100 bpm. Once you return to normal rhythm, you will no longer need to take                         diltiazem.

## 2013-12-20 NOTE — ED Provider Notes (Signed)
CSN: [id number]     Arrival date & time 12/20/13  1018 History   First MD Initiated Contact with Patient 12/20/13 1105     Chief Complaint  Patient presents with  . Shortness of Breath     (Consider location/radiation/quality/duration/timing/severity/associated sxs/prior Treatment) The history is provided by the patient.  Angel Meyer is a 80 y.o. female hx of afib, HTN, HL here with chest pain, palpitations. She woke up this morning with substernal chest pain. It was sense of pressure. She also has some shortness of breath and diaphoresis associated with it. She also noted some palpitations as well and was noted to be back in A. Fib. She came by EMS and was given 2 nitroglycerin and chest pain is now gone. She is currently taking amiodarone and Coumadin for A. Fib.    Past Medical History  Diagnosis Date  . Arthritis   . Persistent atrial fibrillation     failed on Flecainide and Tikosyn  . Renal calculi   . Hypertension   . Hyperlipidemia   . Rheumatic fever     age 75  . History of blood transfusion   . Incontinence of urine   . Diastolic dysfunction     preserved EF,  CHF In setting of afib previously  . Osteoporosis   . OSA (obstructive sleep apnea)     uses CPAP  . Restless legs   . Systolic heart failure     EF 30 to 35% per echo 08/2013  . Dysrhythmia     afib   Past Surgical History  Procedure Laterality Date  . Breast surgery      benign biopsy  . Tonsillectomy    . Abdominal hysterectomy    . Spine surgery      laminectomy 1965, spinal fusion with rod 2005  . Replacement total knee  2005  . Kidney stone surgery      1997 removed  . Cardioversion N/A 09/10/2013    Procedure: CARDIOVERSION;  Surgeon: Thompson Grayer, MD;  Location: [facility] OR;  Service: Cardiovascular;  Laterality: N/A;  . Cardioversion N/A 11/16/2013    Procedure: CARDIOVERSION;  Surgeon: Sueanne Margarita, MD;  Location: [facility]COPY;  Service: Cardiovascular;  Laterality: N/A;   Family History   Problem Relation Age of Onset  . Cancer Mother     breast  . Diabetes Mother   . Cancer Paternal Grandfather    History  Substance Use Topics  . Smoking status: Never Smoker   . Smokeless tobacco: Not on file  . Alcohol Use: Yes     Comment: glass of wine each day   OB History   Grav Para Term Preterm Abortions TAB SAB Ect Mult Living                 Review of Systems  Respiratory: Positive for shortness of breath.   Cardiovascular: Positive for chest pain and palpitations.  All other systems reviewed and are negative.      Allergies  Ciprofloxacin; Demerol; and Sulfa antibiotics  Home Medications   Current Outpatient Rx  Name  Route  Sig  Dispense  Refill  . acetaminophen (TYLENOL) 650 MG CR tablet   Oral   Take 650 mg by mouth every 8 (eight) hours as needed for pain.          Marland Kitchen amiodarone (PACERONE) 200 MG tablet   Oral   Take 1 tablet (200 mg total) by mouth daily.   60 tablet   3   .  Cholecalciferol (VITAMIN D3) 2000 UNITS TABS   Oral   Take 2,000 Units by mouth daily. Once a day         . docusate sodium (COLACE) 100 MG capsule   Oral   Take 100 mg by mouth daily as needed for mild constipation.         Marland Kitchen escitalopram (LEXAPRO) 10 MG tablet   Oral   Take 1 tablet (10 mg total) by mouth daily.   90 tablet   3   . furosemide (LASIX) 40 MG tablet   Oral   Take 0.5 tablets (20 mg total) by mouth daily.   30 tablet   4   . neomycin-polymyxin-dexameth (MAXITROL) 0.1 % OINT   Right Eye   Place 1 application into the right eye 3 (three) times daily.         . nitroGLYCERIN (NITROSTAT) 0.4 MG SL tablet   Sublingual   Place 1 tablet (0.4 mg total) under the tongue every 5 (five) minutes as needed for chest pain.   25 tablet   3   . Omega-3 Fatty Acids (FISH OIL) 1200 MG CAPS   Oral   Take 1,200 mg by mouth 2 (two) times daily.          Marland Kitchen oxybutynin (DITROPAN-XL) 10 MG 24 hr tablet   Oral   Take 1 tablet (10 mg total) by mouth  daily.   90 tablet   3   . potassium chloride SA (K-DUR,KLOR-CON) 20 MEQ tablet   Oral   Take 1 tablet (20 mEq total) by mouth daily.   30 tablet   4   . rOPINIRole (REQUIP) 1 MG tablet   Oral   Take 1 tablet (1 mg total) by mouth 2 (two) times daily.   180 tablet   3   . rosuvastatin (CRESTOR) 40 MG tablet   Oral   Take 1 tablet (40 mg total) by mouth daily.   90 tablet   3   . spironolactone (ALDACTONE) 25 MG tablet   Oral   Take 1 tablet (25 mg total) by mouth daily.   30 tablet   3   . warfarin (COUMADIN) 2.5 MG tablet   Oral   Take 2.5-5 mg by mouth See admin instructions. Take 2.5 mg daily, except for Wednesday and Sunday take 5 mg.          BP 120/70  Pulse 69  Temp(Src) 97.8 F (36.6 C) (Oral)  Resp 12  Ht 5\' 1"  (1.549 m)  Wt 178 lb (80.74 kg)  BMI 33.65 kg/m2  SpO2 97% Physical Exam  Nursing note and vitals reviewed. Constitutional: She is oriented to person, place, and time.  Chronically ill, NAD   HENT:  Head: Normocephalic.  Mouth/Throat: Oropharynx is clear and moist.  Eyes: Conjunctivae are normal. Pupils are equal, round, and reactive to light.  Neck: Normal range of motion. Neck supple.  Cardiovascular: Normal heart sounds.   Tachy in 120s, irregular   Pulmonary/Chest: Effort normal and breath sounds normal. No respiratory distress. She has no wheezes. She has no rales.  Abdominal: Soft. Bowel sounds are normal. She exhibits no distension. There is no tenderness. There is no rebound and no guarding.  Musculoskeletal: Normal range of motion.  1+ edema bilaterally   Neurological: She is alert and oriented to person, place, and time. No cranial nerve deficit. Coordination normal.  Skin: Skin is warm and dry.  Psychiatric: She has a normal mood and affect. Her  behavior is normal. Judgment and thought content normal.    ED Course  Procedures (including critical care time) Labs Review Labs Reviewed  CBC - Abnormal; Notable for the  following:    RBC 5.51 (*)    Hemoglobin 17.4 (*)    HCT 49.5 (*)    RDW 16.5 (*)    Platelets 136 (*)    All other components within normal limits  BASIC METABOLIC PANEL - Abnormal; Notable for the following:    GFR calc non Af Amer 50 (*)    GFR calc Af Amer 58 (*)    All other components within normal limits  PRO B NATRIURETIC PEPTIDE - Abnormal; Notable for the following:    Pro B Natriuretic peptide (BNP) 1618.0 (*)    All other components within normal limits  PROTIME-INR  Randolm Idol, ED   Imaging Review Dg Chest 2 View  12/20/2013   CLINICAL DATA:  Shortness of breath  EXAM: CHEST  2 VIEW  COMPARISON:  09/12/2013  FINDINGS: Cardiac shadow is stable. The lungs are well aerated bilaterally. Postsurgical changes are noted at the thoracolumbar junction. Postsurgical changes in the left right shoulder are noted as well. No acute bony abnormality is seen.  IMPRESSION: No acute abnormality noted.   Electronically Signed   By: Inez Catalina M.D.   On: 12/20/2013 11:13     EKG Interpretation   Date/Time:  Thursday December 20 2013 10:26:37 EDT Ventricular Rate:  111 PR Interval:    QRS Duration: 78 QT Interval:  352 QTC Calculation: 478 R Axis:   -10 Text Interpretation:  Atrial fibrillation with rapid ventricular response  Anteroseptal infarct , age undetermined Abnormal ECG Since last tracing  rate faster Confirmed by Lawrance Wiedemann  MD, Ruqaya Strauss ([id number]) on 12/20/2013 11:10:14 AM      MDM   Final diagnoses:  None   Vernette Moise is a 80 y.o. female here with SOB, rapid afib. Will slow down HR with cardizem. Will call cards for evaluation.   4:01 PM Patient converted spontaneously with amiodarone. Cardiology evaluated. Will add cardizem prn for rate control. She has f/u with cardiology.    Wandra Arthurs, MD 12/20/13 [phone]

## 2013-12-20 NOTE — Telephone Encounter (Signed)
Pt's dtr calls in stating this morning her mother c/o SOB, sweating, tired. Denies CP, but took 2 nitro. HR is slow and irregular, but cannot get specific pulse number. Has gained a few pounds in last week, and 6pds since ablation. Pt's dtr states that she found her mother on the commode with these complaints this morning. Pt was bradycardic at office visit earlier this month with Truitt Merle - amiodarone decreased, and SOB/fatigue also noted.  Pt's dtr expressed that mom doesn't feel as good as she has been, and SOB seems to have worsened. Advised to go to ED for further follow up. They are agreeable to plan.

## 2013-12-27 ENCOUNTER — Ambulatory Visit (INDEPENDENT_AMBULATORY_CARE_PROVIDER_SITE_OTHER): Payer: Medicare Other | Admitting: General Practice

## 2013-12-27 ENCOUNTER — Other Ambulatory Visit: Payer: Self-pay | Admitting: General Practice

## 2013-12-27 DIAGNOSIS — I4891 Unspecified atrial fibrillation: Secondary | ICD-10-CM

## 2013-12-27 DIAGNOSIS — Z5181 Encounter for therapeutic drug level monitoring: Secondary | ICD-10-CM | POA: Diagnosis not present

## 2013-12-27 LAB — POCT INR: INR: 2.8

## 2013-12-27 MED ORDER — WARFARIN SODIUM 2.5 MG PO TABS: ORAL_TABLET | ORAL | Status: DC

## 2013-12-27 NOTE — Progress Notes (Signed)
Pre visit review using our clinic review tool, if applicable. No additional management support is needed unless otherwise documented below in the visit note. 

## 2014-01-10 ENCOUNTER — Encounter: Payer: Self-pay | Admitting: Nurse Practitioner

## 2014-01-10 ENCOUNTER — Ambulatory Visit (INDEPENDENT_AMBULATORY_CARE_PROVIDER_SITE_OTHER): Payer: Medicare Other | Admitting: Nurse Practitioner

## 2014-01-10 VITALS — BP 120/70 | HR 55 | Ht 61.0 in | Wt 179.8 lb

## 2014-01-10 DIAGNOSIS — E785 Hyperlipidemia, unspecified: Secondary | ICD-10-CM

## 2014-01-10 DIAGNOSIS — I4891 Unspecified atrial fibrillation: Secondary | ICD-10-CM

## 2014-01-10 MED ORDER — ROSUVASTATIN CALCIUM 40 MG PO TABS: 40.0000 mg | ORAL_TABLET | Freq: Every day | ORAL | Status: DC

## 2014-01-10 NOTE — Patient Instructions (Signed)
Lets get an echocardiogram before May 10th  Fasting labs on that day as well  I have refilled the Crestor  Call us when you get back in town and we will then arrange for a visit.  Call the [facility] office at [phone] if you have any questions, problems or concerns.

## 2014-01-10 NOTE — Progress Notes (Signed)
Angel Meyer Date of Birth: 08/28/1933 Medical Record [id number]  History of Present Illness: Angel Meyer is seen back today for a 6 week visit. Seen for Dr. Rayann Heman. She has PAF, rheumatic fever at age 80, HTN and OSA.   In December she presented with progressive dyspnea, orthopnea and cough for 6 to 8 weeks - noted to be in AF with RVR and volume overload. She was treated with flecainide which was unsuccessful. Then placed on Tikosyn and was cardioverted - this also unsuccessful. Now managed with rate control and anticoagulation. Seen by PCCM for her cough/congestion and was to discuss possible amiodarone therapy which was subsequently started. Echo showed her EF down to 30 to 35%. We proceeded on with cardioversion which was successful. She was bradycardic following the procedure and her Coreg and Diltiazem were stopped.  I was able to review some of her records from [place] received on 11/06/13 from Dr. Kerry Dory from [place] - I can see where she was put on amiodarone back in 2012 - then her dose was cut back and then stopped in May of 2013 along with discontinuation of her Coreg - not clear to me as to why. Looks like there was some shortness of breath and fatigue noted and low HR. Her diagnosis was always listed as "paroxysmal AF" and not chronic. Fatigue and DOE seemed to be a prevalent complaint.   I saw her back in March of 2015. She was felt to be doing well. Amiodarone was cut back to her maintenance dose. Really had had no improvement in her symptoms with restoration of sinus rhythm.   Since she was last here, she has seen pulmonary - for repeat scan in June for follow up of her lung nodule. She has also been to the ER at the end of March with chest pressure and noted to be in atrial fib with RVR - seen by Dr. Rayann Heman as well with recommendations as follows - Angel Meyer presents with symptomatic atrial fibrillation. She is now back in SR. She does not appear volume overloaded on exam and  her CXR is clear. ProBNP only minimally elevated. At this time she is stable for discharge home to follow-up in the office as scheduled with Truitt Merle, NP in 3 weeks. She will continue her current medication regimen, including amiodarone 200 mg once daily. She does not need any AV nodal agents on a daily basis. Dr. Rayann Heman gave instructions for management of atrial fibrillation in the outpatient setting as long as she is stable. If she feels she is in afib and pulse rate >100 bpm, she was instructed to take ONE additional dose of amiodarone (200 mg for one dose only). Also, she was instructed to take short acting diltiazem 30 mg every 6 hours as needed for pulse >100 bpm when in afib. Ms. Panzera and her daughter expressed verbal understanding and agree with this plan of care.  Signed,  Ileene Hutchinson, PA-C  12/20/2013, 2:23 PM   I have seen, examined the patient, and reviewed the above assessment and plan. Changes to above are made where necessary. The patient is well known to me. Her afib has been rather difficult to treat. She has done better recently with amiodarone therapy. She developed abrupt onset of afib earlier today. She has converted back to sinus and now appears to be back to her baseline. I think that we should resume her prior regimen. IF she has further afib, she can take additional amiodarone as well  as cardizem for rate control. (Given prior baseline sinus bradycardia I will not start daily rate control but will use cardizem as needed). OK to discharge with outpatient followup.  Co Sign: Thompson Grayer, MD  12/20/2013  10:54 PM  Comes back today. Here with her daughter. She has done well since her ER visit. No more atrial fib that she is aware of. Walking and even riding a stationary bike some. No prior chest pain or dyspnea. Notes that today she did not feel well today. Had a pain in her belly that radiated to her right chest and right side of head. She felt lightheaded and dizzy. This is  now totally resolved. No fever or chills. Appetite has been good. No constipation or diarrhea. She feels totally fine now. Has ran out of her Crestor and needs a refill. Daughter voiced concern about statin and diabetes. [name]'s mother was diabetic. Last glucose was normal. They note that Angel Meyer will be going back to [place] on May 36UY - not certain as to a return date. Does have cardiology care there - will get her coumadin checked as needed.   Current Outpatient Prescriptions  Medication Sig Dispense Refill  . acetaminophen (TYLENOL) 650 MG CR tablet Take 650 mg by mouth every 8 (eight) hours as needed for pain.       Marland Kitchen amiodarone (PACERONE) 200 MG tablet Take 1 tablet (200 mg total) by mouth daily.  60 tablet  3  . Cholecalciferol (VITAMIN D3) 2000 UNITS TABS Take 2,000 Units by mouth daily. Once a day      . diltiazem (CARDIZEM) 30 MG tablet Take only as needed. AFib w/ pulse >100 bpm, take 30 mg (1 tab) by mouth once. May repeat 30 mg (1 tab) every 6 hours PRN for rate >100 bpm.  30 tablet  3  . docusate sodium (COLACE) 100 MG capsule Take 100 mg by mouth daily as needed for mild constipation.      Marland Kitchen escitalopram (LEXAPRO) 10 MG tablet Take 1 tablet (10 mg total) by mouth daily.  90 tablet  3  . furosemide (LASIX) 40 MG tablet Take 0.5 tablets (20 mg total) by mouth daily.  30 tablet  4  . nitroGLYCERIN (NITROSTAT) 0.4 MG SL tablet Place 1 tablet (0.4 mg total) under the tongue every 5 (five) minutes as needed for chest pain.  25 tablet  3  . Omega-3 Fatty Acids (FISH OIL) 1200 MG CAPS Take 1,200 mg by mouth 2 (two) times daily.       Marland Kitchen oxybutynin (DITROPAN-XL) 10 MG 24 hr tablet Take 1 tablet (10 mg total) by mouth daily.  90 tablet  3  . potassium chloride SA (K-DUR,KLOR-CON) 20 MEQ tablet Take 1 tablet (20 mEq total) by mouth daily.  30 tablet  4  . rOPINIRole (REQUIP) 1 MG tablet Take 1 tablet (1 mg total) by mouth 2 (two) times daily.  180 tablet  3  . rosuvastatin (CRESTOR) 40 MG tablet  Take 1 tablet (40 mg total) by mouth daily.  90 tablet  3  . spironolactone (ALDACTONE) 25 MG tablet Take 1 tablet (25 mg total) by mouth daily.  30 tablet  3  . warfarin (COUMADIN) 2.5 MG tablet Take as directed by anticoagulation clinic  120 tablet  1  . [DISCONTINUED] oxybutynin (DITROPAN-XL) 10 MG 24 hr tablet Take 10 mg by mouth daily.       No current facility-administered medications for this visit.   Facility-Administered Medications Ordered in Other  Visits  Medication Dose Route Frequency Provider Last Rate Last Dose  . propofol (DIPRIVAN) 10 mg/mL bolus/IV push    Anesthesia Intra-op Scheryl Darter, CRNA   30 mg at 11/16/13 1232    Allergies  Allergen Reactions  . Ciprofloxacin Nausea And Vomiting  . Demerol [Meperidine] Nausea And Vomiting  . Sulfa Antibiotics Nausea And Vomiting    Past Medical History  Diagnosis Date  . Arthritis   . Persistent atrial fibrillation     failed on Flecainide and Tikosyn  . Renal calculi   . Hypertension   . Hyperlipidemia   . Rheumatic fever     age 15  . History of blood transfusion   . Incontinence of urine   . Diastolic dysfunction     preserved EF,  CHF In setting of afib previously  . Osteoporosis   . OSA (obstructive sleep apnea)     uses CPAP  . Restless legs   . Systolic heart failure     EF 30 to 35% per echo 08/2013  . Dysrhythmia     afib    Past Surgical History  Procedure Laterality Date  . Breast surgery      benign biopsy  . Tonsillectomy    . Abdominal hysterectomy    . Spine surgery      laminectomy 1965, spinal fusion with rod 2005  . Replacement total knee  2005  . Kidney stone surgery      1997 removed  . Cardioversion N/A 09/10/2013    Procedure: CARDIOVERSION;  Surgeon: Thompson Grayer, MD;  Location: [facility];  Service: Cardiovascular;  Laterality: N/A;  . Cardioversion N/A 11/16/2013    Procedure: CARDIOVERSION;  Surgeon: Sueanne Margarita, MD;  Location: [facility];  Service: Cardiovascular;   Laterality: N/A;    History  Smoking status  . Never Smoker   Smokeless tobacco  . Not on file    History  Alcohol Use  . Yes    Comment: glass of wine each day    Family History  Problem Relation Age of Onset  . Cancer Mother     breast  . Diabetes Mother   . Cancer Paternal Grandfather     Review of Systems: The review of systems is per the HPI.  All other systems were reviewed and are negative.  Physical Exam: BP 120/70  Pulse 55  Ht 5\' 1"  (1.549 m)  Wt 179 lb 12.8 oz (81.557 kg)  BMI 33.99 kg/m2 Patient is very pleasant and in no acute distress. Weight is stable. Skin is warm and dry. Color is normal.  HEENT is unremarkable. Normocephalic/atraumatic. PERRL. Sclera are nonicteric. Neck is supple. No masses. No JVD. Lungs are clear. Cardiac exam shows a regular rate and rhythm. Abdomen is soft. Extremities are without edema. Gait and ROM are intact. No gross neurologic deficits noted.  Wt Readings from Last 3 Encounters:  01/10/14 179 lb 12.8 oz (81.557 kg)  12/20/13 178 lb (80.74 kg)  11/30/13 179 lb 6.4 oz (81.375 kg)     LABORATORY DATA: EKG today shows sinus brady.   Lab Results  Component Value Date   WBC 6.2 12/20/2013   HGB 17.4* 12/20/2013   HCT 49.5* 12/20/2013   PLT 136* 12/20/2013   GLUCOSE 98 12/20/2013   CHOL 169 12/20/2011   TRIG 108.0 12/20/2011   HDL 78.80 12/20/2011   LDLCALC 69 12/20/2011   ALT 18 12/20/2011   AST 26 12/20/2011   NA 140 12/20/2013   K  4.0 12/20/2013   CL 100 12/20/2013   CREATININE 1.04 12/20/2013   BUN 23 12/20/2013   CO2 21 12/20/2013   TSH 1.875 09/08/2013   INR 2.8 12/27/2013    Lab Results  Component Value Date   INR 2.8 12/27/2013   INR 1.13 12/20/2013   INR 2.8 12/10/2013    Myoview Impression from February 2015  Exercise Capacity: [place] with no exercise.  BP Response: Hypotensive blood pressure response.  Clinical Symptoms: There is chest heaviness.  ECG Impression: No significant ST segment change suggestive of  ischemia.  Comparison with Prior Nuclear Study: No images to compare  Overall Impression: Normal stress nuclear study.  LV Ejection Fraction: Study not gated. LV Wall Motion: NA  Kirk Ruths  Echo Study Conclusions from December 2014  - Left ventricle: The cavity size was normal. Wall thickness was normal. Systolic function was moderately to severely reduced. The estimated ejection fraction was in the range of 30% to 35%. Diffuse hypokinesis. - Left atrium: The atrium was moderately dilated. - Right atrium: The atrium was moderately dilated. - Pulmonary arteries: PA peak pressure: 77mm Hg (S).   Assessment / Plan: 1. Atrial fib - on amiodarone - now s/p cardioversion - holding in sinus. Has had one breakthrough spell last month - will continue with her current regimen. Not sure what to make of her symptoms she had here earlier today. She is fine now. Will see her back in June/July if back from [place] - they will call to arrange. I will be happy to see and get her back to Dr. Rayann Heman.   2. LV Dysfunction - looks compensated - will recheck her echo before she leaves on May 10th.   3. Chronic anticoagulation - no problems noted.   4. Satisfactory recent Myoview without ischemia   Patient is agreeable to this plan and will call if any problems develop in the interim.   Burtis Junes, RN, [facility]  [address] [place]  [place], [place] [address]  [phone]

## 2014-01-16 ENCOUNTER — Encounter: Payer: Self-pay | Admitting: Family Medicine

## 2014-01-16 ENCOUNTER — Ambulatory Visit (INDEPENDENT_AMBULATORY_CARE_PROVIDER_SITE_OTHER): Payer: Medicare Other | Admitting: Family Medicine

## 2014-01-16 VITALS — BP 126/80 | HR 68 | Temp 97.7°F | Wt 184.0 lb

## 2014-01-16 DIAGNOSIS — H6123 Impacted cerumen, bilateral: Secondary | ICD-10-CM

## 2014-01-16 DIAGNOSIS — H6120 Impacted cerumen, unspecified ear: Secondary | ICD-10-CM

## 2014-01-16 NOTE — Progress Notes (Signed)
Pre visit review using our clinic review tool, if applicable. No additional management support is needed unless otherwise documented below in the visit note. 

## 2014-01-16 NOTE — Progress Notes (Signed)
   Subjective:    Patient ID: Angel Meyer, female    DOB: 29-Aug-1933, 80 y.o.   MRN: [id number]  HPI Comments: Patient is a 80 year-old female presenting with decreased hearing for the past few weeks. Patient has history of cerumen impaction and states it feels the same as it did last fall when cerumen removal by irrigation was performed. She does not use q-tips. Patient denies tinnitus, dizziness, loss of balance, headaches, nausea.      Review of Systems  Constitutional: Negative for fever and chills.  HENT: Positive for hearing loss. Negative for sinus pressure, tinnitus and trouble swallowing.   Eyes: Negative for visual disturbance.  Respiratory: Negative for cough and shortness of breath.   Cardiovascular: Negative for chest pain.  Neurological: Negative for dizziness, weakness, light-headedness, numbness and headaches.       Objective:   Physical Exam  Constitutional: She appears well-developed and well-nourished.  HENT:  Head: Normocephalic.  Right Ear: Tympanic membrane normal.  Left Ear: Tympanic membrane normal.  Mouth/Throat: Oropharynx is clear and moist and mucous membranes are normal.  Cerumen initially obstructing visualization of TMs bilaterally. After irrigation, TMs normal, some mild injection of left posterior wall of canal.   Eyes: Conjunctivae are normal. Pupils are equal, round, and reactive to light.  Cardiovascular: Normal rate and normal heart sounds.   Pulses:      Radial pulses are 2+ on the right side, and 2+ on the left side.  Pulmonary/Chest: Effort normal and breath sounds normal.  Abdominal: Soft. There is no tenderness.  Lymphadenopathy:       Head (right side): No submental, no submandibular, no tonsillar, no preauricular and no posterior auricular adenopathy present.       Head (left side): No submental, no submandibular, no tonsillar, no preauricular and no posterior auricular adenopathy present.    She has no cervical adenopathy.       Right  axillary: No pectoral and no lateral adenopathy present.       Left axillary: No pectoral and no lateral adenopathy present.      Right: No supraclavicular adenopathy present.       Left: No supraclavicular adenopathy present.  Neurological: She is alert. Coordination and gait normal.  Skin: Skin is warm and dry.          Assessment & Plan:  1. Cerumen Impaction Irrigated ears to remove cerumen. Curettage not necessary. Patient given educational handout.    [place], PA-S  As above.  Pt able to hear better afterwards. Mild redness right ant canal after irrigation but no active bleeding and no pain.  TMs look good/normal.  Carolann Littler, MD

## 2014-01-16 NOTE — Patient Instructions (Signed)
Cerumen Impaction A cerumen impaction is when the wax in your ear forms a plug. This plug usually causes reduced hearing. Sometimes it also causes an earache or dizziness. Removing a cerumen impaction can be difficult and painful. The wax sticks to the ear canal. The canal is sensitive and bleeds easily. If you try to remove a heavy wax buildup with a cotton tipped swab, you may push it in further. Irrigation with water, suction, and small ear curettes may be used to clear out the wax. If the impaction is fixed to the skin in the ear canal, ear drops may be needed for a few days to loosen the wax. People who build up a lot of wax frequently can use ear wax removal products available in your local drugstore. SEEK MEDICAL CARE IF:  You develop an earache, increased hearing loss, or marked dizziness. Document Released: 10/21/2004 Document Revised: 12/06/2011 Document Reviewed: 12/11/2009 Exit[employer] Patient Information 2014 [employer].  

## 2014-01-22 DIAGNOSIS — S42209A Unspecified fracture of upper end of unspecified humerus, initial encounter for closed fracture: Secondary | ICD-10-CM | POA: Diagnosis not present

## 2014-01-24 ENCOUNTER — Ambulatory Visit (INDEPENDENT_AMBULATORY_CARE_PROVIDER_SITE_OTHER): Payer: Medicare Other | Admitting: General Practice

## 2014-01-24 DIAGNOSIS — Z5181 Encounter for therapeutic drug level monitoring: Secondary | ICD-10-CM

## 2014-01-24 DIAGNOSIS — I4891 Unspecified atrial fibrillation: Secondary | ICD-10-CM | POA: Diagnosis not present

## 2014-01-24 LAB — POCT INR: INR: 2.9

## 2014-01-24 NOTE — Progress Notes (Signed)
Pre visit review using our clinic review tool, if applicable. No additional management support is needed unless otherwise documented below in the visit note. 

## 2014-01-29 ENCOUNTER — Other Ambulatory Visit (INDEPENDENT_AMBULATORY_CARE_PROVIDER_SITE_OTHER): Payer: Medicare Other

## 2014-01-29 ENCOUNTER — Ambulatory Visit (HOSPITAL_COMMUNITY): Payer: Medicare Other | Attending: Cardiology | Admitting: Radiology

## 2014-01-29 DIAGNOSIS — I4891 Unspecified atrial fibrillation: Secondary | ICD-10-CM

## 2014-01-29 DIAGNOSIS — I509 Heart failure, unspecified: Secondary | ICD-10-CM | POA: Insufficient documentation

## 2014-01-29 DIAGNOSIS — E785 Hyperlipidemia, unspecified: Secondary | ICD-10-CM | POA: Insufficient documentation

## 2014-01-29 DIAGNOSIS — I10 Essential (primary) hypertension: Secondary | ICD-10-CM | POA: Insufficient documentation

## 2014-01-29 DIAGNOSIS — I059 Rheumatic mitral valve disease, unspecified: Secondary | ICD-10-CM | POA: Insufficient documentation

## 2014-01-29 LAB — HEPATIC FUNCTION PANEL
ALT: 14 U/L (ref 0–35)
AST: 20 U/L (ref 0–37)
Albumin: 4 g/dL (ref 3.5–5.2)
Alkaline Phosphatase: 40 U/L (ref 39–117)
Bilirubin, Direct: 0.2 mg/dL (ref 0.0–0.3)
Total Bilirubin: 1.1 mg/dL (ref 0.2–1.2)
Total Protein: 6.7 g/dL (ref 6.0–8.3)

## 2014-01-29 LAB — LIPID PANEL
Cholesterol: 223 mg/dL — ABNORMAL HIGH (ref 0–200)
HDL: 87.1 mg/dL (ref >39.00)
LDL Cholesterol: 119 mg/dL — ABNORMAL HIGH (ref 0–99)
Total CHOL/HDL Ratio: 3
Triglycerides: 87 mg/dL (ref 0.0–149.0)
VLDL: 17.4 mg/dL (ref 0.0–40.0)

## 2014-01-29 LAB — CBC
HCT: 43.8 % (ref 36.0–46.0)
Hemoglobin: 14.7 g/dL (ref 12.0–15.0)
MCHC: 33.6 g/dL (ref 30.0–36.0)
MCV: 96 fl (ref 78.0–100.0)
Platelets: 138 10*3/uL — ABNORMAL LOW (ref 150.0–400.0)
RBC: 4.56 Mil/uL (ref 3.87–5.11)
RDW: 16.7 % — ABNORMAL HIGH (ref 11.5–15.5)
WBC: 4.7 10*3/uL (ref 4.0–10.5)

## 2014-01-29 LAB — BASIC METABOLIC PANEL
BUN: 21 mg/dL (ref 6–23)
CO2: 26 mEq/L (ref 19–32)
Calcium: 9.4 mg/dL (ref 8.4–10.5)
Chloride: 106 mEq/L (ref 96–112)
Creatinine, Ser: 0.9 mg/dL (ref 0.4–1.2)
GFR: 66.82 mL/min (ref >60.00)
Glucose, Bld: 77 mg/dL (ref 70–99)
Potassium: 4.1 mEq/L (ref 3.5–5.1)
Sodium: 139 mEq/L (ref 135–145)

## 2014-01-29 NOTE — Progress Notes (Signed)
Echocardiogram performed.  

## 2014-01-30 ENCOUNTER — Other Ambulatory Visit: Payer: Self-pay | Admitting: *Deleted

## 2014-01-30 MED ORDER — SPIRONOLACTONE 25 MG PO TABS: 25.0000 mg | ORAL_TABLET | Freq: Every day | ORAL | Status: DC

## 2014-01-30 MED ORDER — AMIODARONE HCL 200 MG PO TABS: 200.0000 mg | ORAL_TABLET | Freq: Every day | ORAL | Status: DC

## 2014-02-03 ENCOUNTER — Other Ambulatory Visit: Payer: Self-pay | Admitting: Nurse Practitioner

## 2014-02-05 ENCOUNTER — Ambulatory Visit: Payer: Medicare Other | Admitting: Nurse Practitioner

## 2014-02-07 ENCOUNTER — Other Ambulatory Visit: Payer: Self-pay

## 2014-02-07 MED ORDER — AMIODARONE HCL 200 MG PO TABS: 200.0000 mg | ORAL_TABLET | Freq: Every day | ORAL | Status: DC

## 2014-03-11 ENCOUNTER — Telehealth: Payer: Self-pay | Admitting: Family Medicine

## 2014-03-11 NOTE — Telephone Encounter (Signed)
[facility] in [place]  has order for pt that states "draw pt inr as needed /prn" but no dx code. They say ok to fax order w/ dx code to [phone]

## 2014-03-12 DIAGNOSIS — I4891 Unspecified atrial fibrillation: Secondary | ICD-10-CM | POA: Diagnosis not present

## 2014-03-12 LAB — PROTIME-INR: INR: 2.5 — AB (ref <=1.1)

## 2014-03-13 ENCOUNTER — Ambulatory Visit (INDEPENDENT_AMBULATORY_CARE_PROVIDER_SITE_OTHER): Payer: Medicare Other | Admitting: General Practice

## 2014-03-13 ENCOUNTER — Telehealth: Payer: Self-pay | Admitting: Pulmonary Disease

## 2014-03-13 DIAGNOSIS — I4891 Unspecified atrial fibrillation: Secondary | ICD-10-CM

## 2014-03-13 DIAGNOSIS — Z5181 Encounter for therapeutic drug level monitoring: Secondary | ICD-10-CM

## 2014-03-13 NOTE — Telephone Encounter (Signed)
ok 

## 2014-03-13 NOTE — Telephone Encounter (Signed)
Will forward to to RA as an [place]

## 2014-03-13 NOTE — Progress Notes (Signed)
Pre visit review using our clinic review tool, if applicable. No additional management support is needed unless otherwise documented below in the visit note. 

## 2014-03-20 ENCOUNTER — Inpatient Hospital Stay: Admission: RE | Admit: 2014-03-20 | Payer: Medicare Other | Source: Ambulatory Visit

## 2014-04-25 ENCOUNTER — Ambulatory Visit (INDEPENDENT_AMBULATORY_CARE_PROVIDER_SITE_OTHER): Payer: Medicare Other | Admitting: General Practice

## 2014-04-25 DIAGNOSIS — Z5181 Encounter for therapeutic drug level monitoring: Secondary | ICD-10-CM | POA: Diagnosis not present

## 2014-04-25 DIAGNOSIS — I4891 Unspecified atrial fibrillation: Secondary | ICD-10-CM | POA: Diagnosis not present

## 2014-04-25 LAB — POCT INR: INR: 1.6

## 2014-04-25 NOTE — Progress Notes (Signed)
Pre visit review using our clinic review tool, if applicable. No additional management support is needed unless otherwise documented below in the visit note. 

## 2014-04-26 ENCOUNTER — Other Ambulatory Visit: Payer: Self-pay

## 2014-04-26 MED ORDER — AMIODARONE HCL 200 MG PO TABS: 200.0000 mg | ORAL_TABLET | Freq: Every day | ORAL | Status: DC

## 2014-05-02 ENCOUNTER — Other Ambulatory Visit: Payer: Self-pay | Admitting: Internal Medicine

## 2014-05-02 ENCOUNTER — Other Ambulatory Visit: Payer: Self-pay

## 2014-05-02 MED ORDER — POTASSIUM CHLORIDE CRYS ER 20 MEQ PO TBCR: 20.0000 meq | EXTENDED_RELEASE_TABLET | Freq: Every day | ORAL | Status: DC

## 2014-05-23 ENCOUNTER — Ambulatory Visit: Payer: Medicare Other

## 2014-05-23 ENCOUNTER — Ambulatory Visit (INDEPENDENT_AMBULATORY_CARE_PROVIDER_SITE_OTHER): Payer: Medicare Other | Admitting: Family

## 2014-05-23 DIAGNOSIS — Z5181 Encounter for therapeutic drug level monitoring: Secondary | ICD-10-CM | POA: Diagnosis not present

## 2014-05-23 DIAGNOSIS — I4891 Unspecified atrial fibrillation: Secondary | ICD-10-CM | POA: Diagnosis not present

## 2014-05-23 DIAGNOSIS — I482 Chronic atrial fibrillation, unspecified: Secondary | ICD-10-CM

## 2014-05-23 LAB — POCT INR: INR: 1.7

## 2014-05-23 NOTE — Patient Instructions (Signed)
Take extra half tablet today and tomorrow and then continue 1 tablet daily except 2 tablets on Sunday/Wednesday. Re-check in 3 weeks.  Anticoagulation Dose Instructions as of 05/23/2014     Angel Meyer Tue Wed Thu Fri Sat   New Dose 5 mg 2.5 mg 2.5 mg 5 mg 2.5 mg 2.5 mg 2.5 mg    Description       Take extra half tablet today and tomorrow and then continue 1 tablet daily except 2 tablets on Sunday/Wednesday. Re-check in 4 weeks.

## 2014-05-30 ENCOUNTER — Encounter: Payer: Self-pay | Admitting: Family Medicine

## 2014-05-30 ENCOUNTER — Ambulatory Visit (INDEPENDENT_AMBULATORY_CARE_PROVIDER_SITE_OTHER): Payer: Medicare Other | Admitting: Family Medicine

## 2014-05-30 VITALS — BP 124/80 | HR 65 | Temp 97.4°F | Wt 189.0 lb

## 2014-05-30 DIAGNOSIS — F09 Unspecified mental disorder due to known physiological condition: Secondary | ICD-10-CM | POA: Diagnosis not present

## 2014-05-30 DIAGNOSIS — R5383 Other fatigue: Secondary | ICD-10-CM | POA: Diagnosis not present

## 2014-05-30 DIAGNOSIS — R413 Other amnesia: Secondary | ICD-10-CM

## 2014-05-30 DIAGNOSIS — R5381 Other malaise: Secondary | ICD-10-CM | POA: Diagnosis not present

## 2014-05-30 DIAGNOSIS — R4189 Other symptoms and signs involving cognitive functions and awareness: Secondary | ICD-10-CM

## 2014-05-30 DIAGNOSIS — L299 Pruritus, unspecified: Secondary | ICD-10-CM

## 2014-05-30 LAB — TSH: TSH: 1.67 u[IU]/mL (ref 0.35–4.50)

## 2014-05-30 LAB — VITAMIN B12: Vitamin B-12: 288 pg/mL (ref 211–911)

## 2014-05-30 MED ORDER — TRIAMCINOLONE 0.1 % CREAM:EUCERIN CREAM 1:1: 1.0000 "application " | TOPICAL_CREAM | Freq: Two times a day (BID) | CUTANEOUS | Status: DC | PRN

## 2014-05-30 NOTE — Progress Notes (Signed)
Subjective:    Patient ID: Angel Meyer, female    DOB: [birth year], 80 y.o.   MRN: [id number]  Rash Associated symptoms include fatigue. Pertinent negatives include no fever or shortness of breath.   Patient has chronic problems including obesity, atrial fibrillation, congestive heart failure, hyperlipidemia, hypertension, obstructive sleep apnea, restless leg syndrome. Seen today for the following concerns:  She has pruritis involving both arms and mostly forearms. She just recently returned from the beach and had excessive sun but no urgency. She has tried over-the-counter hydrocortisone and some type of anti-itch cream without improvement. She denies any other areas of pruritus. Symptoms are moderate.  Daughter is concerned about some possible cognitive impairment. She has had some difficulties with short-term memory. Thyroid function last year normal. She does take amiodarone. No history of B12 testing. Denies any headaches. No history of CVA. Family thinks these symptoms have been coming on gradually. No recent head injury.    Past Medical History  Diagnosis Date  . Arthritis   . Persistent atrial fibrillation     failed on Flecainide and Tikosyn  . Renal calculi   . Hypertension   . Hyperlipidemia   . Rheumatic fever     age 78  . History of blood transfusion   . Incontinence of urine   . Diastolic dysfunction     preserved EF,  CHF In setting of afib previously  . Osteoporosis   . OSA (obstructive sleep apnea)     uses CPAP  . Restless legs   . Systolic heart failure     EF 30 to 35% per echo 08/2013  . Dysrhythmia     afib   Past Surgical History  Procedure Laterality Date  . Breast surgery      benign biopsy  . Tonsillectomy    . Abdominal hysterectomy    . Spine surgery      laminectomy 1965, spinal fusion with rod 2005  . Replacement total knee  2005  . Kidney stone surgery      1997 removed  . Cardioversion N/A 09/10/2013    Procedure: CARDIOVERSION;   Surgeon: Thompson Grayer, MD;  Location: [facility];  Service: Cardiovascular;  Laterality: N/A;  . Cardioversion N/A 11/16/2013    Procedure: CARDIOVERSION;  Surgeon: Sueanne Margarita, MD;  Location: [facility] ENDOSCOPY;  Service: Cardiovascular;  Laterality: N/A;    reports that she has never smoked. She does not have any smokeless tobacco history on file. She reports that she drinks alcohol. She reports that she does not use illicit drugs. family history includes Cancer in her mother and paternal grandfather; Diabetes in her mother. Allergies  Allergen Reactions  . Ciprofloxacin Nausea And Vomiting  . Demerol [Meperidine] Nausea And Vomiting  . Sulfa Antibiotics Nausea And Vomiting      Review of Systems  Constitutional: Positive for fatigue. Negative for fever and chills.  Respiratory: Negative for shortness of breath.   Cardiovascular: Negative for chest pain.  Genitourinary: Negative for dysuria.  Skin: Positive for rash.  Psychiatric/Behavioral: Negative for confusion, dysphoric mood and agitation.       Objective:   Physical Exam  Constitutional: She appears well-developed and well-nourished.  Neck: Neck supple. No thyromegaly present.  Cardiovascular: Normal rate and regular rhythm.   Pulmonary/Chest: Effort normal and breath sounds normal. No respiratory distress. She has no wheezes. She has no rales.  Musculoskeletal: She exhibits no edema.  Neurological: She is alert. No cranial nerve deficit. Coordination normal.  MMSE 25/30  Skin: No rash noted.  She does not have any specific rash but does have a couple excoriations on her forearms and generally dry skin involving both forearms.  Psychiatric: She has a normal mood and affect. Her behavior is normal.          Assessment & Plan:  #1 dry skin. Probably exacerbated by recent sun exposure. We've recommended triamcinolone 0.1%/Eucerin compound 1-1 and use twice daily as needed #2 concern for cognitive impairment. 25/30 Mini-Mental  status exam today. We'll check B12 and TSH. If normal, consider trial of Aricept. Had these discussions with both patient and her daughter and they agree.  Will reassess in one month.

## 2014-05-30 NOTE — Progress Notes (Signed)
Pre visit review using our clinic review tool, if applicable. No additional management support is needed unless otherwise documented below in the visit note. 

## 2014-05-31 ENCOUNTER — Other Ambulatory Visit: Payer: Self-pay

## 2014-05-31 MED ORDER — DONEPEZIL HCL 5 MG PO TBDP: 5.0000 mg | ORAL_TABLET | Freq: Every day | ORAL | Status: DC

## 2014-06-07 ENCOUNTER — Telehealth: Payer: Self-pay | Admitting: Family Medicine

## 2014-06-07 MED ORDER — DESOXIMETASONE 0.25 % EX CREA: 1.0000 "application " | TOPICAL_CREAM | Freq: Two times a day (BID) | CUTANEOUS | Status: DC

## 2014-06-07 NOTE — Telephone Encounter (Signed)
Topicort 0.25% cream (I have already sent to their pharmacy)  Make sure they know not to use > 2 weeks continuously.

## 2014-06-07 NOTE — Telephone Encounter (Signed)
Pt daughter is aware.

## 2014-06-07 NOTE — Telephone Encounter (Signed)
Pt was seen on 05-30-14 and was given a cream per daughter [name] and cream is not working requesting something else call into [facility].

## 2014-06-11 ENCOUNTER — Encounter: Payer: Self-pay | Admitting: Nurse Practitioner

## 2014-06-11 ENCOUNTER — Ambulatory Visit (INDEPENDENT_AMBULATORY_CARE_PROVIDER_SITE_OTHER): Payer: Medicare Other | Admitting: Nurse Practitioner

## 2014-06-11 VITALS — BP 120/76 | HR 52 | Ht 61.0 in | Wt 188.8 lb

## 2014-06-11 DIAGNOSIS — Z79899 Other long term (current) drug therapy: Secondary | ICD-10-CM | POA: Diagnosis not present

## 2014-06-11 DIAGNOSIS — Z7901 Long term (current) use of anticoagulants: Secondary | ICD-10-CM

## 2014-06-11 DIAGNOSIS — I5022 Chronic systolic (congestive) heart failure: Secondary | ICD-10-CM | POA: Diagnosis not present

## 2014-06-11 LAB — HEPATIC FUNCTION PANEL
ALT: 16 U/L (ref 0–35)
AST: 25 U/L (ref 0–37)
Albumin: 4.1 g/dL (ref 3.5–5.2)
Alkaline Phosphatase: 45 U/L (ref 39–117)
Bilirubin, Direct: 0.1 mg/dL (ref 0.0–0.3)
Total Bilirubin: 1 mg/dL (ref 0.2–1.2)
Total Protein: 7.3 g/dL (ref 6.0–8.3)

## 2014-06-11 LAB — BASIC METABOLIC PANEL
BUN: 23 mg/dL (ref 6–23)
CO2: 24 mEq/L (ref 19–32)
Calcium: 9.4 mg/dL (ref 8.4–10.5)
Chloride: 104 mEq/L (ref 96–112)
Creatinine, Ser: 1.1 mg/dL (ref 0.4–1.2)
GFR: 49.37 mL/min — ABNORMAL LOW (ref >60.00)
Glucose, Bld: 85 mg/dL (ref 70–99)
Potassium: 4.1 mEq/L (ref 3.5–5.1)
Sodium: 138 mEq/L (ref 135–145)

## 2014-06-11 NOTE — Patient Instructions (Addendum)
Let's check labs today  I will see you in 4 months  Try to be as active as you can  Elevate your legs daily - knee high support stockings recommended  Ok to take the Lasix and potassium every other day but may have to adjust back to every day based on weight and salt use.  Call the [place] office at [phone] if you have any questions, problems or concerns.

## 2014-06-11 NOTE — Progress Notes (Signed)
Angel Meyer Date of Birth: 08/06/1933 Medical Record #[id number]  History of Present Illness: Angel Meyer is seen back today for a 6 week visit. Seen for Dr. Rayann Heman. She has PAF, rheumatic fever at age 80, HTN and OSA.   In December she presented with progressive dyspnea, orthopnea and cough for 6 to 8 weeks - noted to be in AF with RVR and volume overload. She was treated with flecainide which was unsuccessful. Then placed on Tikosyn and was cardioverted - this also unsuccessful. Now managed with rate control and anticoagulation. Seen by PCCM for her cough/congestion and was to discuss possible amiodarone therapy which was subsequently started. Echo showed her EF down to 30 to 35%. We proceeded on with cardioversion which was successful. She was bradycardic following the procedure and her Coreg and Diltiazem were stopped.  I was able to review some of her records from [place] received on 11/06/13 from Dr. Kerry Dory from [place] - I can see where she was put on amiodarone back in 2012 - then her dose was cut back and then stopped in May of 2013 along with discontinuation of her Coreg - not clear to me as to why. Looks like there was some shortness of breath and fatigue noted and low HR. Her diagnosis was always listed as "paroxysmal AF" and not chronic. Fatigue and DOE seemed to be a prevalent complaint.   I saw her back in March of 2015. She was felt to be doing well. Amiodarone was cut back to her maintenance dose. Really had had no improvement in her symptoms with restoration of sinus rhythm.  Since she was last here, she has seen pulmonary - for repeat scan in June for follow up of her lung nodule. She has also been to the ER at the end of March with chest pressure and noted to be in atrial fib with RVR - seen by Dr. Rayann Heman as well with recommendations as follows - Angel Meyer presents with symptomatic atrial fibrillation. She is now back in SR. She does not appear volume overloaded on exam and her  CXR is clear. ProBNP only minimally elevated. At this time she is stable for discharge home to follow-up in the office as scheduled with Truitt Merle, NP in 3 weeks. She will continue her current medication regimen, including amiodarone 200 mg once daily. She does not need any AV nodal agents on a daily basis. Dr. Rayann Heman gave instructions for management of atrial fibrillation in the outpatient setting as long as she is stable. If she feels she is in afib and pulse rate >100 bpm, she was instructed to take ONE additional dose of amiodarone (200 mg for one dose only). Also, she was instructed to take short acting diltiazem 30 mg every 6 hours as needed for pulse >100 bpm when in afib. Angel Meyer and her daughter expressed verbal understanding and agree with this plan of care.   Signed,  Ileene Hutchinson, PA-C  12/20/2013, 2:23 PM  I have seen, examined the patient, and reviewed the above assessment and plan. Changes to above are made where necessary. The patient is well known to me. Her afib has been rather difficult to treat. She has done better recently with amiodarone therapy. She developed abrupt onset of afib earlier today. She has converted back to sinus and now appears to be back to her baseline. I think that we should resume her prior regimen. IF she has further afib, she can take additional amiodarone as well as  cardizem for rate control. (Given prior baseline sinus bradycardia I will not start daily rate control but will use cardizem as needed). OK to discharge with outpatient followup.  Co Sign: Thompson Grayer, MD  12/20/2013  10:54 PM   I saw her back in April - she was doing ok without anymore AF. Went on to [place] for the summer.   Comes back today. Here with her daughter. Has been doing ok. No chest pain. Breathing ok. Some swelling but has had probably more salt from traveling. Rhythm has been ok. PCP starting Aricept. Denies being depressed but still dealing with moving here - has her house  up for sale - just lots of adjustments to make. No palpitations. Ok on her medicines but would like to cut the Lasix back. Has what sounds like an overactive bladder. Weight up - does try to walk for about 20 minutes a day.   Current Outpatient Prescriptions  Medication Sig Dispense Refill  . acetaminophen (TYLENOL) 650 MG CR tablet Take 650 mg by mouth every 8 (eight) hours as needed for pain.       Marland Kitchen amiodarone (PACERONE) 200 MG tablet Take 1 tablet (200 mg total) by mouth daily.  180 tablet  1  . Cholecalciferol (VITAMIN D3) 2000 UNITS TABS Take 2,000 Units by mouth daily. Once a day      . desoximetasone (TOPICORT) 0.25 % cream Apply 1 application topically 2 (two) times daily. Do not use more than 2 weeks continuously.  60 g  0  . diltiazem (CARDIZEM) 30 MG tablet Take only as needed. AFib w/ pulse >100 bpm, take 30 mg (1 tab) by mouth once. May repeat 30 mg (1 tab) every 6 hours PRN for rate >100 bpm.  30 tablet  3  . docusate sodium (COLACE) 100 MG capsule Take 100 mg by mouth daily as needed for mild constipation.      Marland Kitchen donepezil (ARICEPT ODT) 5 MG disintegrating tablet Take 1 tablet (5 mg total) by mouth at bedtime.  30 tablet  1  . escitalopram (LEXAPRO) 10 MG tablet Take 1 tablet (10 mg total) by mouth daily.  90 tablet  3  . furosemide (LASIX) 40 MG tablet Take 0.5 tablets (20 mg total) by mouth daily.  30 tablet  4  . nitroGLYCERIN (NITROSTAT) 0.4 MG SL tablet Place 1 tablet (0.4 mg total) under the tongue every 5 (five) minutes as needed for chest pain.  25 tablet  3  . Omega-3 Fatty Acids (FISH OIL) 1200 MG CAPS Take 1,200 mg by mouth 2 (two) times daily.       Marland Kitchen oxybutynin (DITROPAN-XL) 10 MG 24 hr tablet Take 1 tablet (10 mg total) by mouth daily.  90 tablet  3  . potassium chloride SA (K-DUR,KLOR-CON) 20 MEQ tablet Take 1 tablet (20 mEq total) by mouth daily.  30 tablet  4  . ranitidine (ZANTAC) 75 MG tablet Take 75 mg by mouth as needed for heartburn.      Marland Kitchen rOPINIRole (REQUIP)  1 MG tablet Take 1 tablet (1 mg total) by mouth 2 (two) times daily.  180 tablet  3  . rosuvastatin (CRESTOR) 40 MG tablet Take 1 tablet (40 mg total) by mouth daily.  90 tablet  3  . spironolactone (ALDACTONE) 25 MG tablet TAKE 1 TABLET BY MOUTH EVERY DAY  90 tablet  3  . Triamcinolone Acetonide (TRIAMCINOLONE 0.1 % CREAM : EUCERIN) CREA Apply 1 application topically 2 (two) times daily as needed.  1 each  0  . warfarin (COUMADIN) 2.5 MG tablet Take as directed by anticoagulation clinic  120 tablet  1  . [DISCONTINUED] oxybutynin (DITROPAN-XL) 10 MG 24 hr tablet Take 10 mg by mouth daily.       No current facility-administered medications for this visit.   Facility-Administered Medications Ordered in Other Visits  Medication Dose Route Frequency Provider Last Rate Last Dose  . propofol (DIPRIVAN) 10 mg/mL bolus/IV push    Anesthesia Intra-op Scheryl Darter, CRNA   30 mg at 11/16/13 1232    Allergies  Allergen Reactions  . Ciprofloxacin Nausea And Vomiting  . Demerol [Meperidine] Nausea And Vomiting  . Sulfa Antibiotics Nausea And Vomiting    Past Medical History  Diagnosis Date  . Arthritis   . Persistent atrial fibrillation     failed on Flecainide and Tikosyn  . Renal calculi   . Hypertension   . Hyperlipidemia   . Rheumatic fever     age 80  . History of blood transfusion   . Incontinence of urine   . Diastolic dysfunction     preserved EF,  CHF In setting of afib previously  . Osteoporosis   . OSA (obstructive sleep apnea)     uses CPAP  . Restless legs   . Systolic heart failure     EF 30 to 35% per echo 08/2013  . Dysrhythmia     afib    Past Surgical History  Procedure Laterality Date  . Breast surgery      benign biopsy  . Tonsillectomy    . Abdominal hysterectomy    . Spine surgery      laminectomy 1965, spinal fusion with rod 2005  . Replacement total knee  2005  . Kidney stone surgery      1997 removed  . Cardioversion N/A 09/10/2013    Procedure:  CARDIOVERSION;  Surgeon: Thompson Grayer, MD;  Location: [facility];  Service: Cardiovascular;  Laterality: N/A;  . Cardioversion N/A 11/16/2013    Procedure: CARDIOVERSION;  Surgeon: Sueanne Margarita, MD;  Location: [facility] ENDOSCOPY;  Service: Cardiovascular;  Laterality: N/A;    History  Smoking status  . Never Smoker   Smokeless tobacco  . Not on file    History  Alcohol Use  . Yes    Comment: glass of wine each day    Family History  Problem Relation Age of Onset  . Cancer Mother     breast  . Diabetes Mother   . Cancer Paternal Grandfather     Review of Systems: The review of systems is per the HPI.  All other systems were reviewed and are negative.  Physical Exam: BP 120/76  Pulse 52  Ht 5\' 1"  (1.549 m)  Wt 188 lb 12.8 oz (85.639 kg)  BMI 35.69 kg/m2 Patient is very pleasant and in no acute distress. She is a little tearful today - seems a little depressed to me. Weight up 4 pounds. Skin is warm and dry. Color is normal.  HEENT is unremarkable. Normocephalic/atraumatic. PERRL. Sclera are nonicteric. Neck is supple. No masses. No JVD. Lungs are clear. Cardiac exam shows a regular rate and rhythm. Abdomen is soft. Extremities are without edema. Gait and ROM are intact. No gross neurologic deficits noted.  Wt Readings from Last 3 Encounters:  06/11/14 188 lb 12.8 oz (85.639 kg)  05/30/14 189 lb (85.73 kg)  01/16/14 184 lb (83.462 kg)    LABORATORY DATA/PROCEDURES:  Lab Results  Component Value Date  WBC 4.7 01/29/2014   HGB 14.7 01/29/2014   HCT 43.8 01/29/2014   PLT 138.0* 01/29/2014   GLUCOSE 77 01/29/2014   CHOL 223* 01/29/2014   TRIG 87.0 01/29/2014   HDL 87.10 01/29/2014   LDLCALC 119* 01/29/2014   ALT 14 01/29/2014   AST 20 01/29/2014   NA 139 01/29/2014   K 4.1 01/29/2014   CL 106 01/29/2014   CREATININE 0.9 01/29/2014   BUN 21 01/29/2014   CO2 26 01/29/2014   TSH 1.67 05/30/2014   INR 1.7 05/23/2014    BNP (last 3 results)  Recent Labs  09/08/13 1433 10/08/13 1131 12/20/13 1055    PROBNP 2410.0* 405.0* 1618.0*     Assessment / Plan: Myoview Impression from February 2015  Exercise Capacity: [place] with no exercise.  BP Response: Hypotensive blood pressure response.  Clinical Symptoms: There is chest heaviness.  ECG Impression: No significant ST segment change suggestive of ischemia.  Comparison with Prior Nuclear Study: No images to compare  Overall Impression: Normal stress nuclear study.  LV Ejection Fraction: Study not gated. LV Wall Motion: NA  Kirk Ruths  Echo Study Conclusions from December 2014  - Left ventricle: The cavity size was normal. Wall thickness was normal. Systolic function was moderately to severely reduced. The estimated ejection fraction was in the range of 30% to 35%. Diffuse hypokinesis. - Left atrium: The atrium was moderately dilated. - Right atrium: The atrium was moderately dilated. - Pulmonary arteries: PA peak pressure: 36mm Hg (S).  Assessment / Plan:  1. Atrial fib - on amiodarone - now s/p cardioversion - holding in sinus by exam today. I have left her on her current regimen. Check follow up labs today as well. I will see back in 4 months.   2. LV Dysfunction - looks compensated. She has been eating more in a generalized fashion - will let her try her lasix QOD but understands that she may have to continue every day. She will continue with daily weights. Trying to cut back on portions.   3. Chronic anticoagulation - no problems noted.   4. Satisfactory recent Myoview without ischemia   Patient is agreeable to this plan and will call if any problems develop in the interim.   Burtis Junes, RN, [facility] [address] [place], [place]  [phone]

## 2014-06-12 LAB — CBC
HCT: 45.5 % (ref 36.0–46.0)
Hemoglobin: 15.6 g/dL — ABNORMAL HIGH (ref 12.0–15.0)
MCHC: 34.3 g/dL (ref 30.0–36.0)
MCV: 96.2 fl (ref 78.0–100.0)
Platelets: 128 10*3/uL — ABNORMAL LOW (ref 150.0–400.0)
RBC: 4.73 Mil/uL (ref 3.87–5.11)
RDW: 14.4 % (ref 11.5–15.5)
WBC: 6 10*3/uL (ref 4.0–10.5)

## 2014-06-13 ENCOUNTER — Ambulatory Visit (INDEPENDENT_AMBULATORY_CARE_PROVIDER_SITE_OTHER): Payer: Medicare Other | Admitting: Family

## 2014-06-13 DIAGNOSIS — Z5181 Encounter for therapeutic drug level monitoring: Secondary | ICD-10-CM

## 2014-06-13 DIAGNOSIS — I4891 Unspecified atrial fibrillation: Secondary | ICD-10-CM | POA: Diagnosis not present

## 2014-06-13 DIAGNOSIS — I482 Chronic atrial fibrillation, unspecified: Secondary | ICD-10-CM

## 2014-06-13 LAB — POCT INR: INR: 2.5

## 2014-06-13 NOTE — Patient Instructions (Signed)
Continue 1 tablet daily except 2 tablets on Sunday/Wednesday. Re-check in 4 weeks.  Anticoagulation Dose Instructions as of 06/13/2014     Dorene Grebe Tue Wed Thu Fri Sat   New Dose 5 mg 2.5 mg 2.5 mg 5 mg 2.5 mg 2.5 mg 2.5 mg    Description       Continue 1 tablet daily except 2 tablets on Sunday/Wednesday. Re-check in 4 weeks.

## 2014-06-20 ENCOUNTER — Telehealth: Payer: Self-pay | Admitting: Pulmonary Disease

## 2014-06-20 ENCOUNTER — Ambulatory Visit (INDEPENDENT_AMBULATORY_CARE_PROVIDER_SITE_OTHER)
Admission: RE | Admit: 2014-06-20 | Discharge: 2014-06-20 | Disposition: A | Discharge status: Home / Self Care | Payer: Medicare Other | Source: Ambulatory Visit | Attending: Pulmonary Disease | Admitting: Pulmonary Disease

## 2014-06-20 DIAGNOSIS — R911 Solitary pulmonary nodule: Secondary | ICD-10-CM

## 2014-06-20 NOTE — Telephone Encounter (Signed)
Called and someone answered, when asked to speak w/ trish line was d/c'd. wcb

## 2014-06-20 NOTE — Telephone Encounter (Signed)
Result Note     Resolved nodule   Called spoke with [name]. Aware of results.  Pt has pending appt 07/04/14. She wants to know if pt should cancel this since pt is doing well this far. Please advise thanks

## 2014-06-20 NOTE — Telephone Encounter (Signed)
lmomtcb x1 for [employer]

## 2014-06-20 NOTE — Telephone Encounter (Signed)
Returning call.Stanley A Dalton ° °

## 2014-06-20 NOTE — Telephone Encounter (Signed)
Keep appt to discuss PFTs & CT

## 2014-06-21 NOTE — Telephone Encounter (Signed)
Called and spoke with trish, pts daughter and she is aware that the pt will need to keep appt on 10/8 with Dr. Elsworth Soho.  Nothing further is needed.

## 2014-06-25 DIAGNOSIS — H18599 Other hereditary corneal dystrophies, unspecified eye: Secondary | ICD-10-CM | POA: Diagnosis not present

## 2014-06-25 DIAGNOSIS — H353190 Nonexudative age-related macular degeneration, unspecified eye, stage unspecified: Secondary | ICD-10-CM | POA: Diagnosis not present

## 2014-06-25 DIAGNOSIS — H40019 Open angle with borderline findings, low risk, unspecified eye: Secondary | ICD-10-CM | POA: Diagnosis not present

## 2014-07-04 ENCOUNTER — Ambulatory Visit (INDEPENDENT_AMBULATORY_CARE_PROVIDER_SITE_OTHER): Payer: Medicare Other | Admitting: Pulmonary Disease

## 2014-07-04 ENCOUNTER — Encounter: Payer: Self-pay | Admitting: Pulmonary Disease

## 2014-07-04 VITALS — BP 130/86 | HR 50 | Temp 96.8°F | Ht 62.0 in | Wt 192.6 lb

## 2014-07-04 DIAGNOSIS — I482 Chronic atrial fibrillation, unspecified: Secondary | ICD-10-CM

## 2014-07-04 DIAGNOSIS — G4733 Obstructive sleep apnea (adult) (pediatric): Secondary | ICD-10-CM | POA: Diagnosis not present

## 2014-07-04 DIAGNOSIS — R911 Solitary pulmonary nodule: Secondary | ICD-10-CM

## 2014-07-04 NOTE — Assessment & Plan Note (Signed)
Diffusion test to monitor effect of amiodarone on lungs - further FU with Dr Rayann Heman

## 2014-07-04 NOTE — Assessment & Plan Note (Signed)
We discussed CPAP therapy for obstructive sleep apnea  Flu shot

## 2014-07-04 NOTE — Patient Instructions (Signed)
Diffusion test to monitor effect of amiodarone on lungs - further FU with Dr Rayann Heman Nodule has resolved on CT scan - hence benign We discussed CPAP therapy for obstructive sleep apnea  Flu shot

## 2014-07-04 NOTE — Assessment & Plan Note (Signed)
Nodule has resolved on CT scan - hence benign

## 2014-07-04 NOTE — Progress Notes (Signed)
   Subjective:    Patient ID: Angel Meyer, female    DOB: Oct 23, 1932, 80 y.o.   MRN: [id number]  HPI  80 yo WF for FU of mild sleep apnea , yearly cough that worsens with seasonal changes She was hospitalized for Atrial Fib w/ RVR 09/08/13 . PCCM consulted for cough and dyspnea.  She has refractory Afib and was on amiodarone in past but was stopped due to cough. She had been tx for chronic cough w/ abx, steroids and narcotic cough suppressants. CT scan was done that did not show fibrosis .  PFT showed no airflow obstruction . She was approved for Amiodarone start, now on 200 mg daily  DLCO slightly was slightly diminshed at ~62%.  PSG 2010 showed mild OSA (AHI 8.2/h) with severe RLS (on requip) . Labs- Fe/ TIBC nml  Chief Complaint  Patient presents with  . Follow-up    Review CT scan (06/20/14). Pt reports breathing is unchanged.    Rpt CT showed resolution of nodule Not able to use CPAP - when she did use, no sig benefit No increase in dyspnea or cough Accompanied by daughter Remains on amio    Review of Systems neg for any significant sore throat, dysphagia, itching, sneezing, nasal congestion or excess/ purulent secretions, fever, chills, sweats, unintended wt loss, pleuritic or exertional cp, hempoptysis, orthopnea pnd or change in chronic leg swelling. Also denies presyncope, palpitations, heartburn, abdominal pain, nausea, vomiting, diarrhea or change in bowel or urinary habits, dysuria,hematuria, rash, arthralgias, visual complaints, headache, numbness weakness or ataxia.     Objective:   Physical Exam  Gen. Pleasant, well-nourished, in no distress ENT - no lesions, no post nasal drip Neck: No JVD, no thyromegaly, no carotid bruits Lungs: no use of accessory muscles, no dullness to percussion, clear without rales or rhonchi  Cardiovascular: Rhythm regular, heart sounds  normal, no murmurs or gallops, no peripheral edema Musculoskeletal: No deformities, no cyanosis or  clubbing        Assessment & Plan:

## 2014-07-15 ENCOUNTER — Ambulatory Visit: Payer: Medicare Other | Admitting: Family Medicine

## 2014-07-15 ENCOUNTER — Ambulatory Visit: Payer: Medicare Other

## 2014-07-18 ENCOUNTER — Encounter: Payer: Self-pay | Admitting: Family Medicine

## 2014-07-18 ENCOUNTER — Ambulatory Visit (INDEPENDENT_AMBULATORY_CARE_PROVIDER_SITE_OTHER): Payer: Medicare Other | Admitting: Family Medicine

## 2014-07-18 ENCOUNTER — Ambulatory Visit (INDEPENDENT_AMBULATORY_CARE_PROVIDER_SITE_OTHER): Payer: Medicare Other | Admitting: Family

## 2014-07-18 VITALS — BP 128/74 | HR 54 | Temp 98.0°F | Wt 192.0 lb

## 2014-07-18 DIAGNOSIS — Z5181 Encounter for therapeutic drug level monitoring: Secondary | ICD-10-CM

## 2014-07-18 DIAGNOSIS — I482 Chronic atrial fibrillation, unspecified: Secondary | ICD-10-CM

## 2014-07-18 DIAGNOSIS — N3941 Urge incontinence: Secondary | ICD-10-CM

## 2014-07-18 DIAGNOSIS — R4189 Other symptoms and signs involving cognitive functions and awareness: Secondary | ICD-10-CM | POA: Diagnosis not present

## 2014-07-18 LAB — POCT INR: INR: 1.1

## 2014-07-18 MED ORDER — MIRABEGRON ER 25 MG PO TB24: 25.0000 mg | ORAL_TABLET | Freq: Every day | ORAL | Status: DC

## 2014-07-18 MED ORDER — DONEPEZIL HCL 10 MG PO TBDP: 10.0000 mg | ORAL_TABLET | Freq: Every day | ORAL | Status: DC

## 2014-07-18 NOTE — Progress Notes (Signed)
Pre visit review using our clinic review tool, if applicable. No additional management support is needed unless otherwise documented below in the visit note. 

## 2014-07-18 NOTE — Patient Instructions (Signed)
Thursday, Friday take 5 mg (2 tabs). Then continue 1 tablet daily except 2 tablets on Sunday/Wednesday. Re-check in 10 days.   Anticoagulation Dose Instructions as of 07/18/2014     Angel Meyer Tue Wed Thu Fri Sat   New Dose 5 mg 2.5 mg 2.5 mg 5 mg 2.5 mg 2.5 mg 2.5 mg    Description       Thursday, Friday take 5 mg (2 tabs). Then continue 1 tablet daily except 2 tablets on Sunday/Wednesday. Re-check in 10 days.

## 2014-07-18 NOTE — Progress Notes (Signed)
Subjective:    Patient ID: Angel Meyer, female    DOB: [birth year], 80 y.o.   MRN: [id number]  HPI Patient is a for the following issues  Cognitive impairment. Recent MMSE 25/30. B12 and TSH were normal. She had some gradual loss of memory.  We started Aricept 5 mg daily and she has been compliant with. She had some light mares which she initially felt may be related but she's only had these 4 times over the past month. She denied any nausea or vomiting.  Long history of urinary urgency. She tried Detrol LA years ago but did not see good response. She's currently on oxybutynin 10 mg and has had some daytime and nighttime symptoms. . Avoids drinking late at night. No history of regular alcohol use. No recent burning with urination. She takes chronic furosemide 40 mg daily which can exacerbate.  Past Medical History  Diagnosis Date  . Arthritis   . Persistent atrial fibrillation     failed on Flecainide and Tikosyn  . Renal calculi   . Hypertension   . Hyperlipidemia   . Rheumatic fever     age 27  . History of blood transfusion   . Incontinence of urine   . Diastolic dysfunction     preserved EF,  CHF In setting of afib previously  . Osteoporosis   . OSA (obstructive sleep apnea)     uses CPAP  . Restless legs   . Systolic heart failure     EF 30 to 35% per echo 08/2013  . Dysrhythmia     afib   Past Surgical History  Procedure Laterality Date  . Breast surgery      benign biopsy  . Tonsillectomy    . Abdominal hysterectomy    . Spine surgery      laminectomy 1965, spinal fusion with rod 2005  . Replacement total knee  2005  . Kidney stone surgery      1997 removed  . Cardioversion N/A 09/10/2013    Procedure: CARDIOVERSION;  Surgeon: Thompson Grayer, MD;  Location: [place];  Service: Cardiovascular;  Laterality: N/A;  . Cardioversion N/A 11/16/2013    Procedure: CARDIOVERSION;  Surgeon: Sueanne Margarita, MD;  Location: [facility] ENDOSCOPY;  Service: Cardiovascular;  Laterality: N/A;      reports that she has never smoked. She does not have any smokeless tobacco history on file. She reports that she drinks alcohol. She reports that she does not use illicit drugs. family history includes Cancer in her mother and paternal grandfather; Diabetes in her mother. Allergies  Allergen Reactions  . Ciprofloxacin Nausea And Vomiting  . Demerol [Meperidine] Nausea And Vomiting  . Sulfa Antibiotics Nausea And Vomiting      Review of Systems  Constitutional: Negative for fever and chills.  Cardiovascular: Negative for chest pain, palpitations and leg swelling.  Genitourinary: Positive for urgency and frequency. Negative for dysuria and hematuria.  Psychiatric/Behavioral: Negative for confusion, dysphoric mood and agitation.       Objective:   Physical Exam  Constitutional: She appears well-developed and well-nourished. No distress.  Cardiovascular: Normal rate and regular rhythm.   Pulmonary/Chest: Effort normal and breath sounds normal. No respiratory distress. She has no wheezes. She has no rales.  Musculoskeletal: She exhibits no edema.          Assessment & Plan:  #1 cognitive impairment. Recent MMSE as above. Titrate Aricept 10 mg daily. Reassess in 2 months and repeat MMSE then. Consider addition of Namenda if  not seeing improvement at that time #2 urinary urgency. Myrbetriq 25 mg once daily. Reassess in 2 months. #3 health maintenance. Family requesting quadrivalent flu vaccine and we are out today. He'll be contacted when this exam.

## 2014-07-30 ENCOUNTER — Ambulatory Visit (INDEPENDENT_AMBULATORY_CARE_PROVIDER_SITE_OTHER): Payer: Medicare Other | Admitting: Family

## 2014-07-30 DIAGNOSIS — I482 Chronic atrial fibrillation, unspecified: Secondary | ICD-10-CM

## 2014-07-30 DIAGNOSIS — Z5181 Encounter for therapeutic drug level monitoring: Secondary | ICD-10-CM

## 2014-07-30 LAB — POCT INR: INR: 1.9

## 2014-07-30 MED ORDER — WARFARIN SODIUM 2.5 MG PO TABS: ORAL_TABLET | ORAL | Status: DC

## 2014-08-09 ENCOUNTER — Ambulatory Visit (INDEPENDENT_AMBULATORY_CARE_PROVIDER_SITE_OTHER): Payer: Medicare Other | Admitting: *Deleted

## 2014-08-09 ENCOUNTER — Ambulatory Visit: Payer: Medicare Other | Admitting: Family Medicine

## 2014-08-09 DIAGNOSIS — Z23 Encounter for immunization: Secondary | ICD-10-CM | POA: Diagnosis not present

## 2014-08-27 DIAGNOSIS — N390 Urinary tract infection, site not specified: Secondary | ICD-10-CM | POA: Diagnosis not present

## 2014-08-27 DIAGNOSIS — R350 Frequency of micturition: Secondary | ICD-10-CM | POA: Diagnosis not present

## 2014-08-29 ENCOUNTER — Ambulatory Visit (INDEPENDENT_AMBULATORY_CARE_PROVIDER_SITE_OTHER): Payer: Medicare Other | Admitting: Family

## 2014-08-29 DIAGNOSIS — Z5181 Encounter for therapeutic drug level monitoring: Secondary | ICD-10-CM

## 2014-08-29 DIAGNOSIS — I482 Chronic atrial fibrillation, unspecified: Secondary | ICD-10-CM

## 2014-08-29 LAB — POCT INR: INR: 1.2

## 2014-08-29 NOTE — Patient Instructions (Signed)
Take 2 tabs today and tomorrow. Then continue 1 tablet daily except 2 tablets on Sunday/Wednesday. Re-check in 3 weeks.   Anticoagulation Dose Instructions as of 08/29/2014      Angel Meyer Tue Wed Thu Fri Sat   New Dose 5 mg 2.5 mg 2.5 mg 5 mg 2.5 mg 2.5 mg 2.5 mg    Description        Take 2 tabs today and tomorrow. Then continue 1 tablet daily except 2 tablets on Sunday/Wednesday. Re-check in 3 weeks.

## 2014-09-05 DIAGNOSIS — H2511 Age-related nuclear cataract, right eye: Secondary | ICD-10-CM | POA: Diagnosis not present

## 2014-09-05 DIAGNOSIS — H524 Presbyopia: Secondary | ICD-10-CM | POA: Diagnosis not present

## 2014-09-17 ENCOUNTER — Other Ambulatory Visit: Payer: Self-pay

## 2014-09-17 ENCOUNTER — Ambulatory Visit (INDEPENDENT_AMBULATORY_CARE_PROVIDER_SITE_OTHER): Payer: Medicare Other | Admitting: Family

## 2014-09-17 ENCOUNTER — Telehealth: Payer: Self-pay | Admitting: Family Medicine

## 2014-09-17 DIAGNOSIS — I482 Chronic atrial fibrillation, unspecified: Secondary | ICD-10-CM

## 2014-09-17 DIAGNOSIS — Z5181 Encounter for therapeutic drug level monitoring: Secondary | ICD-10-CM | POA: Diagnosis not present

## 2014-09-17 LAB — POCT INR: INR: 1.4

## 2014-09-17 MED ORDER — ROPINIROLE HCL 1 MG PO TABS
1.0000 mg | ORAL_TABLET | Freq: Two times a day (BID) | ORAL | Status: DC
Start: 2014-09-17 — End: 2015-06-19

## 2014-09-17 NOTE — Patient Instructions (Signed)
Take 2 tabs today and tomorrow. Then continue 1 tablet daily except 2 tablets on Sunday/Wednesday. Re-check in 2 weeks.   Anticoagulation Dose Instructions as of 09/17/2014      Dorene Grebe Tue Wed Thu Fri Sat   New Dose 5 mg 2.5 mg 2.5 mg 5 mg 2.5 mg 2.5 mg 2.5 mg    Description        Take 2 tabs today and tomorrow. Then continue 1 tablet daily except 2 tablets on Sunday/Wednesday. Re-check in 2 weeks.

## 2014-09-17 NOTE — Telephone Encounter (Signed)
rx sent to mail order 

## 2014-09-17 NOTE — Telephone Encounter (Signed)
Pt daughter states that the patient needs a refill on [place] to [facility] online refill

## 2014-10-02 ENCOUNTER — Encounter: Payer: Self-pay | Admitting: Family Medicine

## 2014-10-02 ENCOUNTER — Ambulatory Visit (INDEPENDENT_AMBULATORY_CARE_PROVIDER_SITE_OTHER): Payer: Medicare Other | Admitting: Family

## 2014-10-02 ENCOUNTER — Ambulatory Visit (INDEPENDENT_AMBULATORY_CARE_PROVIDER_SITE_OTHER): Payer: Medicare Other | Admitting: Family Medicine

## 2014-10-02 VITALS — BP 132/82 | HR 61 | Temp 97.5°F | Wt 194.0 lb

## 2014-10-02 DIAGNOSIS — I482 Chronic atrial fibrillation, unspecified: Secondary | ICD-10-CM

## 2014-10-02 DIAGNOSIS — N3941 Urge incontinence: Secondary | ICD-10-CM

## 2014-10-02 DIAGNOSIS — Z5181 Encounter for therapeutic drug level monitoring: Secondary | ICD-10-CM | POA: Diagnosis not present

## 2014-10-02 DIAGNOSIS — R4189 Other symptoms and signs involving cognitive functions and awareness: Secondary | ICD-10-CM

## 2014-10-02 DIAGNOSIS — E785 Hyperlipidemia, unspecified: Secondary | ICD-10-CM

## 2014-10-02 LAB — POCT INR: INR: 2.1

## 2014-10-02 MED ORDER — MIRABEGRON ER 50 MG PO TB24: 50.0000 mg | ORAL_TABLET | Freq: Every day | ORAL | Status: DC

## 2014-10-02 MED ORDER — ROSUVASTATIN CALCIUM 40 MG PO TABS: 40.0000 mg | ORAL_TABLET | Freq: Every day | ORAL | Status: DC

## 2014-10-02 MED ORDER — ESCITALOPRAM OXALATE 10 MG PO TABS: 10.0000 mg | ORAL_TABLET | Freq: Every day | ORAL | Status: DC

## 2014-10-02 MED ORDER — MEMANTINE HCL 28 X 5 MG & 21 X 10 MG PO TABS: ORAL_TABLET | ORAL | Status: DC

## 2014-10-02 NOTE — Progress Notes (Signed)
Subjective:    Patient ID: Angel Meyer, female    DOB: 07-14-1933, 81 y.o.   MRN: [id number]  HPI The following issues were addressed today:  Urinary urgency. We started Myrbetriq last visit 25 mg once daily. She saw slight improvement still gets up about 2 times at night. She questions whether week at increased dosage. No side effects.  Cognitive impairment. Recent Mini-Mental status exam 25 out of 30. She is currently on Aricept. They have not seen any improvement but no worsening. We discussed possible addition of Namenda  Atrial fibrillation history. On Coumadin. No falls. No recent bleeding complications.  Past Medical History  Diagnosis Date  . Arthritis   . Persistent atrial fibrillation     failed on Flecainide and Tikosyn  . Renal calculi   . Hypertension   . Hyperlipidemia   . Rheumatic fever     age 31  . History of blood transfusion   . Incontinence of urine   . Diastolic dysfunction     preserved EF,  CHF In setting of afib previously  . Osteoporosis   . OSA (obstructive sleep apnea)     uses CPAP  . Restless legs   . Systolic heart failure     EF 30 to 35% per echo 08/2013  . Dysrhythmia     afib   Past Surgical History  Procedure Laterality Date  . Breast surgery      benign biopsy  . Tonsillectomy    . Abdominal hysterectomy    . Spine surgery      laminectomy 1965, spinal fusion with rod 2005  . Replacement total knee  2005  . Kidney stone surgery      1997 removed  . Cardioversion N/A 09/10/2013    Procedure: CARDIOVERSION;  Surgeon: Thompson Grayer, MD;  Location: [place];  Service: Cardiovascular;  Laterality: N/A;  . Cardioversion N/A 11/16/2013    Procedure: CARDIOVERSION;  Surgeon: Sueanne Margarita, MD;  Location: [facility] ENDOSCOPY;  Service: Cardiovascular;  Laterality: N/A;    reports that she has never smoked. She does not have any smokeless tobacco history on file. She reports that she drinks alcohol. She reports that she does not use illicit  drugs. family history includes Cancer in her mother and paternal grandfather; Diabetes in her mother. Allergies  Allergen Reactions  . Ciprofloxacin Nausea And Vomiting  . Demerol [Meperidine] Nausea And Vomiting  . Sulfa Antibiotics Nausea And Vomiting      Review of Systems  Constitutional: Negative for fatigue.  Eyes: Negative for visual disturbance.  Respiratory: Negative for cough, chest tightness, shortness of breath and wheezing.   Cardiovascular: Negative for chest pain, palpitations and leg swelling.  Neurological: Negative for dizziness, seizures, syncope, weakness, light-headedness and headaches.  Hematological: Does not bruise/bleed easily.  Psychiatric/Behavioral: Positive for dysphoric mood. Negative for suicidal ideas.       Objective:   Physical Exam  Constitutional: She appears well-developed and well-nourished.  Neck: Neck supple.  Cardiovascular: Normal rate and regular rhythm.   Pulmonary/Chest: Effort normal and breath sounds normal. No respiratory distress. She has no wheezes. She has no rales.  Musculoskeletal: She exhibits no edema.  Psychiatric: She has a normal mood and affect. Her behavior is normal.          Assessment & Plan:  #1 urinary urgency. Improved but still having some issues. Titrate Myrbetriq to 50 mg once daily. Reassess one month #2 cognitive impairment. Suspect early dementia. Start Namenda starter pack and reassess  one month and check MMSE then #3 atrial fibrillation. Appears to be in sinus rhythm today. INR stable and at goal at 2.1

## 2014-10-02 NOTE — Progress Notes (Signed)
Pre visit review using our clinic review tool, if applicable. No additional management support is needed unless otherwise documented below in the visit note. 

## 2014-10-02 NOTE — Patient Instructions (Signed)
Continue 1 tablet daily except 2 tablets on Sunday/Wednesday. Re-check in 4 weeks.   Anticoagulation Dose Instructions as of 10/02/2014      Dorene Grebe Tue Wed Thu Fri Sat   New Dose 5 mg 2.5 mg 2.5 mg 5 mg 2.5 mg 2.5 mg 2.5 mg    Description        Continue 1 tablet daily except 2 tablets on Sunday/Wednesday. Re-check in 4 weeks.

## 2014-10-03 ENCOUNTER — Ambulatory Visit: Payer: Medicare Other | Admitting: Family

## 2014-10-08 ENCOUNTER — Ambulatory Visit: Payer: Medicare Other | Admitting: Nurse Practitioner

## 2014-10-09 DIAGNOSIS — H2511 Age-related nuclear cataract, right eye: Secondary | ICD-10-CM | POA: Diagnosis not present

## 2014-10-10 ENCOUNTER — Encounter (HOSPITAL_COMMUNITY): Payer: Self-pay | Admitting: Cardiology

## 2014-10-16 ENCOUNTER — Ambulatory Visit (INDEPENDENT_AMBULATORY_CARE_PROVIDER_SITE_OTHER): Payer: Medicare Other | Admitting: Nurse Practitioner

## 2014-10-16 ENCOUNTER — Encounter: Payer: Self-pay | Admitting: Nurse Practitioner

## 2014-10-16 VITALS — BP 158/90 | HR 60 | Ht 61.0 in | Wt 199.8 lb

## 2014-10-16 DIAGNOSIS — I4891 Unspecified atrial fibrillation: Secondary | ICD-10-CM | POA: Diagnosis not present

## 2014-10-16 DIAGNOSIS — I5032 Chronic diastolic (congestive) heart failure: Secondary | ICD-10-CM | POA: Diagnosis not present

## 2014-10-16 DIAGNOSIS — R0600 Dyspnea, unspecified: Secondary | ICD-10-CM

## 2014-10-16 DIAGNOSIS — E785 Hyperlipidemia, unspecified: Secondary | ICD-10-CM

## 2014-10-16 DIAGNOSIS — Z79899 Other long term (current) drug therapy: Secondary | ICD-10-CM | POA: Diagnosis not present

## 2014-10-16 DIAGNOSIS — I480 Paroxysmal atrial fibrillation: Secondary | ICD-10-CM

## 2014-10-16 DIAGNOSIS — Z7901 Long term (current) use of anticoagulants: Secondary | ICD-10-CM | POA: Diagnosis not present

## 2014-10-16 LAB — CBC
HCT: 43.7 % (ref 36.0–46.0)
Hemoglobin: 14.9 g/dL (ref 12.0–15.0)
MCHC: 34.2 g/dL (ref 30.0–36.0)
MCV: 95.3 fl (ref 78.0–100.0)
Platelets: 126 10*3/uL — ABNORMAL LOW (ref 150.0–400.0)
RBC: 4.59 Mil/uL (ref 3.87–5.11)
RDW: 14.9 % (ref 11.5–15.5)
WBC: 5.7 10*3/uL (ref 4.0–10.5)

## 2014-10-16 LAB — BASIC METABOLIC PANEL
BUN: 27 mg/dL — ABNORMAL HIGH (ref 6–23)
CO2: 27 mEq/L (ref 19–32)
Calcium: 9.3 mg/dL (ref 8.4–10.5)
Chloride: 110 mEq/L (ref 96–112)
Creatinine, Ser: 0.86 mg/dL (ref 0.40–1.20)
GFR: 67.59 mL/min (ref >60.00)
Glucose, Bld: 89 mg/dL (ref 70–99)
Potassium: 4.7 mEq/L (ref 3.5–5.1)
Sodium: 142 mEq/L (ref 135–145)

## 2014-10-16 LAB — LIPID PANEL
Cholesterol: 192 mg/dL (ref 0–200)
HDL: 83.9 mg/dL (ref >39.00)
LDL Cholesterol: 90 mg/dL (ref 0–99)
NonHDL: 108.1
Total CHOL/HDL Ratio: 2
Triglycerides: 91 mg/dL (ref 0.0–149.0)
VLDL: 18.2 mg/dL (ref 0.0–40.0)

## 2014-10-16 LAB — HEPATIC FUNCTION PANEL
ALT: 13 U/L (ref 0–35)
AST: 19 U/L (ref 0–37)
Albumin: 4.1 g/dL (ref 3.5–5.2)
Alkaline Phosphatase: 52 U/L (ref 39–117)
Bilirubin, Direct: 0.2 mg/dL (ref 0.0–0.3)
Total Bilirubin: 0.9 mg/dL (ref 0.2–1.2)
Total Protein: 6.6 g/dL (ref 6.0–8.3)

## 2014-10-16 LAB — BRAIN NATRIURETIC PEPTIDE: Pro B Natriuretic peptide (BNP): 175 pg/mL — ABNORMAL HIGH (ref 0.0–100.0)

## 2014-10-16 LAB — TSH: TSH: 1.9 u[IU]/mL (ref 0.35–4.50)

## 2014-10-16 MED ORDER — AMIODARONE HCL 200 MG PO TABS: 200.0000 mg | ORAL_TABLET | Freq: Every day | ORAL | Status: DC

## 2014-10-16 MED ORDER — SPIRONOLACTONE 25 MG PO TABS: 25.0000 mg | ORAL_TABLET | Freq: Every day | ORAL | Status: DC

## 2014-10-16 NOTE — Patient Instructions (Addendum)
We will be checking the following labs today BMET, CBC, TSH, Lipids, HPF and BNP  Monitor your blood pressure - call us if consistently staying above 150 on the top number  Try to be more active  Try to restrict your salt more  I have refilled the aldactone and amiodarone  We need to get your breathing test scheduled (I have released the order) per Dr. Elsworth Soho  I will see you in 4 months  Call the [place] office at [phone] if you have any questions, problems or concerns.

## 2014-10-16 NOTE — Progress Notes (Signed)
CARDIOLOGY OFFICE NOTE  Date:  10/16/2014  +-  Angel Meyer Date of Birth: Feb 05, 1933 Medical Record #[id number]  PCP:  [name], MD  Cardiologist:  Dr. Rayann Heman    Chief Complaint  Patient presents with  . Atrial Fibrillation  . Hypertension    4 month check - seen for Dr. Rayann Heman.   . Fatigue     History of Present Illness: Angel Meyer is a 81 y.o. female who presents today for a 4 month check. She has PAF, rheumatic fever at age 10, HTN and OSA. She has a lung nodule that is followed by pulmonary which resolved.  In December of 2014 she presented with progressive dyspnea, orthopnea and cough for 6 to 8 weeks - noted to be in AF with RVR and volume overload. She was treated with flecainide which was unsuccessful. Then placed on Tikosyn and was cardioverted - this also unsuccessful. Now managed with rate control and anticoagulation. Seen by PCCM for her cough/congestion and was to discuss possible amiodarone therapy which was subsequently started. Echo showed her EF down to 30 to 35%. We proceeded on with cardioversion which was successful. She was bradycardic following the procedure and her Coreg and Diltiazem were stopped.   I was able to review some of her records from [place] received on 11/06/13 from Dr. Kerry Dory from [place] - I can see where she was put on amiodarone back in 2012 - then her dose was cut back and then stopped in May of 2013 along with discontinuation of her Coreg - not clear to me as to why. Looks like there was some shortness of breath and fatigue noted and low HR. Her diagnosis was always listed as "paroxysmal AF" and not chronic. Fatigue and DOE seemed to be a prevalent complaint.   I saw her back in March of 2015. She was felt to be doing well.  Amiodarone was cut back to her maintenance dose. Really had had no improvement in her symptoms with restoration of sinus rhythm.   She has also been to the ER at the end of March with chest pressure  and noted to be in atrial fib with RVR - seen by Dr. Rayann Heman as well with recommendations as follows - Angel Meyer presents with symptomatic atrial fibrillation. She is now back in SR. She does not appear volume overloaded on exam and her CXR is clear. ProBNP only minimally elevated. At this time she is stable for discharge home to follow-up in the office as scheduled with Truitt Merle, NP in 3 weeks. She will continue her current medication regimen, including amiodarone 200 mg once daily. She does not need any AV nodal agents on a daily basis. Dr. Rayann Heman gave instructions for management of atrial fibrillation in the outpatient setting as long as she is stable. If she feels she is in afib and pulse rate >100 bpm, she was instructed to take ONE additional dose of amiodarone (200 mg for one dose only). Also, she was instructed to take short acting diltiazem 30 mg every 6 hours as needed for pulse >100 bpm when in afib. Angel Meyer and her daughter expressed verbal understanding and agree with this plan of care.   Signed,  Ileene Hutchinson, PA-C  12/20/2013, 2:23 PM  I have seen, examined the patient, and reviewed the above assessment and plan. Changes to above are made where necessary. The patient is well known to me. Her afib has been rather difficult to treat. She has  done better recently with amiodarone therapy. She developed abrupt onset of afib earlier today. She has converted back to sinus and now appears to be back to her baseline. I think that we should resume her prior regimen. IF she has further afib, she can take additional amiodarone as well as cardizem for rate control. (Given prior baseline sinus bradycardia I will not start daily rate control but will use cardizem as needed). OK to discharge with outpatient followup.  Co Sign: Thompson Grayer, MD  12/20/2013  10:54 PM   I last saw her in September of 2015 - she was stable from our standpoint. Had been started on Aricept by PCP. Going thru lots of  adjustments with moving here permanently. Echo updated back in May and showed improvement in her EF.   Comes back today. Here with her daughter. Now permanently here. Seems depressed. Not very active. Not short of breath. No chest pain. Weight is up 11 pounds. Probably getting too much salt as well. Her dose of Myrbetriq has been increased. BP up some as well. Now on Namenda in addition to her Aricept. Not really using her Lasix that is just "prn". Rhythm has been ok. She admits she is fatigued. Having cataract surgery later this month. No problems with her coumadin - monitored by PCP. Does not look like she has had her PFTs done. She is not dizzy or lightheaded. No syncope reported.   Past Medical History  Diagnosis Date  . Arthritis   . Persistent atrial fibrillation     failed on Flecainide and Tikosyn  . Renal calculi   . Hypertension   . Hyperlipidemia   . Rheumatic fever     age 82  . History of blood transfusion   . Incontinence of urine   . Diastolic dysfunction     preserved EF,  CHF In setting of afib previously  . Osteoporosis   . OSA (obstructive sleep apnea)     uses CPAP  . Restless legs   . Systolic heart failure     EF 30 to 35% per echo 08/2013  . Dysrhythmia     afib    Past Surgical History  Procedure Laterality Date  . Breast surgery      benign biopsy  . Tonsillectomy    . Abdominal hysterectomy    . Spine surgery      laminectomy 1965, spinal fusion with rod 2005  . Replacement total knee  2005  . Kidney stone surgery      1997 removed  . Cardioversion N/A 09/10/2013    Procedure: CARDIOVERSION;  Surgeon: Thompson Grayer, MD;  Location: [place];  Service: Cardiovascular;  Laterality: N/A;  . Cardioversion N/A 11/16/2013    Procedure: CARDIOVERSION;  Surgeon: Sueanne Margarita, MD;  Location: [facility] ENDOSCOPY;  Service: Cardiovascular;  Laterality: N/A;     Medications: Current Outpatient Prescriptions  Medication Sig Dispense Refill  . acetaminophen (TYLENOL)  650 MG CR tablet Take 650 mg by mouth every 8 (eight) hours as needed for pain.     Marland Kitchen amiodarone (PACERONE) 200 MG tablet Take 1 tablet (200 mg total) by mouth daily. 90 tablet 3  . Cholecalciferol (VITAMIN D3) 2000 UNITS TABS Take 2,000 Units by mouth daily. Once a day    . diltiazem (CARDIZEM) 30 MG tablet Take only as needed. AFib w/ pulse >100 bpm, take 30 mg (1 tab) by mouth once. May repeat 30 mg (1 tab) every 6 hours PRN for rate >100 bpm. 30  tablet 3  . donepezil (ARICEPT ODT) 10 MG disintegrating tablet Take 1 tablet (10 mg total) by mouth at bedtime. 30 tablet 11  . escitalopram (LEXAPRO) 10 MG tablet Take 1 tablet (10 mg total) by mouth daily. 90 tablet 3  . furosemide (LASIX) 40 MG tablet Take 0.5 tablets (20 mg total) by mouth daily. 30 tablet 4  . memantine (NAMENDA TITRATION PAK) tablet pack 5 mg/day for =1 week; 5 mg twice daily for =1 week; 15 mg/day given in 5 mg and 10 mg separated doses for =1 week; then 10 mg twice daily 49 tablet 12  . mirabegron ER (MYRBETRIQ) 50 MG TB24 tablet Take 1 tablet (50 mg total) by mouth daily. 30 tablet 11  . Multiple Vitamins-Minerals (VITEYES AREDS ADVANCED PO) Take by mouth.    . nitroGLYCERIN (NITROSTAT) 0.4 MG SL tablet Place 1 tablet (0.4 mg total) under the tongue every 5 (five) minutes as needed for chest pain. 25 tablet 3  . Omega-3 Fatty Acids (FISH OIL) 1200 MG CAPS Take 1,200 mg by mouth 2 (two) times daily.     . potassium chloride SA (K-DUR,KLOR-CON) 20 MEQ tablet Take 1 tablet (20 mEq total) by mouth daily. 30 tablet 4  . rOPINIRole (REQUIP) 1 MG tablet Take 1 tablet (1 mg total) by mouth 2 (two) times daily. 180 tablet 2  . rosuvastatin (CRESTOR) 40 MG tablet Take 1 tablet (40 mg total) by mouth daily. 90 tablet 3  . spironolactone (ALDACTONE) 25 MG tablet Take 1 tablet (25 mg total) by mouth daily. 90 tablet 3  . warfarin (COUMADIN) 2.5 MG tablet Take as directed by anticoagulation clinic 120 tablet 0  . [DISCONTINUED] oxybutynin  (DITROPAN-XL) 10 MG 24 hr tablet Take 10 mg by mouth daily.     No current facility-administered medications for this visit.   Facility-Administered Medications Ordered in Other Visits  Medication Dose Route Frequency Provider Last Rate Last Dose  . propofol (DIPRIVAN) 10 mg/mL bolus/IV push    Anesthesia Intra-op Scheryl Darter, CRNA   30 mg at 11/16/13 1232    Allergies: Allergies  Allergen Reactions  . Ciprofloxacin Nausea And Vomiting  . Demerol [Meperidine] Nausea And Vomiting  . Sulfa Antibiotics Nausea And Vomiting    Social History: The patient  reports that she has never smoked. She does not have any smokeless tobacco history on file. She reports that she drinks alcohol. She reports that she does not use illicit drugs.   Family History: The patient's family history includes Cancer in her mother and paternal grandfather; Diabetes in her mother.   Review of Systems: Please see the history of present illness.   Otherwise, the review of systems is positive for visual disturbance from a cataract on the right and fatigue.   All other systems are reviewed and negative.   Physical Exam: VS:  BP 158/90 mmHg  Pulse 60  Ht 5\' 1"  (1.549 m)  Wt 199 lb 12.8 oz (90.629 kg)  BMI 37.77 kg/m2  SpO2 95% .  BMI Body mass index is 37.77 kg/(m^2).  Repeat BP by me is 150/70. Her weight is up 11 pounds since I last saw her.  General: Pleasant. Well developed, well nourished and in no acute distress.  HEENT: Normal. Neck: Supple, no JVD, carotid bruits, or masses noted.  Cardiac: Regular rate and rhythm. No murmurs, rubs, or gallops. Legs are quite fleshy with trace edema. Respiratory:  Lungs are clear to auscultation bilaterally with normal work of breathing.  GI: Soft and nontender.  MS: No deformity or atrophy. Gait and ROM intact. Skin: Warm and dry. Color is normal.  Neuro:  Strength and sensation are intact and no gross focal deficits noted.  Psych: Alert, appropriate and with normal  affect.   Wt Readings from Last 3 Encounters:  10/16/14 199 lb 12.8 oz (90.629 kg)  10/02/14 194 lb (87.998 kg)  07/18/14 192 lb (87.091 kg)    LABORATORY DATA:  EKG:  EKG is ordered today. This shows sinus bradycardia. This tracing has been reviewed with Dr. Rayann Heman. No AV block.   Lab Results  Component Value Date   WBC 6.0 06/11/2014   HGB 15.6* 06/11/2014   HCT 45.5 06/11/2014   PLT 128.0* 06/11/2014   GLUCOSE 85 06/11/2014   CHOL 223* 01/29/2014   TRIG 87.0 01/29/2014   HDL 87.10 01/29/2014   LDLCALC 119* 01/29/2014   ALT 16 06/11/2014   AST 25 06/11/2014   NA 138 06/11/2014   K 4.1 06/11/2014   CL 104 06/11/2014   CREATININE 1.1 06/11/2014   BUN 23 06/11/2014   CO2 24 06/11/2014   TSH 1.67 05/30/2014   INR 2.1 10/02/2014    BNP (last 3 results)  Recent Labs  12/20/13 1055  PROBNP 1618.0*    Other Studies Reviewed Today:  Myoview Impression from February 2015  Exercise Capacity: [place] with no exercise.  BP Response: Hypotensive blood pressure response.  Clinical Symptoms: There is chest heaviness.  ECG Impression: No significant ST segment change suggestive of ischemia.  Comparison with Prior Nuclear Study: No images to compare  Overall Impression: Normal stress nuclear study.  LV Ejection Fraction: Study not gated. LV Wall Motion: NA  Kirk Ruths    Echo Study Conclusions from May 2015  - Left ventricle: The cavity size was normal. Systolic function was normal. The estimated ejection fraction was in the range of 60% to 65%. Wall motion was normal; there were no regional wall motion abnormalities. Doppler parameters are consistent with abnormal left ventricular relaxation (grade 1 diastolic dysfunction). Doppler parameters are consistent with elevated ventricular end-diastolic filling pressure. - Aortic valve: Trileaflet; moderately thickened, moderately calcified leaflets. Transvalvular velocity was within  the normal range. There was no stenosis. No regurgitation. - Mitral valve: Calcified annulus. Mildly thickened leaflets . Mild regurgitation. - Left atrium: The atrium was moderately dilated. - Right ventricle: Systolic function was normal. - Tricuspid valve: No regurgitation. - Pulmonary arteries: Systolic pressure was within the normal range. Impressions:  - Normal biventricular function, no wall motion abnormalities. Abnormal relaxation with elevated filling pressures. Mild mitral regurgitation.  Assessment / Plan:  1. Atrial fib - on amiodarone - now s/p cardioversion - holding in sinus by exam and by EKG. She is bradycardic - this may be making her fatigued but I suspect that the fatigue is more multifactorial. She is on amiodarone and on Aricept which could contribute to her bradycardia. Needs follow up labs today. I have discussed her case with Dr. Rayann Heman - he is comfortable with our current plan of care. See back in 4 months with EKG  2. LV Dysfunction - looks compensated. Weight is up but I suspect this is from her sedentary lifestyle. She is asymptomatic. Check BNP. Last EF normal.   3. Chronic anticoagulation - no problems noted.   4. Satisfactory recent Myoview without ischemia   5. HTN - BP 150/80 by me today. May be due to the recent increase in her Myrbetriq. Family will get a large  cuff and monitor for me. Would consider adding HCTZ if needed.   Current medicines are reviewed with the patient today.  The patient does not have concerns regarding medicines.  The following changes have been made:  See above  Labs/ tests ordered today include:    Orders Placed This Encounter  Procedures  . Basic metabolic panel  . CBC  . Hepatic function panel  . TSH  . Lipid panel  . Brain natriuretic peptide  . EKG 12-Lead     Disposition:   FU with me in 4 months with EKG  Patient is agreeable to this plan and will call if any problems develop in the interim.    Signed: Burtis Junes, RN, ANP-C 10/16/2014 9:09 AM  [facility] [address] [place], [place]  [address] Phone: [phone] Fax: [phone]

## 2014-10-21 DIAGNOSIS — H25811 Combined forms of age-related cataract, right eye: Secondary | ICD-10-CM | POA: Diagnosis not present

## 2014-10-21 DIAGNOSIS — H2511 Age-related nuclear cataract, right eye: Secondary | ICD-10-CM | POA: Diagnosis not present

## 2014-10-29 ENCOUNTER — Ambulatory Visit (INDEPENDENT_AMBULATORY_CARE_PROVIDER_SITE_OTHER): Payer: Medicare Other | Admitting: Family Medicine

## 2014-10-29 ENCOUNTER — Other Ambulatory Visit: Payer: Self-pay | Admitting: Family Medicine

## 2014-10-29 ENCOUNTER — Encounter: Payer: Self-pay | Admitting: Family Medicine

## 2014-10-29 ENCOUNTER — Other Ambulatory Visit: Payer: Self-pay | Admitting: Family

## 2014-10-29 ENCOUNTER — Ambulatory Visit (INDEPENDENT_AMBULATORY_CARE_PROVIDER_SITE_OTHER): Payer: Medicare Other | Admitting: Family

## 2014-10-29 VITALS — BP 130/74 | HR 60 | Temp 97.9°F | Wt 197.0 lb

## 2014-10-29 DIAGNOSIS — I482 Chronic atrial fibrillation, unspecified: Secondary | ICD-10-CM

## 2014-10-29 DIAGNOSIS — R4189 Other symptoms and signs involving cognitive functions and awareness: Secondary | ICD-10-CM | POA: Diagnosis not present

## 2014-10-29 DIAGNOSIS — Z5181 Encounter for therapeutic drug level monitoring: Secondary | ICD-10-CM

## 2014-10-29 DIAGNOSIS — R3915 Urgency of urination: Secondary | ICD-10-CM

## 2014-10-29 LAB — POCT INR: INR: 3.2

## 2014-10-29 MED ORDER — MEMANTINE HCL 10 MG PO TABS: 10.0000 mg | ORAL_TABLET | Freq: Two times a day (BID) | ORAL | Status: DC

## 2014-10-29 NOTE — Progress Notes (Signed)
Subjective:    Patient ID: Angel Meyer, female    DOB: [birth year], 81 y.o.   MRN: [id number]  HPI  Patient seen regarding cognitive impairment. Last fall she had Mini-Mental status exam 25 out of 30. She has been on Aricept. We added Namenda. Subjectively did not see much change either worse or better. She's not any side effects from medication. She's had good appetite. No recent nausea or vomiting. She has atrial fibrillation on Coumadin. No recent bleeding issues. Multiple other medications are reviewed. Mood has been stable. No recent falls. She is somewhat lightheaded today.  Urine urgency. Previously took Ditropan and had extreme dry mouth. We switched her to [facility] which she has tolerated well but she's not seen much improvement symptomatically  Past Medical History  Diagnosis Date  . Arthritis   . Persistent atrial fibrillation     failed on Flecainide and Tikosyn  . Renal calculi   . Hypertension   . Hyperlipidemia   . Rheumatic fever     age 36  . History of blood transfusion   . Incontinence of urine   . Diastolic dysfunction     preserved EF,  CHF In setting of afib previously  . Osteoporosis   . OSA (obstructive sleep apnea)     uses CPAP  . Restless legs   . Systolic heart failure     EF 30 to 35% per echo 08/2013  . Dysrhythmia     afib   Past Surgical History  Procedure Laterality Date  . Breast surgery      benign biopsy  . Tonsillectomy    . Abdominal hysterectomy    . Spine surgery      laminectomy 1965, spinal fusion with rod 2005  . Replacement total knee  2005  . Kidney stone surgery      1997 removed  . Cardioversion N/A 09/10/2013    Procedure: CARDIOVERSION;  Surgeon: Thompson Grayer, MD;  Location: [place];  Service: Cardiovascular;  Laterality: N/A;  . Cardioversion N/A 11/16/2013    Procedure: CARDIOVERSION;  Surgeon: Sueanne Margarita, MD;  Location: [facility] ENDOSCOPY;  Service: Cardiovascular;  Laterality: N/A;    reports that she has never smoked.  She does not have any smokeless tobacco history on file. She reports that she drinks alcohol. She reports that she does not use illicit drugs. family history includes Cancer in her mother and paternal grandfather; Diabetes in her mother. Allergies  Allergen Reactions  . Ciprofloxacin Nausea And Vomiting  . Demerol [Meperidine] Nausea And Vomiting  . Sulfa Antibiotics Nausea And Vomiting      Review of Systems  Constitutional: Negative for fatigue.  Eyes: Negative for visual disturbance.  Respiratory: Negative for cough, chest tightness, shortness of breath and wheezing.   Cardiovascular: Negative for chest pain, palpitations and leg swelling.  Neurological: Positive for light-headedness. Negative for dizziness, seizures, syncope, weakness and headaches.       Objective:   Physical Exam  Constitutional: She is oriented to person, place, and time. She appears well-developed.  Cardiovascular: Normal rate.   Pulmonary/Chest: Effort normal and breath sounds normal. No respiratory distress. She has no wheezes. She has no rales.  Neurological: She is alert and oriented to person, place, and time. No cranial nerve deficit.  Psychiatric: She has a normal mood and affect. Her behavior is normal.  MMSE 28 out of 30          Assessment & Plan:  #1 cognitive impairment. She is actually improved  from 25/30 to 28/30. Recent addition of Namenda. Refill prescription for 10 mg twice daily. Continue Aricept. Routine follow-up in 6 months. Repeat MMSE then #2 urinary urgency. Unchanged. She thinks she had better improvement with Oxybutinin but had extreme dry mouth. Continue with Myrbetriq for now

## 2014-10-29 NOTE — Patient Instructions (Signed)
Hold Coumadin today only. Then continue 1 tablet daily except 2 tablets on Sunday/Wednesday. Re-check in 4 weeks.   Anticoagulation Dose Instructions as of 10/29/2014      Dorene Grebe Tue Wed Thu Fri Sat   New Dose 5 mg 2.5 mg 2.5 mg 5 mg 2.5 mg 2.5 mg 2.5 mg    Description        Hold Coumadin today only. Then continue 1 tablet daily except 2 tablets on Sunday/Wednesday. Re-check in 4 weeks.

## 2014-10-29 NOTE — Progress Notes (Signed)
Pre visit review using our clinic review tool, if applicable. No additional management support is needed unless otherwise documented below in the visit note. 

## 2014-11-04 ENCOUNTER — Encounter (INDEPENDENT_AMBULATORY_CARE_PROVIDER_SITE_OTHER): Payer: Medicare Other | Admitting: Pulmonary Disease

## 2014-11-04 DIAGNOSIS — I482 Chronic atrial fibrillation, unspecified: Secondary | ICD-10-CM

## 2014-11-04 LAB — PULMONARY FUNCTION TEST
DL/VA % pred: 97 %
DL/VA: 4.14 ml/min/mmHg/L
DLCO UNC: 16.08 ml/min/mmHg
DLCO unc % pred: 85 %
FEF 25-75 PRE: 1.34 L/s
FEF 25-75 Post: 1.94 L/sec
FEF2575-%Change-Post: 44 %
FEF2575-%PRED-POST: 158 %
FEF2575-%Pred-Pre: 109 %
FEV1-%Change-Post: 7 %
FEV1-%PRED-POST: 114 %
FEV1-%PRED-PRE: 106 %
FEV1-PRE: 1.69 L
FEV1-Post: 1.83 L
FEV1FVC-%Change-Post: 10 %
FEV1FVC-%Pred-Pre: 103 %
FEV6-%Change-Post: 0 %
FEV6-%PRED-PRE: 107 %
FEV6-%Pred-Post: 107 %
FEV6-POST: 2.17 L
FEV6-PRE: 2.19 L
FEV6FVC-%Change-Post: 1 %
FEV6FVC-%PRED-PRE: 104 %
FEV6FVC-%Pred-Post: 106 %
FVC-%Change-Post: -2 %
FVC-%Pred-Post: 100 %
FVC-%Pred-Pre: 102 %
FVC-Post: 2.18 L
FVC-Pre: 2.22 L
POST FEV6/FVC RATIO: 100 %
PRE FEV6/FVC RATIO: 99 %
Post FEV1/FVC ratio: 84 %
Pre FEV1/FVC ratio: 76 %
RV % pred: 88 %
RV: 1.93 L
TLC % pred: 94 %
TLC: 4.2 L

## 2014-11-14 ENCOUNTER — Ambulatory Visit (INDEPENDENT_AMBULATORY_CARE_PROVIDER_SITE_OTHER): Payer: Medicare Other | Admitting: Family Medicine

## 2014-11-14 ENCOUNTER — Encounter: Payer: Self-pay | Admitting: Family Medicine

## 2014-11-14 VITALS — BP 132/80 | HR 63 | Temp 97.5°F | Wt 198.0 lb

## 2014-11-14 DIAGNOSIS — R05 Cough: Secondary | ICD-10-CM | POA: Diagnosis not present

## 2014-11-14 DIAGNOSIS — R350 Frequency of micturition: Secondary | ICD-10-CM | POA: Diagnosis not present

## 2014-11-14 DIAGNOSIS — H109 Unspecified conjunctivitis: Secondary | ICD-10-CM | POA: Diagnosis not present

## 2014-11-14 DIAGNOSIS — R059 Cough, unspecified: Secondary | ICD-10-CM

## 2014-11-14 LAB — POCT URINALYSIS DIPSTICK
Bilirubin, UA: NEGATIVE
Blood, UA: NEGATIVE
Glucose, UA: NEGATIVE
Ketones, UA: NEGATIVE
Nitrite, UA: NEGATIVE
SPEC GRAV UA: 1.015
Urobilinogen, UA: 1
pH, UA: 5

## 2014-11-14 MED ORDER — CEPHALEXIN 500 MG PO CAPS: 500.0000 mg | ORAL_CAPSULE | Freq: Three times a day (TID) | ORAL | Status: DC

## 2014-11-14 MED ORDER — TOBRAMYCIN 0.3 % OP SOLN: 2.0000 [drp] | OPHTHALMIC | Status: DC

## 2014-11-14 NOTE — Progress Notes (Signed)
Subjective:    Patient ID: Angel Meyer, female    DOB: December 16, 1932, 81 y.o.   MRN: [id number]  HPI Acute visit. Patient has multiple chronic problems including history of urinary urgency, restless leg syndrome, osteoarthritis, obstructive sleep apnea, hypertension, hyperlipidemia, history of kidney stones, cognitive impairment, CHF, atrial fibrillation Seen for the following acute issues today  2 day history of increased urine frequency. Possibly more cloudy urine. Denies any burning with urination. No fevers or chills. She has urinary urgency and takes Myrbetriq. She still has some leakage issues at times and sounds like has some component of stress incontinence as well. Denies any flank pain  She has occasional dry cough over the past several days. Recent URI type symptoms. Several members at home and had similar symptoms. No fever. No productive cough. No hemoptysis.  She's had some recent redness involving the right eye. Minimal drainage early morning. No blurred vision. She has some minimal soreness and puffiness below the eye. Denies any recent eye injury. She did have recent cataract surgery.  Past Medical History  Diagnosis Date  . Arthritis   . Persistent atrial fibrillation     failed on Flecainide and Tikosyn  . Renal calculi   . Hypertension   . Hyperlipidemia   . Rheumatic fever     age 39  . History of blood transfusion   . Incontinence of urine   . Diastolic dysfunction     preserved EF,  CHF In setting of afib previously  . Osteoporosis   . OSA (obstructive sleep apnea)     uses CPAP  . Restless legs   . Systolic heart failure     EF 30 to 35% per echo 08/2013  . Dysrhythmia     afib   Past Surgical History  Procedure Laterality Date  . Breast surgery      benign biopsy  . Tonsillectomy    . Abdominal hysterectomy    . Spine surgery      laminectomy 1965, spinal fusion with rod 2005  . Replacement total knee  2005  . Kidney stone surgery      1997  removed  . Cardioversion N/A 09/10/2013    Procedure: CARDIOVERSION;  Surgeon: Thompson Grayer, MD;  Location: [place];  Service: Cardiovascular;  Laterality: N/A;  . Cardioversion N/A 11/16/2013    Procedure: CARDIOVERSION;  Surgeon: Sueanne Margarita, MD;  Location: [facility] ENDOSCOPY;  Service: Cardiovascular;  Laterality: N/A;    reports that she has never smoked. She does not have any smokeless tobacco history on file. She reports that she drinks alcohol. She reports that she does not use illicit drugs. family history includes Cancer in her mother and paternal grandfather; Diabetes in her mother. Allergies  Allergen Reactions  . Ciprofloxacin Nausea And Vomiting  . Demerol [Meperidine] Nausea And Vomiting  . Sulfa Antibiotics Nausea And Vomiting      Review of Systems  Constitutional: Positive for fatigue. Negative for fever and chills.  Eyes: Positive for discharge and redness. Negative for visual disturbance.  Respiratory: Positive for cough. Negative for shortness of breath and wheezing.   Cardiovascular: Negative for chest pain.  Gastrointestinal: Negative for nausea, vomiting and abdominal pain.  Genitourinary: Positive for urgency and frequency. Negative for hematuria.       Objective:   Physical Exam  Constitutional: She appears well-developed and well-nourished.  HENT:  Right Ear: External ear normal.  Left Ear: External ear normal.  Eyes:  Right conjunctiva slightly erythematous compared to  the left. She has some minimal crusted drainage on the inner canthus of the right.  Neck: Neck supple.  Cardiovascular: Normal rate.   Pulmonary/Chest: Effort normal and breath sounds normal. No respiratory distress. She has no wheezes. She has no rales.  Neurological: She is alert.          Assessment & Plan:  #1 cough. Nonfocal exam. Suspect viral process. Follow-up promptly for fever or worsening symptoms #2 urine frequency. Urine dipstick is equivocal. 1+ leukocytes. Urine culture  sent. Keflex 500 mg 3 times a day for 7 days pending culture results. She has long-standing history of urine urgency and probably some stress incontinence as well. This makes assessment difficult. #3 suspect early bacterial conjunctivitis right eye. Tobrex solution 2 drops right eye every 4 hours while awake

## 2014-11-14 NOTE — Progress Notes (Signed)
Pre visit review using our clinic review tool, if applicable. No additional management support is needed unless otherwise documented below in the visit note. 

## 2014-11-17 ENCOUNTER — Encounter: Payer: Self-pay | Admitting: Family Medicine

## 2014-11-17 LAB — URINE CULTURE

## 2014-11-28 ENCOUNTER — Ambulatory Visit (INDEPENDENT_AMBULATORY_CARE_PROVIDER_SITE_OTHER): Payer: Medicare Other | Admitting: General Practice

## 2014-11-28 DIAGNOSIS — Z5181 Encounter for therapeutic drug level monitoring: Secondary | ICD-10-CM

## 2014-11-28 LAB — POCT INR: INR: 1.6

## 2014-11-28 NOTE — Progress Notes (Signed)
Pre visit review using our clinic review tool, if applicable. No additional management support is needed unless otherwise documented below in the visit note. 

## 2014-12-12 ENCOUNTER — Ambulatory Visit (INDEPENDENT_AMBULATORY_CARE_PROVIDER_SITE_OTHER): Payer: Medicare Other | Admitting: General Practice

## 2014-12-12 DIAGNOSIS — Z5181 Encounter for therapeutic drug level monitoring: Secondary | ICD-10-CM | POA: Diagnosis not present

## 2014-12-12 LAB — POCT INR: INR: 3.3

## 2014-12-12 NOTE — Progress Notes (Signed)
Pre visit review using our clinic review tool, if applicable. No additional management support is needed unless otherwise documented below in the visit note. 

## 2014-12-24 DIAGNOSIS — F329 Major depressive disorder, single episode, unspecified: Secondary | ICD-10-CM | POA: Diagnosis not present

## 2014-12-24 DIAGNOSIS — F419 Anxiety disorder, unspecified: Secondary | ICD-10-CM | POA: Diagnosis not present

## 2014-12-24 DIAGNOSIS — Z886 Allergy status to analgesic agent status: Secondary | ICD-10-CM | POA: Diagnosis not present

## 2014-12-24 DIAGNOSIS — E780 Pure hypercholesterolemia: Secondary | ICD-10-CM | POA: Diagnosis not present

## 2014-12-24 DIAGNOSIS — N3090 Cystitis, unspecified without hematuria: Secondary | ICD-10-CM | POA: Diagnosis not present

## 2014-12-24 DIAGNOSIS — I10 Essential (primary) hypertension: Secondary | ICD-10-CM | POA: Diagnosis not present

## 2014-12-24 DIAGNOSIS — Z87440 Personal history of urinary (tract) infections: Secondary | ICD-10-CM | POA: Diagnosis not present

## 2014-12-24 DIAGNOSIS — Z90710 Acquired absence of both cervix and uterus: Secondary | ICD-10-CM | POA: Diagnosis not present

## 2014-12-24 DIAGNOSIS — Z79899 Other long term (current) drug therapy: Secondary | ICD-10-CM | POA: Diagnosis not present

## 2014-12-24 DIAGNOSIS — I509 Heart failure, unspecified: Secondary | ICD-10-CM | POA: Diagnosis not present

## 2014-12-24 DIAGNOSIS — Z882 Allergy status to sulfonamides status: Secondary | ICD-10-CM | POA: Diagnosis not present

## 2015-01-09 ENCOUNTER — Ambulatory Visit (INDEPENDENT_AMBULATORY_CARE_PROVIDER_SITE_OTHER): Payer: Medicare Other | Admitting: General Practice

## 2015-01-09 DIAGNOSIS — Z5181 Encounter for therapeutic drug level monitoring: Secondary | ICD-10-CM

## 2015-01-09 LAB — POCT INR: INR: 2.3

## 2015-01-09 NOTE — Progress Notes (Signed)
Pre visit review using our clinic review tool, if applicable. No additional management support is needed unless otherwise documented below in the visit note. 

## 2015-01-22 ENCOUNTER — Telehealth: Payer: Self-pay | Admitting: Family Medicine

## 2015-01-22 NOTE — Telephone Encounter (Signed)
Pt would like a referral to urologist for bladder leakage. Pt also needs refill on furosemide 40 mg #90 w/refills sent to [employer] order

## 2015-01-23 ENCOUNTER — Other Ambulatory Visit: Payer: Self-pay | Admitting: Family Medicine

## 2015-01-23 DIAGNOSIS — R32 Unspecified urinary incontinence: Secondary | ICD-10-CM

## 2015-01-23 MED ORDER — FUROSEMIDE 40 MG PO TABS
20.0000 mg | ORAL_TABLET | Freq: Every day | ORAL | Status: DC
Start: 2015-01-23 — End: 2015-01-28

## 2015-01-23 NOTE — Telephone Encounter (Signed)
Refills OK and OK to set up Urology referral.

## 2015-01-23 NOTE — Telephone Encounter (Signed)
Referral is ordered. Rx sent to mail order.

## 2015-01-28 ENCOUNTER — Other Ambulatory Visit: Payer: Self-pay | Admitting: Family Medicine

## 2015-02-12 ENCOUNTER — Ambulatory Visit (INDEPENDENT_AMBULATORY_CARE_PROVIDER_SITE_OTHER): Payer: Medicare Other | Admitting: Nurse Practitioner

## 2015-02-12 ENCOUNTER — Ambulatory Visit: Payer: Medicare Other | Admitting: Nurse Practitioner

## 2015-02-12 ENCOUNTER — Encounter: Payer: Self-pay | Admitting: Nurse Practitioner

## 2015-02-12 VITALS — BP 126/68 | HR 68 | Ht 62.0 in | Wt 201.8 lb

## 2015-02-12 DIAGNOSIS — Z79899 Other long term (current) drug therapy: Secondary | ICD-10-CM

## 2015-02-12 DIAGNOSIS — I480 Paroxysmal atrial fibrillation: Secondary | ICD-10-CM | POA: Diagnosis not present

## 2015-02-12 LAB — BASIC METABOLIC PANEL
BUN: 21 mg/dL (ref 6–23)
CO2: 27 mEq/L (ref 19–32)
Calcium: 10.3 mg/dL (ref 8.4–10.5)
Chloride: 104 mEq/L (ref 96–112)
Creatinine, Ser: 0.96 mg/dL (ref 0.40–1.20)
GFR: 59.48 mL/min — ABNORMAL LOW (ref >60.00)
Glucose, Bld: 77 mg/dL (ref 70–99)
Potassium: 4 mEq/L (ref 3.5–5.1)
Sodium: 139 mEq/L (ref 135–145)

## 2015-02-12 LAB — CBC
HCT: 44.6 % (ref 36.0–46.0)
Hemoglobin: 15.3 g/dL — ABNORMAL HIGH (ref 12.0–15.0)
MCHC: 34.3 g/dL (ref 30.0–36.0)
MCV: 95.4 fl (ref 78.0–100.0)
Platelets: 147 10*3/uL — ABNORMAL LOW (ref 150.0–400.0)
RBC: 4.67 Mil/uL (ref 3.87–5.11)
RDW: 14.7 % (ref 11.5–15.5)
WBC: 6.2 10*3/uL (ref 4.0–10.5)

## 2015-02-12 LAB — HEPATIC FUNCTION PANEL
ALT: 16 U/L (ref 0–35)
AST: 22 U/L (ref 0–37)
Albumin: 4.5 g/dL (ref 3.5–5.2)
Alkaline Phosphatase: 52 U/L (ref 39–117)
Bilirubin, Direct: 0.3 mg/dL (ref 0.0–0.3)
Total Bilirubin: 1.7 mg/dL — ABNORMAL HIGH (ref 0.2–1.2)
Total Protein: 7.3 g/dL (ref 6.0–8.3)

## 2015-02-12 LAB — TSH: TSH: 2.33 u[IU]/mL (ref 0.35–4.50)

## 2015-02-12 MED ORDER — POTASSIUM CHLORIDE CRYS ER 20 MEQ PO TBCR: 20.0000 meq | EXTENDED_RELEASE_TABLET | Freq: Every day | ORAL | Status: DC | PRN

## 2015-02-12 MED ORDER — SPIRONOLACTONE 25 MG PO TABS: 25.0000 mg | ORAL_TABLET | Freq: Every day | ORAL | Status: DC

## 2015-02-12 MED ORDER — AMIODARONE HCL 200 MG PO TABS: 200.0000 mg | ORAL_TABLET | Freq: Every day | ORAL | Status: DC

## 2015-02-12 NOTE — Progress Notes (Signed)
CARDIOLOGY OFFICE NOTE  Date:  02/12/2015    Angel Meyer Date of Birth: June 06, 1933 Medical Record #[id number]  PCP:  Angel Post, MD  Cardiologist:  Angel Meyer  Chief Complaint  Patient presents with  . Atrial Fibrillation    Follow up visit -seen for Dr. Rayann Meyer    History of Present Illness: Angel Meyer is a 81 y.o. female who presents today for a follow up visit. Seen for Dr. Rayann Meyer. She has PAF, rheumatic fever at age 80, HTN and OSA. She has a lung nodule that is followed by pulmonary which resolved.  In December of 2014 she presented with progressive dyspnea, orthopnea and cough for 6 to 8 weeks - noted to be in AF with RVR and volume overload. She was treated with flecainide which was unsuccessful. Then placed on Tikosyn and was cardioverted - this also unsuccessful. Now managed with rate control and anticoagulation. Seen by PCCM for her cough/congestion and was to discuss possible amiodarone therapy which was subsequently started. Echo showed her EF down to 30 to 35%. We proceeded on with cardioversion which was successful. She was bradycardic following the procedure and her Coreg and Diltiazem were stopped.   I was able to review some of her records from [place] received on 11/06/13 from Dr. Kerry Meyer from [place] - I can see where she was put on amiodarone back in 2012 - then her dose was cut back and then stopped in May of 2013 along with discontinuation of her Coreg - not clear to me as to why. Looks like there was some shortness of breath and fatigue noted and low HR. Her diagnosis was always listed as "paroxysmal AF" and not chronic. Fatigue and DOE seemed to be a prevalent complaint.   I saw her back in March of 2015. She was felt to be doing well. Amiodarone was cut back to her maintenance dose. Really had had no improvement in her symptoms with restoration of sinus rhythm.   She has also been to the ER at the end of March of 2015 with chest pressure and noted  to be in atrial fib with RVR - seen by Dr. Rayann Meyer as well with recommendations as follows - Angel Meyer presents with symptomatic atrial fibrillation. She is now back in SR. She does not appear volume overloaded on exam and her CXR is clear. ProBNP only minimally elevated. At this time she is stable for discharge home to follow-up in the office as scheduled with Angel Merle, NP in 3 weeks. She will continue her current medication regimen, including amiodarone 200 mg once daily. She does not need any AV nodal agents on a daily basis. Dr. Rayann Meyer gave instructions for management of atrial fibrillation in the outpatient setting as long as she is stable. If she feels she is in afib and pulse rate >100 bpm, she was instructed to take ONE additional dose of amiodarone (200 mg for one dose only). Also, she was instructed to take short acting diltiazem 30 mg every 6 hours as needed for pulse >100 bpm when in afib. Angel Meyer and her daughter expressed verbal understanding and agree with this plan of care.   I last saw her in January of 2016 - she was stable from our standpoint. Had been started on Aricept by PCP. Going thru lots of adjustments with moving here permanently. Seemed depressed at last OV.   Comes back today. Here with her daughter. Doing ok for the most part. Pretty sedentary. Got  a FitBit for Christmas - she barely gets in 1000 steps per day. Wakes up in the morning and has chills. Daughter notes she seems to have more cold intolerance. Breathing is stable. No chest pain. Rarely taking lasix/potassium due to urinary frequency. Seeing GU next month. Some dizziness - sounds vague. No syncope. Daughter notes that she does tend to miss once or twice a week her morning cardiac medicines. Continues with memory issues.   Past Medical History  Diagnosis Date  . Arthritis   . Persistent atrial fibrillation     failed on Flecainide and Tikosyn  . Renal calculi   . Hypertension   . Hyperlipidemia   .  Rheumatic fever     age 14  . History of blood transfusion   . Incontinence of urine   . Diastolic dysfunction     preserved EF,  CHF In setting of afib previously  . Osteoporosis   . OSA (obstructive sleep apnea)     uses CPAP  . Restless legs   . Systolic heart failure     EF 30 to 35% per echo 08/2013  . Dysrhythmia     afib    Past Surgical History  Procedure Laterality Date  . Breast surgery      benign biopsy  . Tonsillectomy    . Abdominal hysterectomy    . Spine surgery      laminectomy 1965, spinal fusion with rod 2005  . Replacement total knee  2005  . Kidney stone surgery      1997 removed  . Cardioversion N/A 09/10/2013    Procedure: CARDIOVERSION;  Surgeon: Angel Grayer, MD;  Location: [place];  Service: Cardiovascular;  Laterality: N/A;  . Cardioversion N/A 11/16/2013    Procedure: CARDIOVERSION;  Surgeon: Angel Margarita, MD;  Location: [facility] ENDOSCOPY;  Service: Cardiovascular;  Laterality: N/A;     Medications: Current Outpatient Prescriptions  Medication Sig Dispense Refill  . acetaminophen (TYLENOL) 650 MG CR tablet Take 650 mg by mouth every 8 (eight) hours as needed for pain.     Marland Kitchen amiodarone (PACERONE) 200 MG tablet Take 1 tablet (200 mg total) by mouth daily. 90 tablet 3  . Cholecalciferol (VITAMIN D3) 2000 UNITS TABS Take 2,000 Units by mouth daily. Once a day    . diltiazem (CARDIZEM) 30 MG tablet Take only as needed. AFib w/ pulse >100 bpm, take 30 mg (1 tab) by mouth once. May repeat 30 mg (1 tab) every 6 hours PRN for rate >100 bpm. 30 tablet 3  . donepezil (ARICEPT ODT) 10 MG disintegrating tablet Take 1 tablet (10 mg total) by mouth at bedtime. 30 tablet 11  . escitalopram (LEXAPRO) 10 MG tablet Take 1 tablet (10 mg total) by mouth daily. 90 tablet 3  . furosemide (LASIX) 40 MG tablet TAKE 1 TABLET EVERY DAY (Patient taking differently: taking daily prn) 90 tablet 2  . memantine (NAMENDA) 10 MG tablet TAKE 1 TABLET BY MOUTH TWICE DAILY 180 tablet 5    . mirabegron ER (MYRBETRIQ) 50 MG TB24 tablet Take 1 tablet (50 mg total) by mouth daily. 30 tablet 11  . Multiple Vitamins-Minerals (VITEYES AREDS ADVANCED PO) Take by mouth.    . nitroGLYCERIN (NITROSTAT) 0.4 MG SL tablet Place 1 tablet (0.4 mg total) under the tongue every 5 (five) minutes as needed for chest pain. 25 tablet 3  . potassium chloride SA (K-DUR,KLOR-CON) 20 MEQ tablet Take 1 tablet (20 mEq total) by mouth daily as needed. Takes on  days that she takes Lasix 30 tablet 4  . rOPINIRole (REQUIP) 1 MG tablet Take 1 tablet (1 mg total) by mouth 2 (two) times daily. 180 tablet 2  . rosuvastatin (CRESTOR) 40 MG tablet Take 1 tablet (40 mg total) by mouth daily. 90 tablet 3  . spironolactone (ALDACTONE) 25 MG tablet Take 1 tablet (25 mg total) by mouth daily. 90 tablet 3  . warfarin (COUMADIN) 2.5 MG tablet TAKE AS DIRECTED BY ANTICOAGULATION CLINIC 120 tablet 0   No current facility-administered medications for this visit.   Facility-Administered Medications Ordered in Other Visits  Medication Dose Route Frequency Provider Last Rate Last Dose  . propofol (DIPRIVAN) 10 mg/mL bolus/IV push    Anesthesia Intra-op Scheryl Darter, CRNA   30 mg at 11/16/13 1232    Allergies: Allergies  Allergen Reactions  . Ciprofloxacin Nausea And Vomiting  . Demerol [Meperidine] Nausea And Vomiting  . Sulfa Antibiotics Nausea And Vomiting    Social History: The patient  reports that she has never smoked. She does not have any smokeless tobacco history on file. She reports that she drinks alcohol. She reports that she does not use illicit drugs.   Family History: The patient's family history includes Cancer in her mother and paternal grandfather; Diabetes in her mother.   Review of Systems: Please see the history of present illness.   All other systems are reviewed and negative.   Physical Exam: VS:  BP 126/68 mmHg  Pulse 68  Ht 5\' 2"  (1.575 m)  Wt 201 lb 12.8 oz (91.536 kg)  BMI 36.90 kg/m2  .  BMI Body mass index is 36.9 kg/(m^2).  Wt Readings from Last 3 Encounters:  02/12/15 201 lb 12.8 oz (91.536 kg)  11/14/14 198 lb (89.812 kg)  10/29/14 197 lb (89.359 kg)    General: Pleasant. Well developed, well nourished and in no acute distress.  HEENT: Normal. Neck: Supple, no JVD, carotid bruits, or masses noted.  Cardiac: Regular rate and rhythm. No murmurs, rubs, or gallops. No edema.  Respiratory:  Lungs are clear to auscultation bilaterally with normal work of breathing.  GI: Soft and nontender.  MS: No deformity or atrophy. Gait and ROM intact. Skin: Warm and dry. Color is normal.  Neuro:  Strength and sensation are intact and no gross focal deficits noted.  Psych: Alert, appropriate and with normal affect.   LABORATORY DATA:  EKG:  EKG is not ordered today.  Lab Results  Component Value Date   WBC 5.7 10/16/2014   HGB 14.9 10/16/2014   HCT 43.7 10/16/2014   PLT 126.0* 10/16/2014   GLUCOSE 89 10/16/2014   CHOL 192 10/16/2014   TRIG 91.0 10/16/2014   HDL 83.90 10/16/2014   LDLCALC 90 10/16/2014   ALT 13 10/16/2014   AST 19 10/16/2014   NA 142 10/16/2014   K 4.7 10/16/2014   CL 110 10/16/2014   CREATININE 0.86 10/16/2014   BUN 27* 10/16/2014   CO2 27 10/16/2014   TSH 1.90 10/16/2014   INR 2.3 01/09/2015    BNP (last 3 results) No results for input(s): BNP in the last 8760 hours.  ProBNP (last 3 results)  Recent Labs  10/16/14 0925  PROBNP 175.0*     Other Studies Reviewed Today:  Myoview Impression from February 2015  Exercise Capacity: [place] with no exercise.  BP Response: Hypotensive blood pressure response.  Clinical Symptoms: There is chest heaviness.  ECG Impression: No significant ST segment change suggestive of ischemia.  Comparison  with Prior Nuclear Study: No images to compare  Overall Impression: Normal stress nuclear study.  LV Ejection Fraction: Study not gated. LV Wall Motion: NA  Kirk Ruths    Echo Study  Conclusions from May 2015  - Left ventricle: The cavity size was normal. Systolic function was normal. The estimated ejection fraction was in the range of 60% to 65%. Wall motion was normal; there were no regional wall motion abnormalities. Doppler parameters are consistent with abnormal left ventricular relaxation (grade 1 diastolic dysfunction). Doppler parameters are consistent with elevated ventricular end-diastolic filling pressure. - Aortic valve: Trileaflet; moderately thickened, moderately calcified leaflets. Transvalvular velocity was within the normal range. There was no stenosis. No regurgitation. - Mitral valve: Calcified annulus. Mildly thickened leaflets . Mild regurgitation. - Left atrium: The atrium was moderately dilated. - Right ventricle: Systolic function was normal. - Tricuspid valve: No regurgitation. - Pulmonary arteries: Systolic pressure was within the normal range. Impressions:  - Normal biventricular function, no wall motion abnormalities. Abnormal relaxation with elevated filling pressures. Mild mitral regurgitation.  Assessment / Plan:  1. Atrial fib - on amiodarone - now s/p cardioversion - holding in sinus by exam. Needs recheck of her surveillance labs. I have left her on her current regimen.   2. LV Dysfunction - looks compensated. No change in current regimen. Using Lasix just prn.   3. Chronic anticoagulation - no problems noted.   4. HTN - BP ok on current regimen.  5. Memory issues  6. Cold intolerance - checking labs today.   Current medicines are reviewed with the patient today.  The patient does not have concerns regarding medicines other than what has been noted above.  The following changes have been made:  See above.  Labs/ tests ordered today include:    Orders Placed This Encounter  Procedures  . Basic metabolic panel  . CBC  . Hepatic function panel  . TSH     Disposition:   FU with me in 6  months.   Patient is agreeable to this plan and will call if any problems develop in the interim.   Signed: Burtis Junes, RN, ANP-C 02/12/2015 11:10 AM  [facility] [address] [place], [place]  [address] Phone: [phone] Fax: [phone]

## 2015-02-12 NOTE — Patient Instructions (Signed)
.  We will be checking the following labs today - BMET, CBC, TSH and HPF   Medication Instructions:    Continue with your current medicines.   I have refilled the Potassium, amiodarone, and aldactone today    Testing/Procedures To Be Arranged:  N/A  Follow-Up:   I will see you back in 6 months    Other Special Instructions:   N/A  Call the [place] office at [phone] if you have any questions, problems or concerns.

## 2015-02-13 ENCOUNTER — Ambulatory Visit (INDEPENDENT_AMBULATORY_CARE_PROVIDER_SITE_OTHER): Payer: Medicare Other | Admitting: General Practice

## 2015-02-13 ENCOUNTER — Other Ambulatory Visit: Payer: Self-pay | Admitting: General Practice

## 2015-02-13 ENCOUNTER — Other Ambulatory Visit: Payer: Self-pay | Admitting: Family Medicine

## 2015-02-13 DIAGNOSIS — Z5181 Encounter for therapeutic drug level monitoring: Secondary | ICD-10-CM | POA: Diagnosis not present

## 2015-02-13 DIAGNOSIS — I4891 Unspecified atrial fibrillation: Secondary | ICD-10-CM

## 2015-02-13 LAB — POCT INR: INR: 1.5

## 2015-02-13 MED ORDER — WARFARIN SODIUM 2.5 MG PO TABS: ORAL_TABLET | ORAL | Status: DC

## 2015-02-13 NOTE — Progress Notes (Signed)
Pre visit review using our clinic review tool, if applicable. No additional management support is needed unless otherwise documented below in the visit note. 

## 2015-02-17 ENCOUNTER — Other Ambulatory Visit: Payer: Self-pay | Admitting: General Practice

## 2015-02-18 ENCOUNTER — Telehealth: Payer: Self-pay | Admitting: Nurse Practitioner

## 2015-02-18 NOTE — Telephone Encounter (Signed)
New Message   Patients daughter is returning nurses call. Please give patient a call.

## 2015-02-27 DIAGNOSIS — N3941 Urge incontinence: Secondary | ICD-10-CM | POA: Diagnosis not present

## 2015-02-27 DIAGNOSIS — Z0000 Encounter for general adult medical examination without abnormal findings: Secondary | ICD-10-CM | POA: Diagnosis not present

## 2015-02-27 DIAGNOSIS — N816 Rectocele: Secondary | ICD-10-CM | POA: Diagnosis not present

## 2015-02-27 DIAGNOSIS — N393 Stress incontinence (female) (male): Secondary | ICD-10-CM | POA: Diagnosis not present

## 2015-03-06 ENCOUNTER — Ambulatory Visit (INDEPENDENT_AMBULATORY_CARE_PROVIDER_SITE_OTHER): Payer: Medicare Other | Admitting: General Practice

## 2015-03-06 DIAGNOSIS — Z5181 Encounter for therapeutic drug level monitoring: Secondary | ICD-10-CM | POA: Diagnosis not present

## 2015-03-06 DIAGNOSIS — I4891 Unspecified atrial fibrillation: Secondary | ICD-10-CM | POA: Diagnosis not present

## 2015-03-06 LAB — POCT INR: INR: 2.1

## 2015-03-06 NOTE — Progress Notes (Signed)
Pre visit review using our clinic review tool, if applicable. No additional management support is needed unless otherwise documented below in the visit note. 

## 2015-03-17 DIAGNOSIS — N3020 Other chronic cystitis without hematuria: Secondary | ICD-10-CM | POA: Diagnosis not present

## 2015-03-17 DIAGNOSIS — N3941 Urge incontinence: Secondary | ICD-10-CM | POA: Diagnosis not present

## 2015-04-07 ENCOUNTER — Ambulatory Visit: Payer: Medicare Other

## 2015-04-10 DIAGNOSIS — H35363 Drusen (degenerative) of macula, bilateral: Secondary | ICD-10-CM | POA: Diagnosis not present

## 2015-04-10 DIAGNOSIS — H25812 Combined forms of age-related cataract, left eye: Secondary | ICD-10-CM | POA: Diagnosis not present

## 2015-04-14 ENCOUNTER — Ambulatory Visit (INDEPENDENT_AMBULATORY_CARE_PROVIDER_SITE_OTHER): Payer: Medicare Other | Admitting: General Practice

## 2015-04-14 DIAGNOSIS — I4891 Unspecified atrial fibrillation: Secondary | ICD-10-CM | POA: Diagnosis not present

## 2015-04-14 DIAGNOSIS — Z5181 Encounter for therapeutic drug level monitoring: Secondary | ICD-10-CM

## 2015-04-14 LAB — POCT INR: INR: 1.8

## 2015-04-14 NOTE — Progress Notes (Signed)
Pre visit review using our clinic review tool, if applicable. No additional management support is needed unless otherwise documented below in the visit note. 

## 2015-04-28 DIAGNOSIS — N3020 Other chronic cystitis without hematuria: Secondary | ICD-10-CM | POA: Diagnosis not present

## 2015-04-28 DIAGNOSIS — N3941 Urge incontinence: Secondary | ICD-10-CM | POA: Diagnosis not present

## 2015-05-06 ENCOUNTER — Ambulatory Visit (INDEPENDENT_AMBULATORY_CARE_PROVIDER_SITE_OTHER): Payer: Medicare Other | Admitting: Adult Health

## 2015-05-06 ENCOUNTER — Encounter: Payer: Self-pay | Admitting: Adult Health

## 2015-05-06 DIAGNOSIS — L259 Unspecified contact dermatitis, unspecified cause: Secondary | ICD-10-CM

## 2015-05-06 MED ORDER — MICONAZOLE POWD: Status: DC

## 2015-05-06 MED ORDER — TRIAMCINOLONE ACETONIDE 0.1 % EX CREA: 1.0000 "application " | TOPICAL_CREAM | Freq: Two times a day (BID) | CUTANEOUS | Status: DC

## 2015-05-06 NOTE — Patient Instructions (Signed)
It was great meeting you today!  Please apply the cream twice a day (morning and night) for the next week.   Use the powder as needed.   If you do not notice any improvement, please let me know.

## 2015-05-06 NOTE — Progress Notes (Signed)
Subjective:    Patient ID: Angel Meyer, female    DOB: Apr 09, 1933, 81 y.o.   MRN: [id number]  HPI  81 year old female, patient of Dr. Erick Blinks presents today for complaint of red rash on right upper arm. She first noticed this rash about two weeks ago, has tried prescription Desoximetasone cream, and 1% Eucerin cream, without any relief. She applied  Miconazole powder today which helped with the rash.She only complains of itching. No pain, burning or discharge from rash.   Review of Systems  Constitutional: Negative.   Skin: Positive for rash.  Allergic/Immunologic: Negative.    Past Medical History  Diagnosis Date  . Arthritis   . Persistent atrial fibrillation     failed on Flecainide and Tikosyn  . Renal calculi   . Hypertension   . Hyperlipidemia   . Rheumatic fever     age 41  . History of blood transfusion   . Incontinence of urine   . Diastolic dysfunction     preserved EF,  CHF In setting of afib previously  . Osteoporosis   . OSA (obstructive sleep apnea)     uses CPAP  . Restless legs   . Systolic heart failure     EF 30 to 35% per echo 08/2013  . Dysrhythmia     afib    History   Social History  . Marital Status: Divorced    Spouse Name: N/A  . Number of Children: N/A  . Years of Education: N/A   Occupational History  . Not on file.   Social History Main Topics  . Smoking status: Never Smoker   . Smokeless tobacco: Not on file  . Alcohol Use: Yes     Comment: glass of wine each day  . Drug Use: No  . Sexual Activity: Not Currently   Other Topics Concern  . Not on file   Social History Narrative   Lives in [place] alone.  Spends 4-6 months per year in [place] with her daughter.   Retired Network engineer    Past Surgical History  Procedure Laterality Date  . Breast surgery      benign biopsy  . Tonsillectomy    . Abdominal hysterectomy    . Spine surgery      laminectomy 1965, spinal fusion with rod 2005  . Replacement total knee  2005   . Kidney stone surgery      1997 removed  . Cardioversion N/A 09/10/2013    Procedure: CARDIOVERSION;  Surgeon: Thompson Grayer, MD;  Location: [facility] OR;  Service: Cardiovascular;  Laterality: N/A;  . Cardioversion N/A 11/16/2013    Procedure: CARDIOVERSION;  Surgeon: Sueanne Margarita, MD;  Location: [facility] ENDOSCOPY;  Service: Cardiovascular;  Laterality: N/A;    Family History  Problem Relation Age of Onset  . Cancer Mother     breast  . Diabetes Mother   . Cancer Paternal Grandfather     Allergies  Allergen Reactions  . Ciprofloxacin Nausea And Vomiting  . Demerol [Meperidine] Nausea And Vomiting  . Sulfa Antibiotics Nausea And Vomiting    Current Outpatient Prescriptions on File Prior to Visit  Medication Sig Dispense Refill  . acetaminophen (TYLENOL) 650 MG CR tablet Take 650 mg by mouth every 8 (eight) hours as needed for pain.     Marland Kitchen amiodarone (PACERONE) 200 MG tablet Take 1 tablet (200 mg total) by mouth daily. 90 tablet 3  . Cholecalciferol (VITAMIN D3) 2000 UNITS TABS Take 2,000 Units by mouth daily.  Once a day    . diltiazem (CARDIZEM) 30 MG tablet Take only as needed. AFib w/ pulse >100 bpm, take 30 mg (1 tab) by mouth once. May repeat 30 mg (1 tab) every 6 hours PRN for rate >100 bpm. 30 tablet 3  . donepezil (ARICEPT ODT) 10 MG disintegrating tablet Take 1 tablet (10 mg total) by mouth at bedtime. 30 tablet 11  . escitalopram (LEXAPRO) 10 MG tablet Take 1 tablet (10 mg total) by mouth daily. 90 tablet 3  . furosemide (LASIX) 40 MG tablet TAKE 1 TABLET EVERY DAY (Patient taking differently: taking daily prn) 90 tablet 2  . memantine (NAMENDA) 10 MG tablet TAKE 1 TABLET BY MOUTH TWICE DAILY 180 tablet 5  . mirabegron ER (MYRBETRIQ) 50 MG TB24 tablet Take 1 tablet (50 mg total) by mouth daily. 30 tablet 11  . Multiple Vitamins-Minerals (VITEYES AREDS ADVANCED PO) Take by mouth.    . nitroGLYCERIN (NITROSTAT) 0.4 MG SL tablet Place 1 tablet (0.4 mg total) under the tongue every 5  (five) minutes as needed for chest pain. 25 tablet 3  . potassium chloride SA (K-DUR,KLOR-CON) 20 MEQ tablet Take 1 tablet (20 mEq total) by mouth daily as needed. Takes on days that she takes Lasix 30 tablet 4  . rOPINIRole (REQUIP) 1 MG tablet Take 1 tablet (1 mg total) by mouth 2 (two) times daily. 180 tablet 2  . rosuvastatin (CRESTOR) 40 MG tablet Take 1 tablet (40 mg total) by mouth daily. 90 tablet 3  . spironolactone (ALDACTONE) 25 MG tablet Take 1 tablet (25 mg total) by mouth daily. 90 tablet 3  . warfarin (COUMADIN) 2.5 MG tablet Take as directed by anticoagulation clinic 180 tablet 1  . warfarin (COUMADIN) 2.5 MG tablet Take as directed by anticoagulation clinic 30 tablet 0   Current Facility-Administered Medications on File Prior to Visit  Medication Dose Route Frequency Provider Last Rate Last Dose  . propofol (DIPRIVAN) 10 mg/mL bolus/IV push    Anesthesia Intra-op Scheryl Darter, CRNA   30 mg at 11/16/13 1232    There were no vitals taken for this visit.        Objective:   Physical Exam  Constitutional: She is oriented to person, place, and time. She appears well-developed and well-nourished. No distress.  Musculoskeletal: Normal range of motion. She exhibits no edema or tenderness.  Neurological: She is alert and oriented to person, place, and time.  Skin: Skin is warm and dry. Rash (She does not have any specific rash but does have a couple excoriations on her right forearm.) noted. She is not diaphoretic. No pallor.  Psychiatric: She has a normal mood and affect. Her behavior is normal. Judgment and thought content normal.  Nursing note and vitals reviewed.      Assessment & Plan:  1. Contact dermatitis - triamcinolone cream (KENALOG) 0.1 %; Apply 1 application topically 2 (two) times daily.  Dispense: 30 g; Refill: 0 for one week - Miconazole POWD; Apply to affected area  Dispense: 25 g; Refill: 0 - Follow up if no improvement in 2-3 days.

## 2015-05-19 ENCOUNTER — Ambulatory Visit (INDEPENDENT_AMBULATORY_CARE_PROVIDER_SITE_OTHER): Payer: Medicare Other | Admitting: General Practice

## 2015-05-19 DIAGNOSIS — Z5181 Encounter for therapeutic drug level monitoring: Secondary | ICD-10-CM | POA: Diagnosis not present

## 2015-05-19 LAB — POCT INR: INR: 2.7

## 2015-05-19 NOTE — Progress Notes (Signed)
Pre visit review using our clinic review tool, if applicable. No additional management support is needed unless otherwise documented below in the visit note. 

## 2015-05-25 DIAGNOSIS — M25552 Pain in left hip: Secondary | ICD-10-CM | POA: Diagnosis not present

## 2015-05-27 ENCOUNTER — Ambulatory Visit (INDEPENDENT_AMBULATORY_CARE_PROVIDER_SITE_OTHER): Payer: Medicare Other | Admitting: Family Medicine

## 2015-05-27 ENCOUNTER — Encounter: Payer: Self-pay | Admitting: Family Medicine

## 2015-05-27 VITALS — BP 120/84 | HR 56 | Temp 98.3°F | Wt 204.0 lb

## 2015-05-27 DIAGNOSIS — M25512 Pain in left shoulder: Secondary | ICD-10-CM | POA: Diagnosis not present

## 2015-05-27 DIAGNOSIS — R21 Rash and other nonspecific skin eruption: Secondary | ICD-10-CM

## 2015-05-27 DIAGNOSIS — M25552 Pain in left hip: Secondary | ICD-10-CM | POA: Diagnosis not present

## 2015-05-27 MED ORDER — METHYLPREDNISOLONE ACETATE 80 MG/ML IJ SUSP
80.0000 mg | Freq: Once | INTRAMUSCULAR | Status: AC
  Administered 2015-05-27: 80 mg via INTRAMUSCULAR

## 2015-05-27 NOTE — Patient Instructions (Signed)
Rotator Cuff Tendinitis  Rotator cuff tendinitis is inflammation of the tough, cord-like bands that connect muscle to bone (tendons) in your rotator cuff. Your rotator cuff is the collection of all the muscles and tendons that connect your arm to your shoulder. Your rotator cuff holds the head of your upper arm bone (humerus) in the cup (fossa) of your shoulder blade (scapula). CAUSES Rotator cuff tendinitis is usually caused by overusing the joint involved.  SIGNS AND SYMPTOMS  Deep ache in the shoulder also felt on the outside upper arm over the shoulder muscle.  Point tenderness over the area that is injured.  Pain comes on gradually and becomes worse with lifting the arm to the side (abduction) or turning it inward (internal rotation).  May lead to a chronic tear: When a rotator cuff tendon becomes inflamed, it runs the risk of losing its blood supply, causing some tendon fibers to die. This increases the risk that the tendon can fray and partially or completely tear. DIAGNOSIS Rotator cuff tendinitis is diagnosed by taking a medical history, performing a physical exam, and reviewing results of imaging exams. The medical history is useful to help determine the type of rotator cuff injury. The physical exam will include looking at the injured shoulder, feeling the injured area, and watching you do range-of-motion exercises. X-ray exams are typically done to rule out other causes of shoulder pain, such as fractures. MRI is the imaging exam usually used for significant shoulder injuries. Sometimes a dye study called CT arthrogram is done, but it is not as widely used as MRI. In some institutions, special ultrasound tests may also be used to aid in the diagnosis. TREATMENT  Less Severe Cases  Use of a sling to rest the shoulder for a short period of time. Prolonged use of the sling can cause stiffness, weakness, and loss of motion of the shoulder joint.  Anti-inflammatory medicines, such as  ibuprofen or naproxen sodium, may be prescribed. More Severe Cases  Physical therapy.  Use of steroid injections into the shoulder joint.  Surgery. HOME CARE INSTRUCTIONS   Use a sling or splint until the pain decreases. Prolonged use of the sling can cause stiffness, weakness, and loss of motion of the shoulder joint.  Apply ice to the injured area:  Put ice in a plastic bag.  Place a towel between your skin and the bag.  Leave the ice on for 20 minutes, 2-3 times a day.  Try to avoid use other than gentle range of motion while your shoulder is painful. Use the shoulder and exercise only as directed by your health care provider. Stop exercises or range of motion if pain or discomfort increases, unless directed otherwise by your health care provider.  Only take over-the-counter or prescription medicines for pain, discomfort, or fever as directed by your health care provider.  If you were given a shoulder sling and straps (immobilizer), do not remove it except as directed, or until you see a health care provider for a follow-up exam. If you need to remove it, move your arm as little as possible or as directed.  You may want to sleep on several pillows at night to lessen swelling and pain. SEEK IMMEDIATE MEDICAL CARE IF:   Your shoulder pain increases or new pain develops in your arm, hand, or fingers and is not relieved with medicines.  You have new, unexplained symptoms, especially increased numbness in the hands or loss of strength.  You develop any worsening of the problems  that brought you in for care.  Your arm, hand, or fingers are numb or tingling.  Your arm, hand, or fingers are swollen, painful, or turn white or blue. MAKE SURE YOU:  Understand these instructions.  Will watch your condition.  Will get help right away if you are not doing well or get worse. Document Released: 12/04/2003 Document Revised: 07/04/2013 Document Reviewed: 04/25/2013 [facility] Patient  Information 2015 [place], [place]. This information is not intended to replace advice given to you by your health care provider. Make sure you discuss any questions you have with your health care provider.  Try adding moisturizing cream such as Lac-Hydrin and combined 50-50 with triamcinolone cream and use twice daily Avoid scratching rash is much as possible Try to obtain x-rays from recent urgent care visit Work on range of motion for left shoulder

## 2015-05-27 NOTE — Progress Notes (Signed)
Subjective:    Patient ID: Angel Meyer, female    DOB: 01-23-1933, 81 y.o.   MRN: [id number]  HPI Patient seen for medical follow-up. She has chronic problems including obesity, urinary urgency, recurrent cystitis, restless leg syndrome, hyperlipidemia, hypertension, atrial fibrillation on chronic Coumadin, CHF. She is followed regularly by cardiology. She had labs there in May which were mostly unremarkable. Recent problems include:  Pruritic skin rash right arm. Using triamcinolone cream without much improvement. Significant pruritus.  Recent left groin pain for several months. Went to urgent care last week and reportedly had x-ray. No known injury. Pain is left groin. No swelling or bruising. No adenopathy. Exacerbated by movement.  Left shoulder pain. Present for several weeks. Sometimes wakes at night. No injury. Pain with abduction. No alleviating factors.  Past Medical History  Diagnosis Date  . Arthritis   . Persistent atrial fibrillation     failed on Flecainide and Tikosyn  . Renal calculi   . Hypertension   . Hyperlipidemia   . Rheumatic fever     age 29  . History of blood transfusion   . Incontinence of urine   . Diastolic dysfunction     preserved EF,  CHF In setting of afib previously  . Osteoporosis   . OSA (obstructive sleep apnea)     uses CPAP  . Restless legs   . Systolic heart failure     EF 30 to 35% per echo 08/2013  . Dysrhythmia     afib   Past Surgical History  Procedure Laterality Date  . Breast surgery      benign biopsy  . Tonsillectomy    . Abdominal hysterectomy    . Spine surgery      laminectomy 1965, spinal fusion with rod 2005  . Replacement total knee  2005  . Kidney stone surgery      1997 removed  . Cardioversion N/A 09/10/2013    Procedure: CARDIOVERSION;  Surgeon: Thompson Grayer, MD;  Location: [place];  Service: Cardiovascular;  Laterality: N/A;  . Cardioversion N/A 11/16/2013    Procedure: CARDIOVERSION;  Surgeon: Sueanne Margarita,  MD;  Location: [facility] ENDOSCOPY;  Service: Cardiovascular;  Laterality: N/A;    reports that she has never smoked. She does not have any smokeless tobacco history on file. She reports that she drinks alcohol. She reports that she does not use illicit drugs. family history includes Cancer in her mother and paternal grandfather; Diabetes in her mother. Allergies  Allergen Reactions  . Ciprofloxacin Nausea And Vomiting  . Demerol [Meperidine] Nausea And Vomiting  . Sulfa Antibiotics Nausea And Vomiting      Review of Systems  Constitutional: Negative for fatigue.  Eyes: Negative for visual disturbance.  Respiratory: Negative for cough, chest tightness, shortness of breath and wheezing.   Cardiovascular: Negative for chest pain, palpitations and leg swelling.  Endocrine: Negative for polydipsia and polyuria.  Musculoskeletal: Positive for arthralgias.  Skin: Positive for rash.  Neurological: Negative for dizziness, seizures, syncope, weakness, light-headedness and headaches.       Objective:   Physical Exam  Constitutional: She appears well-developed and well-nourished. No distress.  Cardiovascular: Normal rate.   Pulmonary/Chest: Effort normal and breath sounds normal. No respiratory distress. She has no wheezes. She has no rales.  Musculoskeletal:  No reproducible tenderness left shoulder. She has pain with abduction greater than 90 and minimal pain with internal rotation. No acromioclavicular tenderness. No biceps tenderness.  Slightly restricted range of motion left hip compared  to right. No lower extremity edema  Skin: Rash noted.  Dry excoriated rash right antecubital area of elbow.            Assessment & Plan:  #1 left shoulder pain. Suspect rotator cuff tendinitis.  Doubt rotator cuff tear.   Discussed risks and benefits of corticosteroid injection and patient consented.  After prepping skin with betadine, injected 1 cc depomedrol and 2 cc of plain xylocaine with 23 gauge  one and one half inch needle using posterior lateral approach and pt tolerated well. She did note some immediate improvement afterwards.   #2 left hip pain. Probable degenerative arthritis. Obtain x-rays that were done recently as a first step and go from there. If this shows significant arthritis consider referral to orthopedic surgeon  #3 health maintenance. She wishes to wait for high-dose flu vaccine later in the season. Needs Prevnar 13.  #4 skin rash right arm. Suspect contact dermatitis. She has very dry skin. Add moisturizer such as Lac-Hydrin to her steroid cream twice daily. Avoid scratching

## 2015-05-27 NOTE — Progress Notes (Signed)
Pre visit review using our clinic review tool, if applicable. No additional management support is needed unless otherwise documented below in the visit note. 

## 2015-06-16 DIAGNOSIS — N3941 Urge incontinence: Secondary | ICD-10-CM | POA: Diagnosis not present

## 2015-06-16 DIAGNOSIS — N3020 Other chronic cystitis without hematuria: Secondary | ICD-10-CM | POA: Diagnosis not present

## 2015-06-19 ENCOUNTER — Ambulatory Visit (INDEPENDENT_AMBULATORY_CARE_PROVIDER_SITE_OTHER): Payer: Medicare Other | Admitting: Family Medicine

## 2015-06-19 ENCOUNTER — Encounter: Payer: Self-pay | Admitting: Family Medicine

## 2015-06-19 ENCOUNTER — Ambulatory Visit (INDEPENDENT_AMBULATORY_CARE_PROVIDER_SITE_OTHER): Payer: Medicare Other | Admitting: General Practice

## 2015-06-19 VITALS — BP 110/70 | HR 74 | Temp 98.2°F | Wt 208.0 lb

## 2015-06-19 DIAGNOSIS — R4189 Other symptoms and signs involving cognitive functions and awareness: Secondary | ICD-10-CM | POA: Diagnosis not present

## 2015-06-19 DIAGNOSIS — M25512 Pain in left shoulder: Secondary | ICD-10-CM | POA: Diagnosis not present

## 2015-06-19 DIAGNOSIS — Z5181 Encounter for therapeutic drug level monitoring: Secondary | ICD-10-CM | POA: Diagnosis not present

## 2015-06-19 DIAGNOSIS — R21 Rash and other nonspecific skin eruption: Secondary | ICD-10-CM | POA: Diagnosis not present

## 2015-06-19 DIAGNOSIS — J069 Acute upper respiratory infection, unspecified: Secondary | ICD-10-CM | POA: Diagnosis not present

## 2015-06-19 DIAGNOSIS — B9789 Other viral agents as the cause of diseases classified elsewhere: Secondary | ICD-10-CM

## 2015-06-19 LAB — POCT INR: INR: 2

## 2015-06-19 MED ORDER — MEMANTINE HCL 10 MG PO TABS: 10.0000 mg | ORAL_TABLET | Freq: Two times a day (BID) | ORAL | Status: DC

## 2015-06-19 MED ORDER — ESCITALOPRAM OXALATE 10 MG PO TABS: 10.0000 mg | ORAL_TABLET | Freq: Every day | ORAL | Status: DC

## 2015-06-19 MED ORDER — DONEPEZIL HCL 10 MG PO TBDP: 10.0000 mg | ORAL_TABLET | Freq: Every day | ORAL | Status: DC

## 2015-06-19 MED ORDER — ROPINIROLE HCL 1 MG PO TABS: 1.0000 mg | ORAL_TABLET | Freq: Two times a day (BID) | ORAL | Status: DC

## 2015-06-19 NOTE — Progress Notes (Signed)
Agree with coumadin management. 

## 2015-06-19 NOTE — Progress Notes (Signed)
Pre visit review using our clinic review tool, if applicable. No additional management support is needed unless otherwise documented below in the visit note. 

## 2015-06-19 NOTE — Progress Notes (Signed)
Pre visit review using our clinic review tool, if applicable. No additional management support is needed unless otherwise documented below in the visit note. Influenza immunization was not given due to patient has a cough.Angel Meyer

## 2015-06-19 NOTE — Progress Notes (Signed)
Subjective:    Patient ID: Angel Meyer, female    DOB: 14-May-1933, 81 y.o.   MRN: [id number]  HPI Patient here for follow-up regarding multiple items as follows:  Recent left shoulder pain. She received steroid injection here and has done very well since that time. Improved range of motion. Minimal pain with abduction. Overall doing very well. No weakness.  Persistent pruritic rash both arms and forearms. She has been using moisturizer along with steroid cream without relief. Frequently scratches this area. No clear exacerbating factors.  Recent right hip pain. We reviewed outside films from urgent care they did show some degenerative changes but not severe. Her hip pain is totally resolved at this time  New issue of 2 day history of cough and mild nasal congestion. No fever. Some increased malaise. Denies sore throat  History of cognitive impairment. She currently takes Namenda and Aricept. Initial MMSE 25/30 and she scored 28/30 last time this was assessed. Daughter continues to see issues with short-term memory deficits.  Past Medical History  Diagnosis Date  . Arthritis   . Persistent atrial fibrillation     failed on Flecainide and Tikosyn  . Renal calculi   . Hypertension   . Hyperlipidemia   . Rheumatic fever     age 9  . History of blood transfusion   . Incontinence of urine   . Diastolic dysfunction     preserved EF,  CHF In setting of afib previously  . Osteoporosis   . OSA (obstructive sleep apnea)     uses CPAP  . Restless legs   . Systolic heart failure     EF 30 to 35% per echo 08/2013  . Dysrhythmia     afib   Past Surgical History  Procedure Laterality Date  . Breast surgery      benign biopsy  . Tonsillectomy    . Abdominal hysterectomy    . Spine surgery      laminectomy 1965, spinal fusion with rod 2005  . Replacement total knee  2005  . Kidney stone surgery      1997 removed  . Cardioversion N/A 09/10/2013    Procedure: CARDIOVERSION;   Surgeon: Thompson Grayer, MD;  Location: [place];  Service: Cardiovascular;  Laterality: N/A;  . Cardioversion N/A 11/16/2013    Procedure: CARDIOVERSION;  Surgeon: Sueanne Margarita, MD;  Location: [facility] ENDOSCOPY;  Service: Cardiovascular;  Laterality: N/A;    reports that she has never smoked. She does not have any smokeless tobacco history on file. She reports that she drinks alcohol. She reports that she does not use illicit drugs. family history includes Cancer in her mother and paternal grandfather; Diabetes in her mother. Allergies  Allergen Reactions  . Ciprofloxacin Nausea And Vomiting  . Demerol [Meperidine] Nausea And Vomiting  . Sulfa Antibiotics Nausea And Vomiting      Review of Systems  Constitutional: Positive for fatigue. Negative for fever and chills.  HENT: Positive for congestion.   Respiratory: Positive for cough.   Cardiovascular: Negative for chest pain.  Gastrointestinal: Negative for abdominal pain.  Genitourinary: Negative for dysuria.  Skin: Positive for rash.  Psychiatric/Behavioral: Negative for dysphoric mood and agitation.       Objective:   Physical Exam  Constitutional: She appears well-developed and well-nourished.  Neck: Neck supple. No thyromegaly present.  Cardiovascular: Normal rate and regular rhythm.   Pulmonary/Chest: Effort normal and breath sounds normal. No respiratory distress. She has no wheezes. She has no rales.  Musculoskeletal: She exhibits no edema.  Full range of motion left shoulder. No localized tenderness.  Skin: Rash noted.  She has a few nonspecific excoriations both elbows and forearms approximately          Assessment & Plan:  #1 recent left shoulder pain. Improved following steroid injection. Suspect rotator cuff tendinitis #2 pruritic skin rash arms. Uncertain etiology. Not improved with moisturizers and steroid cream. Set up dermatology referral.  Avoid scratching. #3 probable viral URI with cough. Nonfocal exam. Stay  hydrated and treat symptomatically #4 cognitive impairment. Repeat MMSE- 29 out of 30. She will continue with her Namenda and Aricept. Routine follow-up 6 months

## 2015-07-14 DIAGNOSIS — L821 Other seborrheic keratosis: Secondary | ICD-10-CM | POA: Diagnosis not present

## 2015-07-14 DIAGNOSIS — L989 Disorder of the skin and subcutaneous tissue, unspecified: Secondary | ICD-10-CM | POA: Diagnosis not present

## 2015-07-17 ENCOUNTER — Ambulatory Visit: Payer: Medicare Other | Admitting: *Deleted

## 2015-07-17 ENCOUNTER — Ambulatory Visit (INDEPENDENT_AMBULATORY_CARE_PROVIDER_SITE_OTHER): Payer: Medicare Other | Admitting: General Practice

## 2015-07-17 DIAGNOSIS — Z23 Encounter for immunization: Secondary | ICD-10-CM

## 2015-07-17 DIAGNOSIS — Z5181 Encounter for therapeutic drug level monitoring: Secondary | ICD-10-CM | POA: Diagnosis not present

## 2015-07-17 LAB — POCT INR: INR: 2.8

## 2015-07-17 NOTE — Progress Notes (Signed)
Agree with Coumadin management 

## 2015-07-17 NOTE — Progress Notes (Signed)
Pre visit review using our clinic review tool, if applicable. No additional management support is needed unless otherwise documented below in the visit note. 

## 2015-07-23 ENCOUNTER — Other Ambulatory Visit: Payer: Self-pay | Admitting: Dermatology

## 2015-07-23 DIAGNOSIS — L280 Lichen simplex chronicus: Secondary | ICD-10-CM | POA: Diagnosis not present

## 2015-07-23 DIAGNOSIS — L309 Dermatitis, unspecified: Secondary | ICD-10-CM | POA: Diagnosis not present

## 2015-07-30 ENCOUNTER — Ambulatory Visit: Payer: Medicare Other | Admitting: Nurse Practitioner

## 2015-08-11 ENCOUNTER — Ambulatory Visit (INDEPENDENT_AMBULATORY_CARE_PROVIDER_SITE_OTHER): Payer: Medicare Other | Admitting: General Practice

## 2015-08-11 DIAGNOSIS — Z5181 Encounter for therapeutic drug level monitoring: Secondary | ICD-10-CM

## 2015-08-11 LAB — POCT INR: INR: 2.5

## 2015-08-11 NOTE — Progress Notes (Signed)
Pre visit review using our clinic review tool, if applicable. No additional management support is needed unless otherwise documented below in the visit note. 

## 2015-08-13 ENCOUNTER — Ambulatory Visit (INDEPENDENT_AMBULATORY_CARE_PROVIDER_SITE_OTHER): Payer: Medicare Other | Admitting: Nurse Practitioner

## 2015-08-13 ENCOUNTER — Encounter: Payer: Self-pay | Admitting: Nurse Practitioner

## 2015-08-13 ENCOUNTER — Ambulatory Visit
Admission: RE | Admit: 2015-08-13 | Discharge: 2015-08-13 | Disposition: A | Discharge status: Home / Self Care | Payer: Medicare Other | Source: Ambulatory Visit | Attending: Nurse Practitioner | Admitting: Nurse Practitioner

## 2015-08-13 ENCOUNTER — Other Ambulatory Visit: Payer: Self-pay | Admitting: Nurse Practitioner

## 2015-08-13 VITALS — BP 100/80 | HR 112 | Ht 61.0 in | Wt 205.8 lb

## 2015-08-13 DIAGNOSIS — E785 Hyperlipidemia, unspecified: Secondary | ICD-10-CM

## 2015-08-13 DIAGNOSIS — Z01810 Encounter for preprocedural cardiovascular examination: Secondary | ICD-10-CM | POA: Diagnosis not present

## 2015-08-13 DIAGNOSIS — I482 Chronic atrial fibrillation, unspecified: Secondary | ICD-10-CM

## 2015-08-13 DIAGNOSIS — I4891 Unspecified atrial fibrillation: Secondary | ICD-10-CM | POA: Diagnosis not present

## 2015-08-13 LAB — HEPATIC FUNCTION PANEL
ALT: 16 U/L (ref 6–29)
AST: 21 U/L (ref 10–35)
Albumin: 4.4 g/dL (ref 3.6–5.1)
Alkaline Phosphatase: 56 U/L (ref 33–130)
Bilirubin, Direct: 0.3 mg/dL — ABNORMAL HIGH (ref <=0.2)
Indirect Bilirubin: 1.1 mg/dL (ref 0.2–1.2)
Total Bilirubin: 1.4 mg/dL — ABNORMAL HIGH (ref 0.2–1.2)
Total Protein: 6.8 g/dL (ref 6.1–8.1)

## 2015-08-13 LAB — CBC
HCT: 45.8 % (ref 36.0–46.0)
Hemoglobin: 15.5 g/dL — ABNORMAL HIGH (ref 12.0–15.0)
MCH: 33.1 pg (ref 26.0–34.0)
MCHC: 33.8 g/dL (ref 30.0–36.0)
MCV: 97.9 fL (ref 78.0–100.0)
MPV: 11.2 fL (ref 8.6–12.4)
Platelets: 189 10*3/uL (ref 150–400)
RBC: 4.68 MIL/uL (ref 3.87–5.11)
RDW: 14.3 % (ref 11.5–15.5)
WBC: 6.2 10*3/uL (ref 4.0–10.5)

## 2015-08-13 LAB — BASIC METABOLIC PANEL
BUN: 19 mg/dL (ref 7–25)
CO2: 24 mmol/L (ref 20–31)
Calcium: 9.5 mg/dL (ref 8.6–10.4)
Chloride: 102 mmol/L (ref 98–110)
Creat: 1.08 mg/dL — ABNORMAL HIGH (ref 0.60–0.88)
Glucose, Bld: 93 mg/dL (ref 65–99)
Potassium: 4.1 mmol/L (ref 3.5–5.3)
Sodium: 136 mmol/L (ref 135–146)

## 2015-08-13 LAB — LIPID PANEL
Cholesterol: 201 mg/dL — ABNORMAL HIGH (ref 125–200)
HDL: 75 mg/dL (ref >=46)
LDL Cholesterol: 99 mg/dL (ref <130)
Total CHOL/HDL Ratio: 2.7 Ratio (ref <=5.0)
Triglycerides: 133 mg/dL (ref <150)
VLDL: 27 mg/dL (ref <30)

## 2015-08-13 MED ORDER — NITROGLYCERIN 0.4 MG SL SUBL: 0.4000 mg | SUBLINGUAL_TABLET | SUBLINGUAL | Status: DC | PRN

## 2015-08-13 MED ORDER — ROSUVASTATIN CALCIUM 40 MG PO TABS: 40.0000 mg | ORAL_TABLET | Freq: Every day | ORAL | Status: DC

## 2015-08-13 MED ORDER — AMIODARONE HCL 200 MG PO TABS: 200.0000 mg | ORAL_TABLET | Freq: Two times a day (BID) | ORAL | Status: DC

## 2015-08-13 NOTE — Patient Instructions (Addendum)
We will be checking the following labs today - BMET, CBC, HPF, lipids, PT, PTT and TSH  Please go to [facility] to [place] on the first floor for a chest Xray - you may walk in.   Medication Instructions:    Continue with your current medicines. BUT  I am increasing the amiodarone to twice a day - this has NOT been sent to the drug store.   I have refilled the NTG today  I have refilled the Crestor today.     Testing/Procedures To Be Arranged:  Echocardiogram   Cardioversion  Follow-Up:   Needs appointment after cardioversion with Dr. Rayann Heman to discuss other options ?ablation, ? Pacemaker    Other Special Instructions:  Your provider has recommended a cardioversion.   You are scheduled for a cardioversion on Monday, November 21st at 12 noon with Dr. Radford Pax or associates. Please go to [facility] [address] [facility] at Monday, November 21st at 10:30 AM.  Enter through the [place] not have any food or drink after midnight on Sunday.  You may take your medicines with a sip of water on the day of your procedure.  You will need someone to drive you home following your procedure.    Call the [place] office at [phone] if you have any questions, problems or concerns.     Electrical Cardioversion Electrical cardioversion is the delivery of a jolt of electricity to change the rhythm of the heart. Sticky patches or metal paddles are placed on the chest to deliver the electricity from a device. This is done to restore a normal rhythm. A rhythm that is too fast or not regular keeps the heart from pumping well. Electrical cardioversion is done in an emergency if:  There is low or no blood pressure as a result of the heart rhythm.  Normal rhythm must be restored as fast as possible to protect the brain and heart from further damage.  It may save a life. Cardioversion may be done for heart rhythms that are not  immediately life threatening, such as atrial fibrillation or flutter, in which:  The heart is beating too fast or is not regular.  Medicine to change the rhythm has not worked.  It is safe to wait in order to allow time for preparation. Symptoms of the abnormal rhythm are bothersome. The risk of stroke and other serious problems can be reduced.  LET [facility] CARE PROVIDER KNOW ABOUT:  Any allergies you have. All medicines you are taking, including vitamins, herbs, eye drops, creams, and over-the-counter medicines. Previous problems you or members of your family have had with the use of anesthetics.  Any blood disorders you have.  Previous surgeries you have had.  Medical conditions you have.   RISKS AND COMPLICATIONS  Generally, this is a safe procedure. However, problems can occur and include:  Breathing problems related to the anesthetic used. A blood clot that breaks free and travels to other parts of your body. This could cause a stroke or other problems. The risk of this is lowered by use of blood-thinning medicine (anticoagulant) prior to the procedure. Cardiac arrest (rare).   BEFORE THE PROCEDURE  You may have tests to detect blood clots in your heart and to evaluate heart function. You may start taking anticoagulants so your blood does not clot as easily.  Medicines may be given to help stabilize your heart rate and rhythm.   PROCEDURE You will  be given medicine through an IV tube to reduce discomfort and make you sleepy (sedative).  An electrical shock will be delivered.   AFTER THE PROCEDURE Your heart rhythm will be watched to make sure it does not change. You will need someone to drive you home.       If you need a refill on your cardiac medications before your next appointment, please call your pharmacy.   Call the [place] office at [phone] if you have any questions, problems or concerns.

## 2015-08-13 NOTE — Progress Notes (Signed)
CARDIOLOGY OFFICE NOTE  Date:  08/13/2015    Angel Meyer Date of Birth: 11-Mar-1933 Medical Record [id number]  PCP:  Angel Post, MD  Cardiologist:  Allred    Chief Complaint  Patient presents with  . Atrial Fibrillation    6 month check - seen for Dr. Rayann Meyer  . Hypertension    History of Present Illness: Angel Meyer is a 81 y.o. female who presents today for a follow up visit. Seen for Dr. Rayann Meyer. She has PAF, rheumatic fever at age 3, HTN and OSA. She has a lung nodule that is followed by pulmonary which resolved.  In December of 2014 she presented with progressive dyspnea, orthopnea and cough for 6 to 8 weeks - noted to be in AF with RVR and volume overload. She was treated with flecainide which was unsuccessful. Then placed on Tikosyn and was cardioverted - this also unsuccessful. Now managed with rate control and anticoagulation. Seen by PCCM for her cough/congestion and was to discuss possible amiodarone therapy which was subsequently started and has been maintained. Echo showed her EF down to 30 to 35%. We proceeded on with cardioversion which was successful. She was bradycardic following the procedure and her Coreg and Diltiazem were stopped.   I was able to review some of her records from [place] received on 11/06/13 from Dr. Kerry Dory from [place] - I can see where she was put on amiodarone back in 2012 - then her dose was cut back and then stopped in May of 2013 along with discontinuation of her Coreg - not clear to me as to why. Looks like there was some shortness of breath and fatigue noted and low HR. Her diagnosis was always listed as "paroxysmal AF" and not chronic. Fatigue and DOE seemed to be a prevalent complaint.   I saw her back in March of 2015. She was felt to be doing well. Amiodarone was cut back to her maintenance dose. Really had had no improvement in her symptoms with restoration of sinus rhythm.   She has also been to the ER at the end of  March of 2015 with chest pressure and noted to be in atrial fib with RVR - seen by Dr. Rayann Meyer as well with recommendations as follows - [name] presents with symptomatic atrial fibrillation. She is now back in SR. She does not appear volume overloaded on exam and her CXR is clear. ProBNP only minimally elevated. At this time she is stable for discharge home to follow-up in the office as scheduled with Angel Merle, NP in 3 weeks. She will continue her current medication regimen, including amiodarone 200 mg once daily. She does not need any AV nodal agents on a daily basis. Dr. Rayann Meyer gave instructions for management of atrial fibrillation in the outpatient setting as long as she is stable. If she feels she is in afib and pulse rate >100 bpm, she was instructed to take ONE additional dose of amiodarone (200 mg for one dose only). Also, she was instructed to take short acting diltiazem 30 mg every 6 hours as needed for pulse >100 bpm when in afib. [name] and her daughter expressed verbal understanding and agree with this plan of care.   Seen in January and in May by me - had moved here permanently. Lots of adjusting.  Had been started on Aricept by PCP. Getting pretty sedentary and seemed depressed. Was missing her medicines on occasion.   Comes back today. Here with her  daughter. She does not feel good. Has not felt well for at least a week perhaps 2. She is more short of breath. She is fatigued. She is lightheaded and dizzy. Swelling is about the same. Not eating much. Does not really endorse a sensation of palpitations - no one checked her pulse - thus no prn Diltiazem used. Daughter notes that her mom still misses morning medicines probably 2 to 4 times a week - but is adamant that no evening doses (which includes Coumadin) have NOT been missed. No active bleeding. No chest pain. She has had no falls but has felt presyncopal over the last week.   Past Medical History  Diagnosis Date  . Arthritis     . Persistent atrial fibrillation (HCC)     failed on Flecainide and Tikosyn  . Renal calculi   . Hypertension   . Hyperlipidemia   . Rheumatic fever     age 45  . History of blood transfusion   . Incontinence of urine   . Diastolic dysfunction     preserved EF,  CHF In setting of afib previously  . Osteoporosis   . OSA (obstructive sleep apnea)     uses CPAP  . Restless legs   . Systolic heart failure (HCC)     EF 30 to 35% per echo 08/2013  . Dysrhythmia     afib    Past Surgical History  Procedure Laterality Date  . Breast surgery      benign biopsy  . Tonsillectomy    . Abdominal hysterectomy    . Spine surgery      laminectomy 1965, spinal fusion with rod 2005  . Replacement total knee  2005  . Kidney stone surgery      1997 removed  . Cardioversion N/A 09/10/2013    Procedure: CARDIOVERSION;  Surgeon: Angel Grayer, MD;  Location: [facility];  Service: Cardiovascular;  Laterality: N/A;  . Cardioversion N/A 11/16/2013    Procedure: CARDIOVERSION;  Surgeon: Angel Margarita, MD;  Location: [facility];  Service: Cardiovascular;  Laterality: N/A;     Medications: Current Outpatient Prescriptions  Medication Sig Dispense Refill  . amiodarone (PACERONE) 200 MG tablet Take 1 tablet (200 mg total) by mouth 2 (two) times daily. 60 tablet 6  . Clobetasol Prop Emollient Base 0.05 % emollient cream Apply topically 2 (two) times daily. For likins simplex chroniaus    . diltiazem (CARDIZEM) 30 MG tablet Take only as needed. AFib w/ pulse >100 bpm, take 30 mg (1 tab) by mouth once. May repeat 30 mg (1 tab) every 6 hours PRN for rate >100 bpm. 30 tablet 3  . donepezil (ARICEPT ODT) 10 MG disintegrating tablet Take 1 tablet (10 mg total) by mouth at bedtime. 90 tablet 3  . escitalopram (LEXAPRO) 10 MG tablet Take 1 tablet (10 mg total) by mouth daily. 90 tablet 3  . furosemide (LASIX) 40 MG tablet TAKE 1 TABLET EVERY DAY (Patient taking differently: taking daily prn) 90 tablet 2  .  memantine (NAMENDA) 10 MG tablet Take 1 tablet (10 mg total) by mouth 2 (two) times daily. 180 tablet 3  . nitroGLYCERIN (NITROSTAT) 0.4 MG SL tablet Place 1 tablet (0.4 mg total) under the tongue every 5 (five) minutes as needed for chest pain. 25 tablet 3  . oxybutynin (DITROPAN-XL) 10 MG 24 hr tablet Take 1 tablet by mouth daily.  11  . potassium chloride SA (K-DUR,KLOR-CON) 20 MEQ tablet Take 1 tablet (20 mEq total) by  mouth daily as needed. Takes on days that she takes Lasix 30 tablet 4  . rOPINIRole (REQUIP) 1 MG tablet Take 1 tablet (1 mg total) by mouth 2 (two) times daily. 180 tablet 3  . rosuvastatin (CRESTOR) 40 MG tablet Take 1 tablet (40 mg total) by mouth daily. 90 tablet 3  . spironolactone (ALDACTONE) 25 MG tablet Take 1 tablet (25 mg total) by mouth daily. 90 tablet 3  . trimethoprim (TRIMPEX) 100 MG tablet Take 1 tablet by mouth daily.  11  . warfarin (COUMADIN) 2.5 MG tablet Take as directed by anticoagulation clinic 180 tablet 1  . warfarin (COUMADIN) 2.5 MG tablet Take as directed by anticoagulation clinic 30 tablet 0   No current facility-administered medications for this visit.    Allergies: Allergies  Allergen Reactions  . Ciprofloxacin Nausea And Vomiting  . Demerol [Meperidine] Nausea And Vomiting  . Sulfa Antibiotics Nausea And Vomiting    Social History: The patient  reports that she has never smoked. She does not have any smokeless tobacco history on file. She reports that she drinks alcohol. She reports that she does not use illicit drugs.   Family History: The patient's family history includes Cancer in her mother and paternal grandfather; Diabetes in her mother.   Review of Systems: Please see the history of present illness.   Otherwise, the review of systems is positive for none.   All other systems are reviewed and negative.   Physical Exam: VS:  BP 100/80 mmHg  Pulse 112  Ht 5\' 1"  (1.549 m)  Wt 205 lb 12.8 oz (93.35 kg)  BMI 38.91 kg/m2 .  BMI  Body mass index is 38.91 kg/(m^2).  Wt Readings from Last 3 Encounters:  08/13/15 205 lb 12.8 oz (93.35 kg)  06/19/15 208 lb (94.348 kg)  05/27/15 204 lb (92.534 kg)    General: Elderly female. She looks fatigued but in no acute distress.  HEENT: Normal. Neck: Supple, no JVD, carotid bruits, or masses noted.  Cardiac: Irregular irregular rhythm. Rate is a little fast but not excessive. Legs are fleshy/full with trace edema.  Respiratory:  Lungs are clear to auscultation bilaterally with normal work of breathing.  GI: Soft and nontender.  MS: No deformity or atrophy. Gait and ROM intact. Skin: Warm and dry. Color is normal.  Neuro:  Strength and sensation are intact and no gross focal deficits noted.  Psych: Alert, appropriate and with normal affect.   LABORATORY DATA:  EKG:  EKG is ordered today. This demonstrates AF with RVR today - rate is 112.  Lab Results  Component Value Date   WBC 6.2 02/12/2015   HGB 15.3* 02/12/2015   HCT 44.6 02/12/2015   PLT 147.0* 02/12/2015   GLUCOSE 77 02/12/2015   CHOL 192 10/16/2014   TRIG 91.0 10/16/2014   HDL 83.90 10/16/2014   LDLCALC 90 10/16/2014   ALT 16 02/12/2015   AST 22 02/12/2015   NA 139 02/12/2015   K 4.0 02/12/2015   CL 104 02/12/2015   CREATININE 0.96 02/12/2015   BUN 21 02/12/2015   CO2 27 02/12/2015   TSH 2.33 02/12/2015   INR 2.5 08/11/2015   Lab Results  Component Value Date   INR 2.5 08/11/2015   INR 2.8 07/17/2015   INR 2.0 06/19/2015     BNP (last 3 results) No results for input(s): BNP in the last 8760 hours.  ProBNP (last 3 results)  Recent Labs  10/16/14 0925  PROBNP 175.0*  Other Studies Reviewed Today:  Myoview Impression from February 2015  Exercise Capacity: [place] with no exercise.  BP Response: Hypotensive blood pressure response.  Clinical Symptoms: There is chest heaviness.  ECG Impression: No significant ST segment change suggestive of ischemia.  Comparison with Prior  Nuclear Study: No images to compare  Overall Impression: Normal stress nuclear study.  LV Ejection Fraction: Study not gated. LV Wall Motion: NA  Kirk Ruths    Echo Study Conclusions from May 2015  - Left ventricle: The cavity size was normal. Systolic function was normal. The estimated ejection fraction was in the range of 60% to 65%. Wall motion was normal; there were no regional wall motion abnormalities. Doppler parameters are consistent with abnormal left ventricular relaxation (grade 1 diastolic dysfunction). Doppler parameters are consistent with elevated ventricular end-diastolic filling pressure. - Aortic valve: Trileaflet; moderately thickened, moderately calcified leaflets. Transvalvular velocity was within the normal range. There was no stenosis. No regurgitation. - Mitral valve: Calcified annulus. Mildly thickened leaflets . Mild regurgitation. - Left atrium: The atrium was moderately dilated. - Right ventricle: Systolic function was normal. - Tricuspid valve: No regurgitation. - Pulmonary arteries: Systolic pressure was within the normal range. Impressions:  - Normal biventricular function, no wall motion abnormalities. Abnormal relaxation with elevated filling pressures. Mild mitral regurgitation.  Assessment / Plan:  1. Atrial fib - on amiodarone - she is back in AF - she is symptomatic. Has failed previously on Flecainide and Tikosyn (failed to convert/hold). Will arrange for cardioversion to get symptom relief. Will get her to see Dr. Rayann Meyer to discuss possible ablation. Option of just rate control as well. I worry that she got bradycardic and reverted back to AF and may need PPM placed. Amiodarone is increased today to BID.   2. LV Dysfunction - EF has improved on last echo - now with more dyspnea. Weight is down. I suspect this is from reverting back to AF. Will get her echo updated in light of possible ablation.   3. Chronic  anticoagulation - no problems noted. Daughter very confident that no doses of coumadin have been missed whatsoever. INRs have been therapeutic over the past 3 checks.   4. HTN - BP ok on current regimen.  5. Memory issues - not discussed today.    Current medicines are reviewed with the patient today.  The patient does not have concerns regarding medicines other than what has been noted above.  The following changes have been made:  See above.  Labs/ tests ordered today include:    Orders Placed This Encounter  Procedures  . DG Chest 2 View  . Basic metabolic panel  . CBC  . Hepatic function panel  . Lipid panel  . TSH  . APTT  . Protime-INR  . EKG 12-Lead  . ECHOCARDIOGRAM COMPLETE     Disposition:   As above.   Patient is agreeable to this plan and will call if any problems develop in the interim.   Signed: Burtis Junes, RN, ANP-C 08/13/2015 11:47 AM  [facility] [address] [place], [place]  [address] Phone: [phone] Fax: [phone]

## 2015-08-14 ENCOUNTER — Telehealth: Payer: Self-pay | Admitting: Nurse Practitioner

## 2015-08-14 LAB — APTT: aPTT: 36 seconds (ref 24–37)

## 2015-08-14 LAB — PROTIME-INR
INR: 2.64 — ABNORMAL HIGH (ref <1.50)
Prothrombin Time: 28.6 seconds — ABNORMAL HIGH (ref 11.6–15.2)

## 2015-08-14 LAB — TSH: TSH: 2.446 u[IU]/mL (ref 0.350–4.500)

## 2015-08-14 NOTE — Telephone Encounter (Signed)
New message ° ° ° ° °Returning a call to the nurse to get lab results °

## 2015-08-18 ENCOUNTER — Ambulatory Visit (HOSPITAL_COMMUNITY)
Admission: RE | Admit: 2015-08-18 | Discharge: 2015-08-18 | Disposition: A | Discharge status: Home / Self Care | Payer: Medicare Other | Source: Ambulatory Visit | Attending: Cardiology | Admitting: Cardiology

## 2015-08-18 ENCOUNTER — Ambulatory Visit (HOSPITAL_COMMUNITY): Payer: Medicare Other | Admitting: Anesthesiology

## 2015-08-18 ENCOUNTER — Other Ambulatory Visit: Payer: Self-pay | Admitting: Cardiology

## 2015-08-18 ENCOUNTER — Ambulatory Visit: Payer: Medicare Other

## 2015-08-18 ENCOUNTER — Encounter (HOSPITAL_COMMUNITY)
Admission: RE | Disposition: A | Discharge status: Home / Self Care | Payer: Self-pay | Source: Ambulatory Visit | Attending: Cardiology

## 2015-08-18 ENCOUNTER — Encounter (HOSPITAL_COMMUNITY): Payer: Self-pay | Admitting: *Deleted

## 2015-08-18 DIAGNOSIS — I110 Hypertensive heart disease with heart failure: Secondary | ICD-10-CM | POA: Insufficient documentation

## 2015-08-18 DIAGNOSIS — Z881 Allergy status to other antibiotic agents status: Secondary | ICD-10-CM | POA: Diagnosis not present

## 2015-08-18 DIAGNOSIS — M1990 Unspecified osteoarthritis, unspecified site: Secondary | ICD-10-CM | POA: Insufficient documentation

## 2015-08-18 DIAGNOSIS — I4891 Unspecified atrial fibrillation: Secondary | ICD-10-CM | POA: Diagnosis not present

## 2015-08-18 DIAGNOSIS — Z882 Allergy status to sulfonamides status: Secondary | ICD-10-CM | POA: Insufficient documentation

## 2015-08-18 DIAGNOSIS — R5383 Other fatigue: Secondary | ICD-10-CM | POA: Insufficient documentation

## 2015-08-18 DIAGNOSIS — I340 Nonrheumatic mitral (valve) insufficiency: Secondary | ICD-10-CM | POA: Diagnosis not present

## 2015-08-18 DIAGNOSIS — Z7901 Long term (current) use of anticoagulants: Secondary | ICD-10-CM | POA: Insufficient documentation

## 2015-08-18 DIAGNOSIS — I481 Persistent atrial fibrillation: Secondary | ICD-10-CM | POA: Insufficient documentation

## 2015-08-18 DIAGNOSIS — G4733 Obstructive sleep apnea (adult) (pediatric): Secondary | ICD-10-CM | POA: Insufficient documentation

## 2015-08-18 DIAGNOSIS — Z79899 Other long term (current) drug therapy: Secondary | ICD-10-CM | POA: Diagnosis not present

## 2015-08-18 DIAGNOSIS — I959 Hypotension, unspecified: Secondary | ICD-10-CM | POA: Diagnosis not present

## 2015-08-18 DIAGNOSIS — M810 Age-related osteoporosis without current pathological fracture: Secondary | ICD-10-CM | POA: Insufficient documentation

## 2015-08-18 DIAGNOSIS — I480 Paroxysmal atrial fibrillation: Secondary | ICD-10-CM | POA: Insufficient documentation

## 2015-08-18 DIAGNOSIS — G2581 Restless legs syndrome: Secondary | ICD-10-CM | POA: Insufficient documentation

## 2015-08-18 DIAGNOSIS — Z885 Allergy status to narcotic agent status: Secondary | ICD-10-CM | POA: Insufficient documentation

## 2015-08-18 DIAGNOSIS — E785 Hyperlipidemia, unspecified: Secondary | ICD-10-CM | POA: Insufficient documentation

## 2015-08-18 DIAGNOSIS — J45909 Unspecified asthma, uncomplicated: Secondary | ICD-10-CM | POA: Diagnosis not present

## 2015-08-18 DIAGNOSIS — I5042 Chronic combined systolic (congestive) and diastolic (congestive) heart failure: Secondary | ICD-10-CM | POA: Insufficient documentation

## 2015-08-18 HISTORY — PX: CARDIOVERSION: SHX1299

## 2015-08-18 LAB — PROTIME-INR
INR: 3.46 — ABNORMAL HIGH (ref 0.00–1.49)
PROTHROMBIN TIME: 34.1 s — AB (ref 11.6–15.2)

## 2015-08-18 SURGERY — CARDIOVERSION
Anesthesia: Monitor Anesthesia Care

## 2015-08-18 MED ORDER — ROPINIROLE HCL 1 MG PO TABS: 1.0000 mg | ORAL_TABLET | ORAL | Status: DC

## 2015-08-18 MED ORDER — PROPOFOL 10 MG/ML IV BOLUS
INTRAVENOUS | Status: DC | PRN
  Administered 2015-08-18: 50 mg via INTRAVENOUS
  Administered 2015-08-18: 30 mg via INTRAVENOUS

## 2015-08-18 MED ORDER — POTASSIUM CHLORIDE CRYS ER 20 MEQ PO TBCR: 20.0000 meq | EXTENDED_RELEASE_TABLET | Freq: Every day | ORAL | Status: DC

## 2015-08-18 MED ORDER — DILTIAZEM HCL 30 MG PO TABS: 30.0000 mg | ORAL_TABLET | Freq: Four times a day (QID) | ORAL | Status: DC | PRN

## 2015-08-18 MED ORDER — WARFARIN SODIUM 2.5 MG PO TABS: 2.5000 mg | ORAL_TABLET | Freq: Every day | ORAL | Status: DC

## 2015-08-18 MED ORDER — FUROSEMIDE 40 MG PO TABS: 20.0000 mg | ORAL_TABLET | Freq: Every morning | ORAL | Status: DC

## 2015-08-18 NOTE — CV Procedure (Signed)
     Electrical Cardioversion Procedure Note Sherlonda Gallman [id number] 01-Mar-1933  Procedure: Electrical Cardioversion Indications:  Atrial Fibrillation  Time Out: Verified patient identification, verified procedure,medications/allergies/relevent history reviewed, required imaging and test results available.  Performed  Procedure Details  The patient was NPO after midnight. Anesthesia was administered at the beside  by Dr. Ermalene Postin with 80mg  of propofol.  Cardioversion was done with synchronized biphasic defibrillation with AP pads with 150watts.  The patient converted to normal sinus rhythm. The patient tolerated the procedure well   IMPRESSION:  Successful cardioversion of atrial fibrillation    [name],[name] 08/18/2015, 12:07 PM

## 2015-08-18 NOTE — Interval H&P Note (Signed)
History and Physical Interval Note:  08/18/2015 12:07 PM  Angel Meyer  has presented today for surgery, with the diagnosis of AFIB  The various methods of treatment have been discussed with the patient and family. After consideration of risks, benefits and other options for treatment, the patient has consented to  Procedure(s): CARDIOVERSION (N/A) as a surgical intervention .  The patient's history has been reviewed, patient examined, no change in status, stable for surgery.  I have reviewed the patient's chart and labs.  Questions were answered to the patient's satisfaction.     [name],[name]

## 2015-08-18 NOTE — Anesthesia Preprocedure Evaluation (Addendum)
Anesthesia Evaluation  Patient identified by MRN, date of birth, ID band Patient awake    Reviewed: Allergy & Precautions, NPO status , Patient's Chart, lab work & pertinent test results  History of Anesthesia Complications Negative for: history of anesthetic complications  Airway Mallampati: II  TM Distance: >3 FB Neck ROM: Full    Dental  (+) Teeth Intact   Pulmonary asthma , sleep apnea ,    Pulmonary exam normal breath sounds clear to auscultation       Cardiovascular hypertension, +CHF  + dysrhythmias Atrial Fibrillation  Rhythm:Irregular     Neuro/Psych negative neurological ROS  negative psych ROS   GI/Hepatic Neg liver ROS,   Endo/Other    Renal/GU Renal InsufficiencyRenal disease     Musculoskeletal  (+) Arthritis ,   Abdominal   Peds  Hematology   Anesthesia Other Findings   Reproductive/Obstetrics                          Anesthesia Physical Anesthesia Plan  ASA: II  Anesthesia Plan: General and MAC   Post-op Pain Management:    Induction: Intravenous  Airway Management Planned: Mask  Additional Equipment: None  Intra-op Plan:   Post-operative Plan:   Informed Consent: I have reviewed the patients History and Physical, chart, labs and discussed the procedure including the risks, benefits and alternatives for the proposed anesthesia with the patient or authorized representative who has indicated his/her understanding and acceptance.   Dental advisory given  Plan Discussed with: CRNA and Surgeon  Anesthesia Plan Comments:         Anesthesia Quick Evaluation

## 2015-08-18 NOTE — H&P (View-Only) (Signed)
CARDIOLOGY OFFICE NOTE  Date:  08/13/2015    Angel Meyer Date of Birth: 1932/10/09 Medical Record [id number]  PCP:  [name], MD  Cardiologist:  Allred    Chief Complaint  Patient presents with  . Atrial Fibrillation    6 month check - seen for Dr. Rayann Heman  . Hypertension    History of Present Illness: Angel Meyer is a 81 y.o. female who presents today for a follow up visit. Seen for Dr. Rayann Heman. She has PAF, rheumatic fever at age 22, HTN and OSA. She has a lung nodule that is followed by pulmonary which resolved.  In December of 2014 she presented with progressive dyspnea, orthopnea and cough for 6 to 8 weeks - noted to be in AF with RVR and volume overload. She was treated with flecainide which was unsuccessful. Then placed on Tikosyn and was cardioverted - this also unsuccessful. Now managed with rate control and anticoagulation. Seen by PCCM for her cough/congestion and was to discuss possible amiodarone therapy which was subsequently started and has been maintained. Echo showed her EF down to 30 to 35%. We proceeded on with cardioversion which was successful. She was bradycardic following the procedure and her Coreg and Diltiazem were stopped.   I was able to review some of her records from [place] received on 11/06/13 from Dr. Kerry Dory from [place] - I can see where she was put on amiodarone back in 2012 - then her dose was cut back and then stopped in May of 2013 along with discontinuation of her Coreg - not clear to me as to why. Looks like there was some shortness of breath and fatigue noted and low HR. Her diagnosis was always listed as "paroxysmal AF" and not chronic. Fatigue and DOE seemed to be a prevalent complaint.   I saw her back in March of 2015. She was felt to be doing well. Amiodarone was cut back to her maintenance dose. Really had had no improvement in her symptoms with restoration of sinus rhythm.   She has also been to the ER at the end of  March of 2015 with chest pressure and noted to be in atrial fib with RVR - seen by Dr. Rayann Heman as well with recommendations as follows - Ms. Biehn presents with symptomatic atrial fibrillation. She is now back in SR. She does not appear volume overloaded on exam and her CXR is clear. ProBNP only minimally elevated. At this time she is stable for discharge home to follow-up in the office as scheduled with Truitt Merle, NP in 3 weeks. She will continue her current medication regimen, including amiodarone 200 mg once daily. She does not need any AV nodal agents on a daily basis. Dr. Rayann Heman gave instructions for management of atrial fibrillation in the outpatient setting as long as she is stable. If she feels she is in afib and pulse rate >100 bpm, she was instructed to take ONE additional dose of amiodarone (200 mg for one dose only). Also, she was instructed to take short acting diltiazem 30 mg every 6 hours as needed for pulse >100 bpm when in afib. Ms. Colan and her daughter expressed verbal understanding and agree with this plan of care.   Seen in January and in May by me - had moved here permanently. Lots of adjusting.  Had been started on Aricept by PCP. Getting pretty sedentary and seemed depressed. Was missing her medicines on occasion.   Comes back today. Here with her  daughter. She does not feel good. Has not felt well for at least a week perhaps 2. She is more short of breath. She is fatigued. She is lightheaded and dizzy. Swelling is about the same. Not eating much. Does not really endorse a sensation of palpitations - no one checked her pulse - thus no prn Diltiazem used. Daughter notes that her mom still misses morning medicines probably 2 to 4 times a week - but is adamant that no evening doses (which includes Coumadin) have NOT been missed. No active bleeding. No chest pain. She has had no falls but has felt presyncopal over the last week.   Past Medical History  Diagnosis Date  . Arthritis     . Persistent atrial fibrillation (HCC)     failed on Flecainide and Tikosyn  . Renal calculi   . Hypertension   . Hyperlipidemia   . Rheumatic fever     age 76  . History of blood transfusion   . Incontinence of urine   . Diastolic dysfunction     preserved EF,  CHF In setting of afib previously  . Osteoporosis   . OSA (obstructive sleep apnea)     uses CPAP  . Restless legs   . Systolic heart failure (HCC)     EF 30 to 35% per echo 08/2013  . Dysrhythmia     afib    Past Surgical History  Procedure Laterality Date  . Breast surgery      benign biopsy  . Tonsillectomy    . Abdominal hysterectomy    . Spine surgery      laminectomy 1965, spinal fusion with rod 2005  . Replacement total knee  2005  . Kidney stone surgery      1997 removed  . Cardioversion N/A 09/10/2013    Procedure: CARDIOVERSION;  Surgeon: Thompson Grayer, MD;  Location: [facility];  Service: Cardiovascular;  Laterality: N/A;  . Cardioversion N/A 11/16/2013    Procedure: CARDIOVERSION;  Surgeon: Sueanne Margarita, MD;  Location: [facility];  Service: Cardiovascular;  Laterality: N/A;     Medications: Current Outpatient Prescriptions  Medication Sig Dispense Refill  . amiodarone (PACERONE) 200 MG tablet Take 1 tablet (200 mg total) by mouth 2 (two) times daily. 60 tablet 6  . Clobetasol Prop Emollient Base 0.05 % emollient cream Apply topically 2 (two) times daily. For likins simplex chroniaus    . diltiazem (CARDIZEM) 30 MG tablet Take only as needed. AFib w/ pulse >100 bpm, take 30 mg (1 tab) by mouth once. May repeat 30 mg (1 tab) every 6 hours PRN for rate >100 bpm. 30 tablet 3  . donepezil (ARICEPT ODT) 10 MG disintegrating tablet Take 1 tablet (10 mg total) by mouth at bedtime. 90 tablet 3  . escitalopram (LEXAPRO) 10 MG tablet Take 1 tablet (10 mg total) by mouth daily. 90 tablet 3  . furosemide (LASIX) 40 MG tablet TAKE 1 TABLET EVERY DAY (Patient taking differently: taking daily prn) 90 tablet 2  .  memantine (NAMENDA) 10 MG tablet Take 1 tablet (10 mg total) by mouth 2 (two) times daily. 180 tablet 3  . nitroGLYCERIN (NITROSTAT) 0.4 MG SL tablet Place 1 tablet (0.4 mg total) under the tongue every 5 (five) minutes as needed for chest pain. 25 tablet 3  . oxybutynin (DITROPAN-XL) 10 MG 24 hr tablet Take 1 tablet by mouth daily.  11  . potassium chloride SA (K-DUR,KLOR-CON) 20 MEQ tablet Take 1 tablet (20 mEq total) by  mouth daily as needed. Takes on days that she takes Lasix 30 tablet 4  . rOPINIRole (REQUIP) 1 MG tablet Take 1 tablet (1 mg total) by mouth 2 (two) times daily. 180 tablet 3  . rosuvastatin (CRESTOR) 40 MG tablet Take 1 tablet (40 mg total) by mouth daily. 90 tablet 3  . spironolactone (ALDACTONE) 25 MG tablet Take 1 tablet (25 mg total) by mouth daily. 90 tablet 3  . trimethoprim (TRIMPEX) 100 MG tablet Take 1 tablet by mouth daily.  11  . warfarin (COUMADIN) 2.5 MG tablet Take as directed by anticoagulation clinic 180 tablet 1  . warfarin (COUMADIN) 2.5 MG tablet Take as directed by anticoagulation clinic 30 tablet 0   No current facility-administered medications for this visit.    Allergies: Allergies  Allergen Reactions  . Ciprofloxacin Nausea And Vomiting  . Demerol [Meperidine] Nausea And Vomiting  . Sulfa Antibiotics Nausea And Vomiting    Social History: The patient  reports that she has never smoked. She does not have any smokeless tobacco history on file. She reports that she drinks alcohol. She reports that she does not use illicit drugs.   Family History: The patient's family history includes Cancer in her mother and paternal grandfather; Diabetes in her mother.   Review of Systems: Please see the history of present illness.   Otherwise, the review of systems is positive for none.   All other systems are reviewed and negative.   Physical Exam: VS:  BP 100/80 mmHg  Pulse 112  Ht 5\' 1"  (1.549 m)  Wt 205 lb 12.8 oz (93.35 kg)  BMI 38.91 kg/m2 .  BMI  Body mass index is 38.91 kg/(m^2).  Wt Readings from Last 3 Encounters:  08/13/15 205 lb 12.8 oz (93.35 kg)  06/19/15 208 lb (94.348 kg)  05/27/15 204 lb (92.534 kg)    General: Elderly female. She looks fatigued but in no acute distress.  HEENT: Normal. Neck: Supple, no JVD, carotid bruits, or masses noted.  Cardiac: Irregular irregular rhythm. Rate is a little fast but not excessive. Legs are fleshy/full with trace edema.  Respiratory:  Lungs are clear to auscultation bilaterally with normal work of breathing.  GI: Soft and nontender.  MS: No deformity or atrophy. Gait and ROM intact. Skin: Warm and dry. Color is normal.  Neuro:  Strength and sensation are intact and no gross focal deficits noted.  Psych: Alert, appropriate and with normal affect.   LABORATORY DATA:  EKG:  EKG is ordered today. This demonstrates AF with RVR today - rate is 112.  Lab Results  Component Value Date   WBC 6.2 02/12/2015   HGB 15.3* 02/12/2015   HCT 44.6 02/12/2015   PLT 147.0* 02/12/2015   GLUCOSE 77 02/12/2015   CHOL 192 10/16/2014   TRIG 91.0 10/16/2014   HDL 83.90 10/16/2014   LDLCALC 90 10/16/2014   ALT 16 02/12/2015   AST 22 02/12/2015   NA 139 02/12/2015   K 4.0 02/12/2015   CL 104 02/12/2015   CREATININE 0.96 02/12/2015   BUN 21 02/12/2015   CO2 27 02/12/2015   TSH 2.33 02/12/2015   INR 2.5 08/11/2015   Lab Results  Component Value Date   INR 2.5 08/11/2015   INR 2.8 07/17/2015   INR 2.0 06/19/2015     BNP (last 3 results) No results for input(s): BNP in the last 8760 hours.  ProBNP (last 3 results)  Recent Labs  10/16/14 0925  PROBNP 175.0*  Other Studies Reviewed Today:  Myoview Impression from February 2015  Exercise Capacity: [place] with no exercise.  BP Response: Hypotensive blood pressure response.  Clinical Symptoms: There is chest heaviness.  ECG Impression: No significant ST segment change suggestive of ischemia.  Comparison with Prior  Nuclear Study: No images to compare  Overall Impression: Normal stress nuclear study.  LV Ejection Fraction: Study not gated. LV Wall Motion: NA  Kirk Ruths    Echo Study Conclusions from May 2015  - Left ventricle: The cavity size was normal. Systolic function was normal. The estimated ejection fraction was in the range of 60% to 65%. Wall motion was normal; there were no regional wall motion abnormalities. Doppler parameters are consistent with abnormal left ventricular relaxation (grade 1 diastolic dysfunction). Doppler parameters are consistent with elevated ventricular end-diastolic filling pressure. - Aortic valve: Trileaflet; moderately thickened, moderately calcified leaflets. Transvalvular velocity was within the normal range. There was no stenosis. No regurgitation. - Mitral valve: Calcified annulus. Mildly thickened leaflets . Mild regurgitation. - Left atrium: The atrium was moderately dilated. - Right ventricle: Systolic function was normal. - Tricuspid valve: No regurgitation. - Pulmonary arteries: Systolic pressure was within the normal range. Impressions:  - Normal biventricular function, no wall motion abnormalities. Abnormal relaxation with elevated filling pressures. Mild mitral regurgitation.  Assessment / Plan:  1. Atrial fib - on amiodarone - she is back in AF - she is symptomatic. Has failed previously on Flecainide and Tikosyn (failed to convert/hold). Will arrange for cardioversion to get symptom relief. Will get her to see Dr. Rayann Heman to discuss possible ablation. Option of just rate control as well. I worry that she got bradycardic and reverted back to AF and may need PPM placed. Amiodarone is increased today to BID.   2. LV Dysfunction - EF has improved on last echo - now with more dyspnea. Weight is down. I suspect this is from reverting back to AF. Will get her echo updated in light of possible ablation.   3. Chronic  anticoagulation - no problems noted. Daughter very confident that no doses of coumadin have been missed whatsoever. INRs have been therapeutic over the past 3 checks.   4. HTN - BP ok on current regimen.  5. Memory issues - not discussed today.    Current medicines are reviewed with the patient today.  The patient does not have concerns regarding medicines other than what has been noted above.  The following changes have been made:  See above.  Labs/ tests ordered today include:    Orders Placed This Encounter  Procedures  . DG Chest 2 View  . Basic metabolic panel  . CBC  . Hepatic function panel  . Lipid panel  . TSH  . APTT  . Protime-INR  . EKG 12-Lead  . ECHOCARDIOGRAM COMPLETE     Disposition:   As above.   Patient is agreeable to this plan and will call if any problems develop in the interim.   Signed: Burtis Junes, RN, ANP-C 08/13/2015 11:47 AM  [facility] [address] [place], [place]  [address] Phone: [phone] Fax: [phone]

## 2015-08-18 NOTE — Anesthesia Postprocedure Evaluation (Signed)
Anesthesia Post Note  Patient: Angel Meyer  Procedure(s) Performed: Procedure(s) (LRB): CARDIOVERSION (N/A)  Patient location during evaluation: Endoscopy Anesthesia Type: MAC Level of consciousness: awake and alert Pain management: pain level controlled Vital Signs Assessment: post-procedure vital signs reviewed and stable Respiratory status: spontaneous breathing Cardiovascular status: stable Postop Assessment: No signs of nausea or vomiting Anesthetic complications: no    Last Vitals:  Filed Vitals:   08/18/15 1240 08/18/15 1250  BP: 119/69 118/73  Pulse: 52 42  Temp:    Resp: 18 17    Last Pain: There were no vitals filed for this visit.               Sosha Shepherd

## 2015-08-18 NOTE — Discharge Instructions (Signed)
Electrical Cardioversion, Care After °Refer to this sheet in the next few weeks. These instructions provide you with information on caring for yourself after your procedure. Your health care provider may also give you more specific instructions. Your treatment has been planned according to current medical practices, but problems sometimes occur. Call your health care provider if you have any problems or questions after your procedure. °WHAT TO EXPECT AFTER THE PROCEDURE °After your procedure, it is typical to have the following sensations: °· Some redness on the skin where the shocks were delivered. If this is tender, a sunburn lotion or hydrocortisone cream may help. °· Possible return of an abnormal heart rhythm within hours or days after the procedure. °HOME CARE INSTRUCTIONS °· Take medicines only as directed by your health care provider. Be sure you understand how and when to take your medicine. °· Learn how to feel your pulse and check it often. °· Limit your activity for 48 hours after the procedure or as directed by your health care provider. °· Avoid or minimize caffeine and other stimulants as directed by your health care provider. °SEEK MEDICAL CARE IF: °· You feel like your heart is beating too fast or your pulse is not regular. °· You have any questions about your medicines. °· You have bleeding that will not stop. °SEEK IMMEDIATE MEDICAL CARE IF: °· You are dizzy or feel faint. °· It is hard to breathe or you feel short of breath. °· There is a change in discomfort in your chest. °· Your speech is slurred or you have trouble moving an arm or leg on one side of your body. °· You get a serious muscle cramp that does not go away. °· Your fingers or toes turn cold or blue. °  °This information is not intended to replace advice given to you by your health care provider. Make sure you discuss any questions you have with your health care provider. °  °Document Released: 07/04/2013 Document Revised: 10/04/2014  Document Reviewed: 07/04/2013 °Elsevier Interactive Patient Education ©2016 [employer] ° °

## 2015-08-18 NOTE — Transfer of Care (Signed)
Immediate Anesthesia Transfer of Care Note  Patient: Angel Meyer  Procedure(s) Performed: Procedure(s): CARDIOVERSION (N/A)  Patient Location: PACU and Endoscopy Unit  Anesthesia Type:MAC  Level of Consciousness: awake, alert  and oriented  Airway & Oxygen Therapy: Patient Spontanous Breathing and Patient connected to nasal cannula oxygen  Post-op Assessment: Report given to RN and Post -op Vital signs reviewed and stable  Post vital signs: Reviewed and stable  Last Vitals:  Filed Vitals:   08/18/15 1210 08/18/15 1225  BP: 129/85 119/69  Pulse:    Temp:    Resp:      Complications: No apparent anesthesia complications

## 2015-08-18 NOTE — Progress Notes (Signed)
Dr. Radford Pax notified of possible change in 12 lead ECG.  Per MD, pt remains in SB with PACs, may discharge pt home.

## 2015-08-19 ENCOUNTER — Encounter (HOSPITAL_COMMUNITY): Payer: Self-pay | Admitting: Cardiology

## 2015-08-26 ENCOUNTER — Other Ambulatory Visit (HOSPITAL_COMMUNITY): Payer: Medicare Other

## 2015-09-03 ENCOUNTER — Ambulatory Visit (INDEPENDENT_AMBULATORY_CARE_PROVIDER_SITE_OTHER): Payer: Medicare Other | Admitting: Internal Medicine

## 2015-09-03 ENCOUNTER — Ambulatory Visit (HOSPITAL_COMMUNITY): Payer: Medicare Other | Attending: Cardiovascular Disease

## 2015-09-03 ENCOUNTER — Other Ambulatory Visit: Payer: Self-pay

## 2015-09-03 ENCOUNTER — Encounter: Payer: Self-pay | Admitting: Internal Medicine

## 2015-09-03 VITALS — BP 140/90 | HR 54 | Ht 61.0 in | Wt 205.0 lb

## 2015-09-03 DIAGNOSIS — G4733 Obstructive sleep apnea (adult) (pediatric): Secondary | ICD-10-CM

## 2015-09-03 DIAGNOSIS — I481 Persistent atrial fibrillation: Secondary | ICD-10-CM | POA: Diagnosis not present

## 2015-09-03 DIAGNOSIS — I10 Essential (primary) hypertension: Secondary | ICD-10-CM

## 2015-09-03 DIAGNOSIS — E785 Hyperlipidemia, unspecified: Secondary | ICD-10-CM

## 2015-09-03 DIAGNOSIS — I482 Chronic atrial fibrillation, unspecified: Secondary | ICD-10-CM

## 2015-09-03 DIAGNOSIS — R0602 Shortness of breath: Secondary | ICD-10-CM | POA: Insufficient documentation

## 2015-09-03 DIAGNOSIS — I4819 Other persistent atrial fibrillation: Secondary | ICD-10-CM

## 2015-09-03 NOTE — Patient Instructions (Signed)
Medication Instructions:  Your physician has recommended you make the following change in your medication:  1) Decrease Amiodarone in 3 weeks to 200mg  daily   Labwork: None ordered   Testing/Procedures: .   Follow-Up: Your physician recommends that you schedule a follow-up appointment in: 3 months with Angel Merle, NP   Any Other Special Instructions Will Be Listed Below (If Applicable).     If you need a refill on your cardiac medications before your next appointment, please call your pharmacy.

## 2015-09-08 ENCOUNTER — Encounter: Payer: Self-pay | Admitting: Internal Medicine

## 2015-09-08 NOTE — Progress Notes (Signed)
Electrophysiology Office Note   Date:  09/08/2015   ID:  Angel Meyer, DOB 05/06/1933, MRN [id number]  PCP:  Angel Post, MD  Primary Electrophysiologist: Thompson Grayer, MD    Chief Complaint  Patient presents with  . Atrial Fibrillation  . Acute on chronic diastolic CHF     History of Present Illness: Angel Meyer is a 81 y.o. female who presents today for electrophysiology evaluation.   She has persistent atrial fibrillation.  She previously failed medical therapy with flecainide and tikosyn.  She has more recently been treated with amiodarone.  She is not very active and has some difficulty with cognative impairment.  She is accompanied by her daughter today.  She thinks that the patient may have missed several doses of amiodarone before recently converting to afib.  She had actually done well from an arrhythmia standpoint over the past year.  She most recently developed recurrent afib and was cardioverted 11/16.  She is now maintaining sinus rhythm.  Today, she denies symptoms of palpitations, chest pain, shortness of breath, orthopnea, PND, lower extremity edema, claudication, dizziness, presyncope, syncope, bleeding, or neurologic sequela. The patient is tolerating medications without difficulties and is otherwise without complaint today.    Past Medical History  Diagnosis Date  . Arthritis   . Persistent atrial fibrillation (HCC)     failed on Flecainide and Tikosyn  . Renal calculi   . Hypertension   . Hyperlipidemia   . Rheumatic fever     age 40  . History of blood transfusion   . Incontinence of urine   . Diastolic dysfunction     preserved EF,  CHF In setting of afib previously  . Osteoporosis   . OSA (obstructive sleep apnea)     uses CPAP  . Restless legs   . Systolic heart failure (HCC)     EF 30 to 35% per echo 08/2013  . Dysrhythmia     afib   Past Surgical History  Procedure Laterality Date  . Breast surgery      benign biopsy  . Tonsillectomy     . Abdominal hysterectomy    . Spine surgery      laminectomy 1965, spinal fusion with rod 2005  . Replacement total knee  2005  . Kidney stone surgery      1997 removed  . Cardioversion N/A 09/10/2013    Procedure: CARDIOVERSION;  Surgeon: Thompson Grayer, MD;  Location: [place] [facility];  Service: Cardiovascular;  Laterality: N/A;  . Cardioversion N/A 11/16/2013    Procedure: CARDIOVERSION;  Surgeon: Sueanne Margarita, MD;  Location: [facility];  Service: Cardiovascular;  Laterality: N/A;  . Cardioversion N/A 08/18/2015    Procedure: CARDIOVERSION;  Surgeon: Sueanne Margarita, MD;  Location: [facility];  Service: Cardiovascular;  Laterality: N/A;     Current Outpatient Prescriptions  Medication Sig Dispense Refill  . amiodarone (PACERONE) 200 MG tablet Take 1 tablet (200 mg total) by mouth 2 (two) times daily. 60 tablet 6  . clobetasol cream (TEMOVATE) AB-123456789 % Apply 1 application topically 2 (two) times daily. For lichens simplex chronicus    . diltiazem (CARDIZEM) 30 MG tablet Take 1 tablet (30 mg total) by mouth every 6 (six) hours as needed (AFIB w/pulse >100 bpm until resolved). 30 tablet 3  . donepezil (ARICEPT ODT) 10 MG disintegrating tablet Take 1 tablet (10 mg total) by mouth at bedtime. 90 tablet 3  . escitalopram (LEXAPRO) 10 MG tablet Take 1 tablet (10 mg total) by mouth  daily. 90 tablet 3  . furosemide (LASIX) 40 MG tablet Take 0.5 tablets (20 mg total) by mouth every morning. 90 tablet 2  . Melatonin 5 MG TABS Take 5 mg by mouth at bedtime.    . memantine (NAMENDA) 10 MG tablet Take 1 tablet (10 mg total) by mouth 2 (two) times daily. 180 tablet 3  . Multiple Vitamins-Minerals (PRESERVISION AREDS 2) CAPS Take 1 capsule by mouth 2 (two) times daily.    . nitroGLYCERIN (NITROSTAT) 0.4 MG SL tablet Place 1 tablet (0.4 mg total) under the tongue every 5 (five) minutes as needed for chest pain. 25 tablet 3  . oxybutynin (DITROPAN-XL) 10 MG 24 hr tablet Take 10 mg by mouth daily.   11  .  potassium chloride SA (K-DUR,KLOR-CON) 20 MEQ tablet Take 1 tablet (20 mEq total) by mouth daily. 30 tablet 4  . rOPINIRole (REQUIP) 1 MG tablet Take 1 tablet (1 mg total) by mouth See admin instructions. Take 1 tablet (1 mg) by mouth daily at 4pm and at bedtime 180 tablet 3  . rosuvastatin (CRESTOR) 40 MG tablet Take 1 tablet (40 mg total) by mouth daily. (Patient taking differently: Take 40 mg by mouth at bedtime. ) 90 tablet 3  . spironolactone (ALDACTONE) 25 MG tablet Take 1 tablet (25 mg total) by mouth daily. 90 tablet 3  . trimethoprim (TRIMPEX) 100 MG tablet Take 100 mg by mouth daily.   11  . warfarin (COUMADIN) 2.5 MG tablet Take as directed by anticoagulation clinic 180 tablet 1  . warfarin (COUMADIN) 2.5 MG tablet Take 1-2 tablets (2.5-5 mg total) by mouth daily before supper. Daily at 4pm:  Take 2 tablets (5 mg) by mouth on Sunday and Wednesday, take 1 tablet (2.5 mg) on Monday, Tuesday, Thursday, Friday and Saturday or as directed by anticoagulation clinic 30 tablet 0   No current facility-administered medications for this visit.    Allergies:   Ciprofloxacin; Demerol; and Sulfa antibiotics   Social History:  The patient  reports that she has never smoked. She does not have any smokeless tobacco history on file. She reports that she drinks alcohol. She reports that she does not use illicit drugs.   Family History:  The patient's  family history includes Cancer in her mother and paternal grandfather; Diabetes in her mother.    ROS:  Please see the history of present illness.   All other systems are reviewed and negative.    PHYSICAL EXAM: VS:  BP 140/90 mmHg  Pulse 54  Ht 5\' 1"  (1.549 m)  Wt 205 lb (92.987 kg)  BMI 38.75 kg/m2 , BMI Body mass index is 38.75 kg/(m^2). GEN: Well nourished, well developed, in no acute distress HEENT: normal Neck: no JVD, carotid bruits, or masses Cardiac: RRR; no murmurs, rubs, or gallops,no edema  Respiratory:  clear to auscultation  bilaterally, normal work of breathing GI: soft, nontender, nondistended, + BS MS: no deformity or atrophy Skin: warm and dry  Neuro:  Strength and sensation are intact Psych: euthymic mood, full affect  EKG:  EKG is ordered today. The ekg ordered today shows sinus rhythm   Recent Labs: 10/16/2014: Pro B Natriuretic peptide (BNP) 175.0* 08/13/2015: ALT 16; BUN 19; Creat 1.08*; Hemoglobin 15.5*; Platelets 189; Potassium 4.1; Sodium 136; TSH 2.446    Lipid Panel     Component Value Date/Time   CHOL 201* 08/13/2015 1219   TRIG 133 08/13/2015 1219   HDL 75 08/13/2015 1219   CHOLHDL 2.7 08/13/2015  1219   VLDL 27 08/13/2015 1219   LDLCALC 99 08/13/2015 1219     Wt Readings from Last 3 Encounters:  09/03/15 205 lb (92.987 kg)  08/18/15 205 lb (92.987 kg)  08/13/15 205 lb 12.8 oz (93.35 kg)     ASSESSMENT AND PLAN:  1.  Persistent afib Maintaining sinus with amiodarone. Continue twice daily amiodarone for 3 weeks then return to 200mg  daily Continue long term anticoagulation Poor candidate for ablation given advanced age,sedentary lifestyle, and conative changes.  Given prior rheumatic fever, anticipated success with ablation is reduced. Daughter agrees with a conservative approach  2. OSA Compliance with CPAP encouraged  3. HTN Stable No change required today   Follow-up with Brynda Rim as scheduled I will see when needed going forward  Current medicines are reviewed at length with the patient today.   The patient does not have concerns regarding her medicines.  The following changes were made today:  none  Today, I have spent 25 minutes with the patient discussing afib management .  More than 50% of the visit time today was spent on this issue.    Army Fossa, MD  09/08/2015 8:26 PM     [address] [place] [id number] [phone] (office) [phone] (fax)

## 2015-09-10 ENCOUNTER — Encounter (HOSPITAL_COMMUNITY): Payer: Self-pay | Admitting: Cardiology

## 2015-09-10 ENCOUNTER — Ambulatory Visit: Payer: Medicare Other | Admitting: Internal Medicine

## 2015-09-10 NOTE — Addendum Note (Signed)
Addendum  created 09/10/15 1501 by Oleta Mouse, MD   Modules edited: Anesthesia Events, Anesthesia Responsible Staff

## 2015-09-18 ENCOUNTER — Ambulatory Visit (INDEPENDENT_AMBULATORY_CARE_PROVIDER_SITE_OTHER): Payer: Medicare Other | Admitting: General Practice

## 2015-09-18 DIAGNOSIS — Z5181 Encounter for therapeutic drug level monitoring: Secondary | ICD-10-CM

## 2015-09-18 LAB — POCT INR: INR: 6.8

## 2015-09-18 NOTE — Progress Notes (Signed)
Agree with coumadin management. 

## 2015-09-18 NOTE — Progress Notes (Signed)
Pre visit review using our clinic review tool, if applicable. No additional management support is needed unless otherwise documented below in the visit note. 

## 2015-09-25 ENCOUNTER — Ambulatory Visit: Payer: Medicare Other

## 2015-10-01 ENCOUNTER — Ambulatory Visit (INDEPENDENT_AMBULATORY_CARE_PROVIDER_SITE_OTHER): Payer: Medicare Other | Admitting: General Practice

## 2015-10-01 ENCOUNTER — Ambulatory Visit: Payer: Medicare Other

## 2015-10-01 DIAGNOSIS — L280 Lichen simplex chronicus: Secondary | ICD-10-CM | POA: Diagnosis not present

## 2015-10-01 DIAGNOSIS — Z5181 Encounter for therapeutic drug level monitoring: Secondary | ICD-10-CM | POA: Diagnosis not present

## 2015-10-01 LAB — POCT INR: INR: 2.1

## 2015-10-01 NOTE — Progress Notes (Signed)
I have reviewed and agree with the plan. 

## 2015-10-01 NOTE — Progress Notes (Signed)
Pre visit review using our clinic review tool, if applicable. No additional management support is needed unless otherwise documented below in the visit note. 

## 2015-10-24 ENCOUNTER — Ambulatory Visit (INDEPENDENT_AMBULATORY_CARE_PROVIDER_SITE_OTHER): Payer: Medicare Other | Admitting: General Practice

## 2015-10-24 DIAGNOSIS — I4891 Unspecified atrial fibrillation: Secondary | ICD-10-CM | POA: Diagnosis not present

## 2015-10-24 DIAGNOSIS — Z5181 Encounter for therapeutic drug level monitoring: Secondary | ICD-10-CM | POA: Diagnosis not present

## 2015-10-24 LAB — POCT INR: INR: 3.1

## 2015-10-24 NOTE — Progress Notes (Signed)
Pre visit review using our clinic review tool, if applicable. No additional management support is needed unless otherwise documented below in the visit note. 

## 2015-10-24 NOTE — Progress Notes (Signed)
I have reviewed and agree with the plan. 

## 2015-10-27 DIAGNOSIS — N3020 Other chronic cystitis without hematuria: Secondary | ICD-10-CM | POA: Diagnosis not present

## 2015-10-27 DIAGNOSIS — N3941 Urge incontinence: Secondary | ICD-10-CM | POA: Diagnosis not present

## 2015-11-03 DIAGNOSIS — L280 Lichen simplex chronicus: Secondary | ICD-10-CM | POA: Diagnosis not present

## 2015-11-24 ENCOUNTER — Ambulatory Visit (INDEPENDENT_AMBULATORY_CARE_PROVIDER_SITE_OTHER): Payer: Medicare Other | Admitting: General Practice

## 2015-11-24 DIAGNOSIS — Z5181 Encounter for therapeutic drug level monitoring: Secondary | ICD-10-CM

## 2015-11-24 DIAGNOSIS — I4891 Unspecified atrial fibrillation: Secondary | ICD-10-CM | POA: Diagnosis not present

## 2015-11-24 LAB — POCT INR: INR: 3.5

## 2015-12-10 ENCOUNTER — Ambulatory Visit (INDEPENDENT_AMBULATORY_CARE_PROVIDER_SITE_OTHER): Payer: Medicare Other | Admitting: Nurse Practitioner

## 2015-12-10 ENCOUNTER — Encounter: Payer: Self-pay | Admitting: Nurse Practitioner

## 2015-12-10 VITALS — BP 120/70 | HR 68 | Ht 62.0 in | Wt 209.0 lb

## 2015-12-10 DIAGNOSIS — Z79899 Other long term (current) drug therapy: Secondary | ICD-10-CM

## 2015-12-10 DIAGNOSIS — Z7901 Long term (current) use of anticoagulants: Secondary | ICD-10-CM

## 2015-12-10 DIAGNOSIS — I480 Paroxysmal atrial fibrillation: Secondary | ICD-10-CM | POA: Diagnosis not present

## 2015-12-10 DIAGNOSIS — I5032 Chronic diastolic (congestive) heart failure: Secondary | ICD-10-CM

## 2015-12-10 DIAGNOSIS — I4891 Unspecified atrial fibrillation: Secondary | ICD-10-CM | POA: Diagnosis not present

## 2015-12-10 LAB — BASIC METABOLIC PANEL
BUN: 24 mg/dL (ref 7–25)
CO2: 26 mmol/L (ref 20–31)
Calcium: 9.5 mg/dL (ref 8.6–10.4)
Chloride: 106 mmol/L (ref 98–110)
Creat: 1.21 mg/dL — ABNORMAL HIGH (ref 0.60–0.88)
Glucose, Bld: 83 mg/dL (ref 65–99)
Potassium: 4 mmol/L (ref 3.5–5.3)
Sodium: 141 mmol/L (ref 135–146)

## 2015-12-10 LAB — CBC
HCT: 46.9 % — ABNORMAL HIGH (ref 36.0–46.0)
Hemoglobin: 15.5 g/dL — ABNORMAL HIGH (ref 12.0–15.0)
MCH: 33 pg (ref 26.0–34.0)
MCHC: 33 g/dL (ref 30.0–36.0)
MCV: 100 fL (ref 78.0–100.0)
MPV: 11.6 fL (ref 8.6–12.4)
Platelets: 158 10*3/uL (ref 150–400)
RBC: 4.69 MIL/uL (ref 3.87–5.11)
RDW: 14.5 % (ref 11.5–15.5)
WBC: 7.7 10*3/uL (ref 4.0–10.5)

## 2015-12-10 LAB — HEPATIC FUNCTION PANEL
ALT: 24 U/L (ref 6–29)
AST: 22 U/L (ref 10–35)
Albumin: 4.3 g/dL (ref 3.6–5.1)
Alkaline Phosphatase: 49 U/L (ref 33–130)
Bilirubin, Direct: 0.2 mg/dL (ref <=0.2)
Indirect Bilirubin: 1.1 mg/dL (ref 0.2–1.2)
Total Bilirubin: 1.3 mg/dL — ABNORMAL HIGH (ref 0.2–1.2)
Total Protein: 6.5 g/dL (ref 6.1–8.1)

## 2015-12-10 MED ORDER — FUROSEMIDE 40 MG PO TABS: 20.0000 mg | ORAL_TABLET | Freq: Every morning | ORAL | Status: DC

## 2015-12-10 MED ORDER — POTASSIUM CHLORIDE CRYS ER 20 MEQ PO TBCR: 20.0000 meq | EXTENDED_RELEASE_TABLET | Freq: Every day | ORAL | Status: DC

## 2015-12-10 MED ORDER — AMIODARONE HCL 200 MG PO TABS: 200.0000 mg | ORAL_TABLET | Freq: Every day | ORAL | Status: DC

## 2015-12-10 MED ORDER — SPIRONOLACTONE 25 MG PO TABS: 25.0000 mg | ORAL_TABLET | Freq: Every day | ORAL | Status: DC

## 2015-12-10 NOTE — Progress Notes (Signed)
CARDIOLOGY OFFICE NOTE  Date:  12/10/2015    Angel Meyer Date of Birth: 1933-04-26 Medical Record [id number]  PCP:  [name], MD  Cardiologist:  Allred    Chief Complaint  Patient presents with  . Atrial Fibrillation  . Hypertension    History of Present Illness: Angel Meyer is a 82 y.o. female who presents today for a follow up visit. Seen for Dr. Rayann Heman.   She has PAF, rheumatic fever at age 42, HTN and OSA. She has a lung nodule that is followed by pulmonary which resolved. She failed on Flecainide and Tikosyn and is now on amiodarone. Therapy has been limited by her HR. She is previously from [place] and has now moved here with her daughter. She has had some progressive memory issues. She remains on coumadin anticoagulation.   Saw me in November - found to be back in AF - was symptomatic - sent her for a cardioversion and back to EP. Amiodarone increased short term.   Saw Dr. Rayann Heman in December - poor candidate for ablation -and they have elected for conservative therapy.   Comes back today. Here with her daughter. Needs refills today. She remains pretty sedentary. Looks to her daughter for answers. May be a bit more short of breath. Swelling is stable. No chest pain. Not really dizzy or lightheaded. No problems with her rhythm that they are aware of. No falls. Amiodarone is back to just once a day.   Past Medical History  Diagnosis Date  . Arthritis   . Persistent atrial fibrillation (HCC)     failed on Flecainide and Tikosyn  . Renal calculi   . Hypertension   . Hyperlipidemia   . Rheumatic fever     age 27  . History of blood transfusion   . Incontinence of urine   . Diastolic dysfunction     preserved EF,  CHF In setting of afib previously  . Osteoporosis   . OSA (obstructive sleep apnea)     uses CPAP  . Restless legs   . Systolic heart failure (HCC)     EF 30 to 35% per echo 08/2013  . Dysrhythmia     afib    Past Surgical History    Procedure Laterality Date  . Breast surgery      benign biopsy  . Tonsillectomy    . Abdominal hysterectomy    . Spine surgery      laminectomy 1965, spinal fusion with rod 2005  . Replacement total knee  2005  . Kidney stone surgery      1997 removed  . Cardioversion N/A 09/10/2013    Procedure: CARDIOVERSION;  Surgeon: Thompson Grayer, MD;  Location: [facility];  Service: Cardiovascular;  Laterality: N/A;  . Cardioversion N/A 11/16/2013    Procedure: CARDIOVERSION;  Surgeon: Sueanne Margarita, MD;  Location: [facility] END[facility]COPY;  Service: Cardiovascular;  Laterality: N/A;  . Cardioversion N/A 08/18/2015    Procedure: CARDIOVERSION;  Surgeon: Sueanne Margarita, MD;  Location: [facility] ENDOSCOPY;  Service: Cardiovascular;  Laterality: N/A;     Medications: Current Outpatient Prescriptions  Medication Sig Dispense Refill  . amiodarone (PACERONE) 200 MG tablet Take 1 tablet (200 mg total) by mouth daily. 90 tablet 3  . clobetasol cream (TEMOVATE) AB-123456789 % Apply 1 application topically 2 (two) times daily. For lichens simplex chronicus    . diltiazem (CARDIZEM) 30 MG tablet Take 1 tablet (30 mg total) by mouth every 6 (six) hours as needed (AFIB w/pulse >  100 bpm until resolved). 30 tablet 3  . donepezil (ARICEPT ODT) 10 MG disintegrating tablet Take 1 tablet (10 mg total) by mouth at bedtime. 90 tablet 3  . escitalopram (LEXAPRO) 10 MG tablet Take 1 tablet (10 mg total) by mouth daily. 90 tablet 3  . fluocinonide ointment (LIDEX) 0.05 % APPLY ON THE SKIN BID for chronic rash  3  . furosemide (LASIX) 40 MG tablet Take 0.5 tablets (20 mg total) by mouth every morning. 45 tablet 3  . Melatonin 5 MG TABS Take 5 mg by mouth at bedtime.    . memantine (NAMENDA) 10 MG tablet Take 1 tablet (10 mg total) by mouth 2 (two) times daily. 180 tablet 3  . Multiple Vitamins-Minerals (PRESERVISION AREDS 2) CAPS Take 1 capsule by mouth 2 (two) times daily.    . nitroGLYCERIN (NITROSTAT) 0.4 MG SL tablet Place 1 tablet (0.4 mg total)  under the tongue every 5 (five) minutes as needed for chest pain. 25 tablet 3  . oxybutynin (DITROPAN-XL) 10 MG 24 hr tablet Take 10 mg by mouth daily.   11  . potassium chloride SA (K-DUR,KLOR-CON) 20 MEQ tablet Take 1 tablet (20 mEq total) by mouth daily. 90 tablet 3  . rOPINIRole (REQUIP) 1 MG tablet Take 1 tablet (1 mg total) by mouth See admin instructions. Take 1 tablet (1 mg) by mouth daily at 4pm and at bedtime 180 tablet 3  . rosuvastatin (CRESTOR) 40 MG tablet Take 40 mg by mouth at bedtime.    Marland Kitchen spironolactone (ALDACTONE) 25 MG tablet Take 1 tablet (25 mg total) by mouth daily. 90 tablet 3  . trimethoprim (TRIMPEX) 100 MG tablet Take 100 mg by mouth daily.   11  . warfarin (COUMADIN) 2.5 MG tablet Take 1-2 tablets (2.5-5 mg total) by mouth daily before supper. Daily at 4pm:  Take 2 tablets (5 mg) by mouth on Sunday and Wednesday, take 1 tablet (2.5 mg) on Monday, Tuesday, Thursday, Friday and Saturday or as directed by anticoagulation clinic 30 tablet 0   No current facility-administered medications for this visit.    Allergies: Allergies  Allergen Reactions  . Ciprofloxacin Nausea And Vomiting  . Demerol [Meperidine] Nausea And Vomiting  . Sulfa Antibiotics Nausea And Vomiting    Social History: The patient  reports that she has never smoked. She does not have any smokeless tobacco history on file. She reports that she drinks alcohol. She reports that she does not use illicit drugs.   Family History: The patient's family history includes Cancer in her mother and paternal grandfather; Diabetes in her mother.   Review of Systems: Please see the history of present illness.   Otherwise, the review of systems is positive for none.   All other systems are reviewed and negative.   Physical Exam: VS:  BP 120/70 mmHg  Pulse 68  Ht 5\' 2"  (1.575 m)  Wt 209 lb (94.802 kg)  BMI 38.22 kg/m2 .  BMI Body mass index is 38.22 kg/(m^2).  Wt Readings from Last 3 Encounters:  12/10/15 209  lb (94.802 kg)  09/03/15 205 lb (92.987 kg)  08/18/15 205 lb (92.987 kg)    General: Pleasant. Elderly female. She is alert and in no acute distress. She remains obese - weight is up 4 more pounds. HEENT: Normal. Neck: Supple, no JVD, carotid bruits, or masses noted.  Cardiac: Regular rate and rhythm. No murmurs, rubs, or gallops. No edema.  Respiratory:  Lungs are clear to auscultation bilaterally with normal  work of breathing.  GI: Soft and nontender.  MS: No deformity or atrophy. Gait and ROM intact. Skin: Warm and dry. Color is normal.  Neuro:  Strength and sensation are intact and no gross focal deficits noted.  Psych: Alert, appropriate and with normal affect.   LABORATORY DATA:  EKG:  EKG is not ordered today.  Lab Results  Component Value Date   WBC 6.2 08/13/2015   HGB 15.5* 08/13/2015   HCT 45.8 08/13/2015   PLT 189 08/13/2015   GLUCOSE 93 08/13/2015   CHOL 201* 08/13/2015   TRIG 133 08/13/2015   HDL 75 08/13/2015   LDLCALC 99 08/13/2015   ALT 16 08/13/2015   AST 21 08/13/2015   NA 136 08/13/2015   K 4.1 08/13/2015   CL 102 08/13/2015   CREATININE 1.08* 08/13/2015   BUN 19 08/13/2015   CO2 24 08/13/2015   TSH 2.446 08/13/2015   INR 3.5 11/24/2015    Lab Results  Component Value Date   INR 3.5 11/24/2015   INR 3.1 10/24/2015   INR 2.1 10/01/2015     BNP (last 3 results) No results for input(s): BNP in the last 8760 hours.  ProBNP (last 3 results) No results for input(s): PROBNP in the last 8760 hours.   Other Studies Reviewed Today:  Myoview Impression from February 2015  Exercise Capacity: [place] with no exercise.  BP Response: Hypotensive blood pressure response.  Clinical Symptoms: There is chest heaviness.  ECG Impression: No significant ST segment change suggestive of ischemia.  Comparison with Prior Nuclear Study: No images to compare  Overall Impression: Normal stress nuclear study.  LV Ejection Fraction: Study not gated. LV  Wall Motion: NA  Kirk Ruths   Echo Study Conclusions from 08/2015  - Left ventricle: The cavity size was normal. Wall thickness was  normal. Systolic function was normal. The estimated ejection  fraction was in the range of 55% to 60%. Wall motion was normal;  there were no regional wall motion abnormalities. Doppler  parameters are consistent with abnormal left ventricular  relaxation (grade 1 diastolic dysfunction). - Mitral valve: Valve area by pressure half-time: 1.48 cm^2. Valve  area by continuity equation (using LVOT flow): 1.08 cm^2. - Pulmonary arteries: Systolic pressure was mildly increased. PA  peak pressure: 35 mm Hg (S).   Assessment / Plan:  1. Atrial fib - on amiodarone - with prior cardioversion back in November of 2016 - poor candidate for ablation - to be managed conservatively. Needs surveillance labs today. I have left her on her current regimen. Remains in NSR by exam today.   2. LV Dysfunction - EF improved on last echo.   3. Chronic anticoagulation - no problems noted.   4. HTN - BP ok on current regimen.  5. Memory issues - on medical therapy. She is pretty sedentary and seems to be in a gradual decline.   Current medicines are reviewed with the patient today.  The patient does not have concerns regarding medicines other than what has been noted above.  The following changes have been made:  See above.  Labs/ tests ordered today include:   No orders of the defined types were placed in this encounter.     Disposition:   FU with Dr. Rayann Heman and his team in 6 months.   Patient is agreeable to this plan and will call if any problems develop in the interim.   Signed: Burtis Junes, RN, ANP-C 12/10/2015 11:31 AM  [facility]  [address] [place], [place]  [address] Phone: [phone] Fax: [phone]

## 2015-12-10 NOTE — Patient Instructions (Addendum)
We will be checking the following labs today - BMET, CBC, HPF, TSH   Medication Instructions:    Continue with your current medicines.     Testing/Procedures To Be Arranged:  N/A  Follow-Up:   See Dr. Rayann Heman and his team in 6 months    Other Special Instructions:   N/A    If you need a refill on your cardiac medications before your next appointment, please call your pharmacy.   Call the [place] office at [phone] if you have any questions, problems or concerns.

## 2015-12-10 NOTE — Addendum Note (Signed)
Addended by: Burtis Junes on: 12/10/2015 11:34 AM   Modules accepted: Orders, SmartSet

## 2015-12-11 ENCOUNTER — Other Ambulatory Visit: Payer: Self-pay | Admitting: General Practice

## 2015-12-11 ENCOUNTER — Other Ambulatory Visit: Payer: Self-pay | Admitting: *Deleted

## 2015-12-11 DIAGNOSIS — R7989 Other specified abnormal findings of blood chemistry: Secondary | ICD-10-CM

## 2015-12-11 LAB — TSH: TSH: 3.27 mIU/L

## 2015-12-11 MED ORDER — WARFARIN SODIUM 2.5 MG PO TABS: ORAL_TABLET | ORAL | Status: DC

## 2015-12-17 ENCOUNTER — Encounter: Payer: Self-pay | Admitting: Family Medicine

## 2015-12-17 ENCOUNTER — Ambulatory Visit (INDEPENDENT_AMBULATORY_CARE_PROVIDER_SITE_OTHER): Payer: Medicare Other | Admitting: Family Medicine

## 2015-12-17 VITALS — BP 132/70 | HR 66 | Ht 62.0 in | Wt 208.9 lb

## 2015-12-17 DIAGNOSIS — I10 Essential (primary) hypertension: Secondary | ICD-10-CM | POA: Diagnosis not present

## 2015-12-17 DIAGNOSIS — R4189 Other symptoms and signs involving cognitive functions and awareness: Secondary | ICD-10-CM | POA: Diagnosis not present

## 2015-12-17 DIAGNOSIS — R748 Abnormal levels of other serum enzymes: Secondary | ICD-10-CM

## 2015-12-17 DIAGNOSIS — E785 Hyperlipidemia, unspecified: Secondary | ICD-10-CM | POA: Diagnosis not present

## 2015-12-17 DIAGNOSIS — R7989 Other specified abnormal findings of blood chemistry: Secondary | ICD-10-CM

## 2015-12-17 DIAGNOSIS — G4700 Insomnia, unspecified: Secondary | ICD-10-CM

## 2015-12-17 DIAGNOSIS — Z9181 History of falling: Secondary | ICD-10-CM

## 2015-12-17 LAB — BASIC METABOLIC PANEL
BUN: 22 mg/dL (ref 6–23)
CALCIUM: 9.7 mg/dL (ref 8.4–10.5)
CHLORIDE: 106 meq/L (ref 96–112)
CO2: 24 mEq/L (ref 19–32)
CREATININE: 1.1 mg/dL (ref 0.40–1.20)
GFR: 50.73 mL/min — AB (ref >60.00)
Glucose, Bld: 74 mg/dL (ref 70–99)
Potassium: 4.1 mEq/L (ref 3.5–5.1)
Sodium: 141 mEq/L (ref 135–145)

## 2015-12-17 MED ORDER — ESCITALOPRAM OXALATE 10 MG PO TABS: 10.0000 mg | ORAL_TABLET | Freq: Every day | ORAL | Status: DC

## 2015-12-17 NOTE — Progress Notes (Signed)
Pre visit review using our clinic review tool, if applicable. No additional management support is needed unless otherwise documented below in the visit note. 

## 2015-12-17 NOTE — Progress Notes (Signed)
Subjective:    Patient ID: Angel Meyer, female    DOB: [birth year], 82 y.o.   MRN: [id number]  HPI Patient here for medical follow-up. Several items discussed  History of cognitive impairment and probable early dementia. Patient currently takes Namenda and Aricept. She actually did fairly well on last Mini-Mental status exam. We plan repeat today. Daughter thinks she is relatively stable clinically  Insomnia. Difficulty falling asleep. No late day use of caffeine. Drinks 1 glass of wine at night. No daytime naps. Improved with melatonin. Denies any night pain or other factors.  Recent mildly elevated creatinine through cardiology with creatinine 1.2. Requesting repeat labs today. She takes low-dose furosemide.  Ambulates with cane. No recent falls. Daughter has noted that when she walks or grocery store with her car she seems more steady and more facile with walking. Daughter has questions whether she would benefit from a walker.  Past Medical History  Diagnosis Date  . Arthritis   . Persistent atrial fibrillation (HCC)     failed on Flecainide and Tikosyn  . Renal calculi   . Hypertension   . Hyperlipidemia   . Rheumatic fever     age 6  . History of blood transfusion   . Incontinence of urine   . Diastolic dysfunction     preserved EF,  CHF In setting of afib previously  . Osteoporosis   . OSA (obstructive sleep apnea)     uses CPAP  . Restless legs   . Systolic heart failure (HCC)     EF 30 to 35% per echo 08/2013  . Dysrhythmia     afib   Past Surgical History  Procedure Laterality Date  . Breast surgery      benign biopsy  . Tonsillectomy    . Abdominal hysterectomy    . Spine surgery      laminectomy 1965, spinal fusion with rod 2005  . Replacement total knee  2005  . Kidney stone surgery      1997 removed  . Cardioversion N/A 09/10/2013    Procedure: CARDIOVERSION;  Surgeon: Thompson Grayer, MD;  Location: [place];  Service: Cardiovascular;  Laterality: N/A;  .  Cardioversion N/A 11/16/2013    Procedure: CARDIOVERSION;  Surgeon: Sueanne Margarita, MD;  Location: [facility] ENDOSCOPY;  Service: Cardiovascular;  Laterality: N/A;  . Cardioversion N/A 08/18/2015    Procedure: CARDIOVERSION;  Surgeon: Sueanne Margarita, MD;  Location: [facility]COPY;  Service: Cardiovascular;  Laterality: N/A;    reports that she has never smoked. She does not have any smokeless tobacco history on file. She reports that she drinks alcohol. She reports that she does not use illicit drugs. family history includes Cancer in her mother and paternal grandfather; Diabetes in her mother. Allergies  Allergen Reactions  . Ciprofloxacin Nausea And Vomiting  . Demerol [Meperidine] Nausea And Vomiting  . Sulfa Antibiotics Nausea And Vomiting      Review of Systems  Constitutional: Negative for fatigue and unexpected weight change.  Eyes: Negative for visual disturbance.  Respiratory: Negative for cough, chest tightness, shortness of breath and wheezing.   Cardiovascular: Negative for chest pain, palpitations and leg swelling.  Genitourinary: Negative for dysuria.  Neurological: Positive for light-headedness. Negative for dizziness, seizures, syncope, weakness and headaches.  Psychiatric/Behavioral: Negative for dysphoric mood and agitation.       Objective:   Physical Exam  Constitutional: She appears well-developed and well-nourished.  Neck: Neck supple. No thyromegaly present.  Cardiovascular: Normal rate and regular rhythm.  Pulmonary/Chest: Effort normal and breath sounds normal. No respiratory distress. She has no wheezes. She has no rales.  Musculoskeletal: She exhibits no edema.  Neurological: She is alert. No cranial nerve deficit.  Psychiatric:  Mini-Mental status exam 28 out of 30          Assessment & Plan:  #1 cognitive impairment. Mini-Mental Status exam 28/30. This is stable. Continue current medications  #2 hypertension. Initial high reading here but improved at  repeat. Continue monitoring  #3 mildly elevated creatinine by recent labs per cardiology. Recheck basic metabolic panel  #4 increased risk of falls. Set up physical therapy for outpatient evaluation. Continue ambulation with cane.  #5 insomnia. Sleep hygiene discussed. Avoid alcohol at night. Continue melatonin which seems to help. Handout given

## 2015-12-17 NOTE — Patient Instructions (Signed)
Insomnia Insomnia is a sleep disorder that makes it difficult to fall asleep or to stay asleep. Insomnia can cause tiredness (fatigue), low energy, difficulty concentrating, mood swings, and poor performance at work or school.  There are three different ways to classify insomnia:  Difficulty falling asleep.  Difficulty staying asleep.  Waking up too early in the morning. Any type of insomnia can be long-term (chronic) or short-term (acute). Both are common. Short-term insomnia usually lasts for three months or less. Chronic insomnia occurs at least three times a week for longer than three months. CAUSES  Insomnia may be caused by another condition, situation, or substance, such as:  Anxiety.  Certain medicines.  Gastroesophageal reflux disease (GERD) or other gastrointestinal conditions.  Asthma or other breathing conditions.  Restless legs syndrome, sleep apnea, or other sleep disorders.  Chronic pain.  Menopause. This may include hot flashes.  Stroke.  Abuse of alcohol, tobacco, or illegal drugs.  Depression.  Caffeine.   Neurological disorders, such as Alzheimer disease.  An overactive thyroid (hyperthyroidism). The cause of insomnia may not be known. RISK FACTORS Risk factors for insomnia include:  Gender. Women are more commonly affected than men.  Age. Insomnia is more common as you get older.  Stress. This may involve your professional or personal life.  Income. Insomnia is more common in people with lower income.  Lack of exercise.   Irregular work schedule or night shifts.  Traveling between different time zones. SIGNS AND SYMPTOMS If you have insomnia, trouble falling asleep or trouble staying asleep is the main symptom. This may lead to other symptoms, such as:  Feeling fatigued.  Feeling nervous about going to sleep.  Not feeling rested in the morning.  Having trouble concentrating.  Feeling irritable, anxious, or depressed. TREATMENT   Treatment for insomnia depends on the cause. If your insomnia is caused by an underlying condition, treatment will focus on addressing the condition. Treatment may also include:   Medicines to help you sleep.  Counseling or therapy.  Lifestyle adjustments. HOME CARE INSTRUCTIONS   Take medicines only as directed by your health care provider.  Keep regular sleeping and waking hours. Avoid naps.  Keep a sleep diary to help you and your health care provider figure out what could be causing your insomnia. Include:   When you sleep.  When you wake up during the night.  How well you sleep.   How rested you feel the next day.  Any side effects of medicines you are taking.  What you eat and drink.   Make your bedroom a comfortable place where it is easy to fall asleep:  Put up shades or special blackout curtains to block light from outside.  Use a white noise machine to block noise.  Keep the temperature cool.   Exercise regularly as directed by your health care provider. Avoid exercising right before bedtime.  Use relaxation techniques to manage stress. Ask your health care provider to suggest some techniques that may work well for you. These may include:  Breathing exercises.  Routines to release muscle tension.  Visualizing peaceful scenes.  Cut back on alcohol, caffeinated beverages, and cigarettes, especially close to bedtime. These can disrupt your sleep.  Do not overeat or eat spicy foods right before bedtime. This can lead to digestive discomfort that can make it hard for you to sleep.  Limit screen use before bedtime. This includes:  Watching TV.  Using your smartphone, tablet, and computer.  Stick to a routine. This   can help you fall asleep faster. Try to do a quiet activity, brush your teeth, and go to bed at the same time each night.  Get out of bed if you are still awake after 15 minutes of trying to sleep. Keep the lights down, but try reading or  doing a quiet activity. When you feel sleepy, go back to bed.  Make sure that you drive carefully. Avoid driving if you feel very sleepy.  Keep all follow-up appointments as directed by your health care provider. This is important. SEEK MEDICAL CARE IF:   You are tired throughout the day or have trouble in your daily routine due to sleepiness.  You continue to have sleep problems or your sleep problems get worse. SEEK IMMEDIATE MEDICAL CARE IF:   You have serious thoughts about hurting yourself or someone else.   This information is not intended to replace advice given to you by your health care provider. Make sure you discuss any questions you have with your health care provider.   Document Released: 09/10/2000 Document Revised: 06/04/2015 Document Reviewed: 06/14/2014 Elsevier Interactive Patient Education 2016 Elsevier Inc.  

## 2015-12-29 ENCOUNTER — Ambulatory Visit (INDEPENDENT_AMBULATORY_CARE_PROVIDER_SITE_OTHER): Payer: Medicare Other | Admitting: General Practice

## 2015-12-29 DIAGNOSIS — I4891 Unspecified atrial fibrillation: Secondary | ICD-10-CM | POA: Diagnosis not present

## 2015-12-29 DIAGNOSIS — Z5181 Encounter for therapeutic drug level monitoring: Secondary | ICD-10-CM

## 2015-12-29 LAB — POCT INR: INR: 1.9

## 2015-12-29 NOTE — Progress Notes (Signed)
Pre visit review using our clinic review tool, if applicable. No additional management support is needed unless otherwise documented below in the visit note. 

## 2015-12-30 DIAGNOSIS — R2689 Other abnormalities of gait and mobility: Secondary | ICD-10-CM | POA: Diagnosis not present

## 2015-12-30 DIAGNOSIS — Z9181 History of falling: Secondary | ICD-10-CM | POA: Diagnosis not present

## 2016-01-12 ENCOUNTER — Telehealth: Payer: Self-pay | Admitting: Family Medicine

## 2016-01-12 DIAGNOSIS — Z9181 History of falling: Secondary | ICD-10-CM

## 2016-01-12 MED ORDER — ROLLER WALKER MISC: Status: AC

## 2016-01-12 NOTE — Telephone Encounter (Signed)
Pt daughter call to say had her her evaluation and does qualify for a walker. Daughter is requesting that a rx for a walker be sent to [facility]

## 2016-01-12 NOTE — Telephone Encounter (Signed)
Printed and Faxed to [facility].

## 2016-01-13 DIAGNOSIS — R2689 Other abnormalities of gait and mobility: Secondary | ICD-10-CM | POA: Diagnosis not present

## 2016-01-13 DIAGNOSIS — Z9181 History of falling: Secondary | ICD-10-CM | POA: Diagnosis not present

## 2016-01-14 DIAGNOSIS — L280 Lichen simplex chronicus: Secondary | ICD-10-CM | POA: Diagnosis not present

## 2016-01-15 DIAGNOSIS — R2689 Other abnormalities of gait and mobility: Secondary | ICD-10-CM | POA: Diagnosis not present

## 2016-01-15 DIAGNOSIS — Z9181 History of falling: Secondary | ICD-10-CM | POA: Diagnosis not present

## 2016-01-20 DIAGNOSIS — R2689 Other abnormalities of gait and mobility: Secondary | ICD-10-CM | POA: Diagnosis not present

## 2016-01-20 DIAGNOSIS — Z9181 History of falling: Secondary | ICD-10-CM | POA: Diagnosis not present

## 2016-01-22 DIAGNOSIS — Z9181 History of falling: Secondary | ICD-10-CM | POA: Diagnosis not present

## 2016-01-22 DIAGNOSIS — R2689 Other abnormalities of gait and mobility: Secondary | ICD-10-CM | POA: Diagnosis not present

## 2016-01-26 ENCOUNTER — Ambulatory Visit (INDEPENDENT_AMBULATORY_CARE_PROVIDER_SITE_OTHER): Payer: Medicare Other | Admitting: General Practice

## 2016-01-26 DIAGNOSIS — Z5181 Encounter for therapeutic drug level monitoring: Secondary | ICD-10-CM

## 2016-01-26 DIAGNOSIS — I4891 Unspecified atrial fibrillation: Secondary | ICD-10-CM

## 2016-01-26 LAB — POCT INR: INR: 1.1

## 2016-01-26 NOTE — Progress Notes (Signed)
Pre visit review using our clinic review tool, if applicable. No additional management support is needed unless otherwise documented below in the visit note. 

## 2016-01-27 DIAGNOSIS — Z9181 History of falling: Secondary | ICD-10-CM | POA: Diagnosis not present

## 2016-01-27 DIAGNOSIS — R2689 Other abnormalities of gait and mobility: Secondary | ICD-10-CM | POA: Diagnosis not present

## 2016-02-02 DIAGNOSIS — Z9181 History of falling: Secondary | ICD-10-CM | POA: Diagnosis not present

## 2016-02-02 DIAGNOSIS — R2689 Other abnormalities of gait and mobility: Secondary | ICD-10-CM | POA: Diagnosis not present

## 2016-02-04 DIAGNOSIS — R2689 Other abnormalities of gait and mobility: Secondary | ICD-10-CM | POA: Diagnosis not present

## 2016-02-04 DIAGNOSIS — Z9181 History of falling: Secondary | ICD-10-CM | POA: Diagnosis not present

## 2016-02-09 ENCOUNTER — Ambulatory Visit (INDEPENDENT_AMBULATORY_CARE_PROVIDER_SITE_OTHER): Payer: Medicare Other | Admitting: General Practice

## 2016-02-09 DIAGNOSIS — Z9181 History of falling: Secondary | ICD-10-CM | POA: Diagnosis not present

## 2016-02-09 DIAGNOSIS — Z5181 Encounter for therapeutic drug level monitoring: Secondary | ICD-10-CM

## 2016-02-09 DIAGNOSIS — I4891 Unspecified atrial fibrillation: Secondary | ICD-10-CM | POA: Diagnosis not present

## 2016-02-09 DIAGNOSIS — R2689 Other abnormalities of gait and mobility: Secondary | ICD-10-CM | POA: Diagnosis not present

## 2016-02-09 LAB — POCT INR: INR: 3.4

## 2016-02-09 NOTE — Progress Notes (Signed)
Pre visit review using our clinic review tool, if applicable. No additional management support is needed unless otherwise documented below in the visit note. 

## 2016-02-13 DIAGNOSIS — R2689 Other abnormalities of gait and mobility: Secondary | ICD-10-CM | POA: Diagnosis not present

## 2016-02-13 DIAGNOSIS — Z9181 History of falling: Secondary | ICD-10-CM | POA: Diagnosis not present

## 2016-02-16 DIAGNOSIS — R2689 Other abnormalities of gait and mobility: Secondary | ICD-10-CM | POA: Diagnosis not present

## 2016-02-16 DIAGNOSIS — Z9181 History of falling: Secondary | ICD-10-CM | POA: Diagnosis not present

## 2016-02-20 DIAGNOSIS — Z9181 History of falling: Secondary | ICD-10-CM | POA: Diagnosis not present

## 2016-02-20 DIAGNOSIS — R2689 Other abnormalities of gait and mobility: Secondary | ICD-10-CM | POA: Diagnosis not present

## 2016-02-24 DIAGNOSIS — R2689 Other abnormalities of gait and mobility: Secondary | ICD-10-CM | POA: Diagnosis not present

## 2016-02-24 DIAGNOSIS — Z9181 History of falling: Secondary | ICD-10-CM | POA: Diagnosis not present

## 2016-02-26 DIAGNOSIS — R2689 Other abnormalities of gait and mobility: Secondary | ICD-10-CM | POA: Diagnosis not present

## 2016-02-26 DIAGNOSIS — Z9181 History of falling: Secondary | ICD-10-CM | POA: Diagnosis not present

## 2016-03-03 ENCOUNTER — Other Ambulatory Visit: Payer: Self-pay | Admitting: General Practice

## 2016-03-03 ENCOUNTER — Ambulatory Visit (INDEPENDENT_AMBULATORY_CARE_PROVIDER_SITE_OTHER): Payer: Medicare Other | Admitting: General Practice

## 2016-03-03 DIAGNOSIS — I4891 Unspecified atrial fibrillation: Secondary | ICD-10-CM

## 2016-03-03 DIAGNOSIS — Z5181 Encounter for therapeutic drug level monitoring: Secondary | ICD-10-CM

## 2016-03-03 LAB — POCT INR: INR: 3.5

## 2016-03-03 MED ORDER — WARFARIN SODIUM 2.5 MG PO TABS: ORAL_TABLET | ORAL | Status: DC

## 2016-03-03 NOTE — Progress Notes (Signed)
Pre visit review using our clinic review tool, if applicable. No additional management support is needed unless otherwise documented below in the visit note. 

## 2016-03-31 ENCOUNTER — Ambulatory Visit (INDEPENDENT_AMBULATORY_CARE_PROVIDER_SITE_OTHER): Payer: Medicare Other | Admitting: General Practice

## 2016-03-31 DIAGNOSIS — Z5181 Encounter for therapeutic drug level monitoring: Secondary | ICD-10-CM | POA: Diagnosis not present

## 2016-03-31 DIAGNOSIS — I4891 Unspecified atrial fibrillation: Secondary | ICD-10-CM | POA: Diagnosis not present

## 2016-03-31 LAB — POCT INR: INR: 3.5

## 2016-03-31 NOTE — Progress Notes (Signed)
Pre visit review using our clinic review tool, if applicable. No additional management support is needed unless otherwise documented below in the visit note. 

## 2016-04-01 DIAGNOSIS — Z9181 History of falling: Secondary | ICD-10-CM | POA: Diagnosis not present

## 2016-04-01 DIAGNOSIS — R2689 Other abnormalities of gait and mobility: Secondary | ICD-10-CM | POA: Diagnosis not present

## 2016-04-12 ENCOUNTER — Ambulatory Visit (INDEPENDENT_AMBULATORY_CARE_PROVIDER_SITE_OTHER): Payer: Medicare Other | Admitting: Family Medicine

## 2016-04-12 VITALS — BP 110/66 | HR 57 | Temp 97.7°F | Ht 62.0 in | Wt 212.9 lb

## 2016-04-12 DIAGNOSIS — M25552 Pain in left hip: Secondary | ICD-10-CM | POA: Diagnosis not present

## 2016-04-12 MED ORDER — HYDROCODONE-ACETAMINOPHEN 5-325 MG PO TABS: 1.0000 | ORAL_TABLET | Freq: Four times a day (QID) | ORAL | Status: DC | PRN

## 2016-04-12 NOTE — Progress Notes (Signed)
Pre visit review using our clinic review tool, if applicable. No additional management support is needed unless otherwise documented below in the visit note. 

## 2016-04-12 NOTE — Progress Notes (Addendum)
Subjective:     Patient ID: Angel Meyer, female   DOB: 1932-12-01, 82 y.o.   MRN: [id number]  HPI Left hip pain. She's had some intermittent pain in the past. Current pain started about 2 weeks ago. No recent fall. She has pain that radiates medially toward the groin region. Denies low back pain. She's using Tylenol but still has poor pain control at times. Had leftover hydrocodone which she took one of earlier today and helped. No fevers or chills  Past Medical History  Diagnosis Date  . Arthritis   . Persistent atrial fibrillation (HCC)     failed on Flecainide and Tikosyn  . Renal calculi   . Hypertension   . Hyperlipidemia   . Rheumatic fever     age 30  . History of blood transfusion   . Incontinence of urine   . Diastolic dysfunction     preserved EF,  CHF In setting of afib previously  . Osteoporosis   . OSA (obstructive sleep apnea)     uses CPAP  . Restless legs   . Systolic heart failure (HCC)     EF 30 to 35% per echo 08/2013  . Dysrhythmia     afib   Past Surgical History  Procedure Laterality Date  . Breast surgery      benign biopsy  . Tonsillectomy    . Abdominal hysterectomy    . Spine surgery      laminectomy 1965, spinal fusion with rod 2005  . Replacement total knee  2005  . Kidney stone surgery      1997 removed  . Cardioversion N/A 09/10/2013    Procedure: CARDIOVERSION;  Surgeon: Thompson Grayer, MD;  Location: [place];  Service: Cardiovascular;  Laterality: N/A;  . Cardioversion N/A 11/16/2013    Procedure: CARDIOVERSION;  Surgeon: Sueanne Margarita, MD;  Location[facility] ENDOSCOPY;  Service: Cardiovascular;  Laterality: N/A;  . Cardioversion N/A 08/18/2015    Procedure: CARDIOVERSION;  Surgeon: Sueanne Margarita, MD;  Location: [facility] ENDOSCOPY;  Service: Cardiovascular;  Laterality: N/A;    reports that she has never smoked. She does not have any smokeless tobacco history on file. She reports that she drinks alcohol. She reports that she does not use illicit  drugs. family history includes Cancer in her mother and paternal grandfather; Diabetes in her mother. Allergies  Allergen Reactions  . Ciprofloxacin Nausea And Vomiting  . Demerol [Meperidine] Nausea And Vomiting  . Sulfa Antibiotics Nausea And Vomiting     Review of Systems  Constitutional: Negative for fever and chills.  Musculoskeletal: Positive for arthralgias.  Skin: Negative for rash.  Neurological: Negative for weakness.  Hematological: Negative for adenopathy.       Objective:   Physical Exam  Constitutional: She appears well-developed and well-nourished.  Cardiovascular: Normal rate.   Pulmonary/Chest: Effort normal and breath sounds normal. No respiratory distress. She has no wheezes. She has no rales.  Musculoskeletal:  Left hip restricted range of motion. She has some mild bilateral leg edema which is not asymmetric. No lateral hip tenderness Left foot warm with good distal pulses.       Assessment:     Left hip pain. Suspect osteoarthritis.    Plan:     - Obtain diagnostic x-rays left hip - continue Tylenol as needed - refill hydrocodone 5 mg but do not take concomitant with regular Tylenol  # 30 tablets with no refill and take only for severe pain  [name] MD [facility] [place] show moderate degenerative arthritis.  Pt aware. Will set up ortho referral.  [name] MD [facility]

## 2016-04-13 ENCOUNTER — Other Ambulatory Visit: Payer: Self-pay | Admitting: Family Medicine

## 2016-04-13 ENCOUNTER — Ambulatory Visit (INDEPENDENT_AMBULATORY_CARE_PROVIDER_SITE_OTHER)
Admission: RE | Admit: 2016-04-13 | Discharge: 2016-04-13 | Disposition: A | Discharge status: Home / Self Care | Payer: Medicare Other | Source: Ambulatory Visit | Attending: Family Medicine | Admitting: Family Medicine

## 2016-04-13 DIAGNOSIS — M25552 Pain in left hip: Secondary | ICD-10-CM | POA: Diagnosis not present

## 2016-04-13 DIAGNOSIS — M1612 Unilateral primary osteoarthritis, left hip: Secondary | ICD-10-CM | POA: Diagnosis not present

## 2016-04-13 NOTE — Addendum Note (Signed)
Addended by: [name] on: 04/13/2016 09:54 PM   Modules accepted: Orders

## 2016-04-26 ENCOUNTER — Ambulatory Visit: Payer: Medicare Other

## 2016-05-03 ENCOUNTER — Ambulatory Visit (INDEPENDENT_AMBULATORY_CARE_PROVIDER_SITE_OTHER): Payer: Medicare Other | Admitting: General Practice

## 2016-05-03 DIAGNOSIS — I4891 Unspecified atrial fibrillation: Secondary | ICD-10-CM | POA: Diagnosis not present

## 2016-05-03 DIAGNOSIS — M25552 Pain in left hip: Secondary | ICD-10-CM | POA: Diagnosis not present

## 2016-05-03 DIAGNOSIS — Z5181 Encounter for therapeutic drug level monitoring: Secondary | ICD-10-CM

## 2016-05-03 LAB — POCT INR: INR: 4.2

## 2016-05-19 ENCOUNTER — Ambulatory Visit (INDEPENDENT_AMBULATORY_CARE_PROVIDER_SITE_OTHER): Payer: Medicare Other | Admitting: General Practice

## 2016-05-19 DIAGNOSIS — Z5181 Encounter for therapeutic drug level monitoring: Secondary | ICD-10-CM | POA: Diagnosis not present

## 2016-05-19 DIAGNOSIS — I4891 Unspecified atrial fibrillation: Secondary | ICD-10-CM

## 2016-05-19 LAB — POCT INR: INR: 2.2

## 2016-05-20 DIAGNOSIS — M1612 Unilateral primary osteoarthritis, left hip: Secondary | ICD-10-CM | POA: Diagnosis not present

## 2016-06-07 ENCOUNTER — Ambulatory Visit (INDEPENDENT_AMBULATORY_CARE_PROVIDER_SITE_OTHER): Payer: Medicare Other | Admitting: Internal Medicine

## 2016-06-07 ENCOUNTER — Encounter: Payer: Self-pay | Admitting: Internal Medicine

## 2016-06-07 VITALS — BP 114/70 | HR 64 | Ht 61.0 in | Wt 206.4 lb

## 2016-06-07 DIAGNOSIS — Z7901 Long term (current) use of anticoagulants: Secondary | ICD-10-CM | POA: Diagnosis not present

## 2016-06-07 DIAGNOSIS — I4819 Other persistent atrial fibrillation: Secondary | ICD-10-CM

## 2016-06-07 DIAGNOSIS — I481 Persistent atrial fibrillation: Secondary | ICD-10-CM | POA: Diagnosis not present

## 2016-06-07 DIAGNOSIS — I5032 Chronic diastolic (congestive) heart failure: Secondary | ICD-10-CM | POA: Diagnosis not present

## 2016-06-07 DIAGNOSIS — I10 Essential (primary) hypertension: Secondary | ICD-10-CM | POA: Diagnosis not present

## 2016-06-07 DIAGNOSIS — G4733 Obstructive sleep apnea (adult) (pediatric): Secondary | ICD-10-CM

## 2016-06-07 LAB — CBC WITH DIFFERENTIAL/PLATELET
BASOS ABS: 58 {cells}/uL (ref 0–200)
Basophils Relative: 1 %
Eosinophils Absolute: 116 cells/uL (ref 15–500)
Eosinophils Relative: 2 %
HEMATOCRIT: 46.2 % — AB (ref 35.0–45.0)
HEMOGLOBIN: 15.2 g/dL (ref 11.7–15.5)
LYMPHS PCT: 22 %
Lymphs Abs: 1276 cells/uL (ref 850–3900)
MCH: 32.5 pg (ref 27.0–33.0)
MCHC: 32.9 g/dL (ref 32.0–36.0)
MCV: 98.9 fL (ref 80.0–100.0)
MONO ABS: 696 {cells}/uL (ref 200–950)
MPV: 11.5 fL (ref 7.5–12.5)
Monocytes Relative: 12 %
NEUTROS PCT: 63 %
Neutro Abs: 3654 cells/uL (ref 1500–7800)
Platelets: 149 10*3/uL (ref 140–400)
RBC: 4.67 MIL/uL (ref 3.80–5.10)
RDW: 14.1 % (ref 11.0–15.0)
WBC: 5.8 10*3/uL (ref 3.8–10.8)

## 2016-06-07 LAB — HEPATIC FUNCTION PANEL
ALBUMIN: 4.1 g/dL (ref 3.6–5.1)
ALK PHOS: 59 U/L (ref 33–130)
ALT: 21 U/L (ref 6–29)
AST: 24 U/L (ref 10–35)
BILIRUBIN INDIRECT: 1 mg/dL (ref 0.2–1.2)
BILIRUBIN TOTAL: 1.3 mg/dL — AB (ref 0.2–1.2)
Bilirubin, Direct: 0.3 mg/dL — ABNORMAL HIGH (ref <=0.2)
TOTAL PROTEIN: 6.2 g/dL (ref 6.1–8.1)

## 2016-06-07 LAB — BASIC METABOLIC PANEL
BUN: 18 mg/dL (ref 7–25)
CO2: 25 mmol/L (ref 20–31)
Calcium: 9.4 mg/dL (ref 8.6–10.4)
Chloride: 106 mmol/L (ref 98–110)
Creat: 1.03 mg/dL — ABNORMAL HIGH (ref 0.60–0.88)
GLUCOSE: 85 mg/dL (ref 65–99)
POTASSIUM: 3.7 mmol/L (ref 3.5–5.3)
Sodium: 142 mmol/L (ref 135–146)

## 2016-06-07 LAB — T4, FREE: Free T4: 1.6 ng/dL (ref 0.8–1.8)

## 2016-06-07 LAB — TSH: TSH: 1.93 mIU/L

## 2016-06-07 MED ORDER — AMIODARONE HCL 200 MG PO TABS: 100.0000 mg | ORAL_TABLET | Freq: Every day | ORAL | 3 refills | Status: DC

## 2016-06-07 MED ORDER — DILTIAZEM HCL 30 MG PO TABS: 30.0000 mg | ORAL_TABLET | Freq: Four times a day (QID) | ORAL | 3 refills | Status: DC | PRN

## 2016-06-07 MED ORDER — ROSUVASTATIN CALCIUM 40 MG PO TABS: 40.0000 mg | ORAL_TABLET | Freq: Every day | ORAL | 3 refills | Status: DC

## 2016-06-07 NOTE — Progress Notes (Signed)
Electrophysiology Office Note   Date:  06/07/2016   ID:  Angel Meyer, DOB [birth year], MRN [id number]  PCP:  [name], MD  Primary Electrophysiologist: Thompson Grayer, MD    Chief Complaint  Patient presents with  . Atrial Fibrillation     History of Present Illness: Angel Meyer is a 82 y.o. female who presents today for electrophysiology evaluation.   She has persistent atrial fibrillation.  She previously failed medical therapy with flecainide and tikosyn.  She has more recently been treated with amiodarone and has done well.  She is not very active and has some difficulty with cognative impairment.  She is accompanied by her daughter today.    Today, she denies symptoms of palpitations, chest pain, shortness of breath, orthopnea, PND, lower extremity edema, claudication, dizziness, presyncope, syncope, bleeding, or neurologic sequela. The patient is tolerating medications without difficulties and is otherwise without complaint today.    Past Medical History:  Diagnosis Date  . Arthritis   . Diastolic dysfunction    preserved EF,  CHF In setting of afib previously  . Dysrhythmia    afib  . History of blood transfusion   . Hyperlipidemia   . Hypertension   . Incontinence of urine   . OSA (obstructive sleep apnea)    uses CPAP  . Osteoporosis   . Persistent atrial fibrillation (HCC)    failed on Flecainide and Tikosyn  . Renal calculi   . Restless legs   . Rheumatic fever    age 61  . Systolic heart failure (HCC)    EF 30 to 35% per echo 08/2013   Past Surgical History:  Procedure Laterality Date  . ABDOMINAL HYSTERECTOMY    . BREAST SURGERY     benign biopsy  . CARDIOVERSION N/A 09/10/2013   Procedure: CARDIOVERSION;  Surgeon: Thompson Grayer, MD;  Location: [place];  Service: Cardiovascular;  Laterality: N/A;  . CARDIOVERSION N/A 11/16/2013   Procedure: CARDIOVERSION;  Surgeon: Sueanne Margarita, MD;  Location: [facility] ENDOSCOPY;  Service: Cardiovascular;  Laterality:  N/A;  . CARDIOVERSION N/A 08/18/2015   Procedure: CARDIOVERSION;  Surgeon: Sueanne Margarita, MD;  Location: [place] ENDOSCOPY;  Service: Cardiovascular;  Laterality: N/A;  . [place] removed  . REPLACEMENT TOTAL KNEE  2005  . SPINE SURGERY     laminectomy 1965, spinal fusion with rod 2005  . TONSILLECTOMY       Current Outpatient Prescriptions  Medication Sig Dispense Refill  . amiodarone (PACERONE) 200 MG tablet Take 1 tablet (200 mg total) by mouth daily. 90 tablet 3  . clobetasol cream (TEMOVATE) [id number] % Apply 1 application topically 2 (two) times daily. For lichens simplex chronicus    . diltiazem (CARDIZEM) 30 MG tablet Take 1 tablet (30 mg total) by mouth every 6 (six) hours as needed (AFIB w/pulse >100 bpm until resolved). 30 tablet 3  . donepezil (ARICEPT ODT) 10 MG disintegrating tablet Take 1 tablet (10 mg total) by mouth at bedtime. 90 tablet 3  . escitalopram (LEXAPRO) 10 MG tablet Take 1 tablet (10 mg total) by mouth daily. 90 tablet 3  . fluocinonide ointment (LIDEX) 0.05 % APPLY ON THE SKIN BID for chronic rash  3  . furosemide (LASIX) 40 MG tablet Take 40 mg by mouth daily.    Marland Kitchen HYDROcodone-acetaminophen (NORCO/VICODIN) 5-325 MG tablet Take 1 tablet by mouth every 6 (six) hours as needed for moderate pain. 30 tablet 0  . Melatonin 5 MG TABS  Take 10 mg by mouth at bedtime.     . memantine (NAMENDA) 10 MG tablet Take 1 tablet (10 mg total) by mouth 2 (two) times daily. 180 tablet 3  . Misc. Devices (ROLLER [place]) MISC Used as Directed. 1 each 0  . Multiple Vitamins-Minerals (PRESERVISION AREDS 2) CAPS Take 1 capsule by mouth 2 (two) times daily.    . nitroGLYCERIN (NITROSTAT) 0.4 MG SL tablet Place 1 tablet (0.4 mg total) under the tongue every 5 (five) minutes as needed for chest pain. 25 tablet 3  . oxybutynin (DITROPAN-XL) 10 MG 24 hr tablet Take 10 mg by mouth daily.   11  . potassium chloride SA (K-DUR,KLOR-CON) 20 MEQ tablet Take 1 tablet (20 mEq total)  by mouth daily. 90 tablet 3  . rOPINIRole (REQUIP) 1 MG tablet Take 1 tablet (1 mg total) by mouth See admin instructions. Take 1 tablet (1 mg) by mouth daily at 4pm and at bedtime 180 tablet 3  . rosuvastatin (CRESTOR) 40 MG tablet Take 40 mg by mouth at bedtime.    Marland Kitchen spironolactone (ALDACTONE) 25 MG tablet Take 1 tablet (25 mg total) by mouth daily. 90 tablet 3  . trimethoprim (TRIMPEX) 100 MG tablet Take 100 mg by mouth daily.   11  . warfarin (COUMADIN) 2.5 MG tablet Take as directed by anticoagulation clinic. 105 tablet 1   No current facility-administered medications for this visit.     Allergies:   Ciprofloxacin; Demerol [meperidine]; Sulfa antibiotics; and Sulfacetamide sodium   Social History:  The patient  reports that she has never smoked. She does not have any smokeless tobacco history on file. She reports that she drinks alcohol. She reports that she does not use drugs.   Family History:  The patient's  family history includes Cancer in her mother and paternal grandfather; Diabetes in her mother.    ROS:  Please see the history of present illness.   All other systems are reviewed and negative.    PHYSICAL EXAM: VS:  BP 114/70   Pulse 64   Ht 5\' 1"  (1.549 m)   Wt 206 lb 6.4 oz (93.6 kg)   BMI 39.00 kg/m  , BMI Body mass index is 39 kg/m. GEN: Well nourished, well developed, in no acute distress  HEENT: normal  Neck: no JVD, carotid bruits, or masses Cardiac: RRR; no murmurs, rubs, or gallops,no edema  Respiratory:  clear to auscultation bilaterally, normal work of breathing GI: soft, nontender, nondistended, + BS MS: no deformity or atrophy  Skin: warm and dry  Neuro:  Strength and sensation are intact Psych: euthymic mood, full affect  EKG:  EKG is ordered today. The ekg ordered today shows sinus rhythm, poor R wave progression  Recent Labs: 12/10/2015: ALT 24; Hemoglobin 15.5; Platelets 158; TSH 3.27 12/17/2015: BUN 22; Creatinine, Ser 1.10; Potassium 4.1;  Sodium 141    Lipid Panel     Component Value Date/Time   CHOL 201 (H) 08/13/2015 1219   TRIG 133 08/13/2015 1219   HDL 75 08/13/2015 1219   CHOLHDL 2.7 08/13/2015 1219   VLDL 27 08/13/2015 1219   LDLCALC 99 08/13/2015 1219    Wt Readings from Last 3 Encounters:  06/07/16 206 lb 6.4 oz (93.6 kg)  04/12/16 212 lb 14.4 oz (96.6 kg)  12/17/15 208 lb 14.4 oz (94.8 kg)     ASSESSMENT AND PLAN:  1.  Persistent afib Maintaining sinus with amiodarone. Continue long term anticoagulation Poor candidate for ablation given advanced age,sedentary  lifestyle, and conative changes.  Given prior rheumatic fever, anticipated success with ablation is reduced. Daughter agrees with a conservative approach.  We discussed risks of amiodarone today.  They prefer to continue this medicines.  We will try to reduce dose to 100mg  daily at this time.  Check lfts, tfts today  2. OSA Compliance with CPAP encouraged.  She has not been using CPAP for quite some time.  3. HTN Stable No change required today bmet today  4. Obesity Body mass index is 39 kg/m. She is not interested in lifestyle modification   Follow-up with Brynda Rim in 6 months  Current medicines are reviewed at length with the patient today.   The patient does not have concerns regarding her medicines.  The following changes were made today:  none   Signed, Thompson Grayer, MD  06/07/2016 9:54 AM     [facility] [address] [place] [address] [phone] (office) [phone] (fax)

## 2016-06-07 NOTE — Patient Instructions (Signed)
Medication Instructions:  Your physician has recommended you make the following change in your medication:  1) Reduce Amiodarone to 100 mg (1/2 tablet) by mouth daily  Labwork: Your physician recommends that you return for lab work today: Liver, TSH, T4, BMET, CBC   Testing/Procedures: None ordered    Follow-Up: Your physician wants you to follow-up in: 6 months with Truitt Merle, NP. You will receive a reminder letter in the mail two months in advance. If you don't receive a letter, please call our office to schedule the follow-up appointment.   Any Other Special Instructions Will Be Listed Below (If Applicable).     If you need a refill on your cardiac medications before your next appointment, please call your pharmacy.

## 2016-06-09 ENCOUNTER — Ambulatory Visit (INDEPENDENT_AMBULATORY_CARE_PROVIDER_SITE_OTHER): Payer: Medicare Other | Admitting: General Practice

## 2016-06-09 ENCOUNTER — Other Ambulatory Visit: Payer: Self-pay

## 2016-06-09 ENCOUNTER — Other Ambulatory Visit (INDEPENDENT_AMBULATORY_CARE_PROVIDER_SITE_OTHER): Payer: Medicare Other

## 2016-06-09 ENCOUNTER — Telehealth: Payer: Self-pay | Admitting: Family Medicine

## 2016-06-09 DIAGNOSIS — Z5181 Encounter for therapeutic drug level monitoring: Secondary | ICD-10-CM | POA: Diagnosis not present

## 2016-06-09 DIAGNOSIS — R350 Frequency of micturition: Secondary | ICD-10-CM | POA: Diagnosis not present

## 2016-06-09 DIAGNOSIS — I4891 Unspecified atrial fibrillation: Secondary | ICD-10-CM | POA: Diagnosis not present

## 2016-06-09 LAB — POCT URINALYSIS DIPSTICK
BILIRUBIN UA: NEGATIVE
Blood, UA: NEGATIVE
Glucose, UA: NEGATIVE
KETONES UA: NEGATIVE
LEUKOCYTES UA: NEGATIVE
Nitrite, UA: POSITIVE
Urobilinogen, UA: 1
pH, UA: 5.5

## 2016-06-09 LAB — POCT INR: INR: 1.5

## 2016-06-09 MED ORDER — CEPHALEXIN 500 MG PO CAPS: 500.0000 mg | ORAL_CAPSULE | Freq: Three times a day (TID) | ORAL | 0 refills | Status: DC

## 2016-06-09 NOTE — Telephone Encounter (Signed)
Dr. Elease Hashimoto gave a verbal for order. I have entered this with DX: urinary frequency

## 2016-06-09 NOTE — Telephone Encounter (Signed)
Pts daughter state that she thinks her mother is coming down with bladder infection and would like to see if she can get a urinalysis done when they come in for coumadin check at 1:45.

## 2016-06-09 NOTE — Telephone Encounter (Signed)
Last seen on 04/12/2016. Okay for lab order?

## 2016-06-15 ENCOUNTER — Telehealth: Payer: Self-pay | Admitting: Family Medicine

## 2016-06-15 MED ORDER — CEPHALEXIN 500 MG PO CAPS: 500.0000 mg | ORAL_CAPSULE | Freq: Three times a day (TID) | ORAL | 0 refills | Status: DC

## 2016-06-15 NOTE — Telephone Encounter (Signed)
° °  Pt daughter [name] call and would like a call back concerning culture pt had last week

## 2016-06-15 NOTE — Telephone Encounter (Signed)
Spoke with Daughter--culture has did not get done. Pt never started antibiotic so I resent this to pharmacy. If pt is still having sx she we will recollect.

## 2016-06-18 ENCOUNTER — Ambulatory Visit: Payer: Medicare Other | Admitting: Family Medicine

## 2016-06-23 ENCOUNTER — Ambulatory Visit (INDEPENDENT_AMBULATORY_CARE_PROVIDER_SITE_OTHER): Payer: Medicare Other | Admitting: General Practice

## 2016-06-23 DIAGNOSIS — H524 Presbyopia: Secondary | ICD-10-CM | POA: Diagnosis not present

## 2016-06-23 DIAGNOSIS — H25812 Combined forms of age-related cataract, left eye: Secondary | ICD-10-CM | POA: Diagnosis not present

## 2016-06-23 DIAGNOSIS — Z5181 Encounter for therapeutic drug level monitoring: Secondary | ICD-10-CM

## 2016-06-23 DIAGNOSIS — H35363 Drusen (degenerative) of macula, bilateral: Secondary | ICD-10-CM | POA: Diagnosis not present

## 2016-06-23 DIAGNOSIS — I4891 Unspecified atrial fibrillation: Secondary | ICD-10-CM | POA: Diagnosis not present

## 2016-06-23 LAB — POCT INR: INR: 1.3

## 2016-06-29 ENCOUNTER — Ambulatory Visit (INDEPENDENT_AMBULATORY_CARE_PROVIDER_SITE_OTHER): Payer: Medicare Other | Admitting: Family Medicine

## 2016-06-29 VITALS — BP 120/80 | HR 78 | Temp 97.7°F | Ht 61.0 in | Wt 203.1 lb

## 2016-06-29 DIAGNOSIS — R300 Dysuria: Secondary | ICD-10-CM

## 2016-06-29 DIAGNOSIS — R4189 Other symptoms and signs involving cognitive functions and awareness: Secondary | ICD-10-CM

## 2016-06-29 DIAGNOSIS — B372 Candidiasis of skin and nail: Secondary | ICD-10-CM | POA: Diagnosis not present

## 2016-06-29 DIAGNOSIS — Z23 Encounter for immunization: Secondary | ICD-10-CM

## 2016-06-29 LAB — POCT URINALYSIS DIPSTICK
Bilirubin, UA: NEGATIVE
Glucose, UA: NEGATIVE
Ketones, UA: NEGATIVE
Leukocytes, UA: NEGATIVE
NITRITE UA: NEGATIVE
RBC UA: NEGATIVE
Spec Grav, UA: 1.015
UROBILINOGEN UA: 0.2
pH, UA: 5

## 2016-06-29 MED ORDER — ROPINIROLE HCL 1 MG PO TABS: 1.0000 mg | ORAL_TABLET | ORAL | 2 refills | Status: AC

## 2016-06-29 MED ORDER — POTASSIUM CHLORIDE CRYS ER 20 MEQ PO TBCR: 20.0000 meq | EXTENDED_RELEASE_TABLET | Freq: Every day | ORAL | 2 refills | Status: DC

## 2016-06-29 MED ORDER — DONEPEZIL HCL 10 MG PO TBDP: 10.0000 mg | ORAL_TABLET | Freq: Every day | ORAL | 2 refills | Status: DC

## 2016-06-29 MED ORDER — AMIODARONE HCL 200 MG PO TABS: 100.0000 mg | ORAL_TABLET | Freq: Every day | ORAL | 2 refills | Status: DC

## 2016-06-29 MED ORDER — MEMANTINE HCL 10 MG PO TABS: 10.0000 mg | ORAL_TABLET | Freq: Two times a day (BID) | ORAL | 2 refills | Status: DC

## 2016-06-29 NOTE — Progress Notes (Signed)
Subjective:     Patient ID: Angel Meyer, female   DOB: Nov 29, 1932, 82 y.o.   MRN: [id number]  HPI Patient seen for follow-up regarding several items as below. She has multiple chronic problems including history of obesity, atrial fibrillation, urinary urgency, stress urinary incontinence, hypertension, congestive heart failure, obstructive sleep apnea, osteoarthritis, cognitive impairment with probable early Alzheimer's dementia, and history of kidney stones. She lives with her daughter and son-in-law. Recent left hip pain improved following steroid injection. Ambulates with a walker without difficulty  Recent urinary infection. Symptomatically improved with Keflex. She is requesting urinalysis be repeated today. No burning with urination  Skin rash under both breast but mostly right breast. Pruritic and slightly erythematous. She tried steroid cream previously prescribed without much benefit  Cognitive impairment. Previous thyroid and B12 testing normal. Takes Namenda and Aricept. Family does think this has slowed her decline. Not much change from last visit. Last MMSE 28/36 months ago  Past Medical History:  Diagnosis Date  . Arthritis   . Diastolic dysfunction    preserved EF,  CHF In setting of afib previously  . Dysrhythmia    afib  . History of blood transfusion   . Hyperlipidemia   . Hypertension   . Incontinence of urine   . OSA (obstructive sleep apnea)    uses CPAP  . Osteoporosis   . Persistent atrial fibrillation (HCC)    failed on Flecainide and Tikosyn  . Renal calculi   . Restless legs   . Rheumatic fever    age 36  . Systolic heart failure (HCC)    EF 30 to 35% per echo 08/2013   Past Surgical History:  Procedure Laterality Date  . ABDOMINAL HYSTERECTOMY    . BREAST SURGERY     benign biopsy  . CARDIOVERSION N/A 09/10/2013   Procedure: CARDIOVERSION;  Surgeon: Thompson Grayer, MD;  Location: [place];  Service: Cardiovascular;  Laterality: N/A;  . CARDIOVERSION N/A  11/16/2013   Procedure: CARDIOVERSION;  Surgeon: Sueanne Margarita, MD;  Location: [facility] ENDOSCOPY;  Service: Cardiovascular;  Laterality: N/A;  . CARDIOVERSION N/A 08/18/2015   Procedure: CARDIOVERSION;  Surgeon: Sueanne Margarita, MD;  Location: [place] ENDOSCOPY;  Service: Cardiovascular;  Laterality: N/A;  . [place] removed  . REPLACEMENT TOTAL KNEE  2005  . SPINE SURGERY     laminectomy 1965, spinal fusion with rod 2005  . TONSILLECTOMY      reports that she has never smoked. She does not have any smokeless tobacco history on file. She reports that she drinks alcohol. She reports that she does not use drugs. family history includes Cancer in her mother and paternal grandfather; Diabetes in her mother. Allergies  Allergen Reactions  . Ciprofloxacin Nausea And Vomiting  . Demerol [Meperidine] Nausea And Vomiting  . Sulfa Antibiotics Nausea And Vomiting    And all derivatives -  GI Bleeding  . Sulfacetamide Sodium Nausea And Vomiting     Review of Systems  Constitutional: Positive for fatigue.  Eyes: Negative for visual disturbance.  Respiratory: Negative for cough, chest tightness, shortness of breath and wheezing.   Cardiovascular: Negative for chest pain, palpitations and leg swelling.  Gastrointestinal: Negative for abdominal pain.  Genitourinary: Negative for dysuria.  Neurological: Negative for dizziness, seizures, syncope, weakness, light-headedness and headaches.  Psychiatric/Behavioral: Negative for agitation and dysphoric mood.       Objective:   Physical Exam  Constitutional: She is oriented to person, place, and time. She  appears well-developed and well-nourished. No distress.  Neck: Neck supple. No thyromegaly present.  Cardiovascular: Normal rate.   Pulmonary/Chest: Effort normal and breath sounds normal. No respiratory distress. She has no wheezes. She has no rales.  Musculoskeletal: She exhibits no edema.  Neurological: She is alert and oriented to  person, place, and time. No cranial nerve deficit.  Skin: Rash noted.   mild nonspecific erythema under the right breast greater than left. No pustules.  Psychiatric:  MMSE 27/30       Assessment:     #1 cognitive impairment with probable early Alzheimer's dementia. Only minimal change since last visit  #2 recent urinary tract infection. Symptoms resolved following antibiotics  #3 probable Candida skin rash under both breasts    Plan:     -Try over-the-counter Lotrimin cream twice daily and touch base if not resolving over the next week or 2 -Repeat urine dipstick today is normal -Repeat MMSE today 27/30. Continue Namenda and Aricept -High-dose flu vaccine given -Routine follow-up 6 months -Continue close follow-up with Coumadin clinic regarding her atrial fibrillation  [name] MD [facility] at [facility]

## 2016-06-29 NOTE — Progress Notes (Signed)
Pre visit review using our clinic review tool, if applicable. No additional management support is needed unless otherwise documented below in the visit note. 

## 2016-07-07 ENCOUNTER — Ambulatory Visit (INDEPENDENT_AMBULATORY_CARE_PROVIDER_SITE_OTHER): Payer: Medicare Other | Admitting: General Practice

## 2016-07-07 DIAGNOSIS — Z5181 Encounter for therapeutic drug level monitoring: Secondary | ICD-10-CM

## 2016-07-07 DIAGNOSIS — I4891 Unspecified atrial fibrillation: Secondary | ICD-10-CM

## 2016-07-07 LAB — POCT INR: INR: 2.1

## 2016-07-13 DIAGNOSIS — Z9181 History of falling: Secondary | ICD-10-CM | POA: Diagnosis not present

## 2016-07-13 DIAGNOSIS — R2689 Other abnormalities of gait and mobility: Secondary | ICD-10-CM | POA: Diagnosis not present

## 2016-07-19 DIAGNOSIS — Z9181 History of falling: Secondary | ICD-10-CM | POA: Diagnosis not present

## 2016-07-19 DIAGNOSIS — R2689 Other abnormalities of gait and mobility: Secondary | ICD-10-CM | POA: Diagnosis not present

## 2016-07-21 ENCOUNTER — Ambulatory Visit (INDEPENDENT_AMBULATORY_CARE_PROVIDER_SITE_OTHER): Payer: Medicare Other | Admitting: General Practice

## 2016-07-21 DIAGNOSIS — I4891 Unspecified atrial fibrillation: Secondary | ICD-10-CM

## 2016-07-21 DIAGNOSIS — Z5181 Encounter for therapeutic drug level monitoring: Secondary | ICD-10-CM | POA: Diagnosis not present

## 2016-07-21 LAB — POCT INR: INR: 3.5

## 2016-07-21 NOTE — Patient Instructions (Signed)
Pre visit review using our clinic review tool, if applicable. No additional management support is needed unless otherwise documented below in the visit note. 

## 2016-07-22 ENCOUNTER — Telehealth: Payer: Self-pay

## 2016-07-22 DIAGNOSIS — Z9181 History of falling: Secondary | ICD-10-CM | POA: Diagnosis not present

## 2016-07-22 DIAGNOSIS — R2689 Other abnormalities of gait and mobility: Secondary | ICD-10-CM | POA: Diagnosis not present

## 2016-07-26 DIAGNOSIS — R2689 Other abnormalities of gait and mobility: Secondary | ICD-10-CM | POA: Diagnosis not present

## 2016-07-26 DIAGNOSIS — Z9181 History of falling: Secondary | ICD-10-CM | POA: Diagnosis not present

## 2016-07-30 DIAGNOSIS — R2689 Other abnormalities of gait and mobility: Secondary | ICD-10-CM | POA: Diagnosis not present

## 2016-07-30 DIAGNOSIS — Z9181 History of falling: Secondary | ICD-10-CM | POA: Diagnosis not present

## 2016-08-02 DIAGNOSIS — R2689 Other abnormalities of gait and mobility: Secondary | ICD-10-CM | POA: Diagnosis not present

## 2016-08-02 DIAGNOSIS — Z9181 History of falling: Secondary | ICD-10-CM | POA: Diagnosis not present

## 2016-08-06 NOTE — Telephone Encounter (Signed)
Telephone note opened in error

## 2016-08-09 DIAGNOSIS — R2689 Other abnormalities of gait and mobility: Secondary | ICD-10-CM | POA: Diagnosis not present

## 2016-08-09 DIAGNOSIS — Z9181 History of falling: Secondary | ICD-10-CM | POA: Diagnosis not present

## 2016-08-12 DIAGNOSIS — Z9181 History of falling: Secondary | ICD-10-CM | POA: Diagnosis not present

## 2016-08-12 DIAGNOSIS — R2689 Other abnormalities of gait and mobility: Secondary | ICD-10-CM | POA: Diagnosis not present

## 2016-08-16 ENCOUNTER — Ambulatory Visit: Payer: Medicare Other

## 2016-08-16 DIAGNOSIS — R2689 Other abnormalities of gait and mobility: Secondary | ICD-10-CM | POA: Diagnosis not present

## 2016-08-16 DIAGNOSIS — Z9181 History of falling: Secondary | ICD-10-CM | POA: Diagnosis not present

## 2016-08-18 ENCOUNTER — Ambulatory Visit (INDEPENDENT_AMBULATORY_CARE_PROVIDER_SITE_OTHER): Payer: Medicare Other | Admitting: General Practice

## 2016-08-18 DIAGNOSIS — I4891 Unspecified atrial fibrillation: Secondary | ICD-10-CM

## 2016-08-18 DIAGNOSIS — Z5181 Encounter for therapeutic drug level monitoring: Secondary | ICD-10-CM | POA: Diagnosis not present

## 2016-08-18 LAB — POCT INR: INR: 3.1

## 2016-08-23 DIAGNOSIS — R2689 Other abnormalities of gait and mobility: Secondary | ICD-10-CM | POA: Diagnosis not present

## 2016-08-23 DIAGNOSIS — Z9181 History of falling: Secondary | ICD-10-CM | POA: Diagnosis not present

## 2016-08-26 DIAGNOSIS — R2689 Other abnormalities of gait and mobility: Secondary | ICD-10-CM | POA: Diagnosis not present

## 2016-08-26 DIAGNOSIS — Z9181 History of falling: Secondary | ICD-10-CM | POA: Diagnosis not present

## 2016-08-30 DIAGNOSIS — R2689 Other abnormalities of gait and mobility: Secondary | ICD-10-CM | POA: Diagnosis not present

## 2016-08-30 DIAGNOSIS — Z9181 History of falling: Secondary | ICD-10-CM | POA: Diagnosis not present

## 2016-09-03 DIAGNOSIS — Z9181 History of falling: Secondary | ICD-10-CM | POA: Diagnosis not present

## 2016-09-03 DIAGNOSIS — R2689 Other abnormalities of gait and mobility: Secondary | ICD-10-CM | POA: Diagnosis not present

## 2016-09-06 DIAGNOSIS — R2689 Other abnormalities of gait and mobility: Secondary | ICD-10-CM | POA: Diagnosis not present

## 2016-09-06 DIAGNOSIS — Z9181 History of falling: Secondary | ICD-10-CM | POA: Diagnosis not present

## 2016-09-10 DIAGNOSIS — Z9181 History of falling: Secondary | ICD-10-CM | POA: Diagnosis not present

## 2016-09-10 DIAGNOSIS — R2689 Other abnormalities of gait and mobility: Secondary | ICD-10-CM | POA: Diagnosis not present

## 2016-09-13 DIAGNOSIS — R2689 Other abnormalities of gait and mobility: Secondary | ICD-10-CM | POA: Diagnosis not present

## 2016-09-13 DIAGNOSIS — Z9181 History of falling: Secondary | ICD-10-CM | POA: Diagnosis not present

## 2016-09-15 ENCOUNTER — Ambulatory Visit (INDEPENDENT_AMBULATORY_CARE_PROVIDER_SITE_OTHER): Payer: Medicare Other | Admitting: General Practice

## 2016-09-15 DIAGNOSIS — I4891 Unspecified atrial fibrillation: Secondary | ICD-10-CM

## 2016-09-15 DIAGNOSIS — Z5181 Encounter for therapeutic drug level monitoring: Secondary | ICD-10-CM

## 2016-09-15 LAB — POCT INR: INR: 1.6

## 2016-09-15 NOTE — Patient Instructions (Signed)
Pre visit review using our clinic review tool, if applicable. No additional management support is needed unless otherwise documented below in the visit note. 

## 2016-09-22 ENCOUNTER — Telehealth: Payer: Self-pay

## 2016-09-22 NOTE — Telephone Encounter (Signed)
Call to schedule AWV in the future; patient lives with dtr and son in law; low grade dementia Left direct line to call back

## 2016-09-26 DIAGNOSIS — R300 Dysuria: Secondary | ICD-10-CM | POA: Diagnosis not present

## 2016-09-26 DIAGNOSIS — N3000 Acute cystitis without hematuria: Secondary | ICD-10-CM | POA: Diagnosis not present

## 2016-09-29 DIAGNOSIS — R531 Weakness: Secondary | ICD-10-CM | POA: Diagnosis not present

## 2016-10-04 DIAGNOSIS — R2689 Other abnormalities of gait and mobility: Secondary | ICD-10-CM | POA: Diagnosis not present

## 2016-10-04 DIAGNOSIS — Z9181 History of falling: Secondary | ICD-10-CM | POA: Diagnosis not present

## 2016-10-13 ENCOUNTER — Ambulatory Visit: Payer: Medicare Other

## 2016-10-15 ENCOUNTER — Telehealth: Payer: Self-pay | Admitting: General Practice

## 2016-10-15 NOTE — Telephone Encounter (Signed)
Attempted to contact patient.  Line busy.  Will try again.

## 2016-10-20 ENCOUNTER — Ambulatory Visit (INDEPENDENT_AMBULATORY_CARE_PROVIDER_SITE_OTHER): Payer: Medicare Other | Admitting: General Practice

## 2016-10-20 DIAGNOSIS — Z5181 Encounter for therapeutic drug level monitoring: Secondary | ICD-10-CM | POA: Diagnosis not present

## 2016-10-20 DIAGNOSIS — I4891 Unspecified atrial fibrillation: Secondary | ICD-10-CM

## 2016-10-20 LAB — POCT INR: INR: 4.6

## 2016-10-20 NOTE — Patient Instructions (Signed)
Pre visit review using our clinic review tool, if applicable. No additional management support is needed unless otherwise documented below in the visit note. 

## 2016-10-25 DIAGNOSIS — R2689 Other abnormalities of gait and mobility: Secondary | ICD-10-CM | POA: Diagnosis not present

## 2016-10-25 DIAGNOSIS — Z9181 History of falling: Secondary | ICD-10-CM | POA: Diagnosis not present

## 2016-10-28 DIAGNOSIS — N3941 Urge incontinence: Secondary | ICD-10-CM | POA: Diagnosis not present

## 2016-10-28 DIAGNOSIS — N3020 Other chronic cystitis without hematuria: Secondary | ICD-10-CM | POA: Diagnosis not present

## 2016-10-29 DIAGNOSIS — R2689 Other abnormalities of gait and mobility: Secondary | ICD-10-CM | POA: Diagnosis not present

## 2016-10-29 DIAGNOSIS — Z9181 History of falling: Secondary | ICD-10-CM | POA: Diagnosis not present

## 2016-11-04 ENCOUNTER — Telehealth: Payer: Self-pay

## 2016-11-04 NOTE — Telephone Encounter (Signed)
Call to Ms Marsicano and dtr answered, Will be happy to bring her in for AWV  at 12:30 prior to her seeing Dr. Elease Hashimoto on 4/18 at 1: 68.

## 2016-11-10 ENCOUNTER — Ambulatory Visit (INDEPENDENT_AMBULATORY_CARE_PROVIDER_SITE_OTHER): Payer: Medicare Other | Admitting: General Practice

## 2016-11-10 DIAGNOSIS — I4891 Unspecified atrial fibrillation: Secondary | ICD-10-CM

## 2016-11-10 DIAGNOSIS — Z5181 Encounter for therapeutic drug level monitoring: Secondary | ICD-10-CM | POA: Diagnosis not present

## 2016-11-10 DIAGNOSIS — M1612 Unilateral primary osteoarthritis, left hip: Secondary | ICD-10-CM | POA: Diagnosis not present

## 2016-11-10 LAB — POCT INR: INR: 4.2

## 2016-11-10 NOTE — Patient Instructions (Signed)
Pre visit review using our clinic review tool, if applicable. No additional management support is needed unless otherwise documented below in the visit note. 

## 2016-11-18 DIAGNOSIS — Z9181 History of falling: Secondary | ICD-10-CM | POA: Diagnosis not present

## 2016-11-18 DIAGNOSIS — R2689 Other abnormalities of gait and mobility: Secondary | ICD-10-CM | POA: Diagnosis not present

## 2016-11-24 DIAGNOSIS — M25552 Pain in left hip: Secondary | ICD-10-CM | POA: Diagnosis not present

## 2016-11-29 DIAGNOSIS — L280 Lichen simplex chronicus: Secondary | ICD-10-CM | POA: Diagnosis not present

## 2016-11-29 DIAGNOSIS — L304 Erythema intertrigo: Secondary | ICD-10-CM | POA: Diagnosis not present

## 2016-12-06 NOTE — Progress Notes (Addendum)
CARDIOLOGY OFFICE NOTE  Date:  12/07/2016    Angel Meyer Date of Birth: [birth year] Medical Record #[id number]  PCP:  [name], MD  Cardiologist:  [place]    Chief Complaint  Patient presents with  . Atrial Fibrillation    6 month check - seen for Dr. Rayann Heman    History of Present Illness: Angel Meyer is a 83 y.o. female who presents today for a 6 month check. Seen for Dr. Rayann Heman.   She has PAF, rheumatic fever at age 78, HTN and OSA. She has a lung nodule that is followed by pulmonary which resolved. She failed on Flecainide and Tikosyn and is now on low dose amiodarone. Therapy has been limited by her HR. She is previously from [place] and has now moved here with her daughter. She has had some progressive memory issues. She remains on coumadin anticoagulation.   Saw me in November of 2016 - found to be back in AF - was symptomatic - sent her for a cardioversion and back to EP. Amiodarone increased short term.   Saw Dr. Rayann Heman in December of 2016 - poor candidate for ablation -and they have elected for conservative therapy. He last saw her in September and she was felt to be doing ok - he did cut her dose of amiodarone back. I last saw her a year ago - seemed to be in a general decline.   Comes back today. Here with her daughter. Appetite is less. She has lost a few pounds. Seems pretty uninterested in everything. Not very sedentary. No chest pain. No palpitations. Fatigues easily. No syncope. No falls. Medicines going ok. Overall, no real change in her condition.    Past Medical History:  Diagnosis Date  . Arthritis   . Diastolic dysfunction    preserved EF,  CHF In setting of afib previously  . Dysrhythmia    afib  . History of blood transfusion   . Hyperlipidemia   . Hypertension   . Incontinence of urine   . OSA (obstructive sleep apnea)    uses CPAP  . Osteoporosis   . Persistent atrial fibrillation (HCC)    failed on Flecainide and Tikosyn    . Renal calculi   . Restless legs   . Rheumatic fever    age 20  . Systolic heart failure (HCC)    EF 30 to 35% per echo 08/2013    Past Surgical History:  Procedure Laterality Date  . ABDOMINAL HYSTERECTOMY    . BREAST SURGERY     benign biopsy  . CARDIOVERSION N/A 09/10/2013   Procedure: CARDIOVERSION;  Surgeon: Thompson Grayer, MD;  Location: [facility];  Service: Cardiovascular;  Laterality: N/A;  . CARDIOVERSION N/A 11/16/2013   Procedure: CARDIOVERSION;  Surgeon: Sueanne Margarita, MD;  Location: [facility] ENDOSCOPY;  Service: Cardiovascular;  Laterality: N/A;  . CARDIOVERSION N/A 08/18/2015   Procedure: CARDIOVERSION;  Surgeon: Sueanne Margarita, MD;  Location: [facility] ENDOSCOPY;  Service: Cardiovascular;  Laterality: N/A;  . [place] removed  . REPLACEMENT TOTAL KNEE  2005  . SPINE SURGERY     laminectomy 1965, spinal fusion with rod 2005  . TONSILLECTOMY       Medications: Current Outpatient Prescriptions  Medication Sig Dispense Refill  . amiodarone (PACERONE) 200 MG tablet Take 0.5 tablets (100 mg total) by mouth daily. 45 tablet 3  . clobetasol cream (TEMOVATE) 8.54 % Apply 1 application topically 2 (two) times daily.  For lichens simplex chronicus    . diltiazem (CARDIZEM) 30 MG tablet Take 1 tablet (30 mg total) by mouth every 6 (six) hours as needed (AFIB w/pulse >100 bpm until resolved). 30 tablet 3  . donepezil (ARICEPT ODT) 10 MG disintegrating tablet Take 1 tablet (10 mg total) by mouth at bedtime. 90 tablet 2  . escitalopram (LEXAPRO) 10 MG tablet Take 1 tablet (10 mg total) by mouth daily. 90 tablet 3  . fluocinonide ointment (LIDEX) 0.05 % APPLY ON THE SKIN BID for chronic rash  3  . furosemide (LASIX) 40 MG tablet Take 1 tablet (40 mg total) by mouth daily. 90 tablet 3  . HYDROcodone-acetaminophen (NORCO/VICODIN) 5-325 MG tablet Take 1 tablet by mouth every 6 (six) hours as needed for moderate pain. 30 tablet 0  . Melatonin 5 MG TABS Take 10 mg by mouth at  bedtime.     . memantine (NAMENDA) 10 MG tablet Take 1 tablet (10 mg total) by mouth 2 (two) times daily. 180 tablet 2  . Misc. Devices (ROLLER [place]) MISC Used as Directed. 1 each 0  . Multiple Vitamins-Minerals (PRESERVISION AREDS 2) CAPS Take 1 capsule by mouth 2 (two) times daily.    . nitroGLYCERIN (NITROSTAT) 0.4 MG SL tablet Place 1 tablet (0.4 mg total) under the tongue every 5 (five) minutes as needed for chest pain. 25 tablet 3  . oxybutynin (DITROPAN-XL) 10 MG 24 hr tablet Take 10 mg by mouth daily.   11  . potassium chloride SA (K-DUR,KLOR-CON) 20 MEQ tablet Take 1 tablet (20 mEq total) by mouth daily. 90 tablet 3  . rOPINIRole (REQUIP) 1 MG tablet Take 1 tablet (1 mg total) by mouth See admin instructions. Take 1 tablet (1 mg) by mouth daily at 4pm and at bedtime 180 tablet 2  . rosuvastatin (CRESTOR) 40 MG tablet Take 1 tablet (40 mg total) by mouth at bedtime. 90 tablet 3  . spironolactone (ALDACTONE) 25 MG tablet Take 1 tablet (25 mg total) by mouth daily. 90 tablet 3  . trimethoprim (TRIMPEX) 100 MG tablet Take 100 mg by mouth daily.   11  . warfarin (COUMADIN) 2.5 MG tablet Take as directed by anticoagulation clinic. 105 tablet 1   No current facility-administered medications for this visit.     Allergies: Allergies  Allergen Reactions  . Ciprofloxacin Nausea And Vomiting  . Demerol [Meperidine] Nausea And Vomiting  . Sulfa Antibiotics Nausea And Vomiting    And all derivatives -  GI Bleeding  . Sulfacetamide Sodium Nausea And Vomiting    Social History: The patient  reports that she has never smoked. She has never used smokeless tobacco. She reports that she drinks alcohol. She reports that she does not use drugs.   Family History: The patient's family history includes Cancer in her mother and paternal grandfather; Diabetes in her mother.   Review of Systems: Please see the history of present illness.   Otherwise, the review of systems is positive for none.   All  other systems are reviewed and negative.   Physical Exam: VS:  BP 110/70   Pulse 68   Ht 5' (1.524 m)   Wt 201 lb (91.2 kg)   BMI 39.26 kg/m  .  BMI Body mass index is 39.26 kg/m.  Wt Readings from Last 3 Encounters:  12/07/16 201 lb (91.2 kg)  06/29/16 203 lb 1.6 oz (92.1 kg)  06/07/16 206 lb 6.4 oz (93.6 kg)    General: Pleasant. Obese. She is  alert and in no acute distress.  Her weight is down 5 pounds.  HEENT: Normal.  Neck: Supple, no JVD, carotid bruits, or masses noted.  Cardiac: Regular rate and rhythm. No murmurs, rubs, or gallops. No edema.  Respiratory:  Lungs are clear to auscultation bilaterally with normal work of breathing.  GI: Soft and nontender.  MS: No deformity or atrophy. Gait and ROM intact.  Skin: Warm and dry. Color is normal.  Neuro:  Strength and sensation are intact and no gross focal deficits noted.  Psych: Alert, appropriate and with normal affect.   LABORATORY DATA:  EKG:  EKG is ordered today. This shows NSR with septal Q's.   Lab Results  Component Value Date   WBC 5.8 06/07/2016   HGB 15.2 06/07/2016   HCT 46.2 (H) 06/07/2016   PLT 149 06/07/2016   GLUCOSE 85 06/07/2016   CHOL 201 (H) 08/13/2015   TRIG 133 08/13/2015   HDL 75 08/13/2015   LDLCALC 99 08/13/2015   ALT 21 06/07/2016   AST 24 06/07/2016   NA 142 06/07/2016   K 3.7 06/07/2016   CL 106 06/07/2016   CREATININE 1.03 (H) 06/07/2016   BUN 18 06/07/2016   CO2 25 06/07/2016   TSH 1.93 06/07/2016   INR 4.2 11/10/2016   Lab Results  Component Value Date   INR 4.2 11/10/2016   INR 4.6 10/20/2016   INR 1.6 09/15/2016     BNP (last 3 results) No results for input(s): BNP in the last 8760 hours.  ProBNP (last 3 results) No results for input(s): PROBNP in the last 8760 hours.   Other Studies Reviewed Today: Myoview Impression from February 2015  Exercise Capacity: [place] with no exercise.  BP Response: Hypotensive blood pressure response.  Clinical Symptoms:  There is chest heaviness.  ECG Impression: No significant ST segment change suggestive of ischemia.  Comparison with Prior Nuclear Study: No images to compare  Overall Impression: Normal stress nuclear study.  LV Ejection Fraction: Study not gated. LV Wall Motion: NA  Kirk Ruths   Echo Study Conclusions from 08/2015  - Left ventricle: The cavity size was normal. Wall thickness was  normal. Systolic function was normal. The estimated ejection  fraction was in the range of 55% to 60%. Wall motion was normal;  there were no regional wall motion abnormalities. Doppler  parameters are consistent with abnormal left ventricular  relaxation (grade 1 diastolic dysfunction). - Mitral valve: Valve area by pressure half-time: 1.48 cm^2. Valve  area by continuity equation (using LVOT flow): 1.08 cm^2. - Pulmonary arteries: Systolic pressure was mildly increased. PA  peak pressure: 35 mm Hg (S).  Assessment/Plan: 1.  Persistent afib - she remains in NSR on low dose amiodarone. Noted to be poor candidate for ablation. They have opted for a conservative approach. I have left her on her current regimen. Surveillance labs today. Meds refilled today.   2. OSA - Compliance with CPAP encouraged.  She has not been using CPAP for quite some time.   3. HTN - Stable - I have left her on her current regimen.   4. Obesity - She has not been interested in lifestyle modification  5. LV Dysfunction - EF improved on last echo.   6. Chronic anticoagulation - no problems noted but noted to be running higher the past few times.   7. Memory issues - on medical therapy. She is pretty sedentary and seems to be in a gradual decline.   Current medicines are  reviewed with the patient today.  The patient does not have concerns regarding medicines other than what has been noted above.  The following changes have been made:  See above.  Labs/ tests ordered today include:    Orders Placed This  Encounter  Procedures  . Basic metabolic panel  . CBC  . Hepatic function panel  . Lipid panel  . TSH  . EKG 12-Lead     Disposition:   FU with Dr. Rayann Heman in 6 months. I will be happy to see back in one year. Overall, she seems to be holding her own.     Patient is agreeable to this plan and will call if any problems develop in the interim.   SignedTruitt Merle, NP  12/07/2016 3:01 PM  [facility] [address] [place], [place]  [address] Phone: [phone] Fax: [phone]

## 2016-12-07 ENCOUNTER — Ambulatory Visit (INDEPENDENT_AMBULATORY_CARE_PROVIDER_SITE_OTHER): Payer: Medicare Other | Admitting: Nurse Practitioner

## 2016-12-07 ENCOUNTER — Encounter: Payer: Self-pay | Admitting: Nurse Practitioner

## 2016-12-07 VITALS — BP 110/70 | HR 68 | Ht 60.0 in | Wt 201.0 lb

## 2016-12-07 DIAGNOSIS — I481 Persistent atrial fibrillation: Secondary | ICD-10-CM

## 2016-12-07 DIAGNOSIS — Z7901 Long term (current) use of anticoagulants: Secondary | ICD-10-CM

## 2016-12-07 DIAGNOSIS — I10 Essential (primary) hypertension: Secondary | ICD-10-CM

## 2016-12-07 DIAGNOSIS — I5032 Chronic diastolic (congestive) heart failure: Secondary | ICD-10-CM | POA: Diagnosis not present

## 2016-12-07 DIAGNOSIS — I4819 Other persistent atrial fibrillation: Secondary | ICD-10-CM

## 2016-12-07 MED ORDER — SPIRONOLACTONE 25 MG PO TABS: 25.0000 mg | ORAL_TABLET | Freq: Every day | ORAL | 3 refills | Status: AC

## 2016-12-07 MED ORDER — FUROSEMIDE 40 MG PO TABS: 40.0000 mg | ORAL_TABLET | Freq: Every day | ORAL | 3 refills | Status: DC

## 2016-12-07 MED ORDER — DILTIAZEM HCL 30 MG PO TABS: 30.0000 mg | ORAL_TABLET | Freq: Four times a day (QID) | ORAL | 3 refills | Status: DC | PRN

## 2016-12-07 MED ORDER — NITROGLYCERIN 0.4 MG SL SUBL: 0.4000 mg | SUBLINGUAL_TABLET | SUBLINGUAL | 3 refills | Status: AC | PRN

## 2016-12-07 MED ORDER — ROSUVASTATIN CALCIUM 40 MG PO TABS: 40.0000 mg | ORAL_TABLET | Freq: Every day | ORAL | 3 refills | Status: DC

## 2016-12-07 MED ORDER — POTASSIUM CHLORIDE CRYS ER 20 MEQ PO TBCR: 20.0000 meq | EXTENDED_RELEASE_TABLET | Freq: Every day | ORAL | 3 refills | Status: DC

## 2016-12-07 MED ORDER — AMIODARONE HCL 200 MG PO TABS: 100.0000 mg | ORAL_TABLET | Freq: Every day | ORAL | 3 refills | Status: DC

## 2016-12-07 NOTE — Patient Instructions (Addendum)
We will be checking the following labs today - BMET, CBC, HPF, Lipids and TSH   Medication Instructions:    Continue with your current medicines.   I sent in your refills today    Testing/Procedures To Be Arranged:  N/A  Follow-Up:   See Dr. Rayann Heman in 6 months  See me in one year    Other Special Instructions:   N/A    If you need a refill on your cardiac medications before your next appointment, please call your pharmacy.   Call the [facility] office at [phone] if you have any questions, problems or concerns.

## 2016-12-08 ENCOUNTER — Ambulatory Visit (INDEPENDENT_AMBULATORY_CARE_PROVIDER_SITE_OTHER): Payer: Medicare Other | Admitting: General Practice

## 2016-12-08 DIAGNOSIS — Z5181 Encounter for therapeutic drug level monitoring: Secondary | ICD-10-CM

## 2016-12-08 DIAGNOSIS — I4891 Unspecified atrial fibrillation: Secondary | ICD-10-CM

## 2016-12-08 LAB — BASIC METABOLIC PANEL
BUN/Creatinine Ratio: 25 (ref 12–28)
BUN: 30 mg/dL — ABNORMAL HIGH (ref 8–27)
CO2: 23 mmol/L (ref 18–29)
Calcium: 9.5 mg/dL (ref 8.7–10.3)
Chloride: 101 mmol/L (ref 96–106)
Creatinine, Ser: 1.21 mg/dL — ABNORMAL HIGH (ref 0.57–1.00)
GFR calc Af Amer: 48 mL/min/{1.73_m2} — ABNORMAL LOW (ref >59)
GFR calc non Af Amer: 42 mL/min/{1.73_m2} — ABNORMAL LOW (ref >59)
Glucose: 99 mg/dL (ref 65–99)
Potassium: 4 mmol/L (ref 3.5–5.2)
Sodium: 138 mmol/L (ref 134–144)

## 2016-12-08 LAB — HEPATIC FUNCTION PANEL
ALT: 15 IU/L (ref 0–32)
AST: 18 IU/L (ref 0–40)
Albumin: 4.1 g/dL (ref 3.5–4.7)
Alkaline Phosphatase: 66 IU/L (ref 39–117)
Bilirubin Total: 1.1 mg/dL (ref 0.0–1.2)
Bilirubin, Direct: 0.27 mg/dL (ref 0.00–0.40)
Total Protein: 6.4 g/dL (ref 6.0–8.5)

## 2016-12-08 LAB — CBC
Hematocrit: 45.3 % (ref 34.0–46.6)
Hemoglobin: 15.1 g/dL (ref 11.1–15.9)
MCH: 32.7 pg (ref 26.6–33.0)
MCHC: 33.3 g/dL (ref 31.5–35.7)
MCV: 98 fL — ABNORMAL HIGH (ref 79–97)
Platelets: 179 10*3/uL (ref 150–379)
RBC: 4.62 x10E6/uL (ref 3.77–5.28)
RDW: 14.3 % (ref 12.3–15.4)
WBC: 6.3 10*3/uL (ref 3.4–10.8)

## 2016-12-08 LAB — LIPID PANEL
Chol/HDL Ratio: 2.8 ratio units (ref 0.0–4.4)
Cholesterol, Total: 215 mg/dL — ABNORMAL HIGH (ref 100–199)
HDL: 78 mg/dL (ref >39)
LDL Calculated: 111 mg/dL — ABNORMAL HIGH (ref 0–99)
Triglycerides: 130 mg/dL (ref 0–149)
VLDL Cholesterol Cal: 26 mg/dL (ref 5–40)

## 2016-12-08 LAB — POCT INR: INR: 1.4

## 2016-12-08 LAB — TSH: TSH: 1.74 u[IU]/mL (ref 0.450–4.500)

## 2016-12-08 NOTE — Patient Instructions (Signed)
Pre visit review using our clinic review tool, if applicable. No additional management support is needed unless otherwise documented below in the visit note. 

## 2016-12-22 ENCOUNTER — Ambulatory Visit (INDEPENDENT_AMBULATORY_CARE_PROVIDER_SITE_OTHER): Payer: Medicare Other | Admitting: General Practice

## 2016-12-22 DIAGNOSIS — I4891 Unspecified atrial fibrillation: Secondary | ICD-10-CM

## 2016-12-22 DIAGNOSIS — Z5181 Encounter for therapeutic drug level monitoring: Secondary | ICD-10-CM | POA: Diagnosis not present

## 2016-12-22 LAB — POCT INR: INR: 2.3

## 2016-12-22 NOTE — Patient Instructions (Signed)
Pre visit review using our clinic review tool, if applicable. No additional management support is needed unless otherwise documented below in the visit note. 

## 2017-01-11 ENCOUNTER — Ambulatory Visit: Payer: Medicare Other | Admitting: Family Medicine

## 2017-01-12 ENCOUNTER — Ambulatory Visit (INDEPENDENT_AMBULATORY_CARE_PROVIDER_SITE_OTHER)
Admission: RE | Admit: 2017-01-12 | Discharge: 2017-01-12 | Disposition: A | Discharge status: Home / Self Care | Payer: Medicare Other | Source: Ambulatory Visit | Attending: Family Medicine | Admitting: Family Medicine

## 2017-01-12 ENCOUNTER — Ambulatory Visit (INDEPENDENT_AMBULATORY_CARE_PROVIDER_SITE_OTHER): Payer: Medicare Other | Admitting: Family Medicine

## 2017-01-12 ENCOUNTER — Ambulatory Visit (INDEPENDENT_AMBULATORY_CARE_PROVIDER_SITE_OTHER): Payer: Medicare Other | Admitting: General Practice

## 2017-01-12 ENCOUNTER — Encounter: Payer: Self-pay | Admitting: Family Medicine

## 2017-01-12 VITALS — BP 130/80 | HR 54 | Temp 97.9°F | Wt 198.4 lb

## 2017-01-12 DIAGNOSIS — M5416 Radiculopathy, lumbar region: Secondary | ICD-10-CM | POA: Diagnosis not present

## 2017-01-12 DIAGNOSIS — I4891 Unspecified atrial fibrillation: Secondary | ICD-10-CM | POA: Diagnosis not present

## 2017-01-12 DIAGNOSIS — M545 Low back pain, unspecified: Secondary | ICD-10-CM

## 2017-01-12 DIAGNOSIS — Z5181 Encounter for therapeutic drug level monitoring: Secondary | ICD-10-CM

## 2017-01-12 LAB — POCT INR: INR: 2.6

## 2017-01-12 MED ORDER — HYDROCODONE-ACETAMINOPHEN 5-325 MG PO TABS: 1.0000 | ORAL_TABLET | Freq: Four times a day (QID) | ORAL | 0 refills | Status: DC | PRN

## 2017-01-12 NOTE — Progress Notes (Signed)
Pre visit review using our clinic review tool, if applicable. No additional management support is needed unless otherwise documented below in the visit note. 

## 2017-01-12 NOTE — Progress Notes (Signed)
Subjective:     Patient ID: Angel Meyer, female   DOB: [birth year], 83 y.o.   MRN: [id number]  HPI Patient is here with left-sided lower back pain. She's had 2 previous back surgeries in her lumbar area roughly 10 years ago and then another around 40 years ago. They do not have details. Her current pain started about a week and half ago. This occurred after they returned from the beach. There was a history of fall about 3 weeks ago but pain did not start until a week and half ago. Location is left lower lumbar area. She describes a dull to sharp pain which is 7 out of 10 intensity generally at its worst. They tried some heat and over-the-counter lidocaine patch with minimal improvement. Tried regular Tylenol without much improvement. Had some leftover hydrocodone which did seem to help slightly last night but did not completely relieve her pain.  Pain is slightly worse with back flexion. She has not noted any numbness or weakness lower extremity. Pain radiates somewhat into the right lower extremity toward the hip region and upper thigh. No skin rash. No loss of urine or stool control.  Past Medical History:  Diagnosis Date  . Arthritis   . Diastolic dysfunction    preserved EF,  CHF In setting of afib previously  . Dysrhythmia    afib  . History of blood transfusion   . Hyperlipidemia   . Hypertension   . Incontinence of urine   . OSA (obstructive sleep apnea)    uses CPAP  . Osteoporosis   . Persistent atrial fibrillation (HCC)    failed on Flecainide and Tikosyn  . Renal calculi   . Restless legs   . Rheumatic fever    age 85  . Systolic heart failure (HCC)    EF 30 to 35% per echo 08/2013   Past Surgical History:  Procedure Laterality Date  . ABDOMINAL HYSTERECTOMY    . BREAST SURGERY     benign biopsy  . CARDIOVERSION N/A 09/10/2013   Procedure: CARDIOVERSION;  Surgeon: [place] Grayer, MD;  Location: [facility];  Service: Cardiovascular;  Laterality: N/A;  . CARDIOVERSION N/A  11/16/2013   Procedure: CARDIOVERSION;  Surgeon: Sueanne Margarita, MD;  Location: [facility] ENDOSCOPY;  Service: Cardiovascular;  Laterality: N/A;  . CARDIOVERSION N/A 08/18/2015   Procedure: CARDIOVERSION;  Surgeon: Sueanne Margarita, MD;  Location: [facility] ENDOSCOPY;  Service: Cardiovascular;  Laterality: N/A;  . [place] removed  . REPLACEMENT TOTAL KNEE  2005  . SPINE SURGERY     laminectomy 1965, spinal fusion with rod 2005  . TONSILLECTOMY      reports that she has never smoked. She has never used smokeless tobacco. She reports that she drinks alcohol. She reports that she does not use drugs. family history includes Cancer in her mother and paternal grandfather; Diabetes in her mother. Allergies  Allergen Reactions  . Ciprofloxacin Nausea And Vomiting  . Demerol [Meperidine] Nausea And Vomiting  . Sulfa Antibiotics Nausea And Vomiting    And all derivatives -  GI Bleeding  . Sulfacetamide Sodium Nausea And Vomiting     Review of Systems  Constitutional: Negative for appetite change, chills, fever and unexpected weight change.  Respiratory: Negative for shortness of breath.   Cardiovascular: Negative for chest pain.  Gastrointestinal: Negative for abdominal pain.  Genitourinary: Negative for dysuria.  Musculoskeletal: Positive for back pain.  Neurological: Negative for weakness and numbness.  Objective:   Physical Exam  Constitutional: She appears well-developed and well-nourished.  Cardiovascular: Normal rate.   Pulmonary/Chest: Effort normal and breath sounds normal. No respiratory distress. She has no wheezes. She has no rales.  Musculoskeletal:  Straight leg raise causes some pain in the low back area on the left but none down the left lower extremity No spinal tenderness. No reproducible tenderness.  Neurological:  She has full strength with plantarflexion, dorsiflexion, and knee extension. Symmetric reflexes lower extremities.       Assessment:      Left lower lumbar back pain with recent acute onset week and half ago. She's had 2 previous surgeries as above. Even though she had recent fall,  pain did not start until week and half after the fall. Doubt compression fracture.    Plan:     -Obtain x-rays lumbosacral spine to further assess -Limited Vicodin 5 mg #30 one every 6 hours as needed for severe pain -May need to get back and orthopedic specialist soon. Will check x-rays above first -Follow-up immediately for any sudden weakness or progressive symptoms  [name] MD [facility]

## 2017-01-12 NOTE — Progress Notes (Signed)
I agree with this plan.

## 2017-01-12 NOTE — Progress Notes (Signed)
Subjective:   Angel Meyer is a 83 y.o. female who presents for Medicare Annual (Subsequent) preventive examination.  Note in error; the patient had an acute issues and did  AWV WAS NOT COMPLETED  VISIT WAS ACUTE WITH DR Elease Hashimoto ONLY             Objective:     Vitals: There were no vitals taken for this visit.  There is no height or weight on file to calculate BMI.   Tobacco History  Smoking Status  . Never Smoker  Smokeless Tobacco  . Never Used     Counseling given: Not Answered   Past Medical History:  Diagnosis Date  . Arthritis   . Diastolic dysfunction    preserved EF,  CHF In setting of afib previously  . Dysrhythmia    afib  . History of blood transfusion   . Hyperlipidemia   . Hypertension   . Incontinence of urine   . OSA (obstructive sleep apnea)    uses CPAP  . Osteoporosis   . Persistent atrial fibrillation (HCC)    failed on Flecainide and Tikosyn  . Renal calculi   . Restless legs   . Rheumatic fever    age 60  . Systolic heart failure (HCC)    EF 30 to 35% per echo 08/2013   Past Surgical History:  Procedure Laterality Date  . ABDOMINAL HYSTERECTOMY    . BREAST SURGERY     benign biopsy  . CARDIOVERSION N/A 09/10/2013   Procedure: CARDIOVERSION;  Surgeon: Thompson Grayer, MD;  Location: [facility];  Service: Cardiovascular;  Laterality: N/A;  . CARDIOVERSION N/A 11/16/2013   Procedure: CARDIOVERSION;  Surgeon: Sueanne Margarita, MD;  Location: [facility] ENDOSCOPY;  Service: Cardiovascular;  Laterality: N/A;  . CARDIOVERSION N/A 08/18/2015   Procedure: CARDIOVERSION;  Surgeon: Sueanne Margarita, MD;  Location: [facility] ENDOSCOPY;  Service: Cardiovascular;  Laterality: N/A;  . [place] removed  . REPLACEMENT TOTAL KNEE  2005  . SPINE SURGERY     laminectomy 1965, spinal fusion with rod 2005  . TONSILLECTOMY     Family History  Problem Relation Age of Onset  . Cancer Mother     breast  . Diabetes Mother   . Cancer Paternal  Grandfather    History  Sexual Activity  . Sexual activity: Not Currently    Outpatient Encounter Prescriptions as of 01/12/2017  Medication Sig  . amiodarone (PACERONE) 200 MG tablet Take 0.5 tablets (100 mg total) by mouth daily.  . clobetasol cream (TEMOVATE) 6.64 % Apply 1 application topically 2 (two) times daily. For lichens simplex chronicus  . diltiazem (CARDIZEM) 30 MG tablet Take 1 tablet (30 mg total) by mouth every 6 (six) hours as needed (AFIB w/pulse >100 bpm until resolved).  . donepezil (ARICEPT ODT) 10 MG disintegrating tablet Take 1 tablet (10 mg total) by mouth at bedtime.  Marland Kitchen escitalopram (LEXAPRO) 10 MG tablet Take 1 tablet (10 mg total) by mouth daily.  . fluocinonide ointment (LIDEX) 0.05 % APPLY ON THE SKIN BID for chronic rash  . furosemide (LASIX) 40 MG tablet Take 1 tablet (40 mg total) by mouth daily.  Marland Kitchen HYDROcodone-acetaminophen (NORCO/VICODIN) 5-325 MG tablet Take 1 tablet by mouth every 6 (six) hours as needed for moderate pain.  . Melatonin 5 MG TABS Take 10 mg by mouth at bedtime.   . memantine (NAMENDA) 10 MG tablet Take 1 tablet (10 mg total) by mouth 2 (two)  times daily.  . Misc. Devices (ROLLER [place]) MISC Used as Directed.  . Multiple Vitamins-Minerals (PRESERVISION AREDS 2) CAPS Take 1 capsule by mouth 2 (two) times daily.  . nitroGLYCERIN (NITROSTAT) 0.4 MG SL tablet Place 1 tablet (0.4 mg total) under the tongue every 5 (five) minutes as needed for chest pain.  Marland Kitchen oxybutynin (DITROPAN-XL) 10 MG 24 hr tablet Take 10 mg by mouth daily.   . potassium chloride SA (K-DUR,KLOR-CON) 20 MEQ tablet Take 1 tablet (20 mEq total) by mouth daily.  Marland Kitchen rOPINIRole (REQUIP) 1 MG tablet Take 1 tablet (1 mg total) by mouth See admin instructions. Take 1 tablet (1 mg) by mouth daily at 4pm and at bedtime  . rosuvastatin (CRESTOR) 40 MG tablet Take 1 tablet (40 mg total) by mouth at bedtime.  Marland Kitchen spironolactone (ALDACTONE) 25 MG tablet Take 1 tablet (25 mg total) by mouth  daily.  Marland Kitchen trimethoprim (TRIMPEX) 100 MG tablet Take 100 mg by mouth daily.   Marland Kitchen warfarin (COUMADIN) 2.5 MG tablet Take as directed by anticoagulation clinic.   No facility-administered encounter medications on file as of 01/12/2017.     Activities of Daily Living No flowsheet data found.  Patient Care Team: [name], MD as PCP - General (Family Medicine)    Assessment:     Exercise Activities and Dietary recommendations    Goals    None     Fall Risk Fall Risk  12/17/2015 10/02/2014 08/22/2013  Falls in the past year? No No No   Depression Screen PHQ 2/9 Scores 12/17/2015 10/02/2014 08/22/2013  PHQ - 2 Score 0 0 0     Cognitive Function        Immunization History  Administered Date(s) Administered  . Influenza, High Dose Seasonal PF 08/07/2013, 08/09/2014, 07/17/2015, 06/29/2016  . Pneumococcal Conjugate-13 12/19/2008   Screening Tests Health Maintenance  Topic Date Due  . TETANUS/TDAP  06/09/1954  . PNA vac Low Risk Adult (2 of 2 - PPSV23) 12/19/2009  . INFLUENZA VACCINE  04/27/2017  . DEXA SCAN  Completed      Plan:     No AWV DUE TO ACUTE ILLNESS VISIT WITH DR Moise Boring, RN  01/12/2017

## 2017-01-12 NOTE — Patient Instructions (Signed)
Follow up immediately for any weakness or progressive pain Go for x-rays as discussed.

## 2017-01-12 NOTE — Patient Instructions (Signed)
Pre visit review using our clinic review tool, if applicable. No additional management support is needed unless otherwise documented below in the visit note. 

## 2017-01-14 ENCOUNTER — Other Ambulatory Visit: Payer: Self-pay | Admitting: Family Medicine

## 2017-01-14 DIAGNOSIS — G8929 Other chronic pain: Secondary | ICD-10-CM

## 2017-01-14 DIAGNOSIS — M545 Low back pain: Principal | ICD-10-CM

## 2017-01-19 DIAGNOSIS — M5136 Other intervertebral disc degeneration, lumbar region: Secondary | ICD-10-CM | POA: Diagnosis not present

## 2017-01-19 DIAGNOSIS — S32000A Wedge compression fracture of unspecified lumbar vertebra, initial encounter for closed fracture: Secondary | ICD-10-CM | POA: Diagnosis not present

## 2017-01-24 ENCOUNTER — Other Ambulatory Visit: Payer: Self-pay | Admitting: Family Medicine

## 2017-01-25 ENCOUNTER — Other Ambulatory Visit: Payer: Self-pay | Admitting: General Practice

## 2017-01-25 ENCOUNTER — Encounter: Payer: Self-pay | Admitting: Family Medicine

## 2017-01-25 MED ORDER — WARFARIN SODIUM 2.5 MG PO TABS: ORAL_TABLET | ORAL | 1 refills | Status: AC

## 2017-01-26 DIAGNOSIS — M5136 Other intervertebral disc degeneration, lumbar region: Secondary | ICD-10-CM | POA: Diagnosis not present

## 2017-02-01 ENCOUNTER — Telehealth (HOSPITAL_COMMUNITY): Payer: Self-pay

## 2017-02-01 ENCOUNTER — Other Ambulatory Visit (HOSPITAL_COMMUNITY): Payer: Self-pay | Admitting: Interventional Radiology

## 2017-02-01 DIAGNOSIS — M4850XA Collapsed vertebra, not elsewhere classified, site unspecified, initial encounter for fracture: Principal | ICD-10-CM

## 2017-02-01 DIAGNOSIS — IMO0001 Reserved for inherently not codable concepts without codable children: Secondary | ICD-10-CM

## 2017-02-01 NOTE — Telephone Encounter (Signed)
Called to schedule consult, left message for pt to return call. AW 

## 2017-02-04 ENCOUNTER — Ambulatory Visit (HOSPITAL_COMMUNITY)
Admission: RE | Admit: 2017-02-04 | Discharge: 2017-02-04 | Disposition: A | Discharge status: Home / Self Care | Payer: Medicare Other | Source: Ambulatory Visit | Attending: Interventional Radiology | Admitting: Interventional Radiology

## 2017-02-04 DIAGNOSIS — M4850XA Collapsed vertebra, not elsewhere classified, site unspecified, initial encounter for fracture: Secondary | ICD-10-CM | POA: Diagnosis not present

## 2017-02-04 DIAGNOSIS — IMO0001 Reserved for inherently not codable concepts without codable children: Secondary | ICD-10-CM

## 2017-02-04 HISTORY — PX: IR RADIOLOGIST EVAL & MGMT: IMG5224

## 2017-02-07 ENCOUNTER — Encounter (HOSPITAL_COMMUNITY): Payer: Self-pay | Admitting: Interventional Radiology

## 2017-02-07 ENCOUNTER — Other Ambulatory Visit: Payer: Self-pay | Admitting: Radiology

## 2017-02-07 ENCOUNTER — Other Ambulatory Visit (HOSPITAL_COMMUNITY): Payer: Self-pay | Admitting: Interventional Radiology

## 2017-02-07 DIAGNOSIS — M4850XA Collapsed vertebra, not elsewhere classified, site unspecified, initial encounter for fracture: Principal | ICD-10-CM

## 2017-02-07 DIAGNOSIS — IMO0001 Reserved for inherently not codable concepts without codable children: Secondary | ICD-10-CM

## 2017-02-07 DIAGNOSIS — M549 Dorsalgia, unspecified: Secondary | ICD-10-CM

## 2017-02-08 ENCOUNTER — Encounter (HOSPITAL_COMMUNITY): Payer: Self-pay

## 2017-02-08 ENCOUNTER — Ambulatory Visit (HOSPITAL_COMMUNITY)
Admission: RE | Admit: 2017-02-08 | Discharge: 2017-02-08 | Disposition: A | Discharge status: Home / Self Care | Payer: Medicare Other | Source: Ambulatory Visit | Attending: Interventional Radiology | Admitting: Interventional Radiology

## 2017-02-08 DIAGNOSIS — Z7901 Long term (current) use of anticoagulants: Secondary | ICD-10-CM | POA: Diagnosis not present

## 2017-02-08 DIAGNOSIS — S32009A Unspecified fracture of unspecified lumbar vertebra, initial encounter for closed fracture: Secondary | ICD-10-CM | POA: Diagnosis not present

## 2017-02-08 DIAGNOSIS — G2581 Restless legs syndrome: Secondary | ICD-10-CM | POA: Diagnosis not present

## 2017-02-08 DIAGNOSIS — M545 Low back pain: Secondary | ICD-10-CM | POA: Diagnosis not present

## 2017-02-08 DIAGNOSIS — G4733 Obstructive sleep apnea (adult) (pediatric): Secondary | ICD-10-CM | POA: Diagnosis not present

## 2017-02-08 DIAGNOSIS — I5020 Unspecified systolic (congestive) heart failure: Secondary | ICD-10-CM | POA: Insufficient documentation

## 2017-02-08 DIAGNOSIS — E785 Hyperlipidemia, unspecified: Secondary | ICD-10-CM | POA: Diagnosis not present

## 2017-02-08 DIAGNOSIS — I481 Persistent atrial fibrillation: Secondary | ICD-10-CM | POA: Insufficient documentation

## 2017-02-08 DIAGNOSIS — W19XXXA Unspecified fall, initial encounter: Secondary | ICD-10-CM | POA: Insufficient documentation

## 2017-02-08 DIAGNOSIS — M4850XA Collapsed vertebra, not elsewhere classified, site unspecified, initial encounter for fracture: Secondary | ICD-10-CM

## 2017-02-08 DIAGNOSIS — Y92009 Unspecified place in unspecified non-institutional (private) residence as the place of occurrence of the external cause: Secondary | ICD-10-CM | POA: Insufficient documentation

## 2017-02-08 DIAGNOSIS — S32039A Unspecified fracture of third lumbar vertebra, initial encounter for closed fracture: Secondary | ICD-10-CM | POA: Insufficient documentation

## 2017-02-08 DIAGNOSIS — M4856XA Collapsed vertebra, not elsewhere classified, lumbar region, initial encounter for fracture: Secondary | ICD-10-CM | POA: Diagnosis not present

## 2017-02-08 DIAGNOSIS — I110 Hypertensive heart disease with heart failure: Secondary | ICD-10-CM | POA: Insufficient documentation

## 2017-02-08 DIAGNOSIS — M549 Dorsalgia, unspecified: Secondary | ICD-10-CM

## 2017-02-08 DIAGNOSIS — M1990 Unspecified osteoarthritis, unspecified site: Secondary | ICD-10-CM | POA: Insufficient documentation

## 2017-02-08 DIAGNOSIS — Z5309 Procedure and treatment not carried out because of other contraindication: Secondary | ICD-10-CM | POA: Insufficient documentation

## 2017-02-08 DIAGNOSIS — M810 Age-related osteoporosis without current pathological fracture: Secondary | ICD-10-CM | POA: Diagnosis not present

## 2017-02-08 DIAGNOSIS — IMO0001 Reserved for inherently not codable concepts without codable children: Secondary | ICD-10-CM

## 2017-02-08 LAB — CBC WITH DIFFERENTIAL/PLATELET
BASOS ABS: 0 10*3/uL (ref 0.0–0.1)
BASOS PCT: 1 %
EOS ABS: 0.1 10*3/uL (ref 0.0–0.7)
Eosinophils Relative: 1 %
HEMATOCRIT: 43.8 % (ref 36.0–46.0)
Hemoglobin: 14.3 g/dL (ref 12.0–15.0)
Lymphocytes Relative: 19 %
Lymphs Abs: 1 10*3/uL (ref 0.7–4.0)
MCH: 32.6 pg (ref 26.0–34.0)
MCHC: 32.6 g/dL (ref 30.0–36.0)
MCV: 100 fL (ref 78.0–100.0)
MONO ABS: 0.6 10*3/uL (ref 0.1–1.0)
MONOS PCT: 12 %
NEUTROS ABS: 3.5 10*3/uL (ref 1.7–7.7)
NEUTROS PCT: 67 %
Platelets: 175 10*3/uL (ref 150–400)
RBC: 4.38 MIL/uL (ref 3.87–5.11)
RDW: 15.2 % (ref 11.5–15.5)
WBC: 5.2 10*3/uL (ref 4.0–10.5)

## 2017-02-08 LAB — BASIC METABOLIC PANEL
ANION GAP: 8 (ref 5–15)
BUN: 18 mg/dL (ref 6–20)
CALCIUM: 9.2 mg/dL (ref 8.9–10.3)
CO2: 24 mmol/L (ref 22–32)
CREATININE: 1.21 mg/dL — AB (ref 0.44–1.00)
Chloride: 105 mmol/L (ref 101–111)
GFR, EST AFRICAN AMERICAN: 47 mL/min — AB (ref >60)
GFR, EST NON AFRICAN AMERICAN: 41 mL/min — AB (ref >60)
GLUCOSE: 92 mg/dL (ref 65–99)
Potassium: 4.3 mmol/L (ref 3.5–5.1)
Sodium: 137 mmol/L (ref 135–145)

## 2017-02-08 LAB — PROTIME-INR
INR: 1.42
PROTHROMBIN TIME: 17.5 s — AB (ref 11.4–15.2)

## 2017-02-08 MED ORDER — CEFAZOLIN SODIUM-DEXTROSE 2-4 GM/100ML-% IV SOLN: 2.0000 g | INTRAVENOUS | Status: DC

## 2017-02-08 MED ORDER — SODIUM CHLORIDE 0.9 % IV SOLN
INTRAVENOUS | Status: DC
  Administered 2017-02-08: 13:00:00 via INTRAVENOUS

## 2017-02-08 MED ORDER — CEFAZOLIN SODIUM-DEXTROSE 2-4 GM/100ML-% IV SOLN
INTRAVENOUS | Status: AC
  Filled 2017-02-08: qty 100

## 2017-02-08 NOTE — H&P (Signed)
Chief Complaint: Patient was seen in consultation today for Lumbar 3 vertebroplasty/kyphoplasty at the request of Dr Girtha Hake  Referring Physician(s): Dr b Elease Hashimoto  Supervising Physician: Luanne Bras  Patient Status: [facility] - Out-pt  History of Present Illness: Angel Meyer is a 83 y.o. female   Pt fell at home 12/2016 Has had persistent and worsening back pain since then Pain meds without relief MRI was reviewed with Dr Estanislado Pandy at consultation 02/04/2017 Confirms Lumbar 3 acute fracture Scheduled now for vertebroplasty/kyphoplasty   Past Medical History:  Diagnosis Date  . Arthritis   . Diastolic dysfunction    preserved EF,  CHF In setting of afib previously  . Dysrhythmia    afib  . History of blood transfusion   . Hyperlipidemia   . Hypertension   . Incontinence of urine   . OSA (obstructive sleep apnea)    uses CPAP  . Osteoporosis   . Persistent atrial fibrillation (HCC)    failed on Flecainide and Tikosyn  . Renal calculi   . Restless legs   . Rheumatic fever    age 6  . Systolic heart failure (HCC)    EF 30 to 35% per echo 08/2013    Past Surgical History:  Procedure Laterality Date  . ABDOMINAL HYSTERECTOMY    . BREAST SURGERY     benign biopsy  . CARDIOVERSION N/A 09/10/2013   Procedure: CARDIOVERSION;  Surgeon: Thompson Grayer, MD;  Location: [facility];  Service: Cardiovascular;  Laterality: N/A;  . CARDIOVERSION N/A 11/16/2013   Procedure: CARDIOVERSION;  Surgeon: Sueanne Margarita, MD;  Location: [facility] ENDOSCOPY;  Service: Cardiovascular;  Laterality: N/A;  . CARDIOVERSION N/A 08/18/2015   Procedure: CARDIOVERSION;  Surgeon: Sueanne Margarita, MD;  Location: [facility] ENDOSCOPY;  Service: Cardiovascular;  Laterality: N/A;  . IR RADIOLOGIST EVAL & MGMT  02/04/2017  . [place] removed  . REPLACEMENT TOTAL KNEE  2005  . SPINE SURGERY     laminectomy 1965, spinal fusion with rod 2005  . TONSILLECTOMY      Allergies: Ciprofloxacin;  Demerol [meperidine]; Sulfa antibiotics; and Sulfacetamide sodium  Medications: Prior to Admission medications   Medication Sig Start Date End Date Taking? Authorizing Provider  amiodarone (PACERONE) 200 MG tablet Take 0.5 tablets (100 mg total) by mouth daily. 12/07/16  Yes Burtis Junes, NP  clobetasol cream (TEMOVATE) 0.76 % Apply 1 application topically 2 (two) times daily as needed. For lichens simplex chronicus 08/13/15  Yes [provider]  diltiazem (CARDIZEM) 30 MG tablet Take 1 tablet (30 mg total) by mouth every 6 (six) hours as needed (AFIB w/pulse >100 bpm until resolved). 12/07/16  Yes Burtis Junes, NP  donepezil (ARICEPT ODT) 10 MG disintegrating tablet Take 1 tablet (10 mg total) by mouth at bedtime. 06/29/16  Yes Burchette, Alinda Sierras, MD  escitalopram (LEXAPRO) 10 MG tablet Take 1 tablet (10 mg total) by mouth daily. 12/17/15  Yes Burchette, Alinda Sierras, MD  fluocinonide cream (LIDEX) 2.26 % Apply 1 application topically 2 (two) times daily as needed (CHRONIC RASH).   Yes [provider]  furosemide (LASIX) 40 MG tablet Take 1 tablet (40 mg total) by mouth daily. 12/07/16  Yes Burtis Junes, NP  HYDROcodone-acetaminophen (NORCO) 10-325 MG tablet TK 1 T PO QID PRN 01/19/17  Yes Suella Broad, MD  Melatonin 5 MG TABS Take 10 mg by mouth at bedtime.    Yes [provider]  memantine (NAMENDA) 10  MG tablet Take 1 tablet (10 mg total) by mouth 2 (two) times daily. 06/29/16  Yes Burchette, Alinda Sierras, MD  Multiple Vitamins-Minerals (PRESERVISION AREDS 2) CAPS Take 1 capsule by mouth 2 (two) times daily.   Yes [provider]  nitroGLYCERIN (NITROSTAT) 0.4 MG SL tablet Place 1 tablet (0.4 mg total) under the tongue every 5 (five) minutes as needed for chest pain. 12/07/16  Yes Burtis Junes, NP  oxybutynin (DITROPAN-XL) 10 MG 24 hr tablet Take 10 mg by mouth daily.  05/09/15  Yes [provider]  potassium chloride SA (K-DUR,KLOR-CON) 20 MEQ tablet  Take 1 tablet (20 mEq total) by mouth daily. 12/07/16  Yes Burtis Junes, NP  rOPINIRole (REQUIP) 1 MG tablet Take 1 tablet (1 mg total) by mouth See admin instructions. Take 1 tablet (1 mg) by mouth daily at 4pm and at bedtime 06/29/16  Yes Burchette, Alinda Sierras, MD  rosuvastatin (CRESTOR) 40 MG tablet Take 1 tablet (40 mg total) by mouth at bedtime. 12/07/16  Yes Burtis Junes, NP  spironolactone (ALDACTONE) 25 MG tablet Take 1 tablet (25 mg total) by mouth daily. 12/07/16  Yes Burtis Junes, NP  trimethoprim (TRIMPEX) 100 MG tablet Take 100 mg by mouth daily.  05/18/15  Yes [provider]  warfarin (COUMADIN) 2.5 MG tablet Take as directed by anticoagulation clinic. Patient taking differently: Take 2.5 mg by mouth every evening. Take as directed by anticoagulation clinic. 01/25/17  Yes Burchette, Alinda Sierras, MD  Misc. Devices (ROLLER [place]) MISC Used as Directed. 01/12/16   Burchette, Alinda Sierras, MD     Family History  Problem Relation Age of Onset  . Cancer Mother        breast  . Diabetes Mother   . Cancer Paternal Grandfather     Social History   Social History  . Marital status: Divorced    Spouse name: N/A  . Number of children: N/A  . Years of education: N/A   Social History Main Topics  . Smoking status: Never Smoker  . Smokeless tobacco: Never Used  . Alcohol use Yes     Comment: glass of wine each day  . Drug use: No  . Sexual activity: Not Currently   Other Topics Concern  . None   Social History Narrative   Lives in [place] alone.  Spends 4-6 months per year in [place] with her daughter.   Retired Network engineer    Review of Systems: A 12 point ROS discussed and pertinent positives are indicated in the HPI above.  All other systems are negative.  Review of Systems  Constitutional: Positive for activity change. Negative for appetite change, fatigue and fever.  Respiratory: Negative for cough and shortness of breath.   Gastrointestinal: Negative for  abdominal pain and nausea.  Musculoskeletal: Positive for back pain and gait problem.  Neurological: Positive for weakness.  Psychiatric/Behavioral: Negative for behavioral problems and confusion.    Vital Signs: BP (!) 149/91   Pulse (!) 110   Temp 98.4 F (36.9 C)   Resp 20   Ht 5' (1.524 m)   Wt 200 lb (90.7 kg)   SpO2 91%   BMI 39.06 kg/m   Physical Exam  Constitutional: She is oriented to person, place, and time.  Cardiovascular: Regular rhythm and normal heart sounds.   Irreg rate  Pulmonary/Chest: Effort normal and breath sounds normal.  Abdominal: Soft. Bowel sounds are normal. There is no tenderness.  Musculoskeletal: Normal range of motion.  Low back pain  Neurological: She is alert and oriented to person, place, and time.  Skin: Skin is warm and dry.  Psychiatric: She has a normal mood and affect. Her behavior is normal. Judgment and thought content normal.  Nursing note and vitals reviewed.   Mallampati Score:  MD Evaluation Airway: WNL Heart: WNL Abdomen: WNL Chest/ Lungs: WNL ASA  Classification: 3 Mallampati/Airway Score: One  Imaging: Dg Lumbar Spine Complete  Result Date: 01/12/2017 CLINICAL DATA:  Low back pain.  History of laminectomy and fusion EXAM: LUMBAR SPINE - COMPLETE 4+ VIEW COMPARISON:  Chest two-view 08/13/2015 FINDINGS: Bilateral pedicle screw fusion at L1-2. Moderate compression fracture of L1 is probably present on the prior chest x-ray and probably is chronic. Mild endplate deformities involving the superior and inferior plates of L3 in the superior endplate of L4 appear chronic. Moderate disc degeneration at L4-5 and L5-S1. Arterial calcification IMPRESSION: Pedicle screw fusion at L1-2. Moderate compression fracture L1 is probably chronic. Mild endplate depressions at L3 and L4 probably chronic. Comparison with prior lumbar spine x-rays or cross-sectional imaging is suggested if available. Atherosclerotic aorta. Electronically Signed    By: Franchot Gallo M.D.   On: 01/12/2017 16:31   Ir Radiologist Eval & Mgmt  Result Date: 02/07/2017 EXAM: NEW PATIENT OFFICE VISIT CHIEF COMPLAINT: Severe low back pain secondary to compression fracture at L3. Current Pain Level: 1-10 HISTORY OF PRESENT ILLNESS: The patient is an 83 year old right-handed lady referred for evaluation of pain relief due to compression fracture at L3. The patient is accompanied by her daughter. The patient reports progressive worsening of the low back pain following a fall in April of 2018. The pain apparently has been worsening over this period of time to where it has been so severe that the patient has been crying due to severe pain. She describes this pain as being a 10 out of 10 without pain medications. With the pain medications it is a 7 out of 10. It has left her essentially immobile not able to perform her daily activities at home. She describes the pain worse with turning or stooping or coughing. There is intermittent numbness in the toes of the left foot. The patient had a limp gait even prior to her developing these fractures as per her daughter. She denies any autonomic dysfunction of her bowel or bladder activities. However, does report urgency incontinence which is a long-term symptom. Her appetite has slightly improved over the past few days. She denies any symptoms of dysuria, hematuria or of foul-smelling urine. She denies any chest pain, shortness of breath, ankle edema, coughing, wheezing or hemoptysis. Denies any constipation, diarrhea, difficulty swallowing liquids or solids, or bloody stools. Past Medical History: Arthritis, depression, high blood pressure, high cholesterol, heart problems. The patient has had history of transient atrial fibrillation for which she has been taking Coumadin. Previous surgical history of back surgeries x2, left knee replacement, right shoulder surgery, hysterectomy, kidney stone removal. Medications: Warfarin, trimethoprim,  ropinirole, spironolactone, rosuvastatin, potassium chloride, oxybutynin, Nitrostat, melatonin, hydrochlorothiazide acetaminophen, furosemide, flucinonide, escitalopram, donepezil, diltiazem, clobetasol, amiodarone, memantine. Allergies: Sulfa antibiotics, Demerol, Cipro all of which cause generalized itching and rash. Social History: The patient lives with her daughter. She denies smoking cigarettes or drinking alcohol or using illicit chemicals. Patient is not on any special diet. Family History: Mother deceased age 23 of breast carcinoma. Father deceased age 55 of old age. REVIEW OF SYSTEMS: Negative unless as mentioned above. PHYSICAL EXAMINATION: In obvious acute distress on account of  her severe pain. Patient unable to lay still because of the pain in her back. Affect appropriate for situation. Neurologically, otherwise, alert, awake and oriented to time, place, space. No lateralizing cranial nerve abnormalities, motor, sensory or coordination difficulties. Station and gait not tested. ASSESSMENT AND PLAN: The patient's recent MRI of the lumbosacral spine was reviewed with her and her daughter. This reveals the presence of signal abnormalities at L3 suggestive of acute to subacute fracture with minimal retropulsion. Also demonstrated are old fractures at T12-L1 and postsurgical fusion changes at L1-L2 with laminectomy defects extending from L1-L2 to L4-L5. The option of vertebral body augmentation at L3, to alleviate pain, and also to prevent further collapse of vertebral body was reviewed with the patient and patient's daughter. The procedure, the risks, the benefits and the alternatives were all reviewed in detail. Questions were answered to their satisfaction. The patient would like to proceed with the vertebral body augmentation at L3. This could be either a balloon kyphoplasty or vertebroplasty depending on the anatomy at the time of the procedure. The patient has already been off Coumadin for 2 days. She  was asked to refrain from taking any more Coumadin until after her procedure which has been scheduled for Tuesday. The patient will have a PT and an INR performed on Tuesday. Should this be within an acceptable range, the procedure will be performed. In the meantime, the patient has been bridged with aspirin 81 mg a day to be taken daily until after the procedure. The patient reports no side effects such as bleeding ulcers or severe nausea or of an allergic reactions to aspirin. Both the daughter and the patient leave with good understanding and agreement with the above management plan. They were asked to call should they have any concerns or questions. Electronically Signed   By: Luanne Bras M.D.   On: 02/04/2017 14:24    Labs:  CBC:  Recent Labs  06/07/16 1007 12/07/16 1507  WBC 5.8 6.3  HGB 15.2  --   HCT 46.2* 45.3  PLT 149 179    COAGS:  Recent Labs  11/10/16 12/08/16 12/22/16 01/12/17 1337  INR 4.2 1.4 2.3 2.6    BMP:  Recent Labs  06/07/16 1007 12/07/16 1507  NA 142 138  K 3.7 4.0  CL 106 101  CO2 25 23  GLUCOSE 85 99  BUN 18 30*  CALCIUM 9.4 9.5  CREATININE 1.03* 1.21*  GFRNONAA  --  42*  GFRAA  --  48*    LIVER FUNCTION TESTS:  Recent Labs  06/07/16 1007 12/07/16 1507  BILITOT 1.3* 1.1  AST 24 18  ALT 21 15  ALKPHOS 59 66  PROT 6.2 6.4  ALBUMIN 4.1 4.1    TUMOR MARKERS: No results for input(s): AFPTM, CEA, CA199, CHROMGRNA in the last 8760 hours.  Assessment and Plan:  Low back pain worsening since fall 12/2016 Acute Lumbar 3 fracture on MRI Now scheduled for VP/KP Risks and Benefits discussed with the patient including, but not limited to education regarding the natural healing process of compression fractures without intervention, bleeding, infection, cement migration which may cause spinal cord damage, paralysis, pulmonary embolism or even death. All of the patient's questions were answered, patient is agreeable to proceed. Consent  signed and in chart.   Thank you for this interesting consult.  I greatly enjoyed meeting Sarann Tregre and look forward to participating in their care.  A copy of this report was sent to the requesting provider on this  date.  Electronically Signed: Kailea Dannemiller A 02/08/2017, 12:37 PM   I spent a total of  30 Minutes   in face to face in clinical consultation, greater than 50% of which was counseling/coordinating care for VP/KP L3

## 2017-02-08 NOTE — Progress Notes (Signed)
Patient ID: Angel Meyer, female   DOB: October 20, 1932, 83 y.o.   MRN: [id number] INR. Patients INR  1.42, and PT 17.5 (high) from today discussed with patient and daughter.. Given the potential  for  increased  risk of a hemorrhagic complication, procedure postponed to Thursday late morning. PT  and INR will be redrawn.at that time. Patient advised to take aspirin 81 mg per day until after the procedure. S.Mariely Mahr MD

## 2017-02-09 ENCOUNTER — Ambulatory Visit: Payer: Medicare Other

## 2017-02-09 ENCOUNTER — Other Ambulatory Visit: Payer: Self-pay | Admitting: Radiology

## 2017-02-10 ENCOUNTER — Other Ambulatory Visit (HOSPITAL_COMMUNITY): Payer: Self-pay | Admitting: Interventional Radiology

## 2017-02-10 ENCOUNTER — Ambulatory Visit (HOSPITAL_COMMUNITY)
Admission: RE | Admit: 2017-02-10 | Discharge: 2017-02-10 | Disposition: A | Discharge status: Home / Self Care | Payer: Medicare Other | Source: Ambulatory Visit | Attending: Interventional Radiology | Admitting: Interventional Radiology

## 2017-02-10 ENCOUNTER — Encounter (HOSPITAL_COMMUNITY): Payer: Self-pay

## 2017-02-10 DIAGNOSIS — M1990 Unspecified osteoarthritis, unspecified site: Secondary | ICD-10-CM | POA: Diagnosis not present

## 2017-02-10 DIAGNOSIS — E785 Hyperlipidemia, unspecified: Secondary | ICD-10-CM | POA: Diagnosis not present

## 2017-02-10 DIAGNOSIS — M4856XA Collapsed vertebra, not elsewhere classified, lumbar region, initial encounter for fracture: Secondary | ICD-10-CM | POA: Diagnosis not present

## 2017-02-10 DIAGNOSIS — M545 Low back pain: Secondary | ICD-10-CM | POA: Diagnosis not present

## 2017-02-10 DIAGNOSIS — M5136 Other intervertebral disc degeneration, lumbar region: Secondary | ICD-10-CM | POA: Insufficient documentation

## 2017-02-10 DIAGNOSIS — M549 Dorsalgia, unspecified: Secondary | ICD-10-CM

## 2017-02-10 DIAGNOSIS — I700 Atherosclerosis of aorta: Secondary | ICD-10-CM | POA: Insufficient documentation

## 2017-02-10 DIAGNOSIS — I110 Hypertensive heart disease with heart failure: Secondary | ICD-10-CM | POA: Diagnosis not present

## 2017-02-10 DIAGNOSIS — Z981 Arthrodesis status: Secondary | ICD-10-CM | POA: Insufficient documentation

## 2017-02-10 DIAGNOSIS — W19XXXA Unspecified fall, initial encounter: Secondary | ICD-10-CM | POA: Diagnosis not present

## 2017-02-10 DIAGNOSIS — IMO0001 Reserved for inherently not codable concepts without codable children: Secondary | ICD-10-CM

## 2017-02-10 DIAGNOSIS — G2581 Restless legs syndrome: Secondary | ICD-10-CM | POA: Insufficient documentation

## 2017-02-10 DIAGNOSIS — M4850XA Collapsed vertebra, not elsewhere classified, site unspecified, initial encounter for fracture: Secondary | ICD-10-CM

## 2017-02-10 DIAGNOSIS — I5020 Unspecified systolic (congestive) heart failure: Secondary | ICD-10-CM | POA: Diagnosis not present

## 2017-02-10 DIAGNOSIS — Z882 Allergy status to sulfonamides status: Secondary | ICD-10-CM | POA: Insufficient documentation

## 2017-02-10 DIAGNOSIS — S32039A Unspecified fracture of third lumbar vertebra, initial encounter for closed fracture: Secondary | ICD-10-CM | POA: Insufficient documentation

## 2017-02-10 DIAGNOSIS — Z7901 Long term (current) use of anticoagulants: Secondary | ICD-10-CM | POA: Insufficient documentation

## 2017-02-10 DIAGNOSIS — G4733 Obstructive sleep apnea (adult) (pediatric): Secondary | ICD-10-CM | POA: Insufficient documentation

## 2017-02-10 DIAGNOSIS — Y92009 Unspecified place in unspecified non-institutional (private) residence as the place of occurrence of the external cause: Secondary | ICD-10-CM | POA: Diagnosis not present

## 2017-02-10 DIAGNOSIS — I481 Persistent atrial fibrillation: Secondary | ICD-10-CM | POA: Diagnosis not present

## 2017-02-10 HISTORY — PX: IR VERTEBROPLASTY LUMBAR BX INC UNI/BIL INC/INJECT/IMAGING: IMG5516

## 2017-02-10 LAB — CBC
HEMATOCRIT: 43.8 % (ref 36.0–46.0)
Hemoglobin: 14.6 g/dL (ref 12.0–15.0)
MCH: 33 pg (ref 26.0–34.0)
MCHC: 33.3 g/dL (ref 30.0–36.0)
MCV: 99.1 fL (ref 78.0–100.0)
PLATELETS: 184 10*3/uL (ref 150–400)
RBC: 4.42 MIL/uL (ref 3.87–5.11)
RDW: 15.2 % (ref 11.5–15.5)
WBC: 6.1 10*3/uL (ref 4.0–10.5)

## 2017-02-10 LAB — PROTIME-INR
INR: 1.42
PROTHROMBIN TIME: 17.5 s — AB (ref 11.4–15.2)

## 2017-02-10 LAB — APTT: aPTT: 23 seconds — ABNORMAL LOW (ref 24–36)

## 2017-02-10 MED ORDER — SODIUM CHLORIDE 0.9 % IV SOLN: INTRAVENOUS | Status: AC

## 2017-02-10 MED ORDER — TOBRAMYCIN SULFATE 1.2 G IJ SOLR
INTRAMUSCULAR | Status: AC | PRN
  Administered 2017-02-10: .1 g via TOPICAL

## 2017-02-10 MED ORDER — FENTANYL CITRATE (PF) 100 MCG/2ML IJ SOLN
INTRAMUSCULAR | Status: AC
  Filled 2017-02-10: qty 2

## 2017-02-10 MED ORDER — CEFAZOLIN SODIUM-DEXTROSE 2-4 GM/100ML-% IV SOLN
2.0000 g | Freq: Once | INTRAVENOUS | Status: AC
  Administered 2017-02-10: 2 g via INTRAVENOUS

## 2017-02-10 MED ORDER — HYDROCODONE-ACETAMINOPHEN 5-325 MG PO TABS
2.0000 | ORAL_TABLET | Freq: Once | ORAL | Status: AC
  Administered 2017-02-10: 2 via ORAL

## 2017-02-10 MED ORDER — CEFAZOLIN SODIUM-DEXTROSE 2-4 GM/100ML-% IV SOLN
INTRAVENOUS | Status: AC
  Filled 2017-02-10: qty 100

## 2017-02-10 MED ORDER — MIDAZOLAM HCL 2 MG/2ML IJ SOLN
INTRAMUSCULAR | Status: AC
  Filled 2017-02-10: qty 2

## 2017-02-10 MED ORDER — BUPIVACAINE HCL (PF) 0.25 % IJ SOLN
INTRAMUSCULAR | Status: AC
  Filled 2017-02-10: qty 30

## 2017-02-10 MED ORDER — TOBRAMYCIN SULFATE 1.2 G IJ SOLR
INTRAMUSCULAR | Status: AC
  Filled 2017-02-10: qty 1.2

## 2017-02-10 MED ORDER — IOPAMIDOL (ISOVUE-300) INJECTION 61%
INTRAVENOUS | Status: AC
  Administered 2017-02-10: 5 mL
  Filled 2017-02-10: qty 50

## 2017-02-10 MED ORDER — HYDROCODONE-ACETAMINOPHEN 10-325 MG PO TABS: 1.0000 | ORAL_TABLET | Freq: Once | ORAL | Status: DC

## 2017-02-10 MED ORDER — MIDAZOLAM HCL 2 MG/2ML IJ SOLN
INTRAMUSCULAR | Status: AC | PRN
  Administered 2017-02-10 (×2): 1 mg via INTRAVENOUS

## 2017-02-10 MED ORDER — FENTANYL CITRATE (PF) 100 MCG/2ML IJ SOLN
INTRAMUSCULAR | Status: AC | PRN
  Administered 2017-02-10 (×2): 25 ug via INTRAVENOUS

## 2017-02-10 MED ORDER — SODIUM CHLORIDE 0.9 % IV SOLN
INTRAVENOUS | Status: DC
  Administered 2017-02-10: 13:00:00 via INTRAVENOUS

## 2017-02-10 MED ORDER — HYDROCODONE-ACETAMINOPHEN 5-325 MG PO TABS
ORAL_TABLET | ORAL | Status: AC
  Administered 2017-02-10: 2 via ORAL
  Filled 2017-02-10: qty 25

## 2017-02-10 MED ORDER — BUPIVACAINE HCL (PF) 0.25 % IJ SOLN
INTRAMUSCULAR | Status: AC | PRN
  Administered 2017-02-10: 30 mL

## 2017-02-10 MED ORDER — GELATIN ABSORBABLE 12-7 MM EX MISC
CUTANEOUS | Status: AC
  Filled 2017-02-10: qty 1

## 2017-02-10 NOTE — Sedation Documentation (Signed)
Patient is resting comfortably. 

## 2017-02-10 NOTE — Discharge Instructions (Signed)
1.No stooping,bending or lifting more than 10 lbs for 2 weeks. 2.Use walker to ambulate for 2 weeks. 3.RTC in 2 weeks.   Balloon Kyphoplasty, Care After Refer to this sheet in the next few weeks. These instructions provide you with information about caring for yourself after your procedure. Your health care provider may also give you more specific instructions. Your treatment has been planned according to current medical practices, but problems sometimes occur. Call your health care provider if you have any problems or questions after your procedure. What can I expect after the procedure? After your procedure, it is common to have back pain. Follow these instructions at home: Incision care   Follow instructions from your health care provider about how to take care of your incisions. Make sure you:  Wash your hands with soap and water before you change your bandage (dressing). If soap and water are not available, use hand sanitizer.  Change your dressing as told by your health care provider.  Leave stitches (sutures), skin glue, or adhesive strips in place. These skin closures may need to be in place for 2 weeks or longer. If adhesive strip edges start to loosen and curl up, you may trim the loose edges. Do not remove adhesive strips completely unless your health care provider tells you to do that.  Check your incision area every day for signs of infection. Watch for:  Redness, swelling, or pain.  Fluid, blood, or pus.  Keep your dressing dry until your health care provider says that it can be removed. Activity    Rest your back and avoid intense physical activity for as long as told by your health care provider.  Return to your normal activities as told by your health care provider. Ask your health care provider what activities are safe for you.  Do not lift anything that is heavier than 10 lb (4.5 kg). This is about the weight of a gallon of milk.You may need to avoid heavy  lifting for several weeks. General instructions   Take over-the-counter and prescription medicines only as told by your health care provider.  If directed, apply ice to the painful area:  Put ice in a plastic bag.  Place a towel between your skin and the bag.  Leave the ice on for 20 minutes, 2-3 times per day.  Do not use tobacco products, including cigarettes, chewing tobacco, or e-cigarettes. If you need help quitting, ask your health care provider.  Keep all follow-up visits as told by your health care provider. This is important. Contact a health care provider if:  You have a fever.  You have redness, swelling, or pain at the site of your incisions.  You have fluid, blood, or pus coming from your incisions.  You have pain that gets worse or does not get better with medicine.  You develop numbness or weakness in any part of your body. Get help right away if:  You have chest pain.  You have difficulty breathing.  You cannot move your legs.  You cannot control your bladder or bowel movements.  You suddenly become weak or numb on one side of your body.  You become very confused.  You have trouble speaking or understanding, or both. This information is not intended to replace advice given to you by your health care provider. Make sure you discuss any questions you have with your health care provider. Document Released: 06/04/2015 Document Revised: 02/19/2016 Document Reviewed: 01/06/2015 Elsevier Interactive Patient Education  2017 [employer].

## 2017-02-10 NOTE — H&P (Signed)
Chief Complaint: Patient was seen in consultation today for Lumbar 3 vertebroplasty/kyphoplasty at the request of Dr Girtha Hake  Referring Physician(s): Dr Girtha Hake  Supervising Physician: Luanne Bras  Patient Status: [facility] - Out-pt  History of Present Illness: Angel Meyer is a 83 y.o. female    Pt fell at home 12/2016 Has had persistent and worsening back pain since then Pain meds without relief MRI was reviewed with Dr Estanislado Pandy at consultation 02/04/2017 Confirms Lumbar 3 acute fracture Scheduled now for vertebroplasty/kyphoplasty Note of Dr Estanislado Pandy 02/04/17- consultation: The patient's recent MRI of the lumbosacral spine was reviewed with her and her daughter. This reveals the presence of signal abnormalities at L3 suggestive of acute to subacute fracture with minimal retropulsion.  Pt returns today after first attempt at this procedure 02/08/17 Coagulation studies were elevated  Has remained off coumadin Checking new coags today   Past Medical History:  Diagnosis Date  . Arthritis   . Diastolic dysfunction    preserved EF,  CHF In setting of afib previously  . Dysrhythmia    afib  . History of blood transfusion   . Hyperlipidemia   . Hypertension   . Incontinence of urine   . OSA (obstructive sleep apnea)    uses CPAP  . Osteoporosis   . Persistent atrial fibrillation (HCC)    failed on Flecainide and Tikosyn  . Renal calculi   . Restless legs   . Rheumatic fever    age 76  . Systolic heart failure (HCC)    EF 30 to 35% per echo 08/2013    Past Surgical History:  Procedure Laterality Date  . ABDOMINAL HYSTERECTOMY    . BREAST SURGERY     benign biopsy  . CARDIOVERSION N/A 09/10/2013   Procedure: CARDIOVERSION;  Surgeon: Thompson Grayer, MD;  Location: [place];  Service: Cardiovascular;  Laterality: N/A;  . CARDIOVERSION N/A 11/16/2013   Procedure: CARDIOVERSION;  Surgeon: Sueanne Margarita, MD;  Location: [facility] ENDOSCOPY;  Service: Cardiovascular;   Laterality: N/A;  . CARDIOVERSION N/A 08/18/2015   Procedure: CARDIOVERSION;  Surgeon: Sueanne Margarita, MD;  Location: [facility] ENDOSCOPY;  Service: Cardiovascular;  Laterality: N/A;  . IR RADIOLOGIST EVAL & MGMT  02/04/2017  . [place] removed  . REPLACEMENT TOTAL KNEE  2005  . SPINE SURGERY     laminectomy 1965, spinal fusion with rod 2005  . TONSILLECTOMY      Allergies: Ciprofloxacin; Demerol [meperidine]; Sulfa antibiotics; and Sulfacetamide sodium  Medications: Prior to Admission medications   Medication Sig Start Date End Date Taking? Authorizing Provider  amiodarone (PACERONE) 200 MG tablet Take 0.5 tablets (100 mg total) by mouth daily. 12/07/16  Yes Burtis Junes, NP  donepezil (ARICEPT ODT) 10 MG disintegrating tablet Take 1 tablet (10 mg total) by mouth at bedtime. 06/29/16  Yes Burchette, Alinda Sierras, MD  escitalopram (LEXAPRO) 10 MG tablet Take 1 tablet (10 mg total) by mouth daily. 12/17/15  Yes Burchette, Alinda Sierras, MD  furosemide (LASIX) 40 MG tablet Take 1 tablet (40 mg total) by mouth daily. 12/07/16  Yes Burtis Junes, NP  HYDROcodone-acetaminophen (NORCO) 10-325 MG tablet TK 1 T PO QID PRN 01/19/17  Yes Suella Broad, MD  Melatonin 5 MG TABS Take 10 mg by mouth at bedtime.    Yes [provider]  memantine (NAMENDA) 10 MG tablet Take 1 tablet (10 mg total) by mouth 2 (two) times daily. 06/29/16  Yes Burchette, Bruce  W, MD  oxybutynin (DITROPAN-XL) 10 MG 24 hr tablet Take 10 mg by mouth daily.  05/09/15  Yes [provider]  potassium chloride SA (K-DUR,KLOR-CON) 20 MEQ tablet Take 1 tablet (20 mEq total) by mouth daily. 12/07/16  Yes Burtis Junes, NP  rOPINIRole (REQUIP) 1 MG tablet Take 1 tablet (1 mg total) by mouth See admin instructions. Take 1 tablet (1 mg) by mouth daily at 4pm and at bedtime 06/29/16  Yes Burchette, Alinda Sierras, MD  rosuvastatin (CRESTOR) 40 MG tablet Take 1 tablet (40 mg total) by mouth at bedtime. 12/07/16  Yes  Burtis Junes, NP  spironolactone (ALDACTONE) 25 MG tablet Take 1 tablet (25 mg total) by mouth daily. 12/07/16  Yes Burtis Junes, NP  trimethoprim (TRIMPEX) 100 MG tablet Take 100 mg by mouth daily.  05/18/15  Yes [provider]  clobetasol cream (TEMOVATE) 2.69 % Apply 1 application topically 2 (two) times daily as needed. For lichens simplex chronicus 08/13/15   [provider]  diltiazem (CARDIZEM) 30 MG tablet Take 1 tablet (30 mg total) by mouth every 6 (six) hours as needed (AFIB w/pulse >100 bpm until resolved). 12/07/16   Burtis Junes, NP  fluocinonide cream (LIDEX) 4.85 % Apply 1 application topically 2 (two) times daily as needed (CHRONIC RASH).    [provider]  Misc. Devices (ROLLER [place]) MISC Used as Directed. 01/12/16   Burchette, Alinda Sierras, MD  nitroGLYCERIN (NITROSTAT) 0.4 MG SL tablet Place 1 tablet (0.4 mg total) under the tongue every 5 (five) minutes as needed for chest pain. 12/07/16   Burtis Junes, NP  warfarin (COUMADIN) 2.5 MG tablet Take as directed by anticoagulation clinic. Patient taking differently: Take 2.5 mg by mouth every evening. Take as directed by anticoagulation clinic. 01/25/17   Burchette, Alinda Sierras, MD     Family History  Problem Relation Age of Onset  . Cancer Mother        breast  . Diabetes Mother   . Cancer Paternal Grandfather     Social History   Social History  . Marital status: Divorced    Spouse name: N/A  . Number of children: N/A  . Years of education: N/A   Social History Main Topics  . Smoking status: Never Smoker  . Smokeless tobacco: Never Used  . Alcohol use Yes     Comment: glass of wine each day  . Drug use: No  . Sexual activity: Not Currently   Other Topics Concern  . None   Social History Narrative   Lives in [place] alone.  Spends 4-6 months per year in [place] with her daughter.   Retired Network engineer    Review of Systems: A 12 point ROS discussed and pertinent positives are  indicated in the HPI above.  All other systems are negative.  Review of Systems  Constitutional: Positive for activity change, appetite change and fatigue. Negative for fever.  Respiratory: Negative for cough and shortness of breath.   Cardiovascular: Negative for chest pain.  Gastrointestinal: Negative for abdominal pain.  Musculoskeletal: Positive for back pain and gait problem.  Neurological: Positive for weakness.  Psychiatric/Behavioral: Negative for behavioral problems and confusion.    Vital Signs: BP (!) 144/103   Pulse (!) 102   Temp 97.7 F (36.5 C)   Resp 18   Ht 5' (1.524 m)   Wt 200 lb (90.7 kg)   SpO2 96%   BMI 39.06 kg/m   Physical Exam  Constitutional:  She is oriented to person, place, and time.  Cardiovascular: Regular rhythm.   Irregular rate  Pulmonary/Chest: Effort normal and breath sounds normal.  Abdominal: Soft. Bowel sounds are normal.  Musculoskeletal: Normal range of motion.  Low back pain  Neurological: She is alert and oriented to person, place, and time.  Skin: Skin is warm and dry.  Psychiatric: She has a normal mood and affect. Her behavior is normal. Judgment and thought content normal.  Nursing note and vitals reviewed.   Mallampati Score:  MD Evaluation Airway: WNL Heart: WNL Abdomen: WNL Chest/ Lungs: WNL ASA  Classification: 3 Mallampati/Airway Score: Two  Imaging: Dg Lumbar Spine Complete  Result Date: 01/12/2017 CLINICAL DATA:  Low back pain.  History of laminectomy and fusion EXAM: LUMBAR SPINE - COMPLETE 4+ VIEW COMPARISON:  Chest two-view 08/13/2015 FINDINGS: Bilateral pedicle screw fusion at L1-2. Moderate compression fracture of L1 is probably present on the prior chest x-ray and probably is chronic. Mild endplate deformities involving the superior and inferior plates of L3 in the superior endplate of L4 appear chronic. Moderate disc degeneration at L4-5 and L5-S1. Arterial calcification IMPRESSION: Pedicle screw fusion at  L1-2. Moderate compression fracture L1 is probably chronic. Mild endplate depressions at L3 and L4 probably chronic. Comparison with prior lumbar spine x-rays or cross-sectional imaging is suggested if available. Atherosclerotic aorta. Electronically Signed   By: Franchot Gallo M.D.   On: 01/12/2017 16:31   Ir Radiologist Eval & Mgmt  Result Date: 02/07/2017 EXAM: NEW PATIENT OFFICE VISIT CHIEF COMPLAINT: Severe low back pain secondary to compression fracture at L3. Current Pain Level: 1-10 HISTORY OF PRESENT ILLNESS: The patient is an 83 year old right-handed lady referred for evaluation of pain relief due to compression fracture at L3. The patient is accompanied by her daughter. The patient reports progressive worsening of the low back pain following a fall in April of 2018. The pain apparently has been worsening over this period of time to where it has been so severe that the patient has been crying due to severe pain. She describes this pain as being a 10 out of 10 without pain medications. With the pain medications it is a 7 out of 10. It has left her essentially immobile not able to perform her daily activities at home. She describes the pain worse with turning or stooping or coughing. There is intermittent numbness in the toes of the left foot. The patient had a limp gait even prior to her developing these fractures as per her daughter. She denies any autonomic dysfunction of her bowel or bladder activities. However, does report urgency incontinence which is a long-term symptom. Her appetite has slightly improved over the past few days. She denies any symptoms of dysuria, hematuria or of foul-smelling urine. She denies any chest pain, shortness of breath, ankle edema, coughing, wheezing or hemoptysis. Denies any constipation, diarrhea, difficulty swallowing liquids or solids, or bloody stools. Past Medical History: Arthritis, depression, high blood pressure, high cholesterol, heart problems. The patient  has had history of transient atrial fibrillation for which she has been taking Coumadin. Previous surgical history of back surgeries x2, left knee replacement, right shoulder surgery, hysterectomy, kidney stone removal. Medications: Warfarin, trimethoprim, ropinirole, spironolactone, rosuvastatin, potassium chloride, oxybutynin, Nitrostat, melatonin, hydrochlorothiazide acetaminophen, furosemide, flucinonide, escitalopram, donepezil, diltiazem, clobetasol, amiodarone, memantine. Allergies: Sulfa antibiotics, Demerol, Cipro all of which cause generalized itching and rash. Social History: The patient lives with her daughter. She denies smoking cigarettes or drinking alcohol or using illicit chemicals. Patient is  not on any special diet. Family History: Mother deceased age 45 of breast carcinoma. Father deceased age 63 of old age. REVIEW OF SYSTEMS: Negative unless as mentioned above. PHYSICAL EXAMINATION: In obvious acute distress on account of her severe pain. Patient unable to lay still because of the pain in her back. Affect appropriate for situation. Neurologically, otherwise, alert, awake and oriented to time, place, space. No lateralizing cranial nerve abnormalities, motor, sensory or coordination difficulties. Station and gait not tested. ASSESSMENT AND PLAN: The patient's recent MRI of the lumbosacral spine was reviewed with her and her daughter. This reveals the presence of signal abnormalities at L3 suggestive of acute to subacute fracture with minimal retropulsion. Also demonstrated are old fractures at T12-L1 and postsurgical fusion changes at L1-L2 with laminectomy defects extending from L1-L2 to L4-L5. The option of vertebral body augmentation at L3, to alleviate pain, and also to prevent further collapse of vertebral body was reviewed with the patient and patient's daughter. The procedure, the risks, the benefits and the alternatives were all reviewed in detail. Questions were answered to their  satisfaction. The patient would like to proceed with the vertebral body augmentation at L3. This could be either a balloon kyphoplasty or vertebroplasty depending on the anatomy at the time of the procedure. The patient has already been off Coumadin for 2 days. She was asked to refrain from taking any more Coumadin until after her procedure which has been scheduled for Tuesday. The patient will have a PT and an INR performed on Tuesday. Should this be within an acceptable range, the procedure will be performed. In the meantime, the patient has been bridged with aspirin 81 mg a day to be taken daily until after the procedure. The patient reports no side effects such as bleeding ulcers or severe nausea or of an allergic reactions to aspirin. Both the daughter and the patient leave with good understanding and agreement with the above management plan. They were asked to call should they have any concerns or questions. Electronically Signed   By: Luanne Bras M.D.   On: 02/04/2017 14:24    Labs:  CBC:  Recent Labs  06/07/16 1007 12/07/16 1507 02/08/17 1224 02/10/17 1209  WBC 5.8 6.3 5.2 6.1  HGB 15.2  --  14.3 14.6  HCT 46.2* 45.3 43.8 43.8  PLT 149 179 175 184    COAGS:  Recent Labs  12/08/16 12/22/16 01/12/17 1337 02/08/17 1224  INR 1.4 2.3 2.6 1.42    BMP:  Recent Labs  06/07/16 1007 12/07/16 1507 02/08/17 1224  NA 142 138 137  K 3.7 4.0 4.3  CL 106 101 105  CO2 25 23 24   GLUCOSE 85 99 92  BUN 18 30* 18  CALCIUM 9.4 9.5 9.2  CREATININE 1.03* 1.21* 1.21*  GFRNONAA  --  42* 41*  GFRAA  --  48* 47*    LIVER FUNCTION TESTS:  Recent Labs  06/07/16 1007 12/07/16 1507  BILITOT 1.3* 1.1  AST 24 18  ALT 21 15  ALKPHOS 59 66  PROT 6.2 6.4  ALBUMIN 4.1 4.1    TUMOR MARKERS: No results for input(s): AFPTM, CEA, CA199, CHROMGRNA in the last 8760 hours.  Assessment and Plan:  Lumbar 3 painful fracture Scheduled now for VP/KP Risks and Benefits discussed with  the patient including, but not limited to education regarding the natural healing process of compression fractures without intervention, bleeding, infection, cement migration which may cause spinal cord damage, paralysis, pulmonary embolism or even death.  All of the patient's questions were answered, patient is agreeable to proceed. Consent signed and in chart.  Thank you for this interesting consult.  I greatly enjoyed meeting Angel Meyer and look forward to participating in their care.  A copy of this report was sent to the requesting provider on this date.  Electronically Signed: Lavonia Drafts, PA-C 02/10/2017, 1:29 PM   I spent a total of    25 Minutes in face to face in clinical consultation, greater than 50% of which was counseling/coordinating care for L3 VP/KP

## 2017-02-10 NOTE — Progress Notes (Signed)
Patient ID: Angel Meyer, female   DOB: Nov 06, 1932, 83 y.o.   MRN: [id number] INR. Remains in severe pain. Repeat  PT and INR remain unchanged from yesterday. INR 1.42. PT 17.5. Reviewed with patient and daughter. Because of normal INR  And severe pain will proceed with vertebroplasty.  Patient  and daughter agreeable.Radene Gunning MD

## 2017-02-10 NOTE — Procedures (Signed)
S/P L3 VP  

## 2017-02-11 ENCOUNTER — Encounter (HOSPITAL_COMMUNITY): Payer: Self-pay | Admitting: Interventional Radiology

## 2017-02-14 ENCOUNTER — Telehealth (HOSPITAL_COMMUNITY): Payer: Self-pay

## 2017-02-14 ENCOUNTER — Ambulatory Visit (INDEPENDENT_AMBULATORY_CARE_PROVIDER_SITE_OTHER): Payer: Medicare Other | Admitting: General Practice

## 2017-02-14 DIAGNOSIS — Z5181 Encounter for therapeutic drug level monitoring: Secondary | ICD-10-CM

## 2017-02-14 DIAGNOSIS — I4891 Unspecified atrial fibrillation: Secondary | ICD-10-CM

## 2017-02-14 LAB — POCT INR: INR: 2.7

## 2017-02-14 NOTE — Telephone Encounter (Signed)
Called to schedule f/u, left message for pt to return call. AW 

## 2017-02-14 NOTE — Patient Instructions (Signed)
Pre visit review using our clinic review tool, if applicable. No additional management support is needed unless otherwise documented below in the visit note. 

## 2017-02-28 ENCOUNTER — Telehealth (HOSPITAL_COMMUNITY): Payer: Self-pay

## 2017-02-28 NOTE — Telephone Encounter (Signed)
Called to schedule 2 wk f/u, left message for pt to return call. AW 

## 2017-03-01 ENCOUNTER — Ambulatory Visit (HOSPITAL_COMMUNITY)
Admission: RE | Admit: 2017-03-01 | Discharge: 2017-03-01 | Disposition: A | Discharge status: Home / Self Care | Payer: Medicare Other | Source: Ambulatory Visit | Attending: Nurse Practitioner | Admitting: Nurse Practitioner

## 2017-03-01 VITALS — BP 110/62 | HR 135 | Ht 60.0 in | Wt 190.6 lb

## 2017-03-01 DIAGNOSIS — G4733 Obstructive sleep apnea (adult) (pediatric): Secondary | ICD-10-CM | POA: Diagnosis not present

## 2017-03-01 DIAGNOSIS — I481 Persistent atrial fibrillation: Secondary | ICD-10-CM | POA: Diagnosis not present

## 2017-03-01 DIAGNOSIS — I4819 Other persistent atrial fibrillation: Secondary | ICD-10-CM

## 2017-03-01 DIAGNOSIS — Z79899 Other long term (current) drug therapy: Secondary | ICD-10-CM | POA: Diagnosis not present

## 2017-03-01 DIAGNOSIS — I480 Paroxysmal atrial fibrillation: Secondary | ICD-10-CM | POA: Insufficient documentation

## 2017-03-01 DIAGNOSIS — Z7901 Long term (current) use of anticoagulants: Secondary | ICD-10-CM | POA: Diagnosis not present

## 2017-03-01 DIAGNOSIS — I5020 Unspecified systolic (congestive) heart failure: Secondary | ICD-10-CM | POA: Diagnosis not present

## 2017-03-01 DIAGNOSIS — I4891 Unspecified atrial fibrillation: Secondary | ICD-10-CM | POA: Diagnosis present

## 2017-03-01 DIAGNOSIS — R9431 Abnormal electrocardiogram [ECG] [EKG]: Secondary | ICD-10-CM | POA: Insufficient documentation

## 2017-03-01 DIAGNOSIS — E785 Hyperlipidemia, unspecified: Secondary | ICD-10-CM | POA: Diagnosis not present

## 2017-03-01 DIAGNOSIS — I110 Hypertensive heart disease with heart failure: Secondary | ICD-10-CM | POA: Diagnosis not present

## 2017-03-01 LAB — PROTIME-INR
INR: 2.9
PROTHROMBIN TIME: 31 s — AB (ref 11.4–15.2)

## 2017-03-01 LAB — CBC
HCT: 45.2 % (ref 36.0–46.0)
Hemoglobin: 15.5 g/dL — ABNORMAL HIGH (ref 12.0–15.0)
MCH: 34.1 pg — ABNORMAL HIGH (ref 26.0–34.0)
MCHC: 34.3 g/dL (ref 30.0–36.0)
MCV: 99.6 fL (ref 78.0–100.0)
PLATELETS: 157 10*3/uL (ref 150–400)
RBC: 4.54 MIL/uL (ref 3.87–5.11)
RDW: 15.4 % (ref 11.5–15.5)
WBC: 6.7 10*3/uL (ref 4.0–10.5)

## 2017-03-01 LAB — BASIC METABOLIC PANEL
ANION GAP: 10 (ref 5–15)
BUN: 15 mg/dL (ref 6–20)
CO2: 20 mmol/L — ABNORMAL LOW (ref 22–32)
Calcium: 9.4 mg/dL (ref 8.9–10.3)
Chloride: 108 mmol/L (ref 101–111)
Creatinine, Ser: 1.21 mg/dL — ABNORMAL HIGH (ref 0.44–1.00)
GFR calc Af Amer: 47 mL/min — ABNORMAL LOW (ref >60)
GFR, EST NON AFRICAN AMERICAN: 41 mL/min — AB (ref >60)
GLUCOSE: 106 mg/dL — AB (ref 65–99)
POTASSIUM: 4.4 mmol/L (ref 3.5–5.1)
Sodium: 138 mmol/L (ref 135–145)

## 2017-03-01 MED ORDER — AMIODARONE HCL 200 MG PO TABS: 200.0000 mg | ORAL_TABLET | Freq: Every day | ORAL | 3 refills | Status: DC

## 2017-03-01 NOTE — Patient Instructions (Addendum)
Your physician has recommended you make the following change in your medication:  1)Increase Amiodarone to 200mg  twice a day -- after cardioversion decrease amiodarone to 200mg  daily  Cardioversion scheduled for Monday, June 11th  - Arrive at the [facility] and go to admitting at 8:30am  -Do not eat or drink anything after midnight the night prior to your procedure.  - Take all your medication with a sip of water prior to arrival.  - You will not be able to drive home after your procedure.

## 2017-03-02 ENCOUNTER — Telehealth: Payer: Self-pay | Admitting: General Practice

## 2017-03-02 NOTE — Telephone Encounter (Signed)
I spoke with [name], patient's daughter.  I instructed her to keep patient's coumadin dosage as is for now in  preperation for cardioversion on Monday, 6/11.

## 2017-03-02 NOTE — Addendum Note (Signed)
Encounter addended by: Sherran Needs, NP on: 03/02/2017  8:55 AM<BR>    Actions taken: LOS modified

## 2017-03-02 NOTE — Progress Notes (Signed)
Primary Care Physician: [name], MD Referring Physician: Dr. Drucie Ip Angel Meyer is a 83 y.o. female with a h/o afib on amiodarone that for most part stays in SR but occasionally will need cardioversion. Last one in 2016.She is on amiodarone at 100 mg a day.She had a back surgery two weeks ago receiving cement in the back. After that she starting feeling fatigued and the daughter noted increase in heart rate. She was off warfarin from 5/10 to 5/17 for back surgery.Ekg shows afib at 135 bpm.   Today, she denies symptoms of palpitations, chest pain, shortness of breath, orthopnea, PND, lower extremity edema, dizziness, presyncope, syncope, or neurologic sequela. The patient is tolerating medications without difficulties and is otherwise without complaint today.   Past Medical History:  Diagnosis Date  . Arthritis   . Diastolic dysfunction    preserved EF,  CHF In setting of afib previously  . Dysrhythmia    afib  . History of blood transfusion   . Hyperlipidemia   . Hypertension   . Incontinence of urine   . OSA (obstructive sleep apnea)    uses CPAP  . Osteoporosis   . Persistent atrial fibrillation (HCC)    failed on Flecainide and Tikosyn  . Renal calculi   . Restless legs   . Rheumatic fever    age 61  . Systolic heart failure (HCC)    EF 30 to 35% per echo 08/2013   Past Surgical History:  Procedure Laterality Date  . ABDOMINAL HYSTERECTOMY    . BREAST SURGERY     benign biopsy  . CARDIOVERSION N/A 09/10/2013   Procedure: CARDIOVERSION;  Surgeon: Thompson Grayer, MD;  Location: [place];  Service: Cardiovascular;  Laterality: N/A;  . CARDIOVERSION N/A 11/16/2013   Procedure: CARDIOVERSION;  Surgeon: Sueanne Margarita, MD;  Location: [facility] ENDOSCOPY;  Service: Cardiovascular;  Laterality: N/A;  . CARDIOVERSION N/A 08/18/2015   Procedure: CARDIOVERSION;  Surgeon: Sueanne Margarita, MD;  Location: [facility] ENDOSCOPY;  Service: Cardiovascular;  Laterality: N/A;  . IR RADIOLOGIST  EVAL & MGMT  02/04/2017  . IR VERTEBROPLASTY LUMBAR BX INC UNI/BIL INC/INJECT/IMAGING  02/10/2017  . [place] removed  . REPLACEMENT TOTAL KNEE  2005  . SPINE SURGERY     laminectomy 1965, spinal fusion with rod 2005  . TONSILLECTOMY      Current Outpatient Prescriptions  Medication Sig Dispense Refill  . amiodarone (PACERONE) 200 MG tablet Take 1 tablet (200 mg total) by mouth daily. 45 tablet 3  . diltiazem (CARDIZEM) 30 MG tablet Take 1 tablet (30 mg total) by mouth every 6 (six) hours as needed (AFIB w/pulse >100 bpm until resolved). 30 tablet 3  . donepezil (ARICEPT ODT) 10 MG disintegrating tablet Take 1 tablet (10 mg total) by mouth at bedtime. 90 tablet 2  . escitalopram (LEXAPRO) 10 MG tablet Take 1 tablet (10 mg total) by mouth daily. 90 tablet 3  . furosemide (LASIX) 40 MG tablet Take 1 tablet (40 mg total) by mouth daily. 90 tablet 3  . Melatonin 5 MG TABS Take 10 mg by mouth at bedtime.     . memantine (NAMENDA) 10 MG tablet Take 1 tablet (10 mg total) by mouth 2 (two) times daily. 180 tablet 2  . oxybutynin (DITROPAN-XL) 10 MG 24 hr tablet Take 10 mg by mouth daily.   11  . potassium chloride SA (K-DUR,KLOR-CON) 20 MEQ tablet Take 1 tablet (20 mEq total) by  mouth daily. 90 tablet 3  . rOPINIRole (REQUIP) 1 MG tablet Take 1 tablet (1 mg total) by mouth See admin instructions. Take 1 tablet (1 mg) by mouth daily at 4pm and at bedtime (Patient taking differently: Take 1 mg by mouth at bedtime. Take 1 tablet (1 mg) by mouth daily at 4pm and at bedtime) 180 tablet 2  . rosuvastatin (CRESTOR) 40 MG tablet Take 1 tablet (40 mg total) by mouth at bedtime. 90 tablet 3  . spironolactone (ALDACTONE) 25 MG tablet Take 1 tablet (25 mg total) by mouth daily. 90 tablet 3  . trimethoprim (TRIMPEX) 100 MG tablet Take 100 mg by mouth daily.   11  . warfarin (COUMADIN) 2.5 MG tablet Take as directed by anticoagulation clinic. (Patient taking differently: Take 2.5 mg by mouth  every evening. Take as directed by anticoagulation clinic.) 105 tablet 1  . clobetasol cream (TEMOVATE) 1.24 % Apply 1 application topically 2 (two) times daily as needed. For lichens simplex chronicus    . fluocinonide cream (LIDEX) 5.80 % Apply 1 application topically 2 (two) times daily as needed (CHRONIC RASH).    . Misc. Devices (ROLLER [place]) MISC Used as Directed. 1 each 0  . nitroGLYCERIN (NITROSTAT) 0.4 MG SL tablet Place 1 tablet (0.4 mg total) under the tongue every 5 (five) minutes as needed for chest pain. 25 tablet 3   No current facility-administered medications for this encounter.     Allergies  Allergen Reactions  . Ciprofloxacin Nausea And Vomiting  . Demerol [Meperidine] Nausea And Vomiting  . Sulfa Antibiotics Nausea And Vomiting    And all derivatives -  GI Bleeding  . Sulfacetamide Sodium Nausea And Vomiting    Social History   Social History  . Marital status: Divorced    Spouse name: N/A  . Number of children: N/A  . Years of education: N/A   Occupational History  . Not on file.   Social History Main Topics  . Smoking status: Never Smoker  . Smokeless tobacco: Never Used  . Alcohol use Yes     Comment: glass of wine each day  . Drug use: No  . Sexual activity: Not Currently   Other Topics Concern  . Not on file   Social History Narrative   Lives in [place] alone.  Spends 4-6 months per year in [place] with her daughter.   Retired Network engineer    Family History  Problem Relation Age of Onset  . Cancer Mother        breast  . Diabetes Mother   . Cancer Paternal Grandfather     ROS- All systems are reviewed and negative except as per the HPI above  Physical Exam: Vitals:   03/01/17 1413  BP: 110/62  Pulse: (!) 135  Weight: 190 lb 9.6 oz (86.5 kg)  Height: 5' (1.524 m)   Wt Readings from Last 3 Encounters:  03/01/17 190 lb 9.6 oz (86.5 kg)  02/10/17 200 lb (90.7 kg)  02/08/17 200 lb (90.7 kg)    Labs: Lab Results  Component  Value Date   NA 138 03/01/2017   K 4.4 03/01/2017   CL 108 03/01/2017   CO2 20 (L) 03/01/2017   GLUCOSE 106 (H) 03/01/2017   BUN 15 03/01/2017   CREATININE 1.21 (H) 03/01/2017   CALCIUM 9.4 03/01/2017   MG 2.3 09/14/2013   Lab Results  Component Value Date   INR 2.90 03/01/2017   Lab Results  Component Value Date   CHOL  215 (H) 12/07/2016   HDL 78 12/07/2016   LDLCALC 111 (H) 12/07/2016   TRIG 130 12/07/2016     GEN- The patient is well appearing, alert and oriented x 3 today.   Head- normocephalic, atraumatic Eyes-  Sclera clear, conjunctiva pink Ears- hearing intact Oropharynx- clear Neck- supple, no JVP Lymph- no cervical lymphadenopathy Lungs- Clear to ausculation bilaterally, normal work of breathing Heart- Rapid irregular rate and rhythm, no murmurs, rubs or gallops, PMI not laterally displaced GI- soft, NT, ND, + BS Extremities- no clubbing, cyanosis, or edema MS- no significant deformity or atrophy Skin- no rash or lesion Psych- euthymic mood, full affect Neuro- strength and sensation are intact  EKG-afib at 135 bpm, qrs int 78 ms, , qtc 516 ms Epic records reviewed    Assessment and Plan: 1. Paroxysmal Afib Encouraged to use prn Cardizem to keep HR down if HR is over 100 and sys BP is over 100. Will increase amiodarone to 200 mg bid in preparation for cardioversion, reduce amiodarone to 200 mg a day following cardioversion Plan on cardioversion 6/11 with TEE since coumadin was recently stopped x one week and 4 weekly INR's not in place INR today with cbc, bmet and INR again day of cardioversion. Also keep INR appointment with PCP for INR to be checked again 6/19 with fluctuation in amiodarrone  See one week f/u cardioversion  Angel Meyer, [facility] [address] [place], [place] [address] [phone]

## 2017-03-03 ENCOUNTER — Telehealth (HOSPITAL_COMMUNITY): Payer: Self-pay

## 2017-03-03 NOTE — Telephone Encounter (Signed)
Called to schedule f/u, pt is doing better. Will call if things change. AW

## 2017-03-07 ENCOUNTER — Encounter (HOSPITAL_COMMUNITY)
Admission: RE | Disposition: A | Discharge status: Home / Self Care | Payer: Self-pay | Source: Ambulatory Visit | Attending: Cardiology

## 2017-03-07 ENCOUNTER — Ambulatory Visit (HOSPITAL_BASED_OUTPATIENT_CLINIC_OR_DEPARTMENT_OTHER): Payer: Medicare Other

## 2017-03-07 ENCOUNTER — Encounter (HOSPITAL_COMMUNITY): Payer: Self-pay | Admitting: *Deleted

## 2017-03-07 ENCOUNTER — Ambulatory Visit (HOSPITAL_COMMUNITY): Payer: Medicare Other | Admitting: Certified Registered Nurse Anesthetist

## 2017-03-07 ENCOUNTER — Ambulatory Visit (HOSPITAL_COMMUNITY)
Admission: RE | Admit: 2017-03-07 | Discharge: 2017-03-07 | Disposition: A | Discharge status: Home / Self Care | Payer: Medicare Other | Source: Ambulatory Visit | Attending: Cardiology | Admitting: Cardiology

## 2017-03-07 ENCOUNTER — Ambulatory Visit (INDEPENDENT_AMBULATORY_CARE_PROVIDER_SITE_OTHER): Payer: Medicare Other | Admitting: General Practice

## 2017-03-07 DIAGNOSIS — Z96659 Presence of unspecified artificial knee joint: Secondary | ICD-10-CM | POA: Insufficient documentation

## 2017-03-07 DIAGNOSIS — I5020 Unspecified systolic (congestive) heart failure: Secondary | ICD-10-CM | POA: Diagnosis not present

## 2017-03-07 DIAGNOSIS — Z9989 Dependence on other enabling machines and devices: Secondary | ICD-10-CM | POA: Diagnosis not present

## 2017-03-07 DIAGNOSIS — Z881 Allergy status to other antibiotic agents status: Secondary | ICD-10-CM | POA: Insufficient documentation

## 2017-03-07 DIAGNOSIS — I110 Hypertensive heart disease with heart failure: Secondary | ICD-10-CM | POA: Diagnosis not present

## 2017-03-07 DIAGNOSIS — M1990 Unspecified osteoarthritis, unspecified site: Secondary | ICD-10-CM | POA: Diagnosis not present

## 2017-03-07 DIAGNOSIS — Z90710 Acquired absence of both cervix and uterus: Secondary | ICD-10-CM | POA: Diagnosis not present

## 2017-03-07 DIAGNOSIS — Z803 Family history of malignant neoplasm of breast: Secondary | ICD-10-CM | POA: Diagnosis not present

## 2017-03-07 DIAGNOSIS — G4733 Obstructive sleep apnea (adult) (pediatric): Secondary | ICD-10-CM | POA: Insufficient documentation

## 2017-03-07 DIAGNOSIS — I4891 Unspecified atrial fibrillation: Secondary | ICD-10-CM | POA: Diagnosis not present

## 2017-03-07 DIAGNOSIS — G2581 Restless legs syndrome: Secondary | ICD-10-CM | POA: Diagnosis not present

## 2017-03-07 DIAGNOSIS — Z882 Allergy status to sulfonamides status: Secondary | ICD-10-CM | POA: Insufficient documentation

## 2017-03-07 DIAGNOSIS — Z7901 Long term (current) use of anticoagulants: Secondary | ICD-10-CM | POA: Insufficient documentation

## 2017-03-07 DIAGNOSIS — I700 Atherosclerosis of aorta: Secondary | ICD-10-CM | POA: Diagnosis not present

## 2017-03-07 DIAGNOSIS — M810 Age-related osteoporosis without current pathological fracture: Secondary | ICD-10-CM | POA: Insufficient documentation

## 2017-03-07 DIAGNOSIS — I480 Paroxysmal atrial fibrillation: Secondary | ICD-10-CM | POA: Diagnosis not present

## 2017-03-07 DIAGNOSIS — Z5181 Encounter for therapeutic drug level monitoring: Secondary | ICD-10-CM

## 2017-03-07 DIAGNOSIS — I340 Nonrheumatic mitral (valve) insufficiency: Secondary | ICD-10-CM

## 2017-03-07 DIAGNOSIS — Z79899 Other long term (current) drug therapy: Secondary | ICD-10-CM | POA: Insufficient documentation

## 2017-03-07 DIAGNOSIS — Z885 Allergy status to narcotic agent status: Secondary | ICD-10-CM | POA: Insufficient documentation

## 2017-03-07 DIAGNOSIS — R05 Cough: Secondary | ICD-10-CM | POA: Diagnosis not present

## 2017-03-07 DIAGNOSIS — I481 Persistent atrial fibrillation: Secondary | ICD-10-CM | POA: Diagnosis not present

## 2017-03-07 DIAGNOSIS — E785 Hyperlipidemia, unspecified: Secondary | ICD-10-CM | POA: Insufficient documentation

## 2017-03-07 DIAGNOSIS — L280 Lichen simplex chronicus: Secondary | ICD-10-CM | POA: Insufficient documentation

## 2017-03-07 HISTORY — PX: CARDIOVERSION: SHX1299

## 2017-03-07 HISTORY — PX: TEE WITHOUT CARDIOVERSION: SHX5443

## 2017-03-07 LAB — PROTIME-INR
INR: 3.26
PROTHROMBIN TIME: 34 s — AB (ref 11.4–15.2)

## 2017-03-07 SURGERY — ECHOCARDIOGRAM, TRANSESOPHAGEAL
Anesthesia: General

## 2017-03-07 MED ORDER — LIDOCAINE 2% (20 MG/ML) 5 ML SYRINGE
INTRAMUSCULAR | Status: DC | PRN
  Administered 2017-03-07: 40 mg via INTRAVENOUS

## 2017-03-07 MED ORDER — BUTAMBEN-TETRACAINE-BENZOCAINE 2-2-14 % EX AERO
INHALATION_SPRAY | CUTANEOUS | Status: DC | PRN
  Administered 2017-03-07: 1 via TOPICAL

## 2017-03-07 MED ORDER — SODIUM CHLORIDE 0.9 % IV SOLN
INTRAVENOUS | Status: DC
  Administered 2017-03-07: 11:00:00 via INTRAVENOUS
  Administered 2017-03-07: 500 mL via INTRAVENOUS

## 2017-03-07 MED ORDER — PROPOFOL 500 MG/50ML IV EMUL
INTRAVENOUS | Status: DC | PRN
  Administered 2017-03-07: 50 ug/kg/min via INTRAVENOUS

## 2017-03-07 MED ORDER — PROPOFOL 10 MG/ML IV BOLUS
INTRAVENOUS | Status: DC | PRN
  Administered 2017-03-07 (×2): 20 mg via INTRAVENOUS

## 2017-03-07 NOTE — Anesthesia Postprocedure Evaluation (Signed)
Anesthesia Post Note  Patient: Angel Meyer  Procedure(s) Performed: Procedure(s) (LRB): TRANSESOPHAGEAL ECHOCARDIOGRAM (TEE) (N/A) CARDIOVERSION (N/A)     Patient location during evaluation: PACU Anesthesia Type: General Level of consciousness: awake and alert Pain management: pain level controlled Vital Signs Assessment: post-procedure vital signs reviewed and stable Respiratory status: spontaneous breathing, nonlabored ventilation and respiratory function stable Cardiovascular status: blood pressure returned to baseline and stable Postop Assessment: no signs of nausea or vomiting Anesthetic complications: no    Last Vitals:  Vitals:   03/07/17 1106 03/07/17 1120  BP: 117/61 111/66  Pulse: 62 96  Resp: 14   Temp: 36.9 C     Last Pain:  Vitals:   03/07/17 1106  TempSrc: Oral                 Ger Ringenberg,W. EDMOND

## 2017-03-07 NOTE — Interval H&P Note (Signed)
History and Physical Interval Note:  03/07/2017 8:42 AM  [name]  has presented today for surgery, with the diagnosis of AFIB  The various methods of treatment have been discussed with the patient and family. After consideration of risks, benefits and other options for treatment, the patient has consented to  Procedure(s): TRANSESOPHAGEAL ECHOCARDIOGRAM (TEE) (N/A) CARDIOVERSION (N/A) as a surgical intervention .  The patient's history has been reviewed, patient examined, no change in status, stable for surgery.  I have reviewed the patient's chart and labs.  Questions were answered to the patient's satisfaction.     Ena Dawley

## 2017-03-07 NOTE — Patient Instructions (Signed)
Pre visit review using our clinic review tool, if applicable. No additional management support is needed unless otherwise documented below in the visit note. 

## 2017-03-07 NOTE — CV Procedure (Signed)
     Transesophageal Echocardiogram Note  Angel Meyer [id number] Nov 16, 1932  Procedure: Transesophageal Echocardiogram Indications: atrial fibrillation  Procedure Details Consent: Obtained Time Out: Verified patient identification, verified procedure, site/side was marked, verified correct patient position, special equipment/implants available, Radiology Safety Procedures followed,  medications/allergies/relevent history reviewed, required imaging and test results available.  Performed  Medications: Iv propofol administered by anesthesia staff  Left Ventrical:  LVEF 40-45%, diffuse hypokinesis  Mitral Valve: moderate MR  Aortic Valve: mildly thickened and calcified, trivial AI, no AS  Tricuspid Valve: mild TR  Pulmonic Valve: no PR  Left Atrium/ Left atrial appendage: dilated left atrium, normal emptying and filling velocities, no thrombus  Atrial septum: no ASD or PFO by color Doppler  Aorta: moderate non-mobile plaque   Complications: No apparent complications Patient did tolerate procedure well.  Ena Dawley, MD, [facility] 03/07/2017, 11:07 AM   Cardioversion Note  Angel Meyer [id number] July 28, 1933  Procedure: DC Cardioversion Indications: atrial fibrillation  Procedure Details Consent: Obtained Time Out: Verified patient identification, verified procedure, site/side was marked, verified correct patient position, special equipment/implants available, Radiology Safety Procedures followed,  medications/allergies/relevent history reviewed, required imaging and test results available.  Performed  The patient has been on adequate anticoagulation.  The patient received IV Propofol administered by anesthesia staff for sedation.  Synchronous cardioversion was performed at 200 joules.  The cardioversion was successful.    Complications: No apparent complications Patient did tolerate procedure well.   Ena Dawley, MD, [facility] 03/07/2017, 11:07 AM

## 2017-03-07 NOTE — Anesthesia Preprocedure Evaluation (Addendum)
Anesthesia Evaluation  Patient identified by MRN, date of birth, ID band Patient awake    Reviewed: Allergy & Precautions, H&P , NPO status , Patient's Chart, lab work & pertinent test results  Airway Mallampati: II  TM Distance: >3 FB Neck ROM: Full    Dental no notable dental hx. (+) Teeth Intact, Dental Advisory Given   Pulmonary sleep apnea and Continuous Positive Airway Pressure Ventilation ,    Pulmonary exam normal breath sounds clear to auscultation       Cardiovascular hypertension, Pt. on medications +CHF  + dysrhythmias Atrial Fibrillation  Rhythm:Irregular Rate:Normal     Neuro/Psych negative neurological ROS  negative psych ROS   GI/Hepatic negative GI ROS, Neg liver ROS,   Endo/Other  negative endocrine ROS  Renal/GU negative Renal ROS  negative genitourinary   Musculoskeletal  (+) Arthritis , Osteoarthritis,    Abdominal   Peds  Hematology negative hematology ROS (+)   Anesthesia Other Findings   Reproductive/Obstetrics negative OB ROS                            Anesthesia Physical Anesthesia Plan  ASA: III  Anesthesia Plan: General   Post-op Pain Management:    Induction: Intravenous  PONV Risk Score and Plan: 3 and Propofol and Treatment may vary due to age or medical condition  Airway Management Planned: Nasal Cannula  Additional Equipment:   Intra-op Plan:   Post-operative Plan:   Informed Consent: I have reviewed the patients History and Physical, chart, labs and discussed the procedure including the risks, benefits and alternatives for the proposed anesthesia with the patient or authorized representative who has indicated his/her understanding and acceptance.   Dental advisory given  Plan Discussed with: CRNA  Anesthesia Plan Comments:        Anesthesia Quick Evaluation

## 2017-03-07 NOTE — Progress Notes (Signed)
  Echocardiogram Echocardiogram Transesophageal has been performed.  Darlina Sicilian M 03/07/2017, 11:12 AM

## 2017-03-07 NOTE — Transfer of Care (Signed)
Immediate Anesthesia Transfer of Care Note  Patient: Angel Meyer  Procedure(s) Performed: Procedure(s): TRANSESOPHAGEAL ECHOCARDIOGRAM (TEE) (N/A) CARDIOVERSION (N/A)  Patient Location: Endoscopy Unit  Anesthesia Type:MAC  Level of Consciousness: awake, alert  and oriented  Airway & Oxygen Therapy: Patient Spontanous Breathing  Post-op Assessment: Report given to RN and Post -op Vital signs reviewed and stable  Post vital signs: Reviewed and stable  Last Vitals:  Vitals:   03/07/17 0858  BP: (!) 143/107  Pulse: (!) 120  Resp: 16  Temp: 36.8 C    Last Pain:  Vitals:   03/07/17 0858  TempSrc: Oral         Complications: No apparent anesthesia complications

## 2017-03-07 NOTE — Discharge Instructions (Signed)
TEE ° °YOU HAD AN CARDIAC PROCEDURE TODAY: Refer to the procedure report and other information in the discharge instructions given to you for any specific questions about what was found during the examination. If this information does not answer your questions, please call [facility] office at [phone] to clarify.  ° °DIET: Your first meal following the procedure should be a light meal and then it is ok to progress to your normal diet. A half-sandwich or bowl of soup is an example of a good first meal. Heavy or fried foods are harder to digest and may make you feel nauseous or bloated. Drink plenty of fluids but you should avoid alcoholic beverages for 24 hours. If you had a esophageal dilation, please see attached instructions for diet.  ° °ACTIVITY: Your care partner should take you home directly after the procedure. You should plan to take it easy, moving slowly for the rest of the day. You can resume normal activity the day after the procedure however YOU SHOULD NOT DRIVE, use power tools, machinery or perform tasks that involve climbing or major physical exertion for 24 hours (because of the sedation medicines used during the test).  ° °SYMPTOMS TO REPORT IMMEDIATELY: °A cardiologist can be reached at any hour. Please call [phone] for any of the following symptoms:  °Vomiting of blood or coffee ground material  °New, significant abdominal pain  °New, significant chest pain or pain under the shoulder blades  °Painful or persistently difficult swallowing  °New shortness of breath  °Black, tarry-looking or red, bloody stools ° °FOLLOW UP:  °Please also call with any specific questions about appointments or follow up tests. ° °Electrical Cardioversion, Care After °This sheet gives you information about how to care for yourself after your procedure. Your health care provider may also give you more specific instructions. If you have problems or questions, contact your health care provider. °What can I  expect after the procedure? °After the procedure, it is common to have: °· Some redness on the skin where the shocks were given. ° °Follow these instructions at home: °· Do not drive for 24 hours if you were given a medicine to help you relax (sedative). °· Take over-the-counter and prescription medicines only as told by your health care provider. °· Ask your health care provider how to check your pulse. Check it often. °· Rest for 48 hours after the procedure or as told by your health care provider. °· Avoid or limit your caffeine use as told by your health care provider. °Contact a health care provider if: °· You feel like your heart is beating too quickly or your pulse is not regular. °· You have a serious muscle cramp that does not go away. °Get help right away if: °· You have discomfort in your chest. °· You are dizzy or you feel faint. °· You have trouble breathing or you are short of breath. °· Your speech is slurred. °· You have trouble moving an arm or leg on one side of your body. °· Your fingers or toes turn cold or blue. °This information is not intended to replace advice given to you by your health care provider. Make sure you discuss any questions you have with your health care provider. °Document Released: 07/04/2013 Document Revised: 04/16/2016 Document Reviewed: 03/19/2016 °Elsevier Interactive Patient Education © 2018 [employer] ° °

## 2017-03-07 NOTE — H&P (View-Only) (Signed)
Primary Care Physician: [name], MD Referring Physician: Dr. Drucie Ip Mcclarty is a 83 y.o. female with a h/o afib on amiodarone that for most part stays in SR but occasionally will need cardioversion. Last one in 2016.She is on amiodarone at 100 mg a day.She had a back surgery two weeks ago receiving cement in the back. After that she starting feeling fatigued and the daughter noted increase in heart rate. She was off warfarin from 5/10 to 5/17 for back surgery.Ekg shows afib at 135 bpm.   Today, she denies symptoms of palpitations, chest pain, shortness of breath, orthopnea, PND, lower extremity edema, dizziness, presyncope, syncope, or neurologic sequela. The patient is tolerating medications without difficulties and is otherwise without complaint today.   Past Medical History:  Diagnosis Date  . Arthritis   . Diastolic dysfunction    preserved EF,  CHF In setting of afib previously  . Dysrhythmia    afib  . History of blood transfusion   . Hyperlipidemia   . Hypertension   . Incontinence of urine   . OSA (obstructive sleep apnea)    uses CPAP  . Osteoporosis   . Persistent atrial fibrillation (HCC)    failed on Flecainide and Tikosyn  . Renal calculi   . Restless legs   . Rheumatic fever    age 47  . Systolic heart failure (HCC)    EF 30 to 35% per echo 08/2013   Past Surgical History:  Procedure Laterality Date  . ABDOMINAL HYSTERECTOMY    . BREAST SURGERY     benign biopsy  . CARDIOVERSION N/A 09/10/2013   Procedure: CARDIOVERSION;  Surgeon: Thompson Grayer, MD;  Location: [place];  Service: Cardiovascular;  Laterality: N/A;  . CARDIOVERSION N/A 11/16/2013   Procedure: CARDIOVERSION;  Surgeon: Sueanne Margarita, MD;  Location: [facility] ENDOSCOPY;  Service: Cardiovascular;  Laterality: N/A;  . CARDIOVERSION N/A 08/18/2015   Procedure: CARDIOVERSION;  Surgeon: Sueanne Margarita, MD;  Location: [facility] ENDOSCOPY;  Service: Cardiovascular;  Laterality: N/A;  . IR RADIOLOGIST  EVAL & MGMT  02/04/2017  . IR VERTEBROPLASTY LUMBAR BX INC UNI/BIL INC/INJECT/IMAGING  02/10/2017  . [place] removed  . REPLACEMENT TOTAL KNEE  2005  . SPINE SURGERY     laminectomy 1965, spinal fusion with rod 2005  . TONSILLECTOMY      Current Outpatient Prescriptions  Medication Sig Dispense Refill  . amiodarone (PACERONE) 200 MG tablet Take 1 tablet (200 mg total) by mouth daily. 45 tablet 3  . diltiazem (CARDIZEM) 30 MG tablet Take 1 tablet (30 mg total) by mouth every 6 (six) hours as needed (AFIB w/pulse >100 bpm until resolved). 30 tablet 3  . donepezil (ARICEPT ODT) 10 MG disintegrating tablet Take 1 tablet (10 mg total) by mouth at bedtime. 90 tablet 2  . escitalopram (LEXAPRO) 10 MG tablet Take 1 tablet (10 mg total) by mouth daily. 90 tablet 3  . furosemide (LASIX) 40 MG tablet Take 1 tablet (40 mg total) by mouth daily. 90 tablet 3  . Melatonin 5 MG TABS Take 10 mg by mouth at bedtime.     . memantine (NAMENDA) 10 MG tablet Take 1 tablet (10 mg total) by mouth 2 (two) times daily. 180 tablet 2  . oxybutynin (DITROPAN-XL) 10 MG 24 hr tablet Take 10 mg by mouth daily.   11  . potassium chloride SA (K-DUR,KLOR-CON) 20 MEQ tablet Take 1 tablet (20 mEq total) by  mouth daily. 90 tablet 3  . rOPINIRole (REQUIP) 1 MG tablet Take 1 tablet (1 mg total) by mouth See admin instructions. Take 1 tablet (1 mg) by mouth daily at 4pm and at bedtime (Patient taking differently: Take 1 mg by mouth at bedtime. Take 1 tablet (1 mg) by mouth daily at 4pm and at bedtime) 180 tablet 2  . rosuvastatin (CRESTOR) 40 MG tablet Take 1 tablet (40 mg total) by mouth at bedtime. 90 tablet 3  . spironolactone (ALDACTONE) 25 MG tablet Take 1 tablet (25 mg total) by mouth daily. 90 tablet 3  . trimethoprim (TRIMPEX) 100 MG tablet Take 100 mg by mouth daily.   11  . warfarin (COUMADIN) 2.5 MG tablet Take as directed by anticoagulation clinic. (Patient taking differently: Take 2.5 mg by mouth  every evening. Take as directed by anticoagulation clinic.) 105 tablet 1  . clobetasol cream (TEMOVATE) 2.68 % Apply 1 application topically 2 (two) times daily as needed. For lichens simplex chronicus    . fluocinonide cream (LIDEX) 3.41 % Apply 1 application topically 2 (two) times daily as needed (CHRONIC RASH).    . Misc. Devices (ROLLER [place]) MISC Used as Directed. 1 each 0  . nitroGLYCERIN (NITROSTAT) 0.4 MG SL tablet Place 1 tablet (0.4 mg total) under the tongue every 5 (five) minutes as needed for chest pain. 25 tablet 3   No current facility-administered medications for this encounter.     Allergies  Allergen Reactions  . Ciprofloxacin Nausea And Vomiting  . Demerol [Meperidine] Nausea And Vomiting  . Sulfa Antibiotics Nausea And Vomiting    And all derivatives -  GI Bleeding  . Sulfacetamide Sodium Nausea And Vomiting    Social History   Social History  . Marital status: Divorced    Spouse name: N/A  . Number of children: N/A  . Years of education: N/A   Occupational History  . Not on file.   Social History Main Topics  . Smoking status: Never Smoker  . Smokeless tobacco: Never Used  . Alcohol use Yes     Comment: glass of wine each day  . Drug use: No  . Sexual activity: Not Currently   Other Topics Concern  . Not on file   Social History Narrative   Lives in [place] alone.  Spends 4-6 months per year in [place] with her daughter.   Retired Network engineer    Family History  Problem Relation Age of Onset  . Cancer Mother        breast  . Diabetes Mother   . Cancer Paternal Grandfather     ROS- All systems are reviewed and negative except as per the HPI above  Physical Exam: Vitals:   03/01/17 1413  BP: 110/62  Pulse: (!) 135  Weight: 190 lb 9.6 oz (86.5 kg)  Height: 5' (1.524 m)   Wt Readings from Last 3 Encounters:  03/01/17 190 lb 9.6 oz (86.5 kg)  02/10/17 200 lb (90.7 kg)  02/08/17 200 lb (90.7 kg)    Labs: Lab Results  Component  Value Date   NA 138 03/01/2017   K 4.4 03/01/2017   CL 108 03/01/2017   CO2 20 (L) 03/01/2017   GLUCOSE 106 (H) 03/01/2017   BUN 15 03/01/2017   CREATININE 1.21 (H) 03/01/2017   CALCIUM 9.4 03/01/2017   MG 2.3 09/14/2013   Lab Results  Component Value Date   INR 2.90 03/01/2017   Lab Results  Component Value Date   CHOL  215 (H) 12/07/2016   HDL 78 12/07/2016   LDLCALC 111 (H) 12/07/2016   TRIG 130 12/07/2016     GEN- The patient is well appearing, alert and oriented x 3 today.   Head- normocephalic, atraumatic Eyes-  Sclera clear, conjunctiva pink Ears- hearing intact Oropharynx- clear Neck- supple, no JVP Lymph- no cervical lymphadenopathy Lungs- Clear to ausculation bilaterally, normal work of breathing Heart- Rapid irregular rate and rhythm, no murmurs, rubs or gallops, PMI not laterally displaced GI- soft, NT, ND, + BS Extremities- no clubbing, cyanosis, or edema MS- no significant deformity or atrophy Skin- no rash or lesion Psych- euthymic mood, full affect Neuro- strength and sensation are intact  EKG-afib at 135 bpm, qrs int 78 ms, , qtc 516 ms Epic records reviewed    Assessment and Plan: 1. Paroxysmal Afib Encouraged to use prn Cardizem to keep HR down if HR is over 100 and sys BP is over 100. Will increase amiodarone to 200 mg bid in preparation for cardioversion, reduce amiodarone to 200 mg a day following cardioversion Plan on cardioversion 6/11 with TEE since coumadin was recently stopped x one week and 4 weekly INR's not in place INR today with cbc, bmet and INR again day of cardioversion. Also keep INR appointment with PCP for INR to be checked again 6/19 with fluctuation in amiodarrone  See one week f/u cardioversion  Butch Penny C. Carroll, [facility] [address] [place], [place] [address] [phone]

## 2017-03-08 ENCOUNTER — Other Ambulatory Visit: Payer: Self-pay | Admitting: Family Medicine

## 2017-03-08 ENCOUNTER — Encounter (HOSPITAL_COMMUNITY): Payer: Self-pay | Admitting: Cardiology

## 2017-03-14 ENCOUNTER — Encounter (HOSPITAL_COMMUNITY): Payer: Self-pay | Admitting: Nurse Practitioner

## 2017-03-14 ENCOUNTER — Ambulatory Visit: Payer: Medicare Other

## 2017-03-14 ENCOUNTER — Ambulatory Visit (HOSPITAL_COMMUNITY)
Admission: RE | Admit: 2017-03-14 | Discharge: 2017-03-14 | Disposition: A | Discharge status: Home / Self Care | Payer: Medicare Other | Source: Ambulatory Visit | Attending: Nurse Practitioner | Admitting: Nurse Practitioner

## 2017-03-14 VITALS — BP 92/66 | HR 124 | Ht 60.0 in | Wt 191.0 lb

## 2017-03-14 DIAGNOSIS — I110 Hypertensive heart disease with heart failure: Secondary | ICD-10-CM | POA: Diagnosis not present

## 2017-03-14 DIAGNOSIS — I480 Paroxysmal atrial fibrillation: Secondary | ICD-10-CM | POA: Insufficient documentation

## 2017-03-14 DIAGNOSIS — G4733 Obstructive sleep apnea (adult) (pediatric): Secondary | ICD-10-CM | POA: Diagnosis not present

## 2017-03-14 DIAGNOSIS — I5020 Unspecified systolic (congestive) heart failure: Secondary | ICD-10-CM | POA: Insufficient documentation

## 2017-03-14 DIAGNOSIS — I4819 Other persistent atrial fibrillation: Secondary | ICD-10-CM

## 2017-03-14 DIAGNOSIS — I481 Persistent atrial fibrillation: Secondary | ICD-10-CM | POA: Diagnosis not present

## 2017-03-14 DIAGNOSIS — G2581 Restless legs syndrome: Secondary | ICD-10-CM | POA: Insufficient documentation

## 2017-03-14 DIAGNOSIS — Z981 Arthrodesis status: Secondary | ICD-10-CM | POA: Insufficient documentation

## 2017-03-14 DIAGNOSIS — Z7901 Long term (current) use of anticoagulants: Secondary | ICD-10-CM | POA: Diagnosis not present

## 2017-03-14 DIAGNOSIS — E785 Hyperlipidemia, unspecified: Secondary | ICD-10-CM | POA: Diagnosis not present

## 2017-03-14 NOTE — Progress Notes (Addendum)
Primary Care Physician: [name], MD Referring Physician: Dr. Drucie Ip Angel Meyer is a 83 y.o. female with a h/o afib on amiodarone that for most part stays in SR but occasionally will need cardioversion. Last one in 2016.She is on amiodarone at 100 mg a day.She had a back surgery two weeks ago receiving cement in the back. After that she starting feeling fatigued and the daughter noted increase in heart rate. She was off warfarin from 5/10 to 5/17 for back surgery.Ekg shows afib at 135 bpm. She was set up fpr TEE cardioversion with increase of amiodarone to 200 mg bid x one week prior to DCCV.  She returns to afib clinic one week s/p cardioversion and unfortunately had return to afib after 4 days in SR. She has RVR today unfortunately cannot add rate control due to soft BP at 90/60. Daughter is concerned because her mother gets very weak, harder to walk unassisted with afib. Continues on warfarin.  Today, she denies symptoms of chest pain,  orthopnea, PND, lower extremity edema, dizziness, presyncope, syncope, or neurologic sequela.  Positive for weakness, fatigue, shortness of breath.The patient is tolerating medications without difficulties and is otherwise without complaint today.   Past Medical History:  Diagnosis Date  . Arthritis   . Diastolic dysfunction    preserved EF,  CHF In setting of afib previously  . Dysrhythmia    afib  . History of blood transfusion   . Hyperlipidemia   . Hypertension   . Incontinence of urine   . OSA (obstructive sleep apnea)    uses CPAP  . Osteoporosis   . Persistent atrial fibrillation (HCC)    failed on Flecainide and Tikosyn  . Renal calculi   . Restless legs   . Rheumatic fever    age 83  . Systolic heart failure (HCC)    EF 30 to 35% per echo 08/2013   Past Surgical History:  Procedure Laterality Date  . ABDOMINAL HYSTERECTOMY    . BREAST SURGERY     benign biopsy  . CARDIOVERSION N/A 09/10/2013   Procedure:  CARDIOVERSION;  Surgeon: Thompson Grayer, MD;  Location: [place];  Service: Cardiovascular;  Laterality: N/A;  . CARDIOVERSION N/A 11/16/2013   Procedure: CARDIOVERSION;  Surgeon: Sueanne Margarita, MD;  Location: [facility] ENDOSCOPY;  Service: Cardiovascular;  Laterality: N/A;  . CARDIOVERSION N/A 08/18/2015   Procedure: CARDIOVERSION;  Surgeon: Sueanne Margarita, MD;  Location: [facility] ENDOSCOPY;  Service: Cardiovascular;  Laterality: N/A;  . CARDIOVERSION N/A 03/07/2017   Procedure: CARDIOVERSION;  Surgeon: Dorothy Spark, MD;  Location: [place];  Service: Cardiovascular;  Laterality: N/A;  . IR RADIOLOGIST EVAL & MGMT  02/04/2017  . IR VERTEBROPLASTY LUMBAR BX INC UNI/BIL INC/INJECT/IMAGING  02/10/2017  . [place] removed  . REPLACEMENT TOTAL KNEE  2005  . SPINE SURGERY     laminectomy 1965, spinal fusion with rod 2005  . TEE WITHOUT CARDIOVERSION N/A 03/07/2017   Procedure: TRANSESOPHAGEAL ECHOCARDIOGRAM (TEE);  Surgeon: Dorothy Spark, MD;  Location: [facility] ENDOSCOPY;  Service: Cardiovascular;  Laterality: N/A;  . TONSILLECTOMY      Current Outpatient Prescriptions  Medication Sig Dispense Refill  . amiodarone (PACERONE) 200 MG tablet Take 1 tablet (200 mg total) by mouth daily. 45 tablet 3  . clobetasol cream (TEMOVATE) 3.08 % Apply 1 application topically 2 (two) times daily as needed. For lichens simplex chronicus    . diltiazem (CARDIZEM) 30 MG tablet  Take 1 tablet (30 mg total) by mouth every 6 (six) hours as needed (AFIB w/pulse >100 bpm until resolved). 30 tablet 3  . donepezil (ARICEPT ODT) 10 MG disintegrating tablet Take 1 tablet (10 mg total) by mouth at bedtime. 90 tablet 2  . escitalopram (LEXAPRO) 10 MG tablet TAKE 1 TABLET EVERY DAY 90 tablet 1  . fluocinonide cream (LIDEX) 0.09 % Apply 1 application topically 2 (two) times daily as needed (CHRONIC RASH).    . furosemide (LASIX) 40 MG tablet Take 1 tablet (40 mg total) by mouth daily. 90 tablet 3  . Melatonin 5 MG  TABS Take 10 mg by mouth at bedtime.     . memantine (NAMENDA) 10 MG tablet Take 1 tablet (10 mg total) by mouth 2 (two) times daily. 180 tablet 2  . Misc. Devices (ROLLER [place]) MISC Used as Directed. 1 each 0  . nitroGLYCERIN (NITROSTAT) 0.4 MG SL tablet Place 1 tablet (0.4 mg total) under the tongue every 5 (five) minutes as needed for chest pain. 25 tablet 3  . oxybutynin (DITROPAN-XL) 10 MG 24 hr tablet Take 10 mg by mouth daily.   11  . potassium chloride SA (K-DUR,KLOR-CON) 20 MEQ tablet Take 1 tablet (20 mEq total) by mouth daily. 90 tablet 3  . rOPINIRole (REQUIP) 1 MG tablet Take 1 tablet (1 mg total) by mouth See admin instructions. Take 1 tablet (1 mg) by mouth daily at 4pm and at bedtime (Patient taking differently: Take 1 mg by mouth at bedtime. Take 1 tablet (1 mg) by mouth daily at 4pm and at bedtime) 180 tablet 2  . rosuvastatin (CRESTOR) 40 MG tablet Take 1 tablet (40 mg total) by mouth at bedtime. 90 tablet 3  . spironolactone (ALDACTONE) 25 MG tablet Take 1 tablet (25 mg total) by mouth daily. 90 tablet 3  . trimethoprim (TRIMPEX) 100 MG tablet Take 100 mg by mouth daily.   11  . warfarin (COUMADIN) 2.5 MG tablet Take as directed by anticoagulation clinic. (Patient taking differently: Take 2.5 mg by mouth every evening. Take as directed by anticoagulation clinic.) 105 tablet 1   No current facility-administered medications for this encounter.     Allergies  Allergen Reactions  . Ciprofloxacin Nausea And Vomiting  . Demerol [Meperidine] Nausea And Vomiting  . Sulfa Antibiotics Nausea And Vomiting    And all derivatives -  GI Bleeding  . Sulfacetamide Sodium Nausea And Vomiting    Social History   Social History  . Marital status: Divorced    Spouse name: N/A  . Number of children: N/A  . Years of education: N/A   Occupational History  . Not on file.   Social History Main Topics  . Smoking status: Never Smoker  . Smokeless tobacco: Never Used  . Alcohol use No   . Drug use: No  . Sexual activity: Not Currently   Other Topics Concern  . Not on file   Social History Narrative   Lives in [place] alone.  Spends 4-6 months per year in [place] with her daughter.   Retired Network engineer    Family History  Problem Relation Age of Onset  . Cancer Mother        breast  . Diabetes Mother   . Cancer Paternal Grandfather     ROS- All systems are reviewed and negative except as per the HPI above  Physical Exam: Vitals:   03/14/17 1042  BP: 92/66  Pulse: (!) 124  Weight: 191 lb (86.6  kg)  Height: 5' (1.524 m)   Wt Readings from Last 3 Encounters:  03/14/17 191 lb (86.6 kg)  03/07/17 193 lb (87.5 kg)  03/01/17 190 lb 9.6 oz (86.5 kg)    Labs: Lab Results  Component Value Date   NA 138 03/01/2017   K 4.4 03/01/2017   CL 108 03/01/2017   CO2 20 (L) 03/01/2017   GLUCOSE 106 (H) 03/01/2017   BUN 15 03/01/2017   CREATININE 1.21 (H) 03/01/2017   CALCIUM 9.4 03/01/2017   MG 2.3 09/14/2013   Lab Results  Component Value Date   INR 3.26 03/07/2017   Lab Results  Component Value Date   CHOL 215 (H) 12/07/2016   HDL 78 12/07/2016   LDLCALC 111 (H) 12/07/2016   TRIG 130 12/07/2016     GEN- The patient is well appearing, alert and oriented x 3 today.   Head- normocephalic, atraumatic Eyes-  Sclera clear, conjunctiva pink Ears- hearing intact Oropharynx- clear Neck- supple, no JVP Lymph- no cervical lymphadenopathy Lungs- Clear to ausculation bilaterally, normal work of breathing Heart- Rapid irregular rate and rhythm, no murmurs, rubs or gallops, PMI not laterally displaced GI- soft, NT, ND, + BS Extremities- no clubbing, cyanosis, or edema MS- no significant deformity or atrophy Skin- no rash or lesion Psych- euthymic mood, full affect Neuro- strength and sensation are intact  EKG-afib with rvr at 124 bpm Epic records reviewed    Assessment and Plan: 1. Persisitent Afib Unfortunately, successful cardioversion but with  ERAF Encouraged to use prn Cardizem to keep HR down if HR is over 100 and sys BP is over 100. Continue amiodarone 200 mg qd, Also keep INR appointment with PCP for INR to be checked again 6/19   Dr. Rayann Heman in the past has said that pt was not a candidate for afib ablation, she has previously failed flecainide and tikosyn, ? Candidate for AV nodal ablation/PPM  F/u with Dr. Rayann Heman  Friday in [place]. Angel Meyer, [facility] [address] [place], [place] [address] [phone]

## 2017-03-17 ENCOUNTER — Ambulatory Visit (INDEPENDENT_AMBULATORY_CARE_PROVIDER_SITE_OTHER): Payer: Medicare Other | Admitting: General Practice

## 2017-03-17 DIAGNOSIS — Z5181 Encounter for therapeutic drug level monitoring: Secondary | ICD-10-CM

## 2017-03-17 DIAGNOSIS — I4891 Unspecified atrial fibrillation: Secondary | ICD-10-CM

## 2017-03-17 LAB — POCT INR: INR: 2.1

## 2017-03-17 NOTE — Patient Instructions (Signed)
Pre visit review using our clinic review tool, if applicable. No additional management support is needed unless otherwise documented below in the visit note. 

## 2017-03-17 NOTE — Progress Notes (Signed)
I have reviewed and agree with the plan. 

## 2017-03-18 ENCOUNTER — Ambulatory Visit (INDEPENDENT_AMBULATORY_CARE_PROVIDER_SITE_OTHER): Payer: Medicare Other | Admitting: Internal Medicine

## 2017-03-18 ENCOUNTER — Other Ambulatory Visit: Payer: Self-pay | Admitting: Internal Medicine

## 2017-03-18 ENCOUNTER — Encounter: Payer: Self-pay | Admitting: Internal Medicine

## 2017-03-18 VITALS — BP 124/86 | HR 106 | Ht 61.0 in | Wt 191.0 lb

## 2017-03-18 DIAGNOSIS — G4733 Obstructive sleep apnea (adult) (pediatric): Secondary | ICD-10-CM

## 2017-03-18 DIAGNOSIS — I10 Essential (primary) hypertension: Secondary | ICD-10-CM

## 2017-03-18 DIAGNOSIS — I4891 Unspecified atrial fibrillation: Secondary | ICD-10-CM

## 2017-03-18 DIAGNOSIS — I481 Persistent atrial fibrillation: Secondary | ICD-10-CM

## 2017-03-18 DIAGNOSIS — I4819 Other persistent atrial fibrillation: Secondary | ICD-10-CM

## 2017-03-18 MED ORDER — AMIODARONE HCL 200 MG PO TABS: 200.0000 mg | ORAL_TABLET | Freq: Every day | ORAL | 3 refills | Status: DC

## 2017-03-18 MED ORDER — DILTIAZEM HCL ER COATED BEADS 240 MG PO CP24: 240.0000 mg | ORAL_CAPSULE | Freq: Every day | ORAL | 6 refills | Status: DC

## 2017-03-18 NOTE — Patient Instructions (Addendum)
Medication Instructions:   Begin Diltiazem CD 240mg  daily.  Increase Amiodarone to 200mg  twice a day  X 4 weeks, then 200mg  DAILY thereafter.  Continue all other medications.    Labwork: none  Testing/Procedures: none  Follow-Up: 8 weeks - Dr. Rayann Heman (either [facility] or [place])  Any Other Special Instructions Will Be Listed Below (If Applicable). 4 weeks - Roderic Palau, NP - Atrial Fibrillation clinic in [place].  If you need a refill on your cardiac medications before your next appointment, please call your pharmacy.

## 2017-03-18 NOTE — Progress Notes (Signed)
PCP: [name], MD  Angel Meyer is a 83 y.o. female who presents today for routine electrophysiology followup.  She has been in afib for the past month or so.  She has fatigue and decreased activity.  Her amiodarone was increased and she underwent cardioversion.  This was not successful.  She presents for additional converations today.  Today, she denies symptoms of palpitations, chest pain, shortness of breath,  lower extremity edema, dizziness, presyncope, or syncope.  The patient is otherwise without complaint today.   Past Medical History:  Diagnosis Date  . Arthritis   . Diastolic dysfunction    preserved EF,  CHF In setting of afib previously  . Dysrhythmia    afib  . History of blood transfusion   . Hyperlipidemia   . Hypertension   . Incontinence of urine   . OSA (obstructive sleep apnea)    uses CPAP  . Osteoporosis   . Persistent atrial fibrillation (HCC)    failed on Flecainide and Tikosyn  . Renal calculi   . Restless legs   . Rheumatic fever    age 59  . Systolic heart failure (HCC)    EF 30 to 35% per echo 08/2013   Past Surgical History:  Procedure Laterality Date  . ABDOMINAL HYSTERECTOMY    . BREAST SURGERY     benign biopsy  . CARDIOVERSION N/A 09/10/2013   Procedure: CARDIOVERSION;  Surgeon: Thompson Grayer, MD;  Location: [place];  Service: Cardiovascular;  Laterality: N/A;  . CARDIOVERSION N/A 11/16/2013   Procedure: CARDIOVERSION;  Surgeon: Sueanne Margarita, MD;  Location: [facility] ENDOSCOPY;  Service: Cardiovascular;  Laterality: N/A;  . CARDIOVERSION N/A 08/18/2015   Procedure: CARDIOVERSION;  Surgeon: Sueanne Margarita, MD;  Location: [facility] ENDOSCOPY;  Service: Cardiovascular;  Laterality: N/A;  . CARDIOVERSION N/A 03/07/2017   Procedure: CARDIOVERSION;  Surgeon: Dorothy Spark, MD;  Location: [place];  Service: Cardiovascular;  Laterality: N/A;  . IR RADIOLOGIST EVAL & MGMT  02/04/2017  . IR VERTEBROPLASTY LUMBAR BX INC UNI/BIL INC/INJECT/IMAGING   02/10/2017  . [place] removed  . REPLACEMENT TOTAL KNEE  2005  . SPINE SURGERY     laminectomy 1965, spinal fusion with rod 2005  . TEE WITHOUT CARDIOVERSION N/A 03/07/2017   Procedure: TRANSESOPHAGEAL ECHOCARDIOGRAM (TEE);  Surgeon: Dorothy Spark, MD;  Location: [facility] ENDOSCOPY;  Service: Cardiovascular;  Laterality: N/A;  . TONSILLECTOMY      ROS- all systems are reviewed and negatives except as per HPI above  Current Outpatient Prescriptions  Medication Sig Dispense Refill  . amiodarone (PACERONE) 200 MG tablet Take 1 tablet (200 mg total) by mouth daily. 45 tablet 3  . clobetasol cream (TEMOVATE) 1.88 % Apply 1 application topically 2 (two) times daily as needed. For lichens simplex chronicus    . diltiazem (CARDIZEM) 30 MG tablet Take 1 tablet (30 mg total) by mouth every 6 (six) hours as needed (AFIB w/pulse >100 bpm until resolved). 30 tablet 3  . donepezil (ARICEPT ODT) 10 MG disintegrating tablet Take 1 tablet (10 mg total) by mouth at bedtime. 90 tablet 2  . escitalopram (LEXAPRO) 10 MG tablet TAKE 1 TABLET EVERY DAY 90 tablet 1  . fluocinonide cream (LIDEX) 4.16 % Apply 1 application topically 2 (two) times daily as needed (CHRONIC RASH).    . furosemide (LASIX) 40 MG tablet Take 1 tablet (40 mg total) by mouth daily. 90 tablet 3  . Melatonin 5 MG TABS  Take 10 mg by mouth at bedtime.     . memantine (NAMENDA) 10 MG tablet Take 1 tablet (10 mg total) by mouth 2 (two) times daily. 180 tablet 2  . Misc. Devices (ROLLER [place]) MISC Used as Directed. 1 each 0  . nitroGLYCERIN (NITROSTAT) 0.4 MG SL tablet Place 1 tablet (0.4 mg total) under the tongue every 5 (five) minutes as needed for chest pain. 25 tablet 3  . oxybutynin (DITROPAN-XL) 10 MG 24 hr tablet Take 10 mg by mouth daily.   11  . potassium chloride SA (K-DUR,KLOR-CON) 20 MEQ tablet Take 1 tablet (20 mEq total) by mouth daily. 90 tablet 3  . rOPINIRole (REQUIP) 1 MG tablet Take 1 tablet (1 mg total)  by mouth See admin instructions. Take 1 tablet (1 mg) by mouth daily at 4pm and at bedtime (Patient taking differently: Take 1 mg by mouth at bedtime. Take 1 tablet (1 mg) by mouth daily at 4pm and at bedtime) 180 tablet 2  . rosuvastatin (CRESTOR) 40 MG tablet Take 1 tablet (40 mg total) by mouth at bedtime. 90 tablet 3  . spironolactone (ALDACTONE) 25 MG tablet Take 1 tablet (25 mg total) by mouth daily. 90 tablet 3  . trimethoprim (TRIMPEX) 100 MG tablet Take 100 mg by mouth daily.   11  . warfarin (COUMADIN) 2.5 MG tablet Take as directed by anticoagulation clinic. (Patient taking differently: Take 2.5 mg by mouth every evening. Take as directed by anticoagulation clinic.) 105 tablet 1   No current facility-administered medications for this visit.     Physical Exam: Vitals:   03/18/17 0936  BP: 124/86  Pulse: (!) 106  SpO2: 97%  Weight: 191 lb (86.6 kg)  Height: 5\' 1"  (1.549 m)    GEN- The patient is well appearing, alert and oriented x 3 today.   Head- normocephalic, atraumatic Eyes-  Sclera clear, conjunctiva pink Ears- hearing intact Oropharynx- clear Lungs- Clear to ausculation bilaterally, normal work of breathing Heart- irregular rate and rhythm, no murmurs, rubs or gallops, PMI not laterally displaced GI- soft, NT, ND, + BS Extremities- no clubbing, cyanosis, or edema  EKG tracing ordered today is personally reviewed and shows afib, V rate 103 bpm, incomplete LBBB  AF clinic notes reviewed,  Recent TEE and 2D echo reviewed  Assessment and Plan:  1. Persistent afib She is quiet symptomatic but also not very well rate controlled. Will start diltiazem CD 240mg  daily which she has previously tolerated Continue anticoagulation Therapeutic strategies for afib including rate control and rhythm were discussed in detail with the patient today. Risk, benefits, and alternatives to each approach were discussed.  Given her rheumatic heart disease, I suspect that our ability to  maintain sinus long term is low.  She would like to avoid procedures.  Not a good candidate for ablation. Will increase amiodarone to 200mg  BID again today Follow-up in AF clinic in 4 weeks.  If still in afib, would repeat cardioversion at that time.  Hold diltiazem day of cardioversion. Return to see me in 8 weeks.  2. OSA Stable No change required today  3. HTN Stable No change required today  4. Obesity Body mass index is 36.09 kg/m.  Today, I have spent 25 minutes with the patient discussing afib management .  More than 50% of the visit time today was spent on this issue.  Follow-up as above  Thompson Grayer MD, [facility] 03/18/2017 9:57 AM

## 2017-03-18 NOTE — Addendum Note (Signed)
Encounter addended by: Sherran Needs, NP on: 03/18/2017 10:29 AM<BR>    Actions taken: Sign clinical note

## 2017-03-24 ENCOUNTER — Telehealth: Payer: Self-pay | Admitting: *Deleted

## 2017-03-24 NOTE — Telephone Encounter (Signed)
PA for AMIODARONE approved by [facility] until 03/24/2018

## 2017-04-05 ENCOUNTER — Telehealth: Payer: Self-pay | Admitting: *Deleted

## 2017-04-05 NOTE — Telephone Encounter (Signed)
Angel Meyer, pts daughter [phone]) called stating she is trying to get the pt into [facility] due to her having had 3 falls, vertebral fracture and is now in A-fib and it is too much for her to take care of the pt at her home.  [name] also stated the pt will need a TB skin test and she questioned if she could schedule this only or does the pt need to come in to have the paperwork completed first (stated paperwork was to be faxed last week)?

## 2017-04-05 NOTE — Telephone Encounter (Signed)
We can go ahead with PPD.   I have not seen any paperwork with regard to this.

## 2017-04-05 NOTE — Telephone Encounter (Signed)
I left a detailed message with the information below on Angel Meyer's voicemail.

## 2017-04-06 ENCOUNTER — Telehealth: Payer: Self-pay | Admitting: Family Medicine

## 2017-04-06 DIAGNOSIS — R4189 Other symptoms and signs involving cognitive functions and awareness: Secondary | ICD-10-CM

## 2017-04-06 DIAGNOSIS — I4891 Unspecified atrial fibrillation: Secondary | ICD-10-CM

## 2017-04-06 NOTE — Telephone Encounter (Signed)
04/06/2017-Patient's daughter [name]) came in stating that forms were faxed but not received by our office, so she was dropping off the forms as they need them ASAP.  Please complete forms for patient to go to assisted living and fax ATTN to Blair Dolphin at [phone].  Daughter would like to be contacted as well upon completion and will pick up Friday 04/08/17 during patients appt if possible.  Daughter acknowledged understanding of possible fee for forms.  Forms given directly to [facility].

## 2017-04-08 ENCOUNTER — Ambulatory Visit (INDEPENDENT_AMBULATORY_CARE_PROVIDER_SITE_OTHER): Payer: Medicare Other | Admitting: *Deleted

## 2017-04-08 DIAGNOSIS — Z111 Encounter for screening for respiratory tuberculosis: Secondary | ICD-10-CM

## 2017-04-08 NOTE — Progress Notes (Signed)
Patient here for TB skin test; placed PPD to left forearm; patient tolerated well; no s/s of immediate reaction; patient and caregiver educated on potential side effects and to return in 48-72 hours for reading; understanding voiced.

## 2017-04-11 ENCOUNTER — Ambulatory Visit (INDEPENDENT_AMBULATORY_CARE_PROVIDER_SITE_OTHER): Payer: Medicare Other | Admitting: General Practice

## 2017-04-11 ENCOUNTER — Ambulatory Visit: Payer: Medicare Other

## 2017-04-11 DIAGNOSIS — Z5181 Encounter for therapeutic drug level monitoring: Secondary | ICD-10-CM

## 2017-04-11 DIAGNOSIS — I4891 Unspecified atrial fibrillation: Secondary | ICD-10-CM | POA: Diagnosis not present

## 2017-04-11 LAB — POCT INR: INR: 1.6

## 2017-04-11 NOTE — Telephone Encounter (Signed)
Forms were completed and picked up by daughter. Form fee was paid.

## 2017-04-11 NOTE — Patient Instructions (Signed)
Pre visit review using our clinic review tool, if applicable. No additional management support is needed unless otherwise documented below in the visit note. 

## 2017-04-18 ENCOUNTER — Telehealth: Payer: Self-pay | Admitting: Family Medicine

## 2017-04-18 ENCOUNTER — Encounter (HOSPITAL_COMMUNITY): Payer: Self-pay | Admitting: Nurse Practitioner

## 2017-04-18 ENCOUNTER — Ambulatory Visit (HOSPITAL_COMMUNITY)
Admission: RE | Admit: 2017-04-18 | Discharge: 2017-04-18 | Disposition: A | Discharge status: Home / Self Care | Payer: Medicare Other | Source: Ambulatory Visit | Attending: Nurse Practitioner | Admitting: Nurse Practitioner

## 2017-04-18 VITALS — BP 112/74 | HR 113 | Ht 61.0 in | Wt 194.8 lb

## 2017-04-18 DIAGNOSIS — Z7902 Long term (current) use of antithrombotics/antiplatelets: Secondary | ICD-10-CM | POA: Insufficient documentation

## 2017-04-18 DIAGNOSIS — Z885 Allergy status to narcotic agent status: Secondary | ICD-10-CM | POA: Insufficient documentation

## 2017-04-18 DIAGNOSIS — Z981 Arthrodesis status: Secondary | ICD-10-CM | POA: Diagnosis not present

## 2017-04-18 DIAGNOSIS — Z809 Family history of malignant neoplasm, unspecified: Secondary | ICD-10-CM | POA: Insufficient documentation

## 2017-04-18 DIAGNOSIS — G4733 Obstructive sleep apnea (adult) (pediatric): Secondary | ICD-10-CM | POA: Insufficient documentation

## 2017-04-18 DIAGNOSIS — I5020 Unspecified systolic (congestive) heart failure: Secondary | ICD-10-CM | POA: Diagnosis not present

## 2017-04-18 DIAGNOSIS — I481 Persistent atrial fibrillation: Secondary | ICD-10-CM

## 2017-04-18 DIAGNOSIS — E785 Hyperlipidemia, unspecified: Secondary | ICD-10-CM | POA: Diagnosis not present

## 2017-04-18 DIAGNOSIS — M810 Age-related osteoporosis without current pathological fracture: Secondary | ICD-10-CM | POA: Diagnosis not present

## 2017-04-18 DIAGNOSIS — Z96659 Presence of unspecified artificial knee joint: Secondary | ICD-10-CM | POA: Diagnosis not present

## 2017-04-18 DIAGNOSIS — Z881 Allergy status to other antibiotic agents status: Secondary | ICD-10-CM | POA: Insufficient documentation

## 2017-04-18 DIAGNOSIS — Z803 Family history of malignant neoplasm of breast: Secondary | ICD-10-CM | POA: Diagnosis not present

## 2017-04-18 DIAGNOSIS — Z833 Family history of diabetes mellitus: Secondary | ICD-10-CM | POA: Insufficient documentation

## 2017-04-18 DIAGNOSIS — Z87442 Personal history of urinary calculi: Secondary | ICD-10-CM | POA: Diagnosis not present

## 2017-04-18 DIAGNOSIS — I4819 Other persistent atrial fibrillation: Secondary | ICD-10-CM

## 2017-04-18 DIAGNOSIS — Z90710 Acquired absence of both cervix and uterus: Secondary | ICD-10-CM | POA: Diagnosis not present

## 2017-04-18 DIAGNOSIS — I110 Hypertensive heart disease with heart failure: Secondary | ICD-10-CM | POA: Insufficient documentation

## 2017-04-18 DIAGNOSIS — G2581 Restless legs syndrome: Secondary | ICD-10-CM | POA: Diagnosis not present

## 2017-04-18 DIAGNOSIS — Z882 Allergy status to sulfonamides status: Secondary | ICD-10-CM | POA: Insufficient documentation

## 2017-04-18 DIAGNOSIS — Z9889 Other specified postprocedural states: Secondary | ICD-10-CM | POA: Diagnosis not present

## 2017-04-18 LAB — BASIC METABOLIC PANEL
ANION GAP: 8 (ref 5–15)
BUN: 25 mg/dL — AB (ref 6–20)
CO2: 23 mmol/L (ref 22–32)
Calcium: 9.5 mg/dL (ref 8.9–10.3)
Chloride: 106 mmol/L (ref 101–111)
Creatinine, Ser: 1.21 mg/dL — ABNORMAL HIGH (ref 0.44–1.00)
GFR, EST AFRICAN AMERICAN: 47 mL/min — AB (ref >60)
GFR, EST NON AFRICAN AMERICAN: 41 mL/min — AB (ref >60)
Glucose, Bld: 96 mg/dL (ref 65–99)
POTASSIUM: 4.6 mmol/L (ref 3.5–5.1)
SODIUM: 137 mmol/L (ref 135–145)

## 2017-04-18 LAB — PROTIME-INR
INR: 2.21
PROTHROMBIN TIME: 24.9 s — AB (ref 11.4–15.2)

## 2017-04-18 NOTE — Telephone Encounter (Signed)
Paperwork corrected by Dr Elease Hashimoto.  Faxed and confirmed.

## 2017-04-18 NOTE — Telephone Encounter (Signed)
° °  Debbie with [facility] call to say she sent over some paperwork and need to have that paperwork fax back today sothat pt can move in tomorrow   [phone]

## 2017-04-18 NOTE — Telephone Encounter (Signed)
Jackelyn Poling is calling back stating she needs addt'l clarification as question 14 and 16 were not answered to include where to apply the creams.  Also, On #3 it looks like PRN for Afib, techs are not going to know if it's for A-fib.  Can it be specific numbers ie. What numbers to look for or stat readings.  Finally she is looking for PT and OT orders in addition to the other paperwork faxed.  Please fax back over to [phone].  Can call Debbie with addt'l questions at [phone].

## 2017-04-18 NOTE — Patient Instructions (Signed)
Cardioversion scheduled for Thursday, August 2nd   - Arrive at the [facility] and go to admitting at 12:30pm  -Do not eat or drink anything after midnight the night prior to your procedure.  - Take all your medication with a sip of water prior to arrival EXCEPT CARDIZEM  - You will not be able to drive home after your procedure.

## 2017-04-18 NOTE — Progress Notes (Addendum)
Primary Care Physician: [name], MD Referring Physician: Dr. Drucie Ip Bayless is a 83 y.o. female with a h/o afib on amiodarone that for most part stays in SR but occasionally will need cardioversion. Last one in 2016.She is on amiodarone at 100 mg a day.She had a back surgery two weeks ago receiving cement in the back. After that she starting feeling fatigued and the daughter noted increase in heart rate. She was off warfarin from 5/10 to 5/17 for back surgery.Ekg shows afib at 135 bpm. She was set up fpr TEE cardioversion with increase of amiodarone to 200 mg bid x one week prior to DCCV.  She returns to afib clinic one week s/p cardioversion and unfortunately had return to afib after 4 days in SR. She has RVR today unfortunately cannot add rate control due to soft BP at 90/60. Daughter is concerned because her mother gets very weak, harder to walk unassisted with afib. Continues on warfarin.  She had f/u with Dr. Rayann Heman and was started on cardizem to help with rate control and amiodarone was increased to 200 mg bid and if she reminded in afib, she would need cardioversion after 4 weeks. Unfortunately, she does remain in afib so will schedule for TEE guided cardioversion as she was subtherapeutic last week. Being started on the cardizem did help with rate control and her symptoms. She will be moving to an assisted care facility tomorrow.  Today, she denies symptoms of chest pain,  orthopnea, PND, lower extremity edema, dizziness, presyncope, syncope, or neurologic sequela.  Positive for weakness, fatigue, shortness of breath.The patient is tolerating medications without difficulties and is otherwise without complaint today.   Past Medical History:  Diagnosis Date  . Arthritis   . Diastolic dysfunction    preserved EF,  CHF In setting of afib previously  . Dysrhythmia    afib  . History of blood transfusion   . Hyperlipidemia   . Hypertension   . Incontinence of urine   .  OSA (obstructive sleep apnea)    uses CPAP  . Osteoporosis   . Persistent atrial fibrillation (HCC)    failed on Flecainide and Tikosyn  . Renal calculi   . Restless legs   . Rheumatic fever    age 42  . Systolic heart failure (HCC)    EF 30 to 35% per echo 08/2013   Past Surgical History:  Procedure Laterality Date  . ABDOMINAL HYSTERECTOMY    . BREAST SURGERY     benign biopsy  . CARDIOVERSION N/A 09/10/2013   Procedure: CARDIOVERSION;  Surgeon: Thompson Grayer, MD;  Location: [place];  Service: Cardiovascular;  Laterality: N/A;  . CARDIOVERSION N/A 11/16/2013   Procedure: CARDIOVERSION;  Surgeon: Sueanne Margarita, MD;  Location: [facility] ENDOSCOPY;  Service: Cardiovascular;  Laterality: N/A;  . CARDIOVERSION N/A 08/18/2015   Procedure: CARDIOVERSION;  Surgeon: Sueanne Margarita, MD;  Location: [facility] ENDOSCOPY;  Service: Cardiovascular;  Laterality: N/A;  . CARDIOVERSION N/A 03/07/2017   Procedure: CARDIOVERSION;  Surgeon: Dorothy Spark, MD;  Location: [place];  Service: Cardiovascular;  Laterality: N/A;  . IR RADIOLOGIST EVAL & MGMT  02/04/2017  . IR VERTEBROPLASTY LUMBAR BX INC UNI/BIL INC/INJECT/IMAGING  02/10/2017  . [place] removed  . REPLACEMENT TOTAL KNEE  2005  . SPINE SURGERY     laminectomy 1965, spinal fusion with rod 2005  . TEE WITHOUT CARDIOVERSION N/A 03/07/2017   Procedure: TRANSESOPHAGEAL ECHOCARDIOGRAM (TEE);  Surgeon: Dorothy Spark, MD;  Location: [facility] ENDOSCOPY;  Service: Cardiovascular;  Laterality: N/A;  . TONSILLECTOMY      Current Outpatient Prescriptions  Medication Sig Dispense Refill  . amiodarone (PACERONE) 200 MG tablet Take 1 tablet (200 mg total) by mouth daily. 90 tablet 3  . CARTIA XT 240 MG 24 hr capsule TAKE 1 CAPSULE BY MOUTH EVERY DAY 90 capsule 3  . clobetasol cream (TEMOVATE) 9.02 % Apply 1 application topically 2 (two) times daily as needed. For lichens simplex chronicus    . donepezil (ARICEPT ODT) 10 MG disintegrating  tablet Take 1 tablet (10 mg total) by mouth at bedtime. 90 tablet 2  . escitalopram (LEXAPRO) 10 MG tablet TAKE 1 TABLET EVERY DAY 90 tablet 1  . fluocinonide cream (LIDEX) 4.09 % Apply 1 application topically 2 (two) times daily as needed (CHRONIC RASH).    . furosemide (LASIX) 40 MG tablet Take 1 tablet (40 mg total) by mouth daily. 90 tablet 3  . Melatonin 5 MG TABS Take 10 mg by mouth at bedtime.     . memantine (NAMENDA) 10 MG tablet Take 1 tablet (10 mg total) by mouth 2 (two) times daily. 180 tablet 2  . Misc. Devices (ROLLER [place]) MISC Used as Directed. 1 each 0  . nitroGLYCERIN (NITROSTAT) 0.4 MG SL tablet Place 1 tablet (0.4 mg total) under the tongue every 5 (five) minutes as needed for chest pain. 25 tablet 3  . oxybutynin (DITROPAN-XL) 10 MG 24 hr tablet Take 10 mg by mouth daily.   11  . potassium chloride SA (K-DUR,KLOR-CON) 20 MEQ tablet Take 1 tablet (20 mEq total) by mouth daily. 90 tablet 3  . rOPINIRole (REQUIP) 1 MG tablet Take 1 tablet (1 mg total) by mouth See admin instructions. Take 1 tablet (1 mg) by mouth daily at 4pm and at bedtime (Patient taking differently: Take 1 mg by mouth at bedtime. Take 1 tablet (1 mg) by mouth daily at 4pm and at bedtime) 180 tablet 2  . rosuvastatin (CRESTOR) 40 MG tablet Take 1 tablet (40 mg total) by mouth at bedtime. 90 tablet 3  . spironolactone (ALDACTONE) 25 MG tablet Take 1 tablet (25 mg total) by mouth daily. 90 tablet 3  . trimethoprim (TRIMPEX) 100 MG tablet Take 100 mg by mouth daily.   11  . warfarin (COUMADIN) 2.5 MG tablet Take as directed by anticoagulation clinic. (Patient taking differently: Take 2.5 mg by mouth every evening. Take as directed by anticoagulation clinic.) 105 tablet 1  . diltiazem (CARDIZEM) 30 MG tablet Take 1 tablet (30 mg total) by mouth every 6 (six) hours as needed (AFIB w/pulse >100 bpm until resolved). (Patient not taking: Reported on 04/18/2017) 30 tablet 3   No current facility-administered medications  for this encounter.     Allergies  Allergen Reactions  . Ciprofloxacin Nausea And Vomiting  . Demerol [Meperidine] Nausea And Vomiting  . Sulfa Antibiotics Nausea And Vomiting    And all derivatives -  GI Bleeding  . Sulfacetamide Sodium Nausea And Vomiting    Social History   Social History  . Marital status: Divorced    Spouse name: N/A  . Number of children: N/A  . Years of education: N/A   Occupational History  . Not on file.   Social History Main Topics  . Smoking status: Never Smoker  . Smokeless tobacco: Never Used  . Alcohol use No  . Drug use: No  . Sexual activity: Not Currently  Other Topics Concern  . Not on file   Social History Narrative   Lives in [place] alone.  Spends 4-6 months per year in [place] with her daughter.   Retired Network engineer    Family History  Problem Relation Age of Onset  . Cancer Mother        breast  . Diabetes Mother   . Cancer Paternal Grandfather     ROS- All systems are reviewed and negative except as per the HPI above  Physical Exam: Vitals:   04/18/17 1142  BP: 112/74  Pulse: (!) 113  Weight: 194 lb 12.8 oz (88.4 kg)  Height: 5\' 1"  (1.549 m)   Wt Readings from Last 3 Encounters:  04/18/17 194 lb 12.8 oz (88.4 kg)  03/18/17 191 lb (86.6 kg)  03/14/17 191 lb (86.6 kg)    Labs: Lab Results  Component Value Date   NA 137 04/18/2017   K 4.6 04/18/2017   CL 106 04/18/2017   CO2 23 04/18/2017   GLUCOSE 96 04/18/2017   BUN 25 (H) 04/18/2017   CREATININE 1.21 (H) 04/18/2017   CALCIUM 9.5 04/18/2017   MG 2.3 09/14/2013   Lab Results  Component Value Date   INR 2.21 04/18/2017   Lab Results  Component Value Date   CHOL 215 (H) 12/07/2016   HDL 78 12/07/2016   LDLCALC 111 (H) 12/07/2016   TRIG 130 12/07/2016     GEN- The patient is well appearing, alert and oriented x 3 today.   Head- normocephalic, atraumatic Eyes-  Sclera clear, conjunctiva pink Ears- hearing intact Oropharynx- clear Neck-  supple, no JVP Lymph- no cervical lymphadenopathy Lungs- Clear to ausculation bilaterally, normal work of breathing Heart-  irregular rate and rhythm, no murmurs, rubs or gallops, PMI not laterally displaced GI- soft, NT, ND, + BS Extremities- no clubbing, cyanosis, or edema MS- no significant deformity or atrophy Skin- no rash or lesion Psych- euthymic mood, full affect Neuro- strength and sensation are intact  EKG-afib with rvr at 113 bpm, qrs int 88 ms, qtc 491 ms Epic records reviewed    Assessment and Plan: 1. Persisitent Afib Unfortunately, successful cardioversion but with ERAF Continue with 240 mg cardizem daily but hold am of cardioversion Reduce amiodarone to one a day starting tomorrow  Will be scheduled for TEE guided cardioversion  INR, cbc bmet today with INR day of cardioversion  F/u with Dr. Rayann Heman  8/22  Geroge Baseman. [place], [facility] [address] [place], [place] [address] [phone]

## 2017-04-19 ENCOUNTER — Telehealth: Payer: Self-pay | Admitting: Family Medicine

## 2017-04-19 NOTE — Telephone Encounter (Signed)
Daughter [name] would like you to call her concerning FL2 form.  Pt is moving into assisted living today, and the home has called because they are trying to get additional information from [place]. [name] would like a call back on mobile

## 2017-04-19 NOTE — Telephone Encounter (Signed)
Paperwork faxed and confirmed. Spoke with daughter.

## 2017-04-22 DIAGNOSIS — R262 Difficulty in walking, not elsewhere classified: Secondary | ICD-10-CM | POA: Diagnosis not present

## 2017-04-22 DIAGNOSIS — R2681 Unsteadiness on feet: Secondary | ICD-10-CM | POA: Diagnosis not present

## 2017-04-22 DIAGNOSIS — M545 Low back pain: Secondary | ICD-10-CM | POA: Diagnosis not present

## 2017-04-22 DIAGNOSIS — Z9181 History of falling: Secondary | ICD-10-CM | POA: Diagnosis not present

## 2017-04-25 ENCOUNTER — Other Ambulatory Visit: Payer: Self-pay

## 2017-04-26 ENCOUNTER — Telehealth: Payer: Self-pay | Admitting: General Practice

## 2017-04-26 DIAGNOSIS — R2681 Unsteadiness on feet: Secondary | ICD-10-CM | POA: Diagnosis not present

## 2017-04-26 DIAGNOSIS — M545 Low back pain: Secondary | ICD-10-CM | POA: Diagnosis not present

## 2017-04-26 DIAGNOSIS — Z9181 History of falling: Secondary | ICD-10-CM | POA: Diagnosis not present

## 2017-04-26 DIAGNOSIS — R262 Difficulty in walking, not elsewhere classified: Secondary | ICD-10-CM | POA: Diagnosis not present

## 2017-04-26 NOTE — Telephone Encounter (Signed)
LMOVM to call Villa Herb, RN.

## 2017-04-27 ENCOUNTER — Telehealth: Payer: Self-pay | Admitting: General Practice

## 2017-04-27 NOTE — Telephone Encounter (Signed)
Patient's daughter calling to inform Villa Herb, RN that patient is having a cardioversion tomorrow, 8/2 and that she will call the office to schedule next coumadin clinic appointment.

## 2017-04-28 ENCOUNTER — Ambulatory Visit (HOSPITAL_COMMUNITY)
Admission: RE | Admit: 2017-04-28 | Discharge: 2017-04-28 | Disposition: A | Discharge status: Home / Self Care | Payer: Medicare Other | Source: Ambulatory Visit | Attending: Cardiovascular Disease | Admitting: Cardiovascular Disease

## 2017-04-28 ENCOUNTER — Encounter (HOSPITAL_COMMUNITY)
Admission: RE | Disposition: A | Discharge status: Home / Self Care | Payer: Self-pay | Source: Ambulatory Visit | Attending: Cardiovascular Disease

## 2017-04-28 ENCOUNTER — Telehealth (HOSPITAL_COMMUNITY): Payer: Self-pay | Admitting: *Deleted

## 2017-04-28 ENCOUNTER — Ambulatory Visit (HOSPITAL_COMMUNITY): Payer: Medicare Other | Admitting: Certified Registered"

## 2017-04-28 ENCOUNTER — Ambulatory Visit (HOSPITAL_COMMUNITY): Payer: Medicare Other

## 2017-04-28 ENCOUNTER — Telehealth: Payer: Self-pay | Admitting: General Practice

## 2017-04-28 ENCOUNTER — Encounter (HOSPITAL_COMMUNITY): Payer: Self-pay | Admitting: Certified Registered"

## 2017-04-28 DIAGNOSIS — I110 Hypertensive heart disease with heart failure: Secondary | ICD-10-CM | POA: Insufficient documentation

## 2017-04-28 DIAGNOSIS — I481 Persistent atrial fibrillation: Secondary | ICD-10-CM | POA: Diagnosis not present

## 2017-04-28 DIAGNOSIS — Z5309 Procedure and treatment not carried out because of other contraindication: Secondary | ICD-10-CM | POA: Diagnosis not present

## 2017-04-28 DIAGNOSIS — I5020 Unspecified systolic (congestive) heart failure: Secondary | ICD-10-CM | POA: Diagnosis not present

## 2017-04-28 DIAGNOSIS — G4733 Obstructive sleep apnea (adult) (pediatric): Secondary | ICD-10-CM | POA: Diagnosis not present

## 2017-04-28 DIAGNOSIS — G2581 Restless legs syndrome: Secondary | ICD-10-CM | POA: Insufficient documentation

## 2017-04-28 DIAGNOSIS — Z79899 Other long term (current) drug therapy: Secondary | ICD-10-CM | POA: Diagnosis not present

## 2017-04-28 DIAGNOSIS — Z7901 Long term (current) use of anticoagulants: Secondary | ICD-10-CM | POA: Diagnosis not present

## 2017-04-28 DIAGNOSIS — E785 Hyperlipidemia, unspecified: Secondary | ICD-10-CM | POA: Diagnosis not present

## 2017-04-28 LAB — PROTIME-INR
INR: 1.55
Prothrombin Time: 18.7 seconds — ABNORMAL HIGH (ref 11.4–15.2)

## 2017-04-28 SURGERY — CANCELLED PROCEDURE

## 2017-04-28 MED ORDER — SODIUM CHLORIDE 0.9 % IV SOLN: INTRAVENOUS | Status: DC | PRN

## 2017-04-28 MED ORDER — SODIUM CHLORIDE 0.9 % IV SOLN
INTRAVENOUS | Status: DC
  Administered 2017-04-28: 500 mL via INTRAVENOUS

## 2017-04-28 NOTE — Progress Notes (Signed)
Late entry: Patient's INR is 1.55 today. She needs to be rescheduled. Dr. Johnsie Cancel notified and instructed patient to take 5 mg of Warfarin tonight and tomorrow night. Then go back to 2.5 mg daily. Dr. Johnsie Cancel contacted afib clinic and will arrange for a patient followup.

## 2017-04-28 NOTE — Telephone Encounter (Signed)
Opened in error

## 2017-04-28 NOTE — Telephone Encounter (Signed)
Left message for daughter to call afib clinic to have cardioversion/tee rescheduled once INR therapeutic. I have sent the INR level from today to coumadin clinic at [facility] that follows her coumadin. Will await call back from daughter to decide further plans.

## 2017-04-28 NOTE — Progress Notes (Signed)
Noted. I will contract patient's daughter, [name].

## 2017-04-28 NOTE — H&P (View-Only) (Signed)
Primary Care Physician: [name], MD Referring Physician: Dr. Drucie Ip Angel Meyer is a 83 y.o. female with a h/o afib on amiodarone that for most part stays in SR but occasionally will need cardioversion. Last one in 2016.She is on amiodarone at 100 mg a day.She had a back surgery two weeks ago receiving cement in the back. After that she starting feeling fatigued and the daughter noted increase in heart rate. She was off warfarin from 5/10 to 5/17 for back surgery.Ekg shows afib at 135 bpm. She was set up fpr TEE cardioversion with increase of amiodarone to 200 mg bid x one week prior to DCCV.  She returns to afib clinic one week s/p cardioversion and unfortunately had return to afib after 4 days in SR. She has RVR today unfortunately cannot add rate control due to soft BP at 90/60. Daughter is concerned because her mother gets very weak, harder to walk unassisted with afib. Continues on warfarin.  She had f/u with Dr. Rayann Heman and was started on cardizem to help with rate control and amiodarone was increased to 200 mg bid and if she reminded in afib, she would need cardioversion after 4 weeks. Unfortunately, she does remain in afib so will schedule for TEE guided cardioversion as she was subtherapeutic last week. Being started on the cardizem did help with rate control and her symptoms. She will be moving to an assisted care facility tomorrow.  Today, she denies symptoms of chest pain,  orthopnea, PND, lower extremity edema, dizziness, presyncope, syncope, or neurologic sequela.  Positive for weakness, fatigue, shortness of breath.The patient is tolerating medications without difficulties and is otherwise without complaint today.   Past Medical History:  Diagnosis Date  . Arthritis   . Diastolic dysfunction    preserved EF,  CHF In setting of afib previously  . Dysrhythmia    afib  . History of blood transfusion   . Hyperlipidemia   . Hypertension   . Incontinence of urine   .  OSA (obstructive sleep apnea)    uses CPAP  . Osteoporosis   . Persistent atrial fibrillation (HCC)    failed on Flecainide and Tikosyn  . Renal calculi   . Restless legs   . Rheumatic fever    age 60  . Systolic heart failure (HCC)    EF 30 to 35% per echo 08/2013   Past Surgical History:  Procedure Laterality Date  . ABDOMINAL HYSTERECTOMY    . BREAST SURGERY     benign biopsy  . CARDIOVERSION N/A 09/10/2013   Procedure: CARDIOVERSION;  Surgeon: Thompson Grayer, MD;  Location: [place];  Service: Cardiovascular;  Laterality: N/A;  . CARDIOVERSION N/A 11/16/2013   Procedure: CARDIOVERSION;  Surgeon: Sueanne Margarita, MD;  Location: [facility] ENDOSCOPY;  Service: Cardiovascular;  Laterality: N/A;  . CARDIOVERSION N/A 08/18/2015   Procedure: CARDIOVERSION;  Surgeon: Sueanne Margarita, MD;  Location: [facility] ENDOSCOPY;  Service: Cardiovascular;  Laterality: N/A;  . CARDIOVERSION N/A 03/07/2017   Procedure: CARDIOVERSION;  Surgeon: Dorothy Spark, MD;  Location: [place];  Service: Cardiovascular;  Laterality: N/A;  . IR RADIOLOGIST EVAL & MGMT  02/04/2017  . IR VERTEBROPLASTY LUMBAR BX INC UNI/BIL INC/INJECT/IMAGING  02/10/2017  . [place] removed  . REPLACEMENT TOTAL KNEE  2005  . SPINE SURGERY     laminectomy 1965, spinal fusion with rod 2005  . TEE WITHOUT CARDIOVERSION N/A 03/07/2017   Procedure: TRANSESOPHAGEAL ECHOCARDIOGRAM (TEE);  Surgeon: Dorothy Spark, MD;  Location: [facility] ENDOSCOPY;  Service: Cardiovascular;  Laterality: N/A;  . TONSILLECTOMY      Current Outpatient Prescriptions  Medication Sig Dispense Refill  . amiodarone (PACERONE) 200 MG tablet Take 1 tablet (200 mg total) by mouth daily. 90 tablet 3  . CARTIA XT 240 MG 24 hr capsule TAKE 1 CAPSULE BY MOUTH EVERY DAY 90 capsule 3  . clobetasol cream (TEMOVATE) 0.93 % Apply 1 application topically 2 (two) times daily as needed. For lichens simplex chronicus    . donepezil (ARICEPT ODT) 10 MG disintegrating  tablet Take 1 tablet (10 mg total) by mouth at bedtime. 90 tablet 2  . escitalopram (LEXAPRO) 10 MG tablet TAKE 1 TABLET EVERY DAY 90 tablet 1  . fluocinonide cream (LIDEX) 8.18 % Apply 1 application topically 2 (two) times daily as needed (CHRONIC RASH).    . furosemide (LASIX) 40 MG tablet Take 1 tablet (40 mg total) by mouth daily. 90 tablet 3  . Melatonin 5 MG TABS Take 10 mg by mouth at bedtime.     . memantine (NAMENDA) 10 MG tablet Take 1 tablet (10 mg total) by mouth 2 (two) times daily. 180 tablet 2  . Misc. Devices (ROLLER [place]) MISC Used as Directed. 1 each 0  . nitroGLYCERIN (NITROSTAT) 0.4 MG SL tablet Place 1 tablet (0.4 mg total) under the tongue every 5 (five) minutes as needed for chest pain. 25 tablet 3  . oxybutynin (DITROPAN-XL) 10 MG 24 hr tablet Take 10 mg by mouth daily.   11  . potassium chloride SA (K-DUR,KLOR-CON) 20 MEQ tablet Take 1 tablet (20 mEq total) by mouth daily. 90 tablet 3  . rOPINIRole (REQUIP) 1 MG tablet Take 1 tablet (1 mg total) by mouth See admin instructions. Take 1 tablet (1 mg) by mouth daily at 4pm and at bedtime (Patient taking differently: Take 1 mg by mouth at bedtime. Take 1 tablet (1 mg) by mouth daily at 4pm and at bedtime) 180 tablet 2  . rosuvastatin (CRESTOR) 40 MG tablet Take 1 tablet (40 mg total) by mouth at bedtime. 90 tablet 3  . spironolactone (ALDACTONE) 25 MG tablet Take 1 tablet (25 mg total) by mouth daily. 90 tablet 3  . trimethoprim (TRIMPEX) 100 MG tablet Take 100 mg by mouth daily.   11  . warfarin (COUMADIN) 2.5 MG tablet Take as directed by anticoagulation clinic. (Patient taking differently: Take 2.5 mg by mouth every evening. Take as directed by anticoagulation clinic.) 105 tablet 1  . diltiazem (CARDIZEM) 30 MG tablet Take 1 tablet (30 mg total) by mouth every 6 (six) hours as needed (AFIB w/pulse >100 bpm until resolved). (Patient not taking: Reported on 04/18/2017) 30 tablet 3   No current facility-administered medications  for this encounter.     Allergies  Allergen Reactions  . Ciprofloxacin Nausea And Vomiting  . Demerol [Meperidine] Nausea And Vomiting  . Sulfa Antibiotics Nausea And Vomiting    And all derivatives -  GI Bleeding  . Sulfacetamide Sodium Nausea And Vomiting    Social History   Social History  . Marital status: Divorced    Spouse name: N/A  . Number of children: N/A  . Years of education: N/A   Occupational History  . Not on file.   Social History Main Topics  . Smoking status: Never Smoker  . Smokeless tobacco: Never Used  . Alcohol use No  . Drug use: No  . Sexual activity: Not Currently  Other Topics Concern  . Not on file   Social History Narrative   Lives in [place] alone.  Spends 4-6 months per year in [place] with her daughter.   Retired Network engineer    Family History  Problem Relation Age of Onset  . Cancer Mother        breast  . Diabetes Mother   . Cancer Paternal Grandfather     ROS- All systems are reviewed and negative except as per the HPI above  Physical Exam: Vitals:   04/18/17 1142  BP: 112/74  Pulse: (!) 113  Weight: 194 lb 12.8 oz (88.4 kg)  Height: 5\' 1"  (1.549 m)   Wt Readings from Last 3 Encounters:  04/18/17 194 lb 12.8 oz (88.4 kg)  03/18/17 191 lb (86.6 kg)  03/14/17 191 lb (86.6 kg)    Labs: Lab Results  Component Value Date   NA 137 04/18/2017   K 4.6 04/18/2017   CL 106 04/18/2017   CO2 23 04/18/2017   GLUCOSE 96 04/18/2017   BUN 25 (H) 04/18/2017   CREATININE 1.21 (H) 04/18/2017   CALCIUM 9.5 04/18/2017   MG 2.3 09/14/2013   Lab Results  Component Value Date   INR 2.21 04/18/2017   Lab Results  Component Value Date   CHOL 215 (H) 12/07/2016   HDL 78 12/07/2016   LDLCALC 111 (H) 12/07/2016   TRIG 130 12/07/2016     GEN- The patient is well appearing, alert and oriented x 3 today.   Head- normocephalic, atraumatic Eyes-  Sclera clear, conjunctiva pink Ears- hearing intact Oropharynx- clear Neck-  supple, no JVP Lymph- no cervical lymphadenopathy Lungs- Clear to ausculation bilaterally, normal work of breathing Heart-  irregular rate and rhythm, no murmurs, rubs or gallops, PMI not laterally displaced GI- soft, NT, ND, + BS Extremities- no clubbing, cyanosis, or edema MS- no significant deformity or atrophy Skin- no rash or lesion Psych- euthymic mood, full affect Neuro- strength and sensation are intact  EKG-afib with rvr at 113 bpm, qrs int 88 ms, qtc 491 ms Epic records reviewed    Assessment and Plan: 1. Persisitent Afib Unfortunately, successful cardioversion but with ERAF Continue with 240 mg cardizem daily but hold am of cardioversion Reduce amiodarone to one a day starting tomorrow  Will be scheduled for TEE guided cardioversion  INR, cbc bmet today with INR day of cardioversion  F/u with Dr. Rayann Heman  8/22  Geroge Baseman. [place], [facility] [address] [place], [place] [address] [phone]

## 2017-04-28 NOTE — Interval H&P Note (Signed)
History and Physical Interval Note:  04/28/2017 12:50 PM  Angel Meyer  has presented today for surgery, with the diagnosis of AFIB  The various methods of treatment have been discussed with the patient and family. After consideration of risks, benefits and other options for treatment, the patient has consented to  Procedure(s): TRANSESOPHAGEAL ECHOCARDIOGRAM (TEE) (N/A) CARDIOVERSION (N/A) as a surgical intervention .  The patient's history has been reviewed, patient examined, no change in status, stable for surgery.  I have reviewed the patient's chart and labs.  Questions were answered to the patient's satisfaction.     Jenkins Rouge

## 2017-04-29 ENCOUNTER — Ambulatory Visit (INDEPENDENT_AMBULATORY_CARE_PROVIDER_SITE_OTHER): Payer: Medicare Other | Admitting: Family Medicine

## 2017-04-29 ENCOUNTER — Encounter: Payer: Self-pay | Admitting: Family Medicine

## 2017-04-29 VITALS — BP 110/70 | HR 71 | Temp 97.4°F | Wt 197.8 lb

## 2017-04-29 DIAGNOSIS — R262 Difficulty in walking, not elsewhere classified: Secondary | ICD-10-CM | POA: Diagnosis not present

## 2017-04-29 DIAGNOSIS — I10 Essential (primary) hypertension: Secondary | ICD-10-CM

## 2017-04-29 DIAGNOSIS — L280 Lichen simplex chronicus: Secondary | ICD-10-CM

## 2017-04-29 DIAGNOSIS — I482 Chronic atrial fibrillation: Secondary | ICD-10-CM | POA: Diagnosis not present

## 2017-04-29 DIAGNOSIS — R2681 Unsteadiness on feet: Secondary | ICD-10-CM | POA: Diagnosis not present

## 2017-04-29 DIAGNOSIS — M6281 Muscle weakness (generalized): Secondary | ICD-10-CM | POA: Diagnosis not present

## 2017-04-29 DIAGNOSIS — R4189 Other symptoms and signs involving cognitive functions and awareness: Secondary | ICD-10-CM

## 2017-04-29 DIAGNOSIS — M545 Low back pain: Secondary | ICD-10-CM | POA: Diagnosis not present

## 2017-04-29 DIAGNOSIS — Z9181 History of falling: Secondary | ICD-10-CM | POA: Diagnosis not present

## 2017-04-29 DIAGNOSIS — M1991 Primary osteoarthritis, unspecified site: Secondary | ICD-10-CM

## 2017-04-29 DIAGNOSIS — R300 Dysuria: Secondary | ICD-10-CM

## 2017-04-29 LAB — POCT URINALYSIS DIPSTICK
BILIRUBIN UA: NEGATIVE
Glucose, UA: NEGATIVE
KETONES UA: NEGATIVE
Leukocytes, UA: NEGATIVE
NITRITE UA: NEGATIVE
PH UA: 6 (ref 5.0–8.0)
Protein, UA: NEGATIVE
RBC UA: NEGATIVE
Spec Grav, UA: 1.025 (ref 1.010–1.025)
Urobilinogen, UA: 0.2 E.U./dL

## 2017-04-29 MED ORDER — METHYLPREDNISOLONE ACETATE 80 MG/ML IJ SUSP
80.0000 mg | Freq: Once | INTRAMUSCULAR | Status: AC
  Administered 2017-04-29: 80 mg via INTRAMUSCULAR

## 2017-04-29 NOTE — Progress Notes (Signed)
Subjective:     Patient ID: Angel Meyer, female   DOB: September 07, 1933, 83 y.o.   MRN: [id number]  HPI Patient recently moved to assisted living and they're in transition with her medications. Seen today for the following multiple issues  Increasing bilateral arm and forearm itching. She is seen dermatologist for this in the past. Biopsy showed lichen simplex chronicus. Patient had steroid injection many months ago which helped tremendously. She is not gotten good relief with topical steroids recently. Patient and her daughter are requesting consideration for intramuscular steroids which gave her several months of relief previously  Couple day history of urine frequency but no burning. They were concerned about possible UTI. No gross hematuria. No flank pain. No fevers or chills. She has history of frequent UTI and had been on trimethoprim for prophylaxis but this was not given couple weeks ago because of her transition to new place of living  Osteoarthritis involving multiple joints and some chronic low back pain. Daughter is concerned that she will not ask for pain medication because of her cognitive impairments. They have current when necessary Tylenol order daughter would like to explore whether we give this scheduled regularly 3 times daily  She has atrial fibrillation was supposed to have TEE yesterday for cardioversion but her INR was 1.55 and so this is been delayed.  Past Medical History:  Diagnosis Date  . Arthritis   . Diastolic dysfunction    preserved EF,  CHF In setting of afib previously  . Dysrhythmia    afib  . History of blood transfusion   . Hyperlipidemia   . Hypertension   . Incontinence of urine   . OSA (obstructive sleep apnea)    does not use CPAP  . Osteoporosis   . Persistent atrial fibrillation (HCC)    failed on Flecainide and Tikosyn  . Renal calculi   . Restless legs   . Rheumatic fever    age 31  . Systolic heart failure (HCC)    EF 30 to 35% per echo  08/2013   Past Surgical History:  Procedure Laterality Date  . ABDOMINAL HYSTERECTOMY    . BREAST SURGERY     benign biopsy  . CARDIOVERSION N/A 09/10/2013   Procedure: CARDIOVERSION;  Surgeon: Thompson Grayer, MD;  Location: [place];  Service: Cardiovascular;  Laterality: N/A;  . CARDIOVERSION N/A 11/16/2013   Procedure: CARDIOVERSION;  Surgeon: Sueanne Margarita, MD;  Location: [facility] ENDOSCOPY;  Service: Cardiovascular;  Laterality: N/A;  . CARDIOVERSION N/A 08/18/2015   Procedure: CARDIOVERSION;  Surgeon: Sueanne Margarita, MD;  Location: [facility] ENDOSCOPY;  Service: Cardiovascular;  Laterality: N/A;  . CARDIOVERSION N/A 03/07/2017   Procedure: CARDIOVERSION;  Surgeon: Dorothy Spark, MD;  Location: [place];  Service: Cardiovascular;  Laterality: N/A;  . IR RADIOLOGIST EVAL & MGMT  02/04/2017  . IR VERTEBROPLASTY LUMBAR BX INC UNI/BIL INC/INJECT/IMAGING  02/10/2017  . [place] removed  . REPLACEMENT TOTAL KNEE  2005  . SPINE SURGERY     laminectomy 1965, spinal fusion with rod 2005  . TEE WITHOUT CARDIOVERSION N/A 03/07/2017   Procedure: TRANSESOPHAGEAL ECHOCARDIOGRAM (TEE);  Surgeon: Dorothy Spark, MD;  Location: [facility] ENDOSCOPY;  Service: Cardiovascular;  Laterality: N/A;  . TONSILLECTOMY      reports that she has never smoked. She has never used smokeless tobacco. She reports that she does not drink alcohol or use drugs. family history includes Cancer in her mother and paternal grandfather; Diabetes in  her mother. Allergies  Allergen Reactions  . Ciprofloxacin Nausea And Vomiting  . Demerol [Meperidine] Nausea And Vomiting  . Sulfa Antibiotics Nausea And Vomiting    And all derivatives -  GI Bleeding  . Sulfacetamide Sodium Nausea And Vomiting      Review of Systems  Constitutional: Negative for chills and fever.  Respiratory: Negative for cough and shortness of breath.   Cardiovascular: Negative for chest pain.  Gastrointestinal: Negative for abdominal pain.   Genitourinary: Positive for frequency. Negative for flank pain and hematuria.  Skin: Positive for rash.       Objective:   Physical Exam  Constitutional: She appears well-developed and well-nourished.  Neck: Neck supple.  Cardiovascular:  Irregular rhythm rate controlled  Pulmonary/Chest: Effort normal and breath sounds normal. No respiratory distress. She has no wheezes. She has no rales.  Musculoskeletal: She exhibits no edema.  Skin: Rash noted.  She has some nonspecific dryness of both forearms. She has a couple of excoriated areas but no signs of secondary infection       Assessment:     #1 urine frequency. Dipstick is completely normal  #2 history of lichen simplex chronicus  #3 osteoarthritis involving multiple joints  #4 hypertension- stable.    Plan:     -Urinalysis is normal. No indication for antibiotics at this time. Start back trimethoprim 100 mg once daily for prophylaxis -Depo-Medrol 80 mg given. Avoid regular use of systemic steroids to avoid potential side effects -Wrote order for Tylenol 650 mg 1 at 8 AM, one at 4 PM, 1 at 8 PM -Patient and family requesting DO NOT RESUSCITATE orders and these were signed today  [name] MD [facility]

## 2017-04-29 NOTE — Telephone Encounter (Signed)
Pt daughter, [name], called back -- unsure of when to reschedule the cardioversion. She's wondering if this would be the time to switch her to NOAC instead of coumadin due INR monitoring she wants to think about everything and let me know what she decides. The nursing facility she moved to on Monday is still giving Amiodarone twice a day -- orders were sent to facility to decrease to Amiodarone 200mg  once a day. Pt has an appt with Dr. Rayann Heman in a few weeks and may wait til this appt to decide next steps.

## 2017-05-01 DIAGNOSIS — M6281 Muscle weakness (generalized): Secondary | ICD-10-CM | POA: Diagnosis not present

## 2017-05-01 DIAGNOSIS — Z9181 History of falling: Secondary | ICD-10-CM | POA: Diagnosis not present

## 2017-05-01 DIAGNOSIS — I482 Chronic atrial fibrillation: Secondary | ICD-10-CM | POA: Diagnosis not present

## 2017-05-01 DIAGNOSIS — M545 Low back pain: Secondary | ICD-10-CM | POA: Diagnosis not present

## 2017-05-01 DIAGNOSIS — R2681 Unsteadiness on feet: Secondary | ICD-10-CM | POA: Diagnosis not present

## 2017-05-01 DIAGNOSIS — R262 Difficulty in walking, not elsewhere classified: Secondary | ICD-10-CM | POA: Diagnosis not present

## 2017-05-02 ENCOUNTER — Encounter: Payer: Self-pay | Admitting: Internal Medicine

## 2017-05-02 ENCOUNTER — Ambulatory Visit (INDEPENDENT_AMBULATORY_CARE_PROVIDER_SITE_OTHER): Payer: Medicare Other | Admitting: General Practice

## 2017-05-02 DIAGNOSIS — I4891 Unspecified atrial fibrillation: Secondary | ICD-10-CM

## 2017-05-02 DIAGNOSIS — Z5181 Encounter for therapeutic drug level monitoring: Secondary | ICD-10-CM

## 2017-05-02 LAB — POCT INR: INR: 3.3

## 2017-05-02 NOTE — Patient Instructions (Signed)
Pre visit review using our clinic review tool, if applicable. No additional management support is needed unless otherwise documented below in the visit note. 

## 2017-05-03 DIAGNOSIS — I482 Chronic atrial fibrillation: Secondary | ICD-10-CM | POA: Diagnosis not present

## 2017-05-03 DIAGNOSIS — R262 Difficulty in walking, not elsewhere classified: Secondary | ICD-10-CM | POA: Diagnosis not present

## 2017-05-03 DIAGNOSIS — M545 Low back pain: Secondary | ICD-10-CM | POA: Diagnosis not present

## 2017-05-03 DIAGNOSIS — M6281 Muscle weakness (generalized): Secondary | ICD-10-CM | POA: Diagnosis not present

## 2017-05-03 DIAGNOSIS — R2681 Unsteadiness on feet: Secondary | ICD-10-CM | POA: Diagnosis not present

## 2017-05-03 DIAGNOSIS — Z9181 History of falling: Secondary | ICD-10-CM | POA: Diagnosis not present

## 2017-05-06 DIAGNOSIS — R2681 Unsteadiness on feet: Secondary | ICD-10-CM | POA: Diagnosis not present

## 2017-05-06 DIAGNOSIS — I482 Chronic atrial fibrillation: Secondary | ICD-10-CM | POA: Diagnosis not present

## 2017-05-06 DIAGNOSIS — Z9181 History of falling: Secondary | ICD-10-CM | POA: Diagnosis not present

## 2017-05-06 DIAGNOSIS — R262 Difficulty in walking, not elsewhere classified: Secondary | ICD-10-CM | POA: Diagnosis not present

## 2017-05-06 DIAGNOSIS — M6281 Muscle weakness (generalized): Secondary | ICD-10-CM | POA: Diagnosis not present

## 2017-05-06 DIAGNOSIS — M545 Low back pain: Secondary | ICD-10-CM | POA: Diagnosis not present

## 2017-05-09 ENCOUNTER — Ambulatory Visit: Payer: Medicare Other

## 2017-05-09 ENCOUNTER — Ambulatory Visit (INDEPENDENT_AMBULATORY_CARE_PROVIDER_SITE_OTHER): Payer: Medicare Other

## 2017-05-09 DIAGNOSIS — I4891 Unspecified atrial fibrillation: Secondary | ICD-10-CM | POA: Diagnosis not present

## 2017-05-09 DIAGNOSIS — Z5181 Encounter for therapeutic drug level monitoring: Secondary | ICD-10-CM

## 2017-05-09 LAB — POCT INR: INR: 2.4

## 2017-05-09 NOTE — Patient Instructions (Signed)
Pre visit review using our clinic review tool, if applicable. No additional management support is needed unless otherwise documented below in the visit note. 

## 2017-05-10 ENCOUNTER — Telehealth (HOSPITAL_COMMUNITY): Payer: Self-pay | Admitting: *Deleted

## 2017-05-10 ENCOUNTER — Other Ambulatory Visit (HOSPITAL_COMMUNITY): Payer: Self-pay | Admitting: *Deleted

## 2017-05-10 DIAGNOSIS — M6281 Muscle weakness (generalized): Secondary | ICD-10-CM | POA: Diagnosis not present

## 2017-05-10 DIAGNOSIS — R2681 Unsteadiness on feet: Secondary | ICD-10-CM | POA: Diagnosis not present

## 2017-05-10 DIAGNOSIS — I480 Paroxysmal atrial fibrillation: Secondary | ICD-10-CM

## 2017-05-10 DIAGNOSIS — M545 Low back pain: Secondary | ICD-10-CM | POA: Diagnosis not present

## 2017-05-10 DIAGNOSIS — Z9181 History of falling: Secondary | ICD-10-CM | POA: Diagnosis not present

## 2017-05-10 DIAGNOSIS — I4819 Other persistent atrial fibrillation: Secondary | ICD-10-CM

## 2017-05-10 DIAGNOSIS — I482 Chronic atrial fibrillation: Secondary | ICD-10-CM | POA: Diagnosis not present

## 2017-05-10 DIAGNOSIS — R262 Difficulty in walking, not elsewhere classified: Secondary | ICD-10-CM | POA: Diagnosis not present

## 2017-05-10 NOTE — Telephone Encounter (Signed)
Patient is scheduled for TEE with Cardioversion on Friday, August 17th. Patient will have INR checked at coumadin clinic at 10am and follow up with our office prior to admission for CBC/BMET.  Patient should be NPO after midnight on 8/17 Medications with a sip of water EXCEPT for morning cardizem (diltiazem) on morning of procedure.

## 2017-05-11 DIAGNOSIS — Z9181 History of falling: Secondary | ICD-10-CM | POA: Diagnosis not present

## 2017-05-11 DIAGNOSIS — M545 Low back pain: Secondary | ICD-10-CM | POA: Diagnosis not present

## 2017-05-11 DIAGNOSIS — R262 Difficulty in walking, not elsewhere classified: Secondary | ICD-10-CM | POA: Diagnosis not present

## 2017-05-11 DIAGNOSIS — I482 Chronic atrial fibrillation: Secondary | ICD-10-CM | POA: Diagnosis not present

## 2017-05-11 DIAGNOSIS — R2681 Unsteadiness on feet: Secondary | ICD-10-CM | POA: Diagnosis not present

## 2017-05-11 DIAGNOSIS — M6281 Muscle weakness (generalized): Secondary | ICD-10-CM | POA: Diagnosis not present

## 2017-05-13 ENCOUNTER — Ambulatory Visit (INDEPENDENT_AMBULATORY_CARE_PROVIDER_SITE_OTHER): Payer: Medicare Other | Admitting: General Practice

## 2017-05-13 ENCOUNTER — Ambulatory Visit (HOSPITAL_COMMUNITY): Payer: Medicare Other | Admitting: Certified Registered Nurse Anesthetist

## 2017-05-13 ENCOUNTER — Telehealth: Payer: Self-pay | Admitting: General Practice

## 2017-05-13 ENCOUNTER — Encounter (HOSPITAL_COMMUNITY): Payer: Self-pay | Admitting: *Deleted

## 2017-05-13 ENCOUNTER — Ambulatory Visit (HOSPITAL_COMMUNITY)
Admission: RE | Admit: 2017-05-13 | Discharge: 2017-05-13 | Disposition: A | Discharge status: Home / Self Care | Payer: Medicare Other | Source: Ambulatory Visit | Attending: Nurse Practitioner | Admitting: Nurse Practitioner

## 2017-05-13 ENCOUNTER — Ambulatory Visit (HOSPITAL_BASED_OUTPATIENT_CLINIC_OR_DEPARTMENT_OTHER): Payer: Medicare Other

## 2017-05-13 ENCOUNTER — Other Ambulatory Visit: Payer: Self-pay

## 2017-05-13 ENCOUNTER — Ambulatory Visit (HOSPITAL_COMMUNITY)
Admission: RE | Admit: 2017-05-13 | Discharge: 2017-05-13 | Disposition: A | Discharge status: Home / Self Care | Payer: Medicare Other | Source: Ambulatory Visit | Attending: Cardiology | Admitting: Cardiology

## 2017-05-13 ENCOUNTER — Encounter (HOSPITAL_COMMUNITY)
Admission: RE | Disposition: A | Discharge status: Home / Self Care | Payer: Self-pay | Source: Ambulatory Visit | Attending: Cardiology

## 2017-05-13 DIAGNOSIS — I481 Persistent atrial fibrillation: Secondary | ICD-10-CM | POA: Diagnosis not present

## 2017-05-13 DIAGNOSIS — I110 Hypertensive heart disease with heart failure: Secondary | ICD-10-CM | POA: Insufficient documentation

## 2017-05-13 DIAGNOSIS — E785 Hyperlipidemia, unspecified: Secondary | ICD-10-CM | POA: Diagnosis not present

## 2017-05-13 DIAGNOSIS — G4733 Obstructive sleep apnea (adult) (pediatric): Secondary | ICD-10-CM | POA: Insufficient documentation

## 2017-05-13 DIAGNOSIS — I5020 Unspecified systolic (congestive) heart failure: Secondary | ICD-10-CM | POA: Diagnosis not present

## 2017-05-13 DIAGNOSIS — Z7901 Long term (current) use of anticoagulants: Secondary | ICD-10-CM | POA: Diagnosis not present

## 2017-05-13 DIAGNOSIS — I340 Nonrheumatic mitral (valve) insufficiency: Secondary | ICD-10-CM

## 2017-05-13 DIAGNOSIS — M1990 Unspecified osteoarthritis, unspecified site: Secondary | ICD-10-CM | POA: Diagnosis not present

## 2017-05-13 DIAGNOSIS — I4891 Unspecified atrial fibrillation: Secondary | ICD-10-CM

## 2017-05-13 DIAGNOSIS — I509 Heart failure, unspecified: Secondary | ICD-10-CM | POA: Diagnosis not present

## 2017-05-13 DIAGNOSIS — Z5181 Encounter for therapeutic drug level monitoring: Secondary | ICD-10-CM | POA: Diagnosis not present

## 2017-05-13 DIAGNOSIS — I4819 Other persistent atrial fibrillation: Secondary | ICD-10-CM

## 2017-05-13 DIAGNOSIS — G2581 Restless legs syndrome: Secondary | ICD-10-CM | POA: Insufficient documentation

## 2017-05-13 HISTORY — PX: CARDIOVERSION: SHX1299

## 2017-05-13 HISTORY — PX: TEE WITHOUT CARDIOVERSION: SHX5443

## 2017-05-13 LAB — CBC
HCT: 45.8 % (ref 36.0–46.0)
Hemoglobin: 15.3 g/dL — ABNORMAL HIGH (ref 12.0–15.0)
MCH: 33.2 pg (ref 26.0–34.0)
MCHC: 33.4 g/dL (ref 30.0–36.0)
MCV: 99.3 fL (ref 78.0–100.0)
PLATELETS: 180 10*3/uL (ref 150–400)
RBC: 4.61 MIL/uL (ref 3.87–5.11)
RDW: 14.9 % (ref 11.5–15.5)
WBC: 6.9 10*3/uL (ref 4.0–10.5)

## 2017-05-13 LAB — BASIC METABOLIC PANEL
Anion gap: 12 (ref 5–15)
BUN: 24 mg/dL — AB (ref 6–20)
CHLORIDE: 104 mmol/L (ref 101–111)
CO2: 21 mmol/L — AB (ref 22–32)
Calcium: 9.6 mg/dL (ref 8.9–10.3)
Creatinine, Ser: 1.25 mg/dL — ABNORMAL HIGH (ref 0.44–1.00)
GFR calc Af Amer: 45 mL/min — ABNORMAL LOW (ref >60)
GFR calc non Af Amer: 39 mL/min — ABNORMAL LOW (ref >60)
GLUCOSE: 95 mg/dL (ref 65–99)
POTASSIUM: 4.4 mmol/L (ref 3.5–5.1)
Sodium: 137 mmol/L (ref 135–145)

## 2017-05-13 LAB — POCT INR: INR: 2.4

## 2017-05-13 SURGERY — CARDIOVERSION
Anesthesia: Monitor Anesthesia Care

## 2017-05-13 MED ORDER — LIDOCAINE 2% (20 MG/ML) 5 ML SYRINGE
INTRAMUSCULAR | Status: DC | PRN
  Administered 2017-05-13: 20 mg via INTRAVENOUS

## 2017-05-13 MED ORDER — SODIUM CHLORIDE 0.9 % IV SOLN: INTRAVENOUS | Status: DC

## 2017-05-13 MED ORDER — PROPOFOL 10 MG/ML IV BOLUS
INTRAVENOUS | Status: DC | PRN
  Administered 2017-05-13: 25 mg via INTRAVENOUS
  Administered 2017-05-13: 20 mg via INTRAVENOUS

## 2017-05-13 MED ORDER — PROPOFOL 500 MG/50ML IV EMUL
INTRAVENOUS | Status: DC | PRN
  Administered 2017-05-13: 50 ug/kg/min via INTRAVENOUS

## 2017-05-13 MED ORDER — LACTATED RINGERS IV SOLN
INTRAVENOUS | Status: DC | PRN
  Administered 2017-05-13: 13:00:00 via INTRAVENOUS

## 2017-05-13 MED ORDER — BUTAMBEN-TETRACAINE-BENZOCAINE 2-2-14 % EX AERO
INHALATION_SPRAY | CUTANEOUS | Status: DC | PRN
  Administered 2017-05-13: 2 via TOPICAL

## 2017-05-13 NOTE — Progress Notes (Signed)
I have reviewed and agree with the plan. 

## 2017-05-13 NOTE — Progress Notes (Signed)
    Transesophageal Echocardiogram Note  Angel Meyer [id number] 1932-10-26  Procedure: Transesophageal Echocardiogram Indications: Atrial fibrillation   Procedure Details Consent: Obtained Time Out: Verified patient identification, verified procedure, site/side was marked, verified correct patient position, special equipment/implants available, Radiology Safety Procedures followed,  medications/allergies/relevent history reviewed, required imaging and test results available.  Performed  Medications:  Pt sedated by anesthesia with total of 168 mg IV diprovan.  Normal LV function; severe LAE; no LAA thrombus.  Pt subsequently underwent DCCV with 120 J to sinus rhythm; no immediate complications; continue coumadin and fu with Dr Rayann Heman.   Complications: No apparent complications Patient did tolerate procedure well.  Kirk Ruths, MD

## 2017-05-13 NOTE — Telephone Encounter (Signed)
I saw patient this morning at the [facility] office and her INR was 2.4.  Patient to be cardioverted today.

## 2017-05-13 NOTE — Anesthesia Postprocedure Evaluation (Signed)
Anesthesia Post Note  Patient: Angel Meyer  Procedure(s) Performed: Procedure(s) (LRB): CARDIOVERSION (N/A) TRANSESOPHAGEAL ECHOCARDIOGRAM (TEE) (N/A)     Patient location during evaluation: PACU Anesthesia Type: MAC Level of consciousness: awake and alert Pain management: pain level controlled Vital Signs Assessment: post-procedure vital signs reviewed and stable Respiratory status: spontaneous breathing, nonlabored ventilation and respiratory function stable Cardiovascular status: stable and blood pressure returned to baseline Anesthetic complications: no    Last Vitals:  Vitals:   05/13/17 1350 05/13/17 1400  BP: 115/64   Pulse: (!) 58 (!) 59  Resp: 14 14  Temp:    SpO2: 93% 95%    Last Pain:  Vitals:   05/13/17 1332  TempSrc: Oral  PainSc:                  [name]

## 2017-05-13 NOTE — Anesthesia Procedure Notes (Signed)
Procedure Name: MAC Date/Time: 05/13/2017 1:03 PM Performed by: Everlean Cherry A Pre-anesthesia Checklist: Patient identified, Emergency Drugs available, Suction available and Patient being monitored Patient Re-evaluated:Patient Re-evaluated prior to induction Oxygen Delivery Method: Nasal cannula

## 2017-05-13 NOTE — Patient Instructions (Signed)
Pre visit review using our clinic review tool, if applicable. No additional management support is needed unless otherwise documented below in the visit note. 

## 2017-05-13 NOTE — Anesthesia Preprocedure Evaluation (Signed)
Anesthesia Evaluation  Patient identified by MRN, date of birth, ID band Patient awake    Reviewed: Allergy & Precautions, H&P , NPO status , Patient's Chart, lab work & pertinent test results  Airway Mallampati: II  TM Distance: >3 FB Neck ROM: Full    Dental no notable dental hx. (+) Teeth Intact, Dental Advisory Given   Pulmonary sleep apnea and Continuous Positive Airway Pressure Ventilation ,    Pulmonary exam normal breath sounds clear to auscultation       Cardiovascular hypertension, Pt. on medications +CHF  + dysrhythmias Atrial Fibrillation  Rhythm:Irregular Rate:Normal     Neuro/Psych negative neurological ROS  negative psych ROS   GI/Hepatic negative GI ROS, Neg liver ROS,   Endo/Other  negative endocrine ROS  Renal/GU negative Renal ROS  negative genitourinary   Musculoskeletal  (+) Arthritis , Osteoarthritis,    Abdominal   Peds  Hematology negative hematology ROS (+)   Anesthesia Other Findings   Reproductive/Obstetrics negative OB ROS                             Anesthesia Physical  Anesthesia Plan  ASA: III  Anesthesia Plan: MAC   Post-op Pain Management:    Induction: Intravenous  PONV Risk Score and Plan: 2 and Ondansetron and Midazolam  Airway Management Planned: Nasal Cannula  Additional Equipment:   Intra-op Plan:   Post-operative Plan:   Informed Consent: I have reviewed the patients History and Physical, chart, labs and discussed the procedure including the risks, benefits and alternatives for the proposed anesthesia with the patient or authorized representative who has indicated his/her understanding and acceptance.   Dental advisory given  Plan Discussed with: CRNA  Anesthesia Plan Comments:         Anesthesia Quick Evaluation

## 2017-05-13 NOTE — H&P (Signed)
ATRIAL FIB OFFICE VISIT   04/18/2017 [place] [facility]  Sherran Needs, NP  Cardiology   Persistent atrial fibrillation [facility])  Dx   Atrial Fibrillation; Referred by [name], MD  Reason for Visit   Additional Documentation   Vitals:   BP 112/74   Pulse  113   Ht 5\' 1"  (1.549 m)   Wt 88.4 kg (194 lb 12.8 oz)   BMI 36.81 kg/m   BSA 1.95 m   Flowsheets:   Custom Formula Data,   MEWS Score,   Anthropometrics     Encounter Info:   Billing Info,   History,   Allergies,   Detailed Report     All Notes   Progress Notes by Warden Fillers, RN at 04/18/2017 11:59 PM   Author: Warden Fillers, RN Author Type: Registered Nurse Filed: 04/28/2017 4:58 PM  Note Status: Signed Cosign: Cosign Not Required Date of Service: 04/18/2017 11:59 PM  Editor: Warden Fillers, RN (Registered Nurse)    Noted. I will contract patient's daughter, [name].    Progress Notes by Sherran Needs, NP at 04/18/2017 12:42 PM   Author: Sherran Needs, NP Author Type: Nurse Practitioner Filed: 04/18/2017 1:37 PM  Note Status: Addendum Cosign: Cosign Not Required Date of Service: 04/18/2017 12:42 PM  Editor: Sherran Needs, NP (Nurse Practitioner)  Prior Versions: 1. Sherran Needs, NP (Nurse Practitioner) at 04/18/2017 1:23 PM - Signed  Expand All Collapse All     Primary Care Physician: [name], MD Referring Physician: Dr. Drucie Ip Menard is a 83 y.o. female with a h/o afib on amiodarone that for most part stays in SR but occasionally will need cardioversion. Last one in 2016.She is on amiodarone at 100 mg a day.She had a back surgery two weeks ago receiving cement in the back. After that she starting feeling fatigued and the daughter noted increase in heart rate. She was off warfarin from 5/10 to 5/17 for back surgery.Ekg shows afib at 135 bpm. She was set up fpr TEE cardioversion with increase of amiodarone to 200 mg bid x one week prior to  DCCV.  She returns to afib clinic one week s/p cardioversion and unfortunately had return to afib after 4 days in SR. She has RVR today unfortunately cannot add rate control due to soft BP at 90/60. Daughter is concerned because her mother gets very weak, harder to walk unassisted with afib. Continues on warfarin.  She had f/u with Dr. Rayann Heman and was started on cardizem to help with rate control and amiodarone was increased to 200 mg bid and if she reminded in afib, she would need cardioversion after 4 weeks. Unfortunately, she does remain in afib so will schedule for TEE guided cardioversion as she was subtherapeutic last week. Being started on the cardizem did help with rate control and her symptoms. She will be moving to an assisted care facility tomorrow.  Today, she denies symptoms of chest pain,  orthopnea, PND, lower extremity edema, dizziness, presyncope, syncope, or neurologic sequela.  Positive for weakness, fatigue, shortness of breath.The patient is tolerating medications without difficulties and is otherwise without complaint today.       Past Medical History:  Diagnosis Date  . Arthritis   . Diastolic dysfunction    preserved EF,  CHF In setting of afib previously  . Dysrhythmia    afib  . History of blood transfusion   . Hyperlipidemia   .  Hypertension   . Incontinence of urine   . OSA (obstructive sleep apnea)    uses CPAP  . Osteoporosis   . Persistent atrial fibrillation (HCC)    failed on Flecainide and Tikosyn  . Renal calculi   . Restless legs   . Rheumatic fever    age 29  . Systolic heart failure (HCC)    EF 30 to 35% per echo 08/2013        Past Surgical History:  Procedure Laterality Date  . ABDOMINAL HYSTERECTOMY    . BREAST SURGERY     benign biopsy  . CARDIOVERSION N/A 09/10/2013   Procedure: CARDIOVERSION;  Surgeon: Thompson Grayer, MD;  Location: [place];  Service: Cardiovascular;  Laterality: N/A;  . CARDIOVERSION N/A  11/16/2013   Procedure: CARDIOVERSION;  Surgeon: Sueanne Margarita, MD;  Location: [facility] ENDOSCOPY;  Service: Cardiovascular;  Laterality: N/A;  . CARDIOVERSION N/A 08/18/2015   Procedure: CARDIOVERSION;  Surgeon: Sueanne Margarita, MD;  Location: [facility] ENDOSCOPY;  Service: Cardiovascular;  Laterality: N/A;  . CARDIOVERSION N/A 03/07/2017   Procedure: CARDIOVERSION;  Surgeon: Dorothy Spark, MD;  Location: [place];  Service: Cardiovascular;  Laterality: N/A;  . IR RADIOLOGIST EVAL & MGMT  02/04/2017  . IR VERTEBROPLASTY LUMBAR BX INC UNI/BIL INC/INJECT/IMAGING  02/10/2017  . [place] removed  . REPLACEMENT TOTAL KNEE  2005  . SPINE SURGERY     laminectomy 1965, spinal fusion with rod 2005  . TEE WITHOUT CARDIOVERSION N/A 03/07/2017   Procedure: TRANSESOPHAGEAL ECHOCARDIOGRAM (TEE);  Surgeon: Dorothy Spark, MD;  Location: [facility] ENDOSCOPY;  Service: Cardiovascular;  Laterality: N/A;  . TONSILLECTOMY            Current Outpatient Prescriptions  Medication Sig Dispense Refill  . amiodarone (PACERONE) 200 MG tablet Take 1 tablet (200 mg total) by mouth daily. 90 tablet 3  . CARTIA XT 240 MG 24 hr capsule TAKE 1 CAPSULE BY MOUTH EVERY DAY 90 capsule 3  . clobetasol cream (TEMOVATE) 1.61 % Apply 1 application topically 2 (two) times daily as needed. For lichens simplex chronicus    . donepezil (ARICEPT ODT) 10 MG disintegrating tablet Take 1 tablet (10 mg total) by mouth at bedtime. 90 tablet 2  . escitalopram (LEXAPRO) 10 MG tablet TAKE 1 TABLET EVERY DAY 90 tablet 1  . fluocinonide cream (LIDEX) 0.96 % Apply 1 application topically 2 (two) times daily as needed (CHRONIC RASH).    . furosemide (LASIX) 40 MG tablet Take 1 tablet (40 mg total) by mouth daily. 90 tablet 3  . Melatonin 5 MG TABS Take 10 mg by mouth at bedtime.     . memantine (NAMENDA) 10 MG tablet Take 1 tablet (10 mg total) by mouth 2 (two) times daily. 180 tablet 2  . Misc. Devices (ROLLER  [place]) MISC Used as Directed. 1 each 0  . nitroGLYCERIN (NITROSTAT) 0.4 MG SL tablet Place 1 tablet (0.4 mg total) under the tongue every 5 (five) minutes as needed for chest pain. 25 tablet 3  . oxybutynin (DITROPAN-XL) 10 MG 24 hr tablet Take 10 mg by mouth daily.   11  . potassium chloride SA (K-DUR,KLOR-CON) 20 MEQ tablet Take 1 tablet (20 mEq total) by mouth daily. 90 tablet 3  . rOPINIRole (REQUIP) 1 MG tablet Take 1 tablet (1 mg total) by mouth See admin instructions. Take 1 tablet (1 mg) by mouth daily at 4pm and at bedtime (Patient taking differently: Take 1  mg by mouth at bedtime. Take 1 tablet (1 mg) by mouth daily at 4pm and at bedtime) 180 tablet 2  . rosuvastatin (CRESTOR) 40 MG tablet Take 1 tablet (40 mg total) by mouth at bedtime. 90 tablet 3  . spironolactone (ALDACTONE) 25 MG tablet Take 1 tablet (25 mg total) by mouth daily. 90 tablet 3  . trimethoprim (TRIMPEX) 100 MG tablet Take 100 mg by mouth daily.   11  . warfarin (COUMADIN) 2.5 MG tablet Take as directed by anticoagulation clinic. (Patient taking differently: Take 2.5 mg by mouth every evening. Take as directed by anticoagulation clinic.) 105 tablet 1  . diltiazem (CARDIZEM) 30 MG tablet Take 1 tablet (30 mg total) by mouth every 6 (six) hours as needed (AFIB w/pulse >100 bpm until resolved). (Patient not taking: Reported on 04/18/2017) 30 tablet 3   No current facility-administered medications for this encounter.          Allergies  Allergen Reactions  . Ciprofloxacin Nausea And Vomiting  . Demerol [Meperidine] Nausea And Vomiting  . Sulfa Antibiotics Nausea And Vomiting    And all derivatives -  GI Bleeding  . Sulfacetamide Sodium Nausea And Vomiting    Social History        Social History  . Marital status: Divorced    Spouse name: N/A  . Number of children: N/A  . Years of education: N/A      Occupational History  . Not on file.       Social History Main Topics  . Smoking status:  Never Smoker  . Smokeless tobacco: Never Used  . Alcohol use No  . Drug use: No  . Sexual activity: Not Currently       Other Topics Concern  . Not on file      Social History Narrative   Lives in [place] alone.  Spends 4-6 months per year in [place] with her daughter.   Retired Network engineer         Family History  Problem Relation Age of Onset  . Cancer Mother        breast  . Diabetes Mother   . Cancer Paternal Grandfather     ROS- All systems are reviewed and negative except as per the HPI above  Physical Exam:    Vitals:   04/18/17 1142  BP: 112/74  Pulse: (!) 113  Weight: 194 lb 12.8 oz (88.4 kg)  Height: 5\' 1"  (1.549 m)      Wt Readings from Last 3 Encounters:  04/18/17 194 lb 12.8 oz (88.4 kg)  03/18/17 191 lb (86.6 kg)  03/14/17 191 lb (86.6 kg)    Labs: Recent Labs       Lab Results  Component Value Date   NA 137 04/18/2017   K 4.6 04/18/2017   CL 106 04/18/2017   CO2 23 04/18/2017   GLUCOSE 96 04/18/2017   BUN 25 (H) 04/18/2017   CREATININE 1.21 (H) 04/18/2017   CALCIUM 9.5 04/18/2017   MG 2.3 09/14/2013     Recent Labs       Lab Results  Component Value Date   INR 2.21 04/18/2017     Recent Labs       Lab Results  Component Value Date   CHOL 215 (H) 12/07/2016   HDL 78 12/07/2016   LDLCALC 111 (H) 12/07/2016   TRIG 130 12/07/2016       GEN- The patient is well appearing, alert and oriented x 3 today.  Head- normocephalic, atraumatic Eyes-  Sclera clear, conjunctiva pink Ears- hearing intact Oropharynx- clear Neck- supple, no JVP Lymph- no cervical lymphadenopathy Lungs- Clear to ausculation bilaterally, normal work of breathing Heart-  irregular rate and rhythm, no murmurs, rubs or gallops, PMI not laterally displaced GI- soft, NT, ND, + BS Extremities- no clubbing, cyanosis, or edema MS- no significant deformity or atrophy Skin- no rash or lesion Psych- euthymic mood, full  affect Neuro- strength and sensation are intact  EKG-afib with rvr at 113 bpm, qrs int 88 ms, qtc 491 ms Epic records reviewed    Assessment and Plan: 1. Persisitent Afib Unfortunately, successful cardioversion but with ERAF Continue with 240 mg cardizem daily but hold am of cardioversion Reduce amiodarone to one a day starting tomorrow  Will be scheduled for TEE guided cardioversion  INR, cbc bmet today with INR day of cardioversion  F/u with Dr. Rayann Heman  8/22  Geroge Baseman. [facility] [address] [place], [place] [address] [phone]     For TEE/DCCV; INR therapeutic; no changes. Kirk Ruths, MD

## 2017-05-13 NOTE — Discharge Instructions (Signed)
TEE  YOU HAD AN CARDIAC PROCEDURE TODAY: Refer to the procedure report and other information in the discharge instructions given to you for any specific questions about what was found during the examination. If this information does not answer your questions, please call [facility] office at [phone] to clarify.   DIET: Your first meal following the procedure should be a light meal and then it is ok to progress to your normal diet. A half-sandwich or bowl of soup is an example of a good first meal. Heavy or fried foods are harder to digest and may make you feel nauseous or bloated. Drink plenty of fluids but you should avoid alcoholic beverages for 24 hours. If you had a esophageal dilation, please see attached instructions for diet.   ACTIVITY: Your care partner should take you home directly after the procedure. You should plan to take it easy, moving slowly for the rest of the day. You can resume normal activity the day after the procedure however YOU SHOULD NOT DRIVE, use power tools, machinery or perform tasks that involve climbing or major physical exertion for 24 hours (because of the sedation medicines used during the test).   SYMPTOMS TO REPORT IMMEDIATELY: A cardiologist can be reached at any hour. Please call [phone] for any of the following symptoms:  Vomiting of blood or coffee ground material  New, significant abdominal pain  New, significant chest pain or pain under the shoulder blades  Painful or persistently difficult swallowing  New shortness of breath  Black, tarry-looking or red, bloody stools  FOLLOW UP:  Please also call with any specific questions about appointments or follow up tests.  Monitored Anesthesia Care Anesthesia is a term that refers to techniques, procedures, and medicines that help a person stay safe and comfortable during a medical procedure. Monitored anesthesia care, or sedation, is one type of anesthesia. Your anesthesia specialist may  recommend sedation if you will be having a procedure that does not require you to be unconscious, such as:  Cataract surgery.  A dental procedure.  A biopsy.  A colonoscopy.  During the procedure, you may receive a medicine to help you relax (sedative). There are three levels of sedation:  Mild sedation. At this level, you may feel awake and relaxed. You will be able to follow directions.  Moderate sedation. At this level, you will be sleepy. You may not remember the procedure.  Deep sedation. At this level, you will be asleep. You will not remember the procedure.  The more medicine you are given, the deeper your level of sedation will be. Depending on how you respond to the procedure, the anesthesia specialist may change your level of sedation or the type of anesthesia to fit your needs. An anesthesia specialist will monitor you closely during the procedure. Let your health care provider know about:  Any allergies you have.  All medicines you are taking, including vitamins, herbs, eye drops, creams, and over-the-counter medicines.  Any use of steroids (by mouth or as a cream).  Any problems you or family members have had with sedatives and anesthetic medicines.  Any blood disorders you have.  Any surgeries you have had.  Any medical conditions you have, such as sleep apnea.  Whether you are pregnant or may be pregnant.  Any use of cigarettes, alcohol, or street drugs. What are the risks? Generally, this is a safe procedure. However, problems may occur, including:  Getting too much medicine (oversedation).  Nausea.  Allergic reaction to medicines.  Trouble breathing. If this happens, a breathing tube may be used to help with breathing. It will be removed when you are awake and breathing on your own.  Heart trouble.  Lung trouble.  Before the procedure Staying hydrated Follow instructions from your health care provider about hydration, which may include:  Up to  2 hours before the procedure - you may continue to drink clear liquids, such as water, clear fruit juice, black coffee, and plain tea.  Eating and drinking restrictions Follow instructions from your health care provider about eating and drinking, which may include:  8 hours before the procedure - stop eating heavy meals or foods such as meat, fried foods, or fatty foods.  6 hours before the procedure - stop eating light meals or foods, such as toast or cereal.  6 hours before the procedure - stop drinking milk or drinks that contain milk.  2 hours before the procedure - stop drinking clear liquids.  Medicines Ask your health care provider about:  Changing or stopping your regular medicines. This is especially important if you are taking diabetes medicines or blood thinners.  Taking medicines such as aspirin and ibuprofen. These medicines can thin your blood. Do not take these medicines before your procedure if your health care provider instructs you not to.  Tests and exams  You will have a physical exam.  You may have blood tests done to show: ? How well your kidneys and liver are working. ? How well your blood can clot.  General instructions  Plan to have someone take you home from the hospital or clinic.  If you will be going home right after the procedure, plan to have someone with you for 24 hours.  What happens during the procedure?  Your blood pressure, heart rate, breathing, level of pain and overall condition will be monitored.  An IV tube will be inserted into one of your veins.  Your anesthesia specialist will give you medicines as needed to keep you comfortable during the procedure. This may mean changing the level of sedation.  The procedure will be performed. After the procedure  Your blood pressure, heart rate, breathing rate, and blood oxygen level will be monitored until the medicines you were given have worn off.  Do not drive for 24 hours if you  received a sedative.  You may: ? Feel sleepy, clumsy, or nauseous. ? Feel forgetful about what happened after the procedure. ? Have a sore throat if you had a breathing tube during the procedure. ? Vomit. This information is not intended to replace advice given to you by your health care provider. Make sure you discuss any questions you have with your health care provider. Document Released: 06/09/2005 Document Revised: 02/20/2016 Document Reviewed: 01/04/2016 Elsevier Interactive Patient Education  [employer].

## 2017-05-13 NOTE — Transfer of Care (Signed)
Immediate Anesthesia Transfer of Care Note  Patient: Angel Meyer  Procedure(s) Performed: Procedure(s): CARDIOVERSION (N/A) TRANSESOPHAGEAL ECHOCARDIOGRAM (TEE) (N/A)  Patient Location: Endoscopy Unit  Anesthesia Type:General  Level of Consciousness: awake, alert , oriented and patient cooperative  Airway & Oxygen Therapy: Patient Spontanous Breathing and Patient connected to nasal cannula oxygen  Post-op Assessment: Report given to RN and Post -op Vital signs reviewed and stable  Post vital signs: Reviewed , stable Last Vitals:  Vitals:   05/13/17 1154 05/13/17 1332  BP: 134/88 (!) 92/56  Pulse: 99 (!) 57  Resp: 20 15  Temp: 36.6 C (!) 36.4 C  SpO2: 96% 95%    Last Pain:  Vitals:   05/13/17 1332  TempSrc: Oral  PainSc:          Complications: No apparent anesthesia complications

## 2017-05-16 ENCOUNTER — Ambulatory Visit: Payer: Medicare Other

## 2017-05-16 ENCOUNTER — Encounter (HOSPITAL_COMMUNITY): Payer: Self-pay | Admitting: Cardiology

## 2017-05-16 DIAGNOSIS — R2681 Unsteadiness on feet: Secondary | ICD-10-CM | POA: Diagnosis not present

## 2017-05-16 DIAGNOSIS — M6281 Muscle weakness (generalized): Secondary | ICD-10-CM | POA: Diagnosis not present

## 2017-05-16 DIAGNOSIS — R262 Difficulty in walking, not elsewhere classified: Secondary | ICD-10-CM | POA: Diagnosis not present

## 2017-05-16 DIAGNOSIS — Z9181 History of falling: Secondary | ICD-10-CM | POA: Diagnosis not present

## 2017-05-16 DIAGNOSIS — I482 Chronic atrial fibrillation: Secondary | ICD-10-CM | POA: Diagnosis not present

## 2017-05-16 DIAGNOSIS — M545 Low back pain: Secondary | ICD-10-CM | POA: Diagnosis not present

## 2017-05-17 DIAGNOSIS — R2681 Unsteadiness on feet: Secondary | ICD-10-CM | POA: Diagnosis not present

## 2017-05-17 DIAGNOSIS — I482 Chronic atrial fibrillation: Secondary | ICD-10-CM | POA: Diagnosis not present

## 2017-05-17 DIAGNOSIS — Z9181 History of falling: Secondary | ICD-10-CM | POA: Diagnosis not present

## 2017-05-17 DIAGNOSIS — R262 Difficulty in walking, not elsewhere classified: Secondary | ICD-10-CM | POA: Diagnosis not present

## 2017-05-17 DIAGNOSIS — M545 Low back pain: Secondary | ICD-10-CM | POA: Diagnosis not present

## 2017-05-17 DIAGNOSIS — M6281 Muscle weakness (generalized): Secondary | ICD-10-CM | POA: Diagnosis not present

## 2017-05-18 ENCOUNTER — Encounter: Payer: Self-pay | Admitting: Internal Medicine

## 2017-05-18 ENCOUNTER — Ambulatory Visit (INDEPENDENT_AMBULATORY_CARE_PROVIDER_SITE_OTHER): Payer: Medicare Other | Admitting: Internal Medicine

## 2017-05-18 VITALS — BP 126/76 | HR 106 | Ht 67.0 in | Wt 197.0 lb

## 2017-05-18 DIAGNOSIS — I481 Persistent atrial fibrillation: Secondary | ICD-10-CM

## 2017-05-18 DIAGNOSIS — R262 Difficulty in walking, not elsewhere classified: Secondary | ICD-10-CM | POA: Diagnosis not present

## 2017-05-18 DIAGNOSIS — M545 Low back pain: Secondary | ICD-10-CM | POA: Diagnosis not present

## 2017-05-18 DIAGNOSIS — M6281 Muscle weakness (generalized): Secondary | ICD-10-CM | POA: Diagnosis not present

## 2017-05-18 DIAGNOSIS — I480 Paroxysmal atrial fibrillation: Secondary | ICD-10-CM

## 2017-05-18 DIAGNOSIS — Z9181 History of falling: Secondary | ICD-10-CM | POA: Diagnosis not present

## 2017-05-18 DIAGNOSIS — G4733 Obstructive sleep apnea (adult) (pediatric): Secondary | ICD-10-CM | POA: Diagnosis not present

## 2017-05-18 DIAGNOSIS — I4819 Other persistent atrial fibrillation: Secondary | ICD-10-CM

## 2017-05-18 DIAGNOSIS — I482 Chronic atrial fibrillation: Secondary | ICD-10-CM | POA: Diagnosis not present

## 2017-05-18 DIAGNOSIS — R2681 Unsteadiness on feet: Secondary | ICD-10-CM | POA: Diagnosis not present

## 2017-05-18 MED ORDER — DILTIAZEM HCL ER COATED BEADS 360 MG PO CP24: 360.0000 mg | ORAL_CAPSULE | Freq: Every day | ORAL | 3 refills | Status: DC

## 2017-05-18 NOTE — Progress Notes (Signed)
PCP: [name], MD Primary EP: Dr Rayann Heman  Angel Meyer is a 83 y.o. female who presents today for routine electrophysiology followup.  Since last being seen in our clinic, the patient reports doing reasonably well.  She was noted to have severe LA enlargement on TEE 05/13/17.  She underwent cardioversion but has already returned to afib.  She has fatigue.  V rates are elevated.  Today, she denies symptoms of palpitations, chest pain, shortness of breath,  lower extremity edema, dizziness, presyncope, or syncope.  The patient is otherwise without complaint today.   Past Medical History:  Diagnosis Date  . Arthritis   . Diastolic dysfunction    preserved EF,  CHF In setting of afib previously  . History of blood transfusion   . Hyperlipidemia   . Hypertension   . Incontinence of urine   . OSA (obstructive sleep apnea)    does not use CPAP  . Osteoporosis   . Persistent atrial fibrillation (HCC)    failed on Flecainide, Tikosyn, and amiodarone  . Renal calculi   . Restless legs   . Rheumatic fever    age 75  . Systolic heart failure (HCC)    EF 30 to 35% per echo 08/2013,  subsequently has normalized   Past Surgical History:  Procedure Laterality Date  . ABDOMINAL HYSTERECTOMY    . BREAST SURGERY     benign biopsy  . CARDIOVERSION N/A 09/10/2013   Procedure: CARDIOVERSION;  Surgeon: Thompson Grayer, MD;  Location: [place];  Service: Cardiovascular;  Laterality: N/A;  . CARDIOVERSION N/A 11/16/2013   Procedure: CARDIOVERSION;  Surgeon: Sueanne Margarita, MD;  Location: [facility] ENDOSCOPY;  Service: Cardiovascular;  Laterality: N/A;  . CARDIOVERSION N/A 08/18/2015   Procedure: CARDIOVERSION;  Surgeon: Sueanne Margarita, MD;  Location: [facility] ENDOSCOPY;  Service: Cardiovascular;  Laterality: N/A;  . CARDIOVERSION N/A 03/07/2017   Procedure: CARDIOVERSION;  Surgeon: Dorothy Spark, MD;  Location: [facility] ENDOSCOPY;  Service: Cardiovascular;  Laterality: N/A;  . CARDIOVERSION N/A 05/13/2017   Procedure: CARDIOVERSION;  Surgeon: Lelon Perla, MD;  Location: [facility] ENDOSCOPY;  Service: Cardiovascular;  Laterality: N/A;  . IR RADIOLOGIST EVAL & MGMT  02/04/2017  . IR VERTEBROPLASTY LUMBAR BX INC UNI/BIL INC/INJECT/IMAGING  02/10/2017  . [place] removed  . REPLACEMENT TOTAL KNEE  2005  . SPINE SURGERY     laminectomy 1965, spinal fusion with rod 2005  . TEE WITHOUT CARDIOVERSION N/A 03/07/2017   Procedure: TRANSESOPHAGEAL ECHOCARDIOGRAM (TEE);  Surgeon: Dorothy Spark, MD;  Location: [facility] ENDOSCOPY;  Service: Cardiovascular;  Laterality: N/A;  . TEE WITHOUT CARDIOVERSION N/A 05/13/2017   Procedure: TRANSESOPHAGEAL ECHOCARDIOGRAM (TEE);  Surgeon: Lelon Perla, MD;  Location: [facility] ENDOSCOPY;  Service: Cardiovascular;  Laterality: N/A;  . TONSILLECTOMY      ROS- all systems are reviewed and negatives except as per HPI above  Current Outpatient Prescriptions  Medication Sig Dispense Refill  . acetaminophen (TYLENOL 8 HOUR) 650 MG CR tablet 650 mg. Take one tab by mouth at 8 am, one tab at 4 pm, one at 8 pm    . amiodarone (PACERONE) 200 MG tablet Take 1 tablet (200 mg total) by mouth daily. 90 tablet 3  . CARTIA XT 240 MG 24 hr capsule TAKE 1 CAPSULE BY MOUTH EVERY DAY 90 capsule 3  . clobetasol cream (TEMOVATE) 1.88 % Apply 1 application topically 2 (two) times daily as needed. For lichens simplex chronicus    .  donepezil (ARICEPT ODT) 10 MG disintegrating tablet Take 1 tablet (10 mg total) by mouth at bedtime. 90 tablet 2  . escitalopram (LEXAPRO) 10 MG tablet TAKE 1 TABLET EVERY DAY 90 tablet 1  . fluocinonide cream (LIDEX) 5.57 % Apply 1 application topically 2 (two) times daily as needed (CHRONIC RASH).    . furosemide (LASIX) 40 MG tablet Take 1 tablet (40 mg total) by mouth daily. 90 tablet 3  . Melatonin 5 MG TABS Take 10 mg by mouth at bedtime.     . memantine (NAMENDA) 10 MG tablet Take 1 tablet (10 mg total) by mouth 2 (two) times daily. 180 tablet 2   . Misc. Devices (ROLLER [place]) MISC Used as Directed. 1 each 0  . Multiple Vitamins-Minerals (PRESERVISION AREDS) CAPS Take 1 capsule by mouth 2 (two) times daily.    . nitroGLYCERIN (NITROSTAT) 0.4 MG SL tablet Place 1 tablet (0.4 mg total) under the tongue every 5 (five) minutes as needed for chest pain. 25 tablet 3  . oxybutynin (DITROPAN-XL) 10 MG 24 hr tablet Take 10 mg by mouth daily.   11  . potassium chloride SA (K-DUR,KLOR-CON) 20 MEQ tablet Take 1 tablet (20 mEq total) by mouth daily. 90 tablet 3  . rOPINIRole (REQUIP) 1 MG tablet Take 1 tablet (1 mg total) by mouth See admin instructions. Take 1 tablet (1 mg) by mouth daily at 4pm and at bedtime (Patient taking differently: Take 1 mg by mouth at bedtime. Take 1 tablet (1 mg) by mouth daily at 4pm and at bedtime) 180 tablet 2  . rosuvastatin (CRESTOR) 40 MG tablet Take 1 tablet (40 mg total) by mouth at bedtime. 90 tablet 3  . spironolactone (ALDACTONE) 25 MG tablet Take 1 tablet (25 mg total) by mouth daily. 90 tablet 3  . trimethoprim (TRIMPEX) 100 MG tablet Take 100 mg by mouth daily.   11  . warfarin (COUMADIN) 2.5 MG tablet Take as directed by anticoagulation clinic. (Patient taking differently: Take 2.5 mg by mouth every evening. Take as directed by anticoagulation clinic.) 105 tablet 1   No current facility-administered medications for this visit.     Physical Exam: Vitals:   05/18/17 1403  BP: 126/76  Pulse: (!) 106  Weight: 197 lb (89.4 kg)  Height: 5\' 7"  (1.702 m)    GEN- The patient is well appearing, alert and oriented x 3 today.   Head- normocephalic, atraumatic Eyes-  Sclera clear, conjunctiva pink Ears- hearing intact Oropharynx- clear Lungs- Clear to ausculation bilaterally, normal work of breathing Heart- tachycardic irregular rhythm, no murmurs, rubs or gallops, PMI not laterally displaced GI- soft, NT, ND, + BS Extremities- no clubbing, cyanosis, or edema  EKG tracing ordered today is personally  reviewed and shows afib, V rate 106 bpm  Assessment and Plan:  1. Persistent afib with rhematic heart disease V rates remain elevated. She has failed cardioversion despite amiodarone. Given her rheumatic heart disease, I suspect that our ability to maintain sinus long term is low.  She also has severe LA enlargement.  She would like to avoid procedures.  Not a good candidate for ablation. Stop amiodarone Increase diltiazem to 360 mg daily for rate control Return to see Butch Penny in the AF clinic in 3 weeks for further rate control (could increase diltiazem 240mg  BID at that time if needed) Return to see me in 6-8 weeks. Additional options would include AV nodal ablation or consideration of afib ablation with low success rates.  Given advanced age,  MAZE would not be a good option for her.  2. OSA Stable No change required today  3. HTN Stable No change required today  4. Obesity Body mass index is 30.85 kg/m. Improved  Very complicated patient with refractory arrhythmia.  A high level of decision making was required for this encounter.  Thompson Grayer MD, [facility] 05/18/2017 2:47 PM

## 2017-05-18 NOTE — Patient Instructions (Addendum)
Medication Instructions:  Your physician has recommended you make the following change in your medication:  1) Stop Amiodarone 2) Increase Diltiazem to 360 mg daily   Labwork: None ordered   Testing/Procedures: None ordered   Follow-Up: Your physician recommends that you schedule a follow-up appointment in: 3 weeks with Roderic Palau, NP and 8 weeks with Dr Rayann Heman  Thank you for choosing [facility]!!     Janan Halter, RN [phone]

## 2017-05-19 DIAGNOSIS — M6281 Muscle weakness (generalized): Secondary | ICD-10-CM | POA: Diagnosis not present

## 2017-05-19 DIAGNOSIS — R262 Difficulty in walking, not elsewhere classified: Secondary | ICD-10-CM | POA: Diagnosis not present

## 2017-05-19 DIAGNOSIS — R2681 Unsteadiness on feet: Secondary | ICD-10-CM | POA: Diagnosis not present

## 2017-05-19 DIAGNOSIS — Z9181 History of falling: Secondary | ICD-10-CM | POA: Diagnosis not present

## 2017-05-19 DIAGNOSIS — I482 Chronic atrial fibrillation: Secondary | ICD-10-CM | POA: Diagnosis not present

## 2017-05-19 DIAGNOSIS — M545 Low back pain: Secondary | ICD-10-CM | POA: Diagnosis not present

## 2017-05-20 ENCOUNTER — Encounter: Payer: Self-pay | Admitting: Family Medicine

## 2017-05-20 DIAGNOSIS — M6281 Muscle weakness (generalized): Secondary | ICD-10-CM | POA: Diagnosis not present

## 2017-05-20 DIAGNOSIS — R262 Difficulty in walking, not elsewhere classified: Secondary | ICD-10-CM | POA: Diagnosis not present

## 2017-05-20 DIAGNOSIS — R2681 Unsteadiness on feet: Secondary | ICD-10-CM | POA: Diagnosis not present

## 2017-05-20 DIAGNOSIS — M545 Low back pain: Secondary | ICD-10-CM | POA: Diagnosis not present

## 2017-05-20 DIAGNOSIS — I482 Chronic atrial fibrillation: Secondary | ICD-10-CM | POA: Diagnosis not present

## 2017-05-20 DIAGNOSIS — Z9181 History of falling: Secondary | ICD-10-CM | POA: Diagnosis not present

## 2017-05-21 DIAGNOSIS — M545 Low back pain: Secondary | ICD-10-CM | POA: Diagnosis not present

## 2017-05-21 DIAGNOSIS — Z9181 History of falling: Secondary | ICD-10-CM | POA: Diagnosis not present

## 2017-05-21 DIAGNOSIS — R262 Difficulty in walking, not elsewhere classified: Secondary | ICD-10-CM | POA: Diagnosis not present

## 2017-05-21 DIAGNOSIS — I482 Chronic atrial fibrillation: Secondary | ICD-10-CM | POA: Diagnosis not present

## 2017-05-21 DIAGNOSIS — R2681 Unsteadiness on feet: Secondary | ICD-10-CM | POA: Diagnosis not present

## 2017-05-21 DIAGNOSIS — M6281 Muscle weakness (generalized): Secondary | ICD-10-CM | POA: Diagnosis not present

## 2017-05-23 DIAGNOSIS — I482 Chronic atrial fibrillation: Secondary | ICD-10-CM | POA: Diagnosis not present

## 2017-05-23 DIAGNOSIS — Z9181 History of falling: Secondary | ICD-10-CM | POA: Diagnosis not present

## 2017-05-23 DIAGNOSIS — M545 Low back pain: Secondary | ICD-10-CM | POA: Diagnosis not present

## 2017-05-23 DIAGNOSIS — M6281 Muscle weakness (generalized): Secondary | ICD-10-CM | POA: Diagnosis not present

## 2017-05-23 DIAGNOSIS — R2681 Unsteadiness on feet: Secondary | ICD-10-CM | POA: Diagnosis not present

## 2017-05-23 DIAGNOSIS — R262 Difficulty in walking, not elsewhere classified: Secondary | ICD-10-CM | POA: Diagnosis not present

## 2017-05-24 DIAGNOSIS — R262 Difficulty in walking, not elsewhere classified: Secondary | ICD-10-CM | POA: Diagnosis not present

## 2017-05-24 DIAGNOSIS — Z9181 History of falling: Secondary | ICD-10-CM | POA: Diagnosis not present

## 2017-05-24 DIAGNOSIS — M6281 Muscle weakness (generalized): Secondary | ICD-10-CM | POA: Diagnosis not present

## 2017-05-24 DIAGNOSIS — I482 Chronic atrial fibrillation: Secondary | ICD-10-CM | POA: Diagnosis not present

## 2017-05-24 DIAGNOSIS — M545 Low back pain: Secondary | ICD-10-CM | POA: Diagnosis not present

## 2017-05-24 DIAGNOSIS — R2681 Unsteadiness on feet: Secondary | ICD-10-CM | POA: Diagnosis not present

## 2017-05-25 DIAGNOSIS — M545 Low back pain: Secondary | ICD-10-CM | POA: Diagnosis not present

## 2017-05-25 DIAGNOSIS — R262 Difficulty in walking, not elsewhere classified: Secondary | ICD-10-CM | POA: Diagnosis not present

## 2017-05-25 DIAGNOSIS — Z9181 History of falling: Secondary | ICD-10-CM | POA: Diagnosis not present

## 2017-05-25 DIAGNOSIS — R2681 Unsteadiness on feet: Secondary | ICD-10-CM | POA: Diagnosis not present

## 2017-05-25 DIAGNOSIS — M6281 Muscle weakness (generalized): Secondary | ICD-10-CM | POA: Diagnosis not present

## 2017-05-25 DIAGNOSIS — I482 Chronic atrial fibrillation: Secondary | ICD-10-CM | POA: Diagnosis not present

## 2017-05-26 DIAGNOSIS — M461 Sacroiliitis, not elsewhere classified: Secondary | ICD-10-CM | POA: Diagnosis not present

## 2017-05-27 DIAGNOSIS — R262 Difficulty in walking, not elsewhere classified: Secondary | ICD-10-CM | POA: Diagnosis not present

## 2017-05-27 DIAGNOSIS — R2681 Unsteadiness on feet: Secondary | ICD-10-CM | POA: Diagnosis not present

## 2017-05-27 DIAGNOSIS — M545 Low back pain: Secondary | ICD-10-CM | POA: Diagnosis not present

## 2017-05-27 DIAGNOSIS — I482 Chronic atrial fibrillation: Secondary | ICD-10-CM | POA: Diagnosis not present

## 2017-05-27 DIAGNOSIS — M6281 Muscle weakness (generalized): Secondary | ICD-10-CM | POA: Diagnosis not present

## 2017-05-27 DIAGNOSIS — Z9181 History of falling: Secondary | ICD-10-CM | POA: Diagnosis not present

## 2017-05-31 DIAGNOSIS — R262 Difficulty in walking, not elsewhere classified: Secondary | ICD-10-CM | POA: Diagnosis not present

## 2017-05-31 DIAGNOSIS — M6281 Muscle weakness (generalized): Secondary | ICD-10-CM | POA: Diagnosis not present

## 2017-05-31 DIAGNOSIS — R2681 Unsteadiness on feet: Secondary | ICD-10-CM | POA: Diagnosis not present

## 2017-05-31 DIAGNOSIS — I482 Chronic atrial fibrillation: Secondary | ICD-10-CM | POA: Diagnosis not present

## 2017-05-31 DIAGNOSIS — M545 Low back pain: Secondary | ICD-10-CM | POA: Diagnosis not present

## 2017-05-31 DIAGNOSIS — Z9181 History of falling: Secondary | ICD-10-CM | POA: Diagnosis not present

## 2017-06-01 DIAGNOSIS — R2681 Unsteadiness on feet: Secondary | ICD-10-CM | POA: Diagnosis not present

## 2017-06-01 DIAGNOSIS — M6281 Muscle weakness (generalized): Secondary | ICD-10-CM | POA: Diagnosis not present

## 2017-06-01 DIAGNOSIS — R262 Difficulty in walking, not elsewhere classified: Secondary | ICD-10-CM | POA: Diagnosis not present

## 2017-06-01 DIAGNOSIS — I482 Chronic atrial fibrillation: Secondary | ICD-10-CM | POA: Diagnosis not present

## 2017-06-01 DIAGNOSIS — M545 Low back pain: Secondary | ICD-10-CM | POA: Diagnosis not present

## 2017-06-01 DIAGNOSIS — Z9181 History of falling: Secondary | ICD-10-CM | POA: Diagnosis not present

## 2017-06-02 DIAGNOSIS — M461 Sacroiliitis, not elsewhere classified: Secondary | ICD-10-CM | POA: Diagnosis not present

## 2017-06-03 DIAGNOSIS — Z9181 History of falling: Secondary | ICD-10-CM | POA: Diagnosis not present

## 2017-06-03 DIAGNOSIS — I482 Chronic atrial fibrillation: Secondary | ICD-10-CM | POA: Diagnosis not present

## 2017-06-03 DIAGNOSIS — M545 Low back pain: Secondary | ICD-10-CM | POA: Diagnosis not present

## 2017-06-03 DIAGNOSIS — R262 Difficulty in walking, not elsewhere classified: Secondary | ICD-10-CM | POA: Diagnosis not present

## 2017-06-03 DIAGNOSIS — M6281 Muscle weakness (generalized): Secondary | ICD-10-CM | POA: Diagnosis not present

## 2017-06-03 DIAGNOSIS — R2681 Unsteadiness on feet: Secondary | ICD-10-CM | POA: Diagnosis not present

## 2017-06-06 DIAGNOSIS — I482 Chronic atrial fibrillation: Secondary | ICD-10-CM | POA: Diagnosis not present

## 2017-06-06 DIAGNOSIS — M6281 Muscle weakness (generalized): Secondary | ICD-10-CM | POA: Diagnosis not present

## 2017-06-06 DIAGNOSIS — R2681 Unsteadiness on feet: Secondary | ICD-10-CM | POA: Diagnosis not present

## 2017-06-06 DIAGNOSIS — R262 Difficulty in walking, not elsewhere classified: Secondary | ICD-10-CM | POA: Diagnosis not present

## 2017-06-06 DIAGNOSIS — Z9181 History of falling: Secondary | ICD-10-CM | POA: Diagnosis not present

## 2017-06-06 DIAGNOSIS — M545 Low back pain: Secondary | ICD-10-CM | POA: Diagnosis not present

## 2017-06-07 DIAGNOSIS — M545 Low back pain: Secondary | ICD-10-CM | POA: Diagnosis not present

## 2017-06-07 DIAGNOSIS — R2681 Unsteadiness on feet: Secondary | ICD-10-CM | POA: Diagnosis not present

## 2017-06-07 DIAGNOSIS — Z9181 History of falling: Secondary | ICD-10-CM | POA: Diagnosis not present

## 2017-06-07 DIAGNOSIS — M6281 Muscle weakness (generalized): Secondary | ICD-10-CM | POA: Diagnosis not present

## 2017-06-07 DIAGNOSIS — I482 Chronic atrial fibrillation: Secondary | ICD-10-CM | POA: Diagnosis not present

## 2017-06-07 DIAGNOSIS — R262 Difficulty in walking, not elsewhere classified: Secondary | ICD-10-CM | POA: Diagnosis not present

## 2017-06-08 ENCOUNTER — Other Ambulatory Visit: Payer: Self-pay | Admitting: Internal Medicine

## 2017-06-08 ENCOUNTER — Ambulatory Visit (INDEPENDENT_AMBULATORY_CARE_PROVIDER_SITE_OTHER): Payer: Medicare Other | Admitting: General Practice

## 2017-06-08 ENCOUNTER — Ambulatory Visit (HOSPITAL_COMMUNITY)
Admission: RE | Admit: 2017-06-08 | Discharge: 2017-06-08 | Disposition: A | Discharge status: Home / Self Care | Payer: Medicare Other | Source: Ambulatory Visit | Attending: Nurse Practitioner | Admitting: Nurse Practitioner

## 2017-06-08 ENCOUNTER — Encounter (HOSPITAL_COMMUNITY): Payer: Self-pay | Admitting: Nurse Practitioner

## 2017-06-08 VITALS — BP 124/72 | HR 89 | Ht 67.0 in | Wt 193.4 lb

## 2017-06-08 DIAGNOSIS — Z5181 Encounter for therapeutic drug level monitoring: Secondary | ICD-10-CM | POA: Diagnosis not present

## 2017-06-08 DIAGNOSIS — I4891 Unspecified atrial fibrillation: Secondary | ICD-10-CM

## 2017-06-08 DIAGNOSIS — G4733 Obstructive sleep apnea (adult) (pediatric): Secondary | ICD-10-CM | POA: Diagnosis not present

## 2017-06-08 DIAGNOSIS — I5020 Unspecified systolic (congestive) heart failure: Secondary | ICD-10-CM | POA: Insufficient documentation

## 2017-06-08 DIAGNOSIS — Z79899 Other long term (current) drug therapy: Secondary | ICD-10-CM | POA: Diagnosis not present

## 2017-06-08 DIAGNOSIS — E785 Hyperlipidemia, unspecified: Secondary | ICD-10-CM | POA: Diagnosis not present

## 2017-06-08 DIAGNOSIS — Z96659 Presence of unspecified artificial knee joint: Secondary | ICD-10-CM | POA: Insufficient documentation

## 2017-06-08 DIAGNOSIS — Z7901 Long term (current) use of anticoagulants: Secondary | ICD-10-CM

## 2017-06-08 DIAGNOSIS — I481 Persistent atrial fibrillation: Secondary | ICD-10-CM | POA: Diagnosis not present

## 2017-06-08 DIAGNOSIS — G2581 Restless legs syndrome: Secondary | ICD-10-CM | POA: Diagnosis not present

## 2017-06-08 DIAGNOSIS — I110 Hypertensive heart disease with heart failure: Secondary | ICD-10-CM | POA: Insufficient documentation

## 2017-06-08 DIAGNOSIS — I4819 Other persistent atrial fibrillation: Secondary | ICD-10-CM

## 2017-06-08 LAB — POCT INR: INR: 1.9

## 2017-06-08 NOTE — Progress Notes (Signed)
Primary Care Physician: [name], MD Referring Physician: Dr. Drucie Ip Killough is a 83 y.o. female with a h/o afib on amiodarone that for most part stays in SR but occasionally will need cardioversion. Last one in 2016.She is on amiodarone at 100 mg a day.She had a back surgery two weeks ago receiving cement in the back. After that she starting feeling fatigued and the daughter noted increase in heart rate. She was off warfarin from 5/10 to 5/17 for back surgery.Ekg shows afib at 135 bpm. She was set up fpr TEE cardioversion with increase of amiodarone to 200 mg bid x one week prior to DCCV.  She returns to afib clinic one week s/p cardioversion and unfortunately had return to afib after 4 days in SR. She has RVR today unfortunately cannot add rate control due to soft BP at 90/60. Daughter is concerned because her mother gets very weak, harder to walk unassisted with afib. Continues on warfarin.  She had f/u with Dr. Rayann Heman and was started on cardizem to help with rate control and amiodarone was increased to 200 mg bid and if she reminded in afib, she would need cardioversion after 4 weeks. Unfortunately, she does remain in afib so will schedule for TEE guided cardioversion as she was subtherapeutic last week. Being started on the cardizem did help with rate control and her symptoms. She will be moving to an assisted care facility tomorrow.  F/u in afib clinic 9/12, she unfortunately failed amiodarone and cardioversion. She saw Dr. Rayann Heman, 8/22, and decision was made to stop amiodarone and increase Cardizem to see if fatigue and shortness of breath would improve with better rate control. If not, he would consider AV nodal ablation and PPM. She is rate controlled but does not  feel any better. Has noted some LLE edema with higher dose of cardizem.  Today, she denies symptoms of chest pain,  orthopnea, PND, lower extremity edema, dizziness, presyncope, syncope, or neurologic sequela.   Positive for weakness, fatigue, shortness of breath.The patient is tolerating medications without difficulties and is otherwise without complaint today.   Past Medical History:  Diagnosis Date  . Arthritis   . Diastolic dysfunction    preserved EF,  CHF In setting of afib previously  . History of blood transfusion   . Hyperlipidemia   . Hypertension   . Incontinence of urine   . OSA (obstructive sleep apnea)    does not use CPAP  . Osteoporosis   . Persistent atrial fibrillation (HCC)    failed on Flecainide, Tikosyn, and amiodarone  . Renal calculi   . Restless legs   . Rheumatic fever    age 59  . Systolic heart failure (HCC)    EF 30 to 35% per echo 08/2013,  subsequently has normalized   Past Surgical History:  Procedure Laterality Date  . ABDOMINAL HYSTERECTOMY    . BREAST SURGERY     benign biopsy  . CARDIOVERSION N/A 09/10/2013   Procedure: CARDIOVERSION;  Surgeon: Thompson Grayer, MD;  Location: [place];  Service: Cardiovascular;  Laterality: N/A;  . CARDIOVERSION N/A 11/16/2013   Procedure: CARDIOVERSION;  Surgeon: Sueanne Margarita, MD;  Location: [facility] ENDOSCOPY;  Service: Cardiovascular;  Laterality: N/A;  . CARDIOVERSION N/A 08/18/2015   Procedure: CARDIOVERSION;  Surgeon: Sueanne Margarita, MD;  Location: [facility] ENDOSCOPY;  Service: Cardiovascular;  Laterality: N/A;  . CARDIOVERSION N/A 03/07/2017   Procedure: CARDIOVERSION;  Surgeon: Dorothy Spark, MD;  Location: [place];  Service: Cardiovascular;  Laterality: N/A;  . CARDIOVERSION N/A 05/13/2017   Procedure: CARDIOVERSION;  Surgeon: Lelon Perla, MD;  Location: [facility] ENDOSCOPY;  Service: Cardiovascular;  Laterality: N/A;  . IR RADIOLOGIST EVAL & MGMT  02/04/2017  . IR VERTEBROPLASTY LUMBAR BX INC UNI/BIL INC/INJECT/IMAGING  02/10/2017  . [place] removed  . REPLACEMENT TOTAL KNEE  2005  . SPINE SURGERY     laminectomy 1965, spinal fusion with rod 2005  . TEE WITHOUT CARDIOVERSION N/A 03/07/2017    Procedure: TRANSESOPHAGEAL ECHOCARDIOGRAM (TEE);  Surgeon: Dorothy Spark, MD;  Location: [facility] ENDOSCOPY;  Service: Cardiovascular;  Laterality: N/A;  . TEE WITHOUT CARDIOVERSION N/A 05/13/2017   Procedure: TRANSESOPHAGEAL ECHOCARDIOGRAM (TEE);  Surgeon: Lelon Perla, MD;  Location: [facility] ENDOSCOPY;  Service: Cardiovascular;  Laterality: N/A;  . TONSILLECTOMY      Current Outpatient Prescriptions  Medication Sig Dispense Refill  . acetaminophen (TYLENOL) 325 MG tablet Take 650 mg by mouth 2 (two) times daily after a meal.    . clobetasol cream (TEMOVATE) 3.71 % Apply 1 application topically 2 (two) times daily as needed. For lichens simplex chronicus    . diltiazem (CARDIZEM CD) 360 MG 24 hr capsule Take 1 capsule (360 mg total) by mouth daily. 90 capsule 3  . donepezil (ARICEPT ODT) 10 MG disintegrating tablet Take 1 tablet (10 mg total) by mouth at bedtime. 90 tablet 2  . escitalopram (LEXAPRO) 10 MG tablet TAKE 1 TABLET EVERY DAY 90 tablet 1  . fluocinonide cream (LIDEX) 0.62 % Apply 1 application topically 2 (two) times daily as needed (CHRONIC RASH).    . furosemide (LASIX) 40 MG tablet Take 1 tablet (40 mg total) by mouth daily. 90 tablet 3  . Melatonin 5 MG TABS Take 10 mg by mouth at bedtime.     . memantine (NAMENDA) 10 MG tablet Take 1 tablet (10 mg total) by mouth 2 (two) times daily. 180 tablet 2  . Misc. Devices (ROLLER [place]selmo) MISC Used as Directed. 1 each 0  . Multiple Vitamins-Minerals (PRESERVISION AREDS) CAPS Take 1 capsule by mouth 2 (two) times daily.    . nitroGLYCERIN (NITROSTAT) 0.4 MG SL tablet Place 1 tablet (0.4 mg total) under the tongue every 5 (five) minutes as needed for chest pain. 25 tablet 3  . oxybutynin (DITROPAN-XL) 10 MG 24 hr tablet Take 10 mg by mouth daily.   11  . potassium chloride SA (K-DUR,KLOR-CON) 20 MEQ tablet Take 1 tablet (20 mEq total) by mouth daily. 90 tablet 3  . rOPINIRole (REQUIP) 1 MG tablet Take 1 tablet (1 mg total) by mouth See admin  instructions. Take 1 tablet (1 mg) by mouth daily at 4pm and at bedtime (Patient taking differently: Take 1 mg by mouth at bedtime. Take 1 tablet (1 mg) by mouth daily at 4pm and at bedtime 8 pm) 180 tablet 2  . rosuvastatin (CRESTOR) 40 MG tablet Take 1 tablet (40 mg total) by mouth at bedtime. 90 tablet 3  . spironolactone (ALDACTONE) 25 MG tablet Take 1 tablet (25 mg total) by mouth daily. 90 tablet 3  . traMADol (ULTRAM) 50 MG tablet Take by mouth every 6 (six) hours as needed.    . trimethoprim (TRIMPEX) 100 MG tablet Take 100 mg by mouth daily.   11  . warfarin (COUMADIN) 2.5 MG tablet Take as directed by anticoagulation clinic. (Patient taking differently: Take 2.5 mg by mouth every evening. Take as directed by anticoagulation clinic.)  105 tablet 1   No current facility-administered medications for this encounter.     Allergies  Allergen Reactions  . Ciprofloxacin Nausea And Vomiting  . Demerol [Meperidine] Nausea And Vomiting  . Sulfa Antibiotics Nausea And Vomiting    And all derivatives -  GI Bleeding  . Sulfacetamide Sodium Nausea And Vomiting    Social History   Social History  . Marital status: Divorced    Spouse name: N/A  . Number of children: N/A  . Years of education: N/A   Occupational History  . Not on file.   Social History Main Topics  . Smoking status: Never Smoker  . Smokeless tobacco: Never Used  . Alcohol use No  . Drug use: No  . Sexual activity: Not Currently   Other Topics Concern  . Not on file   Social History Narrative   Lives in [place] alone.  Spends 4-6 months per year in [place] with her daughter.   Retired Network engineer    Family History  Problem Relation Age of Onset  . Cancer Mother        breast  . Diabetes Mother   . Cancer Paternal Grandfather     ROS- All systems are reviewed and negative except as per the HPI above  Physical Exam: Vitals:   06/08/17 1351  BP: 124/72  Pulse: 89  Weight: 193 lb 6.4 oz (87.7 kg)    Height: 5\' 7"  (1.702 m)   Wt Readings from Last 3 Encounters:  06/08/17 193 lb 6.4 oz (87.7 kg)  05/18/17 197 lb (89.4 kg)  05/13/17 197 lb (89.4 kg)    Labs: Lab Results  Component Value Date   NA 137 05/13/2017   K 4.4 05/13/2017   CL 104 05/13/2017   CO2 21 (L) 05/13/2017   GLUCOSE 95 05/13/2017   BUN 24 (H) 05/13/2017   CREATININE 1.25 (H) 05/13/2017   CALCIUM 9.6 05/13/2017   MG 2.3 09/14/2013   Lab Results  Component Value Date   INR 1.9 06/08/2017   Lab Results  Component Value Date   CHOL 215 (H) 12/07/2016   HDL 78 12/07/2016   LDLCALC 111 (H) 12/07/2016   TRIG 130 12/07/2016     GEN- The patient is well appearing, alert and oriented x 3 today.   Head- normocephalic, atraumatic Eyes-  Sclera clear, conjunctiva pink Ears- hearing intact Oropharynx- clear Neck- supple, no JVP Lymph- no cervical lymphadenopathy Lungs- Clear to ausculation bilaterally, normal work of breathing Heart-  irregular rate and rhythm, no murmurs, rubs or gallops, PMI not laterally displaced GI- soft, NT, ND, + BS Extremities- no clubbing, cyanosis, or mild pedal edema MS- no significant deformity or atrophy Skin- no rash or lesion Psych- euthymic mood, full affect Neuro- strength and sensation are intact  EKG-afib with v rate of 89 bpm, qrs int 84 ms, qtc 455 ms  Epic records reviewed   Assessment and Plan: 1. Persisitent Afib Unfortunately, has failed amiodarone/cardioversion  Continue with 360 mg cardizem daily , she is better rate controlled but does not feel any better Continue wafarin  F/u with Dr. Rayann Heman to  further discuss av nodal ablation/PPM 10/15  Butch Penny C. Takoda Janowiak, [facility] [address] [place], [place] [address] [phone]

## 2017-06-08 NOTE — Patient Instructions (Signed)
Pre visit review using our clinic review tool, if applicable. No additional management support is needed unless otherwise documented below in the visit note. 

## 2017-06-09 DIAGNOSIS — M6281 Muscle weakness (generalized): Secondary | ICD-10-CM | POA: Diagnosis not present

## 2017-06-09 DIAGNOSIS — R2681 Unsteadiness on feet: Secondary | ICD-10-CM | POA: Diagnosis not present

## 2017-06-09 DIAGNOSIS — Z9181 History of falling: Secondary | ICD-10-CM | POA: Diagnosis not present

## 2017-06-09 DIAGNOSIS — M545 Low back pain: Secondary | ICD-10-CM | POA: Diagnosis not present

## 2017-06-09 DIAGNOSIS — I482 Chronic atrial fibrillation: Secondary | ICD-10-CM | POA: Diagnosis not present

## 2017-06-09 DIAGNOSIS — R262 Difficulty in walking, not elsewhere classified: Secondary | ICD-10-CM | POA: Diagnosis not present

## 2017-06-13 ENCOUNTER — Other Ambulatory Visit: Payer: Self-pay | Admitting: Internal Medicine

## 2017-06-13 DIAGNOSIS — I482 Chronic atrial fibrillation: Secondary | ICD-10-CM | POA: Diagnosis not present

## 2017-06-13 DIAGNOSIS — Z9181 History of falling: Secondary | ICD-10-CM | POA: Diagnosis not present

## 2017-06-13 DIAGNOSIS — M545 Low back pain: Secondary | ICD-10-CM | POA: Diagnosis not present

## 2017-06-13 DIAGNOSIS — M6281 Muscle weakness (generalized): Secondary | ICD-10-CM | POA: Diagnosis not present

## 2017-06-13 DIAGNOSIS — R2681 Unsteadiness on feet: Secondary | ICD-10-CM | POA: Diagnosis not present

## 2017-06-13 DIAGNOSIS — R262 Difficulty in walking, not elsewhere classified: Secondary | ICD-10-CM | POA: Diagnosis not present

## 2017-06-14 DIAGNOSIS — R262 Difficulty in walking, not elsewhere classified: Secondary | ICD-10-CM | POA: Diagnosis not present

## 2017-06-14 DIAGNOSIS — M6281 Muscle weakness (generalized): Secondary | ICD-10-CM | POA: Diagnosis not present

## 2017-06-14 DIAGNOSIS — R2681 Unsteadiness on feet: Secondary | ICD-10-CM | POA: Diagnosis not present

## 2017-06-14 DIAGNOSIS — I482 Chronic atrial fibrillation: Secondary | ICD-10-CM | POA: Diagnosis not present

## 2017-06-14 DIAGNOSIS — Z9181 History of falling: Secondary | ICD-10-CM | POA: Diagnosis not present

## 2017-06-14 DIAGNOSIS — M545 Low back pain: Secondary | ICD-10-CM | POA: Diagnosis not present

## 2017-06-15 DIAGNOSIS — M545 Low back pain: Secondary | ICD-10-CM | POA: Diagnosis not present

## 2017-06-15 DIAGNOSIS — Z9181 History of falling: Secondary | ICD-10-CM | POA: Diagnosis not present

## 2017-06-15 DIAGNOSIS — R2681 Unsteadiness on feet: Secondary | ICD-10-CM | POA: Diagnosis not present

## 2017-06-15 DIAGNOSIS — I482 Chronic atrial fibrillation: Secondary | ICD-10-CM | POA: Diagnosis not present

## 2017-06-15 DIAGNOSIS — R262 Difficulty in walking, not elsewhere classified: Secondary | ICD-10-CM | POA: Diagnosis not present

## 2017-06-15 DIAGNOSIS — M6281 Muscle weakness (generalized): Secondary | ICD-10-CM | POA: Diagnosis not present

## 2017-06-16 ENCOUNTER — Encounter: Payer: Self-pay | Admitting: Family Medicine

## 2017-06-16 DIAGNOSIS — M47816 Spondylosis without myelopathy or radiculopathy, lumbar region: Secondary | ICD-10-CM | POA: Diagnosis not present

## 2017-06-16 DIAGNOSIS — M5136 Other intervertebral disc degeneration, lumbar region: Secondary | ICD-10-CM | POA: Diagnosis not present

## 2017-06-16 DIAGNOSIS — Z23 Encounter for immunization: Secondary | ICD-10-CM | POA: Diagnosis not present

## 2017-06-16 DIAGNOSIS — M6281 Muscle weakness (generalized): Secondary | ICD-10-CM | POA: Diagnosis not present

## 2017-06-16 DIAGNOSIS — I482 Chronic atrial fibrillation: Secondary | ICD-10-CM | POA: Diagnosis not present

## 2017-06-16 DIAGNOSIS — R262 Difficulty in walking, not elsewhere classified: Secondary | ICD-10-CM | POA: Diagnosis not present

## 2017-06-16 DIAGNOSIS — Z9181 History of falling: Secondary | ICD-10-CM | POA: Diagnosis not present

## 2017-06-16 DIAGNOSIS — R2681 Unsteadiness on feet: Secondary | ICD-10-CM | POA: Diagnosis not present

## 2017-06-16 DIAGNOSIS — M545 Low back pain: Secondary | ICD-10-CM | POA: Diagnosis not present

## 2017-06-17 DIAGNOSIS — M6281 Muscle weakness (generalized): Secondary | ICD-10-CM | POA: Diagnosis not present

## 2017-06-17 DIAGNOSIS — Z9181 History of falling: Secondary | ICD-10-CM | POA: Diagnosis not present

## 2017-06-17 DIAGNOSIS — I482 Chronic atrial fibrillation: Secondary | ICD-10-CM | POA: Diagnosis not present

## 2017-06-17 DIAGNOSIS — M545 Low back pain: Secondary | ICD-10-CM | POA: Diagnosis not present

## 2017-06-17 DIAGNOSIS — R262 Difficulty in walking, not elsewhere classified: Secondary | ICD-10-CM | POA: Diagnosis not present

## 2017-06-17 DIAGNOSIS — R2681 Unsteadiness on feet: Secondary | ICD-10-CM | POA: Diagnosis not present

## 2017-06-20 DIAGNOSIS — M6281 Muscle weakness (generalized): Secondary | ICD-10-CM | POA: Diagnosis not present

## 2017-06-20 DIAGNOSIS — I482 Chronic atrial fibrillation: Secondary | ICD-10-CM | POA: Diagnosis not present

## 2017-06-20 DIAGNOSIS — M545 Low back pain: Secondary | ICD-10-CM | POA: Diagnosis not present

## 2017-06-20 DIAGNOSIS — R2681 Unsteadiness on feet: Secondary | ICD-10-CM | POA: Diagnosis not present

## 2017-06-20 DIAGNOSIS — R262 Difficulty in walking, not elsewhere classified: Secondary | ICD-10-CM | POA: Diagnosis not present

## 2017-06-20 DIAGNOSIS — Z9181 History of falling: Secondary | ICD-10-CM | POA: Diagnosis not present

## 2017-06-21 DIAGNOSIS — Z9181 History of falling: Secondary | ICD-10-CM | POA: Diagnosis not present

## 2017-06-21 DIAGNOSIS — R262 Difficulty in walking, not elsewhere classified: Secondary | ICD-10-CM | POA: Diagnosis not present

## 2017-06-21 DIAGNOSIS — I482 Chronic atrial fibrillation: Secondary | ICD-10-CM | POA: Diagnosis not present

## 2017-06-21 DIAGNOSIS — M6281 Muscle weakness (generalized): Secondary | ICD-10-CM | POA: Diagnosis not present

## 2017-06-21 DIAGNOSIS — M545 Low back pain: Secondary | ICD-10-CM | POA: Diagnosis not present

## 2017-06-21 DIAGNOSIS — R2681 Unsteadiness on feet: Secondary | ICD-10-CM | POA: Diagnosis not present

## 2017-06-22 DIAGNOSIS — Z9181 History of falling: Secondary | ICD-10-CM | POA: Diagnosis not present

## 2017-06-22 DIAGNOSIS — R262 Difficulty in walking, not elsewhere classified: Secondary | ICD-10-CM | POA: Diagnosis not present

## 2017-06-22 DIAGNOSIS — I482 Chronic atrial fibrillation: Secondary | ICD-10-CM | POA: Diagnosis not present

## 2017-06-22 DIAGNOSIS — M545 Low back pain: Secondary | ICD-10-CM | POA: Diagnosis not present

## 2017-06-22 DIAGNOSIS — R2681 Unsteadiness on feet: Secondary | ICD-10-CM | POA: Diagnosis not present

## 2017-06-22 DIAGNOSIS — M6281 Muscle weakness (generalized): Secondary | ICD-10-CM | POA: Diagnosis not present

## 2017-06-23 DIAGNOSIS — Z9181 History of falling: Secondary | ICD-10-CM | POA: Diagnosis not present

## 2017-06-23 DIAGNOSIS — M6281 Muscle weakness (generalized): Secondary | ICD-10-CM | POA: Diagnosis not present

## 2017-06-23 DIAGNOSIS — R262 Difficulty in walking, not elsewhere classified: Secondary | ICD-10-CM | POA: Diagnosis not present

## 2017-06-23 DIAGNOSIS — H35363 Drusen (degenerative) of macula, bilateral: Secondary | ICD-10-CM | POA: Diagnosis not present

## 2017-06-23 DIAGNOSIS — I482 Chronic atrial fibrillation: Secondary | ICD-10-CM | POA: Diagnosis not present

## 2017-06-23 DIAGNOSIS — H524 Presbyopia: Secondary | ICD-10-CM | POA: Diagnosis not present

## 2017-06-23 DIAGNOSIS — M545 Low back pain: Secondary | ICD-10-CM | POA: Diagnosis not present

## 2017-06-23 DIAGNOSIS — R2681 Unsteadiness on feet: Secondary | ICD-10-CM | POA: Diagnosis not present

## 2017-06-23 DIAGNOSIS — H26491 Other secondary cataract, right eye: Secondary | ICD-10-CM | POA: Diagnosis not present

## 2017-06-23 DIAGNOSIS — Z961 Presence of intraocular lens: Secondary | ICD-10-CM | POA: Diagnosis not present

## 2017-06-24 DIAGNOSIS — R2681 Unsteadiness on feet: Secondary | ICD-10-CM | POA: Diagnosis not present

## 2017-06-24 DIAGNOSIS — M545 Low back pain: Secondary | ICD-10-CM | POA: Diagnosis not present

## 2017-06-24 DIAGNOSIS — M6281 Muscle weakness (generalized): Secondary | ICD-10-CM | POA: Diagnosis not present

## 2017-06-24 DIAGNOSIS — I482 Chronic atrial fibrillation: Secondary | ICD-10-CM | POA: Diagnosis not present

## 2017-06-24 DIAGNOSIS — R262 Difficulty in walking, not elsewhere classified: Secondary | ICD-10-CM | POA: Diagnosis not present

## 2017-06-24 DIAGNOSIS — Z9181 History of falling: Secondary | ICD-10-CM | POA: Diagnosis not present

## 2017-06-27 DIAGNOSIS — M6281 Muscle weakness (generalized): Secondary | ICD-10-CM | POA: Diagnosis not present

## 2017-06-27 DIAGNOSIS — I482 Chronic atrial fibrillation: Secondary | ICD-10-CM | POA: Diagnosis not present

## 2017-06-27 DIAGNOSIS — M545 Low back pain: Secondary | ICD-10-CM | POA: Diagnosis not present

## 2017-06-27 DIAGNOSIS — R2681 Unsteadiness on feet: Secondary | ICD-10-CM | POA: Diagnosis not present

## 2017-06-27 DIAGNOSIS — Z9181 History of falling: Secondary | ICD-10-CM | POA: Diagnosis not present

## 2017-06-27 DIAGNOSIS — R262 Difficulty in walking, not elsewhere classified: Secondary | ICD-10-CM | POA: Diagnosis not present

## 2017-06-28 DIAGNOSIS — Z9181 History of falling: Secondary | ICD-10-CM | POA: Diagnosis not present

## 2017-06-28 DIAGNOSIS — M6281 Muscle weakness (generalized): Secondary | ICD-10-CM | POA: Diagnosis not present

## 2017-06-28 DIAGNOSIS — R2681 Unsteadiness on feet: Secondary | ICD-10-CM | POA: Diagnosis not present

## 2017-06-28 DIAGNOSIS — M545 Low back pain: Secondary | ICD-10-CM | POA: Diagnosis not present

## 2017-06-28 DIAGNOSIS — I482 Chronic atrial fibrillation: Secondary | ICD-10-CM | POA: Diagnosis not present

## 2017-06-28 DIAGNOSIS — R262 Difficulty in walking, not elsewhere classified: Secondary | ICD-10-CM | POA: Diagnosis not present

## 2017-06-29 DIAGNOSIS — M6281 Muscle weakness (generalized): Secondary | ICD-10-CM | POA: Diagnosis not present

## 2017-06-29 DIAGNOSIS — R2681 Unsteadiness on feet: Secondary | ICD-10-CM | POA: Diagnosis not present

## 2017-06-29 DIAGNOSIS — R262 Difficulty in walking, not elsewhere classified: Secondary | ICD-10-CM | POA: Diagnosis not present

## 2017-06-29 DIAGNOSIS — M545 Low back pain: Secondary | ICD-10-CM | POA: Diagnosis not present

## 2017-06-29 DIAGNOSIS — Z9181 History of falling: Secondary | ICD-10-CM | POA: Diagnosis not present

## 2017-06-29 DIAGNOSIS — I482 Chronic atrial fibrillation: Secondary | ICD-10-CM | POA: Diagnosis not present

## 2017-06-30 DIAGNOSIS — R2681 Unsteadiness on feet: Secondary | ICD-10-CM | POA: Diagnosis not present

## 2017-06-30 DIAGNOSIS — I482 Chronic atrial fibrillation: Secondary | ICD-10-CM | POA: Diagnosis not present

## 2017-06-30 DIAGNOSIS — R262 Difficulty in walking, not elsewhere classified: Secondary | ICD-10-CM | POA: Diagnosis not present

## 2017-06-30 DIAGNOSIS — M545 Low back pain: Secondary | ICD-10-CM | POA: Diagnosis not present

## 2017-06-30 DIAGNOSIS — M6281 Muscle weakness (generalized): Secondary | ICD-10-CM | POA: Diagnosis not present

## 2017-06-30 DIAGNOSIS — Z9181 History of falling: Secondary | ICD-10-CM | POA: Diagnosis not present

## 2017-07-01 DIAGNOSIS — M545 Low back pain: Secondary | ICD-10-CM | POA: Diagnosis not present

## 2017-07-01 DIAGNOSIS — R262 Difficulty in walking, not elsewhere classified: Secondary | ICD-10-CM | POA: Diagnosis not present

## 2017-07-01 DIAGNOSIS — R2681 Unsteadiness on feet: Secondary | ICD-10-CM | POA: Diagnosis not present

## 2017-07-01 DIAGNOSIS — M6281 Muscle weakness (generalized): Secondary | ICD-10-CM | POA: Diagnosis not present

## 2017-07-01 DIAGNOSIS — I482 Chronic atrial fibrillation: Secondary | ICD-10-CM | POA: Diagnosis not present

## 2017-07-01 DIAGNOSIS — Z9181 History of falling: Secondary | ICD-10-CM | POA: Diagnosis not present

## 2017-07-03 DIAGNOSIS — I482 Chronic atrial fibrillation: Secondary | ICD-10-CM | POA: Diagnosis not present

## 2017-07-03 DIAGNOSIS — R2681 Unsteadiness on feet: Secondary | ICD-10-CM | POA: Diagnosis not present

## 2017-07-03 DIAGNOSIS — Z9181 History of falling: Secondary | ICD-10-CM | POA: Diagnosis not present

## 2017-07-03 DIAGNOSIS — R262 Difficulty in walking, not elsewhere classified: Secondary | ICD-10-CM | POA: Diagnosis not present

## 2017-07-03 DIAGNOSIS — M6281 Muscle weakness (generalized): Secondary | ICD-10-CM | POA: Diagnosis not present

## 2017-07-03 DIAGNOSIS — M545 Low back pain: Secondary | ICD-10-CM | POA: Diagnosis not present

## 2017-07-04 DIAGNOSIS — R2681 Unsteadiness on feet: Secondary | ICD-10-CM | POA: Diagnosis not present

## 2017-07-04 DIAGNOSIS — R262 Difficulty in walking, not elsewhere classified: Secondary | ICD-10-CM | POA: Diagnosis not present

## 2017-07-04 DIAGNOSIS — Z9181 History of falling: Secondary | ICD-10-CM | POA: Diagnosis not present

## 2017-07-04 DIAGNOSIS — M545 Low back pain: Secondary | ICD-10-CM | POA: Diagnosis not present

## 2017-07-04 DIAGNOSIS — I482 Chronic atrial fibrillation: Secondary | ICD-10-CM | POA: Diagnosis not present

## 2017-07-04 DIAGNOSIS — M6281 Muscle weakness (generalized): Secondary | ICD-10-CM | POA: Diagnosis not present

## 2017-07-05 DIAGNOSIS — M6281 Muscle weakness (generalized): Secondary | ICD-10-CM | POA: Diagnosis not present

## 2017-07-05 DIAGNOSIS — I482 Chronic atrial fibrillation: Secondary | ICD-10-CM | POA: Diagnosis not present

## 2017-07-05 DIAGNOSIS — R2681 Unsteadiness on feet: Secondary | ICD-10-CM | POA: Diagnosis not present

## 2017-07-05 DIAGNOSIS — M545 Low back pain: Secondary | ICD-10-CM | POA: Diagnosis not present

## 2017-07-05 DIAGNOSIS — Z9181 History of falling: Secondary | ICD-10-CM | POA: Diagnosis not present

## 2017-07-05 DIAGNOSIS — R262 Difficulty in walking, not elsewhere classified: Secondary | ICD-10-CM | POA: Diagnosis not present

## 2017-07-06 ENCOUNTER — Encounter: Payer: Self-pay | Admitting: Family Medicine

## 2017-07-06 ENCOUNTER — Ambulatory Visit (INDEPENDENT_AMBULATORY_CARE_PROVIDER_SITE_OTHER): Payer: Medicare Other | Admitting: General Practice

## 2017-07-06 ENCOUNTER — Ambulatory Visit (INDEPENDENT_AMBULATORY_CARE_PROVIDER_SITE_OTHER): Payer: Medicare Other | Admitting: Family Medicine

## 2017-07-06 VITALS — BP 110/78 | HR 88 | Temp 98.4°F | Resp 16 | Ht 67.0 in | Wt 190.0 lb

## 2017-07-06 DIAGNOSIS — I4891 Unspecified atrial fibrillation: Secondary | ICD-10-CM | POA: Diagnosis not present

## 2017-07-06 DIAGNOSIS — M545 Low back pain: Secondary | ICD-10-CM | POA: Diagnosis not present

## 2017-07-06 DIAGNOSIS — Z9181 History of falling: Secondary | ICD-10-CM | POA: Diagnosis not present

## 2017-07-06 DIAGNOSIS — I482 Chronic atrial fibrillation: Secondary | ICD-10-CM | POA: Diagnosis not present

## 2017-07-06 DIAGNOSIS — M1991 Primary osteoarthritis, unspecified site: Secondary | ICD-10-CM

## 2017-07-06 DIAGNOSIS — H6123 Impacted cerumen, bilateral: Secondary | ICD-10-CM

## 2017-07-06 DIAGNOSIS — M6281 Muscle weakness (generalized): Secondary | ICD-10-CM | POA: Diagnosis not present

## 2017-07-06 DIAGNOSIS — R2681 Unsteadiness on feet: Secondary | ICD-10-CM | POA: Diagnosis not present

## 2017-07-06 DIAGNOSIS — Z7901 Long term (current) use of anticoagulants: Secondary | ICD-10-CM

## 2017-07-06 DIAGNOSIS — R262 Difficulty in walking, not elsewhere classified: Secondary | ICD-10-CM | POA: Diagnosis not present

## 2017-07-06 LAB — POCT INR: INR: 3.5

## 2017-07-06 NOTE — Progress Notes (Signed)
Subjective:     Patient ID: Angel Meyer, female   DOB: 1933/08/21, 83 y.o.   MRN: [id number]  HPI Patient is here with cerumen impactions. She has long history of decreased hearing. She went to [facility] to get her hearing rechecked. She has seen audiologist there in the past. They explained that she has impactions and she was told to follow-up here. She's not tried any home remedies.  Patient has osteoarthritis involving multiple joints. Orthopedist placed her on tramadol 2 tablets 3 times a day. She had been on 1 tramadol 3 times a day and since going to 2 3 times a day has been more sedated. Daughter is requesting that we reduce her dosage back to one 3 times a day and then may take one extra 3 times a day when necessary. She has had no vomiting. No abdominal pain. No dysuria. No cough.  Past Medical History:  Diagnosis Date  . Arthritis   . Diastolic dysfunction    preserved EF,  CHF In setting of afib previously  . History of blood transfusion   . Hyperlipidemia   . Hypertension   . Incontinence of urine   . OSA (obstructive sleep apnea)    does not use CPAP  . Osteoporosis   . Persistent atrial fibrillation (HCC)    failed on Flecainide, Tikosyn, and amiodarone  . Renal calculi   . Restless legs   . Rheumatic fever    age 53  . Systolic heart failure (HCC)    EF 30 to 35% per echo 08/2013,  subsequently has normalized   Past Surgical History:  Procedure Laterality Date  . ABDOMINAL HYSTERECTOMY    . BREAST SURGERY     benign biopsy  . CARDIOVERSION N/A 09/10/2013   Procedure: CARDIOVERSION;  Surgeon: Thompson Grayer, MD;  Location: [place];  Service: Cardiovascular;  Laterality: N/A;  . CARDIOVERSION N/A 11/16/2013   Procedure: CARDIOVERSION;  Surgeon: Sueanne Margarita, MD;  Location: [facility] ENDOSCOPY;  Service: Cardiovascular;  Laterality: N/A;  . CARDIOVERSION N/A 08/18/2015   Procedure: CARDIOVERSION;  Surgeon: Sueanne Margarita, MD;  Location: [facility] ENDOSCOPY;  Service: Cardiovascular;   Laterality: N/A;  . CARDIOVERSION N/A 03/07/2017   Procedure: CARDIOVERSION;  Surgeon: Dorothy Spark, MD;  Location: [facility] ENDOSCOPY;  Service: Cardiovascular;  Laterality: N/A;  . CARDIOVERSION N/A 05/13/2017   Procedure: CARDIOVERSION;  Surgeon: Lelon Perla, MD;  Location: [facility] ENDOSCOPY;  Service: Cardiovascular;  Laterality: N/A;  . IR RADIOLOGIST EVAL & MGMT  02/04/2017  . IR VERTEBROPLASTY LUMBAR BX INC UNI/BIL INC/INJECT/IMAGING  02/10/2017  . [place] removed  . REPLACEMENT TOTAL KNEE  2005  . SPINE SURGERY     laminectomy 1965, spinal fusion with rod 2005  . TEE WITHOUT CARDIOVERSION N/A 03/07/2017   Procedure: TRANSESOPHAGEAL ECHOCARDIOGRAM (TEE);  Surgeon: Dorothy Spark, MD;  Location: [facility] ENDOSCOPY;  Service: Cardiovascular;  Laterality: N/A;  . TEE WITHOUT CARDIOVERSION N/A 05/13/2017   Procedure: TRANSESOPHAGEAL ECHOCARDIOGRAM (TEE);  Surgeon: Lelon Perla, MD;  Location: [facility] ENDOSCOPY;  Service: Cardiovascular;  Laterality: N/A;  . TONSILLECTOMY      reports that she has never smoked. She has never used smokeless tobacco. She reports that she does not drink alcohol or use drugs. family history includes Cancer in her mother and paternal grandfather; Diabetes in her mother. Allergies  Allergen Reactions  . Ciprofloxacin Nausea And Vomiting  . Demerol [Meperidine] Nausea And Vomiting  . Sulfa Antibiotics Nausea And Vomiting  And all derivatives -  GI Bleeding  . Sulfacetamide Sodium Nausea And Vomiting     Review of Systems  Constitutional: Negative for chills and fever.  HENT: Positive for hearing loss. Negative for ear discharge and ear pain.   Gastrointestinal: Positive for nausea. Negative for abdominal pain and diarrhea.  Genitourinary: Negative for dysuria.       Objective:   Physical Exam  Constitutional: She appears well-developed and well-nourished.  HENT:  Bilateral cerumen impactions totally obscuring eardrum and totally  blocking ear canal. Both canals are clear using curette without difficulty. We were able to remove wax in entirety and eardrums appear normal  Cardiovascular:  Irregular rhythm but rate controlled  Pulmonary/Chest: Effort normal and breath sounds normal. No respiratory distress. She has no wheezes. She has no rales.       Assessment:     #1 bilateral cerumen impactions removed with curette bilaterally  #2 increased sedation possibly related to increase in tramadol dose.    Plan:     -Reduce tramadol 50 mg back to one 3 times a day and then may give extra tablet 3 times a day when necessary -Follow-up promptly for any fever or other change of symptoms  [name] MD [facility]

## 2017-07-06 NOTE — Patient Instructions (Signed)
Pre visit review using our clinic review tool, if applicable. No additional management support is needed unless otherwise documented below in the visit note. 

## 2017-07-06 NOTE — Patient Instructions (Signed)
Earwax Buildup, Adult The ears produce a substance called earwax that helps keep bacteria out of the ear and protects the skin in the ear canal. Occasionally, earwax can build up in the ear and cause discomfort or hearing loss. What increases the risk? This condition is more likely to develop in people who:  Are female.  Are elderly.  Naturally produce more earwax.  Clean their ears often with cotton swabs.  Use earplugs often.  Use in-ear headphones often.  Wear hearing aids.  Have narrow ear canals.  Have earwax that is overly thick or sticky.  Have eczema.  Are dehydrated.  Have excess hair in the ear canal.  What are the signs or symptoms? Symptoms of this condition include:  Reduced or muffled hearing.  A feeling of fullness in the ear or feeling that the ear is plugged.  Fluid coming from the ear.  Ear pain.  Ear itch.  Ringing in the ear.  Coughing.  An obvious piece of earwax that can be seen inside the ear canal.  How is this diagnosed? This condition may be diagnosed based on:  Your symptoms.  Your medical history.  An ear exam. During the exam, your health care provider will look into your ear with an instrument called an otoscope.  You may have tests, including a hearing test. How is this treated? This condition may be treated by:  Using ear drops to soften the earwax.  Having the earwax removed by a health care provider. The health care provider may: ? Flush the ear with water. ? Use an instrument that has a loop on the end (curette). ? Use a suction device.  Surgery to remove the wax buildup. This may be done in severe cases.  Follow these instructions at home:  Take over-the-counter and prescription medicines only as told by your health care provider.  Do not put any objects, including cotton swabs, into your ear. You can clean the opening of your ear canal with a washcloth or facial tissue.  Follow instructions from your health  care provider about cleaning your ears. Do not over-clean your ears.  Drink enough fluid to keep your urine clear or pale yellow. This will help to thin the earwax.  Keep all follow-up visits as told by your health care provider. If earwax builds up in your ears often or if you use hearing aids, consider seeing your health care provider for routine, preventive ear cleanings. Ask your health care provider how often you should schedule your cleanings.  If you have hearing aids, clean them according to instructions from the manufacturer and your health care provider. Contact a health care provider if:  You have ear pain.  You develop a fever.  You have blood, pus, or other fluid coming from your ear.  You have hearing loss.  You have ringing in your ears that does not go away.  Your symptoms do not improve with treatment.  You feel like the room is spinning (vertigo). Summary  Earwax can build up in the ear and cause discomfort or hearing loss.  The most common symptoms of this condition include reduced or muffled hearing and a feeling of fullness in the ear or feeling that the ear is plugged.  This condition may be diagnosed based on your symptoms, your medical history, and an ear exam.  This condition may be treated by using ear drops to soften the earwax or by having the earwax removed by a health care provider.  Do   not put any objects, including cotton swabs, into your ear. You can clean the opening of your ear canal with a washcloth or facial tissue. This information is not intended to replace advice given to you by your health care provider. Make sure you discuss any questions you have with your health care provider. Document Released: 10/21/2004 Document Revised: 11/24/2016 Document Reviewed: 11/24/2016 Elsevier Interactive Patient Education  2018 [employer]  

## 2017-07-11 ENCOUNTER — Ambulatory Visit (INDEPENDENT_AMBULATORY_CARE_PROVIDER_SITE_OTHER): Payer: Medicare Other | Admitting: Internal Medicine

## 2017-07-11 ENCOUNTER — Encounter: Payer: Self-pay | Admitting: Internal Medicine

## 2017-07-11 VITALS — BP 118/64 | HR 100 | Ht 67.0 in | Wt 189.4 lb

## 2017-07-11 DIAGNOSIS — M6281 Muscle weakness (generalized): Secondary | ICD-10-CM | POA: Diagnosis not present

## 2017-07-11 DIAGNOSIS — R262 Difficulty in walking, not elsewhere classified: Secondary | ICD-10-CM | POA: Diagnosis not present

## 2017-07-11 DIAGNOSIS — M545 Low back pain: Secondary | ICD-10-CM | POA: Diagnosis not present

## 2017-07-11 DIAGNOSIS — Z9181 History of falling: Secondary | ICD-10-CM | POA: Diagnosis not present

## 2017-07-11 DIAGNOSIS — I10 Essential (primary) hypertension: Secondary | ICD-10-CM | POA: Diagnosis not present

## 2017-07-11 DIAGNOSIS — I4819 Other persistent atrial fibrillation: Secondary | ICD-10-CM

## 2017-07-11 DIAGNOSIS — I481 Persistent atrial fibrillation: Secondary | ICD-10-CM

## 2017-07-11 DIAGNOSIS — I482 Chronic atrial fibrillation: Secondary | ICD-10-CM | POA: Diagnosis not present

## 2017-07-11 DIAGNOSIS — G4733 Obstructive sleep apnea (adult) (pediatric): Secondary | ICD-10-CM | POA: Diagnosis not present

## 2017-07-11 DIAGNOSIS — I4891 Unspecified atrial fibrillation: Secondary | ICD-10-CM

## 2017-07-11 DIAGNOSIS — R2681 Unsteadiness on feet: Secondary | ICD-10-CM | POA: Diagnosis not present

## 2017-07-11 MED ORDER — DILTIAZEM HCL ER COATED BEADS 240 MG PO CP24: 240.0000 mg | ORAL_CAPSULE | Freq: Two times a day (BID) | ORAL | 3 refills | Status: DC

## 2017-07-11 NOTE — Progress Notes (Signed)
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  Since last being seen in our clinic, the patient reports doing reasonably well.  She remains fatigued.  In a wheelchair today.  Doesn't feel like walking very much due to "weakness" but also has difficulty with backpain.   Today, she denies symptoms of palpitations, chest pain, shortness of breath,  lower extremity edema, dizziness, presyncope, or syncope.  The patient is otherwise without complaint today.   Past Medical History:  Diagnosis Date  . Arthritis   . Diastolic dysfunction    preserved EF,  CHF In setting of afib previously  . History of blood transfusion   . Hyperlipidemia   . Hypertension   . Incontinence of urine   . OSA (obstructive sleep apnea)    does not use CPAP  . Osteoporosis   . Persistent atrial fibrillation (HCC)    failed on Flecainide, Tikosyn, and amiodarone  . Renal calculi   . Restless legs   . Rheumatic fever    age 70  . Systolic heart failure (HCC)    EF 30 to 35% per echo 08/2013,  subsequently has normalized   Past Surgical History:  Procedure Laterality Date  . ABDOMINAL HYSTERECTOMY    . BREAST SURGERY     benign biopsy  . CARDIOVERSION N/A 09/10/2013   Procedure: CARDIOVERSION;  Surgeon: Angel Grayer, MD;  Location: [place];  Service: Cardiovascular;  Laterality: N/A;  . CARDIOVERSION N/A 11/16/2013   Procedure: CARDIOVERSION;  Surgeon: Angel Margarita, MD;  Location: [facility] ENDOSCOPY;  Service: Cardiovascular;  Laterality: N/A;  . CARDIOVERSION N/A 08/18/2015   Procedure: CARDIOVERSION;  Surgeon: Angel Margarita, MD;  Location: [facility] ENDOSCOPY;  Service: Cardiovascular;  Laterality: N/A;  . CARDIOVERSION N/A 03/07/2017   Procedure: CARDIOVERSION;  Surgeon: Angel Spark, MD;  Location: [facility] ENDOSCOPY;  Service: Cardiovascular;  Laterality: N/A;  . CARDIOVERSION N/A 05/13/2017   Procedure: CARDIOVERSION;   Surgeon: Angel Perla, MD;  Location: [facility] ENDOSCOPY;  Service: Cardiovascular;  Laterality: N/A;  . IR RADIOLOGIST EVAL & MGMT  02/04/2017  . IR VERTEBROPLASTY LUMBAR BX INC UNI/BIL INC/INJECT/IMAGING  02/10/2017  . New Site removed  . REPLACEMENT TOTAL KNEE  2005  . SPINE SURGERY     laminectomy 1965, spinal fusion with rod 2005  . TEE WITHOUT CARDIOVERSION N/A 03/07/2017   Procedure: TRANSESOPHAGEAL ECHOCARDIOGRAM (TEE);  Surgeon: Angel Spark, MD;  Location: [facility] ENDOSCOPY;  Service: Cardiovascular;  Laterality: N/A;  . TEE WITHOUT CARDIOVERSION N/A 05/13/2017   Procedure: TRANSESOPHAGEAL ECHOCARDIOGRAM (TEE);  Surgeon: Angel Perla, MD;  Location: [facility] ENDOSCOPY;  Service: Cardiovascular;  Laterality: N/A;  . TONSILLECTOMY      ROS- all systems are reviewed and negatives except as per HPI above  Current Outpatient Prescriptions  Medication Sig Dispense Refill  . acetaminophen (TYLENOL) 325 MG tablet Take 650 mg by mouth 2 (two) times daily after a meal.    . diltiazem (CARDIZEM CD) 360 MG 24 hr capsule Take 1 capsule (360 mg total) by mouth daily. 90 capsule 3  . diltiazem (CARDIZEM) 30 MG tablet Take 30 mg by mouth as needed. For Blood pressure    . donepezil (ARICEPT ODT) 10 MG disintegrating tablet Take 1 tablet (10 mg total) by mouth at bedtime. 90 tablet 2  . escitalopram (LEXAPRO) 10 MG tablet TAKE 1 TABLET EVERY  DAY 90 tablet 1  . fluocinonide cream (LIDEX) 1.49 % Apply 1 application topically 2 (two) times daily as needed (CHRONIC RASH).    . furosemide (LASIX) 40 MG tablet Take 1 tablet (40 mg total) by mouth daily. 90 tablet 3  . Melatonin 5 MG TABS Take 10 mg by mouth at bedtime.     . Misc. Devices (ROLLER [place]) MISC Used as Directed. 1 each 0  . Multiple Vitamins-Minerals (PRESERVISION AREDS) CAPS Take 1 capsule by mouth 2 (two) times daily.    . nitroGLYCERIN (NITROSTAT) 0.4 MG SL tablet Place 1 tablet (0.4 mg total) under the tongue every  5 (five) minutes as needed for chest pain. 25 tablet 3  . oxybutynin (DITROPAN-XL) 10 MG 24 hr tablet Take 10 mg by mouth daily.   11  . potassium chloride SA (K-DUR,KLOR-CON) 20 MEQ tablet Take 1 tablet (20 mEq total) by mouth daily. 90 tablet 3  . rOPINIRole (REQUIP) 1 MG tablet Take 1 tablet (1 mg total) by mouth See admin instructions. Take 1 tablet (1 mg) by mouth daily at 4pm and at bedtime (Patient taking differently: Take 1 mg by mouth at bedtime. Take 1 tablet (1 mg) by mouth daily at 4pm and at bedtime 8 pm) 180 tablet 2  . rosuvastatin (CRESTOR) 40 MG tablet Take 1 tablet (40 mg total) by mouth at bedtime. 90 tablet 3  . spironolactone (ALDACTONE) 25 MG tablet Take 1 tablet (25 mg total) by mouth daily. 90 tablet 3  . traMADol (ULTRAM) 50 MG tablet Take 50 mg by mouth 3 (three) times daily. Takes 2 three times daily    . trimethoprim (TRIMPEX) 100 MG tablet Take 100 mg by mouth daily.   11  . warfarin (COUMADIN) 2.5 MG tablet Take as directed by anticoagulation clinic. (Patient taking differently: Take 2.5 mg by mouth every evening. Take as directed by anticoagulation clinic.) 105 tablet 1   No current facility-administered medications for this visit.     Physical Exam: Vitals:   07/11/17 1006  BP: 118/64  Pulse: 100  Weight: 189 lb 6.4 oz (85.9 kg)  Height: 5\' 7"  (1.702 m)    GEN- The patient is well appearing, alert and oriented x 3 today.   Head- normocephalic, atraumatic Eyes-  Sclera clear, conjunctiva pink Ears- hearing intact Oropharynx- clear Lungs- Clear to ausculation bilaterally, normal work of breathing Heart- irregular rate and rhythm, no murmurs, rubs or gallops, PMI not laterally displaced GI- soft, NT, ND, + BS Extremities- no clubbing, cyanosis, or edema  EKG tracing ordered today is personally reviewed and shows afib with RVR, V rate 100 bpm  Assessment and Plan:  1. Medicine refractory persistent atrial fibrillation with rheumatic heart disease V  rates remain elevated Will increase diltiazem CD to 240mg  BID Continue anticoagulation IF we cannot control V rates with diltiazem, we may have to consider AV nodal ablation PPM implantation  2. OSA Stable No change required today  3. HTN Stable No change required today Stop KDur Repeat bmet upon return  4. Obesity Body mass index is 29.66 kg/m.  Weight loss is advised  Return to see me in 4 weeks  Angel Grayer MD, [facility] 07/11/2017 10:35 AM

## 2017-07-11 NOTE — Patient Instructions (Addendum)
Medication Instructions:  Your physician has recommended you make the following change in your medication:  Increase Diltiazem CD to 240mg  twice a day  Stop Potassium     Labwork: None ordered   Testing/Procedures: None ordered   Follow-Up: Your physician recommends that you schedule a follow-up appointment in: 4 weeks with Dr. Rayann Heman   Any Other Special Instructions Will Be Listed Below (If Applicable).     If you need a refill on your cardiac medications before your next appointment, please call your pharmacy.

## 2017-07-12 DIAGNOSIS — M545 Low back pain: Secondary | ICD-10-CM | POA: Diagnosis not present

## 2017-07-12 DIAGNOSIS — R2681 Unsteadiness on feet: Secondary | ICD-10-CM | POA: Diagnosis not present

## 2017-07-12 DIAGNOSIS — Z9181 History of falling: Secondary | ICD-10-CM | POA: Diagnosis not present

## 2017-07-12 DIAGNOSIS — M6281 Muscle weakness (generalized): Secondary | ICD-10-CM | POA: Diagnosis not present

## 2017-07-12 DIAGNOSIS — M5136 Other intervertebral disc degeneration, lumbar region: Secondary | ICD-10-CM | POA: Diagnosis not present

## 2017-07-12 DIAGNOSIS — M47816 Spondylosis without myelopathy or radiculopathy, lumbar region: Secondary | ICD-10-CM | POA: Diagnosis not present

## 2017-07-12 DIAGNOSIS — R262 Difficulty in walking, not elsewhere classified: Secondary | ICD-10-CM | POA: Diagnosis not present

## 2017-07-12 DIAGNOSIS — I482 Chronic atrial fibrillation: Secondary | ICD-10-CM | POA: Diagnosis not present

## 2017-07-13 DIAGNOSIS — M545 Low back pain: Secondary | ICD-10-CM | POA: Diagnosis not present

## 2017-07-13 DIAGNOSIS — I482 Chronic atrial fibrillation: Secondary | ICD-10-CM | POA: Diagnosis not present

## 2017-07-13 DIAGNOSIS — Z9181 History of falling: Secondary | ICD-10-CM | POA: Diagnosis not present

## 2017-07-13 DIAGNOSIS — R262 Difficulty in walking, not elsewhere classified: Secondary | ICD-10-CM | POA: Diagnosis not present

## 2017-07-13 DIAGNOSIS — M6281 Muscle weakness (generalized): Secondary | ICD-10-CM | POA: Diagnosis not present

## 2017-07-13 DIAGNOSIS — R2681 Unsteadiness on feet: Secondary | ICD-10-CM | POA: Diagnosis not present

## 2017-07-14 DIAGNOSIS — R2681 Unsteadiness on feet: Secondary | ICD-10-CM | POA: Diagnosis not present

## 2017-07-14 DIAGNOSIS — R262 Difficulty in walking, not elsewhere classified: Secondary | ICD-10-CM | POA: Diagnosis not present

## 2017-07-14 DIAGNOSIS — Z9181 History of falling: Secondary | ICD-10-CM | POA: Diagnosis not present

## 2017-07-14 DIAGNOSIS — I482 Chronic atrial fibrillation: Secondary | ICD-10-CM | POA: Diagnosis not present

## 2017-07-14 DIAGNOSIS — M6281 Muscle weakness (generalized): Secondary | ICD-10-CM | POA: Diagnosis not present

## 2017-07-14 DIAGNOSIS — M545 Low back pain: Secondary | ICD-10-CM | POA: Diagnosis not present

## 2017-07-15 DIAGNOSIS — M545 Low back pain: Secondary | ICD-10-CM | POA: Diagnosis not present

## 2017-07-15 DIAGNOSIS — R262 Difficulty in walking, not elsewhere classified: Secondary | ICD-10-CM | POA: Diagnosis not present

## 2017-07-15 DIAGNOSIS — M6281 Muscle weakness (generalized): Secondary | ICD-10-CM | POA: Diagnosis not present

## 2017-07-15 DIAGNOSIS — I482 Chronic atrial fibrillation: Secondary | ICD-10-CM | POA: Diagnosis not present

## 2017-07-15 DIAGNOSIS — R2681 Unsteadiness on feet: Secondary | ICD-10-CM | POA: Diagnosis not present

## 2017-07-15 DIAGNOSIS — Z9181 History of falling: Secondary | ICD-10-CM | POA: Diagnosis not present

## 2017-07-17 DIAGNOSIS — M6281 Muscle weakness (generalized): Secondary | ICD-10-CM | POA: Diagnosis not present

## 2017-07-17 DIAGNOSIS — R262 Difficulty in walking, not elsewhere classified: Secondary | ICD-10-CM | POA: Diagnosis not present

## 2017-07-17 DIAGNOSIS — R2681 Unsteadiness on feet: Secondary | ICD-10-CM | POA: Diagnosis not present

## 2017-07-17 DIAGNOSIS — Z9181 History of falling: Secondary | ICD-10-CM | POA: Diagnosis not present

## 2017-07-17 DIAGNOSIS — M545 Low back pain: Secondary | ICD-10-CM | POA: Diagnosis not present

## 2017-07-17 DIAGNOSIS — I482 Chronic atrial fibrillation: Secondary | ICD-10-CM | POA: Diagnosis not present

## 2017-07-18 DIAGNOSIS — M6281 Muscle weakness (generalized): Secondary | ICD-10-CM | POA: Diagnosis not present

## 2017-07-18 DIAGNOSIS — R2681 Unsteadiness on feet: Secondary | ICD-10-CM | POA: Diagnosis not present

## 2017-07-18 DIAGNOSIS — R262 Difficulty in walking, not elsewhere classified: Secondary | ICD-10-CM | POA: Diagnosis not present

## 2017-07-18 DIAGNOSIS — Z9181 History of falling: Secondary | ICD-10-CM | POA: Diagnosis not present

## 2017-07-18 DIAGNOSIS — I482 Chronic atrial fibrillation: Secondary | ICD-10-CM | POA: Diagnosis not present

## 2017-07-18 DIAGNOSIS — M545 Low back pain: Secondary | ICD-10-CM | POA: Diagnosis not present

## 2017-07-19 DIAGNOSIS — R262 Difficulty in walking, not elsewhere classified: Secondary | ICD-10-CM | POA: Diagnosis not present

## 2017-07-19 DIAGNOSIS — Z9181 History of falling: Secondary | ICD-10-CM | POA: Diagnosis not present

## 2017-07-19 DIAGNOSIS — M6281 Muscle weakness (generalized): Secondary | ICD-10-CM | POA: Diagnosis not present

## 2017-07-19 DIAGNOSIS — R2681 Unsteadiness on feet: Secondary | ICD-10-CM | POA: Diagnosis not present

## 2017-07-19 DIAGNOSIS — M545 Low back pain: Secondary | ICD-10-CM | POA: Diagnosis not present

## 2017-07-19 DIAGNOSIS — I482 Chronic atrial fibrillation: Secondary | ICD-10-CM | POA: Diagnosis not present

## 2017-07-20 DIAGNOSIS — M6281 Muscle weakness (generalized): Secondary | ICD-10-CM | POA: Diagnosis not present

## 2017-07-20 DIAGNOSIS — M545 Low back pain: Secondary | ICD-10-CM | POA: Diagnosis not present

## 2017-07-20 DIAGNOSIS — R262 Difficulty in walking, not elsewhere classified: Secondary | ICD-10-CM | POA: Diagnosis not present

## 2017-07-20 DIAGNOSIS — I482 Chronic atrial fibrillation: Secondary | ICD-10-CM | POA: Diagnosis not present

## 2017-07-20 DIAGNOSIS — R2681 Unsteadiness on feet: Secondary | ICD-10-CM | POA: Diagnosis not present

## 2017-07-20 DIAGNOSIS — Z9181 History of falling: Secondary | ICD-10-CM | POA: Diagnosis not present

## 2017-07-21 ENCOUNTER — Encounter: Payer: Self-pay | Admitting: Internal Medicine

## 2017-07-21 ENCOUNTER — Ambulatory Visit (INDEPENDENT_AMBULATORY_CARE_PROVIDER_SITE_OTHER): Payer: Medicare Other | Admitting: Internal Medicine

## 2017-07-21 VITALS — BP 118/64 | HR 78 | Temp 97.5°F | Ht 67.0 in | Wt 196.2 lb

## 2017-07-21 DIAGNOSIS — Z9181 History of falling: Secondary | ICD-10-CM | POA: Diagnosis not present

## 2017-07-21 DIAGNOSIS — L309 Dermatitis, unspecified: Secondary | ICD-10-CM | POA: Diagnosis not present

## 2017-07-21 DIAGNOSIS — I482 Chronic atrial fibrillation: Secondary | ICD-10-CM | POA: Diagnosis not present

## 2017-07-21 DIAGNOSIS — R300 Dysuria: Secondary | ICD-10-CM | POA: Diagnosis not present

## 2017-07-21 DIAGNOSIS — R2681 Unsteadiness on feet: Secondary | ICD-10-CM | POA: Diagnosis not present

## 2017-07-21 DIAGNOSIS — M545 Low back pain: Secondary | ICD-10-CM | POA: Diagnosis not present

## 2017-07-21 DIAGNOSIS — R262 Difficulty in walking, not elsewhere classified: Secondary | ICD-10-CM | POA: Diagnosis not present

## 2017-07-21 DIAGNOSIS — M6281 Muscle weakness (generalized): Secondary | ICD-10-CM | POA: Diagnosis not present

## 2017-07-21 LAB — POC URINALSYSI DIPSTICK (AUTOMATED)
BILIRUBIN UA: NEGATIVE
Blood, UA: NEGATIVE
GLUCOSE UA: NEGATIVE
Ketones, UA: NEGATIVE
LEUKOCYTES UA: NEGATIVE
NITRITE UA: NEGATIVE
PH UA: 5.5 (ref 5.0–8.0)
Spec Grav, UA: >=1.03 — AB (ref 1.010–1.025)
Urobilinogen, UA: 0.2 E.U./dL

## 2017-07-21 NOTE — Progress Notes (Signed)
Subjective:    Patient ID: Angel Meyer, female    DOB: 09-12-1933, 83 y.o.   MRN: [id number]  HPI  83 year old patient who resides at an assisted living facility who has multiple medical problems.  This includes atrial fibrillation requiring chronic Coumadin anticoagulation. The patientand daughter have noted a rash involving the anterior aspects of both lower legs.  Rash is nonpruritic.  No new medications.  The daughter has a picture from yesterday on her cell phone; the rash is clearly fading and less prominent She does have a history of lichen simplex chronicus involving the upper extremities, but this rash is clearly different  Past Medical History:  Diagnosis Date  . Arthritis   . Diastolic dysfunction    preserved EF,  CHF In setting of afib previously  . History of blood transfusion   . Hyperlipidemia   . Hypertension   . Incontinence of urine   . OSA (obstructive sleep apnea)    does not use CPAP  . Osteoporosis   . Persistent atrial fibrillation (HCC)    failed on Flecainide, Tikosyn, and amiodarone  . Renal calculi   . Restless legs   . Rheumatic fever    age 56  . Systolic heart failure (HCC)    EF 30 to 35% per echo 08/2013,  subsequently has normalized     Social History   Social History  . Marital status: Divorced    Spouse name: N/A  . Number of children: N/A  . Years of education: N/A   Occupational History  . Not on file.   Social History Main Topics  . Smoking status: Never Smoker  . Smokeless tobacco: Never Used  . Alcohol use No  . Drug use: No  . Sexual activity: Not Currently   Other Topics Concern  . Not on file   Social History Narrative   Lives in [place] alone.  Spends 4-6 months per year in [place] with her daughter.   Retired Network engineer    Past Surgical History:  Procedure Laterality Date  . ABDOMINAL HYSTERECTOMY    . BREAST SURGERY     benign biopsy  . CARDIOVERSION N/A 09/10/2013   Procedure: CARDIOVERSION;  Surgeon:  Thompson Grayer, MD;  Location: [place];  Service: Cardiovascular;  Laterality: N/A;  . CARDIOVERSION N/A 11/16/2013   Procedure: CARDIOVERSION;  Surgeon: Sueanne Margarita, MD;  Location: [facility] ENDOSCOPY;  Service: Cardiovascular;  Laterality: N/A;  . CARDIOVERSION N/A 08/18/2015   Procedure: CARDIOVERSION;  Surgeon: Sueanne Margarita, MD;  Location: [facility] ENDOSCOPY;  Service: Cardiovascular;  Laterality: N/A;  . CARDIOVERSION N/A 03/07/2017   Procedure: CARDIOVERSION;  Surgeon: Dorothy Spark, MD;  Location: [facility] ENDOSCOPY;  Service: Cardiovascular;  Laterality: N/A;  . CARDIOVERSION N/A 05/13/2017   Procedure: CARDIOVERSION;  Surgeon: Lelon Perla, MD;  Location: [facility] ENDOSCOPY;  Service: Cardiovascular;  Laterality: N/A;  . IR RADIOLOGIST EVAL & MGMT  02/04/2017  . IR VERTEBROPLASTY LUMBAR BX INC UNI/BIL INC/INJECT/IMAGING  02/10/2017  . [place] removed  . REPLACEMENT TOTAL KNEE  2005  . SPINE SURGERY     laminectomy 1965, spinal fusion with rod 2005  . TEE WITHOUT CARDIOVERSION N/A 03/07/2017   Procedure: TRANSESOPHAGEAL ECHOCARDIOGRAM (TEE);  Surgeon: Dorothy Spark, MD;  Location: [facility] ENDOSCOPY;  Service: Cardiovascular;  Laterality: N/A;  . TEE WITHOUT CARDIOVERSION N/A 05/13/2017   Procedure: TRANSESOPHAGEAL ECHOCARDIOGRAM (TEE);  Surgeon: Lelon Perla, MD;  Location: [place];  Service: Cardiovascular;  Laterality: N/A;  . TONSILLECTOMY      Family History  Problem Relation Age of Onset  . Cancer Mother        breast  . Diabetes Mother   . Cancer Paternal Grandfather     Allergies  Allergen Reactions  . Ciprofloxacin Nausea And Vomiting  . Demerol [Meperidine] Nausea And Vomiting  . Sulfa Antibiotics Nausea And Vomiting    And all derivatives -  GI Bleeding  . Sulfacetamide Sodium Nausea And Vomiting    Current Outpatient Prescriptions on File Prior to Visit  Medication Sig Dispense Refill  . acetaminophen (TYLENOL) 325 MG tablet Take 650 mg by mouth  2 (two) times daily after a meal.    . diltiazem (CARDIZEM CD) 240 MG 24 hr capsule Take 1 capsule (240 mg total) by mouth 2 (two) times daily. 180 capsule 3  . diltiazem (CARDIZEM) 30 MG tablet Take 30 mg by mouth as needed. For Blood pressure    . donepezil (ARICEPT ODT) 10 MG disintegrating tablet Take 1 tablet (10 mg total) by mouth at bedtime. 90 tablet 2  . escitalopram (LEXAPRO) 10 MG tablet TAKE 1 TABLET EVERY DAY 90 tablet 1  . fluocinonide cream (LIDEX) 0.10 % Apply 1 application topically 2 (two) times daily as needed (CHRONIC RASH).    . furosemide (LASIX) 40 MG tablet Take 1 tablet (40 mg total) by mouth daily. 90 tablet 3  . Melatonin 5 MG TABS Take 10 mg by mouth at bedtime.     . Misc. Devices (ROLLER [place]) MISC Used as Directed. 1 each 0  . Multiple Vitamins-Minerals (PRESERVISION AREDS) CAPS Take 1 capsule by mouth 2 (two) times daily.    . nitroGLYCERIN (NITROSTAT) 0.4 MG SL tablet Place 1 tablet (0.4 mg total) under the tongue every 5 (five) minutes as needed for chest pain. 25 tablet 3  . oxybutynin (DITROPAN-XL) 10 MG 24 hr tablet Take 10 mg by mouth daily.   11  . rOPINIRole (REQUIP) 1 MG tablet Take 1 tablet (1 mg total) by mouth See admin instructions. Take 1 tablet (1 mg) by mouth daily at 4pm and at bedtime (Patient taking differently: Take 1 mg by mouth at bedtime. Take 1 tablet (1 mg) by mouth daily at 4pm and at bedtime 8 pm) 180 tablet 2  . rosuvastatin (CRESTOR) 40 MG tablet Take 1 tablet (40 mg total) by mouth at bedtime. 90 tablet 3  . spironolactone (ALDACTONE) 25 MG tablet Take 1 tablet (25 mg total) by mouth daily. 90 tablet 3  . traMADol (ULTRAM) 50 MG tablet Take 50 mg by mouth 3 (three) times daily. Takes 2 three times daily    . trimethoprim (TRIMPEX) 100 MG tablet Take 100 mg by mouth daily.   11  . warfarin (COUMADIN) 2.5 MG tablet Take as directed by anticoagulation clinic. (Patient taking differently: Take 2.5 mg by mouth every evening. Take as directed  by anticoagulation clinic.) 105 tablet 1   No current facility-administered medications on file prior to visit.     BP 118/64 (BP Location: Left Arm, Patient Position: Sitting, Cuff Size: Normal)   Pulse 78   Temp (!) 97.5 F (36.4 C) (Oral)   Ht 5\' 7"  (1.702 m)   Wt 196 lb 3.2 oz (89 kg)   SpO2 96%   BMI 30.73 kg/m     Review of Systems  Constitutional: Negative.   HENT: Negative for congestion, dental problem, hearing loss, rhinorrhea, sinus pressure, sore throat and tinnitus.  Eyes: Negative for pain, discharge and visual disturbance.  Respiratory: Negative for cough and shortness of breath.   Cardiovascular: Negative for chest pain, palpitations and leg swelling.  Gastrointestinal: Negative for abdominal distention, abdominal pain, blood in stool, constipation, diarrhea, nausea and vomiting.  Genitourinary: Negative for difficulty urinating, dysuria, flank pain, frequency, hematuria, pelvic pain, urgency, vaginal bleeding, vaginal discharge and vaginal pain.  Musculoskeletal: Negative for arthralgias, gait problem and joint swelling.  Skin: Positive for rash.  Neurological: Negative for dizziness, syncope, speech difficulty, weakness, numbness and headaches.  Hematological: Negative for adenopathy.  Psychiatric/Behavioral: Negative for agitation, behavioral problems and dysphoric mood. The patient is not nervous/anxious.        Objective:   Physical Exam  Constitutional: She appears well-developed and well-nourished. No distress.  Blood pressure and pulse, well-controlled   Skin:  Patchy areas of nonblanching erythema measuring as wide as 5 centimeters in diameter over the anterior aspects of both lower legs.  The left slightly more prominent than the right  No pain or excessive warmth.  No calf tenderness          Assessment & Plan:   Resolving dermatitis anterior aspects of both lower legs. Patient is asymptomatic and rash clearly seems to be fading.  Will  observe at this point Atrial fibrillation Chronic Coumadin anticoagulation  Follow-up PCP if unimproved Flu vaccine administered at her extended care facility  [facility]

## 2017-07-21 NOTE — Patient Instructions (Signed)
Call or return to clinic prn if these symptoms worsen or fail to improve as anticipated.

## 2017-07-25 DIAGNOSIS — I482 Chronic atrial fibrillation: Secondary | ICD-10-CM | POA: Diagnosis not present

## 2017-07-25 DIAGNOSIS — M545 Low back pain: Secondary | ICD-10-CM | POA: Diagnosis not present

## 2017-07-25 DIAGNOSIS — R2681 Unsteadiness on feet: Secondary | ICD-10-CM | POA: Diagnosis not present

## 2017-07-25 DIAGNOSIS — M6281 Muscle weakness (generalized): Secondary | ICD-10-CM | POA: Diagnosis not present

## 2017-07-25 DIAGNOSIS — Z9181 History of falling: Secondary | ICD-10-CM | POA: Diagnosis not present

## 2017-07-25 DIAGNOSIS — R262 Difficulty in walking, not elsewhere classified: Secondary | ICD-10-CM | POA: Diagnosis not present

## 2017-07-26 DIAGNOSIS — M62838 Other muscle spasm: Secondary | ICD-10-CM | POA: Diagnosis not present

## 2017-07-26 DIAGNOSIS — I482 Chronic atrial fibrillation: Secondary | ICD-10-CM | POA: Diagnosis not present

## 2017-07-26 DIAGNOSIS — Z9181 History of falling: Secondary | ICD-10-CM | POA: Diagnosis not present

## 2017-07-26 DIAGNOSIS — M47816 Spondylosis without myelopathy or radiculopathy, lumbar region: Secondary | ICD-10-CM | POA: Diagnosis not present

## 2017-07-26 DIAGNOSIS — R2681 Unsteadiness on feet: Secondary | ICD-10-CM | POA: Diagnosis not present

## 2017-07-26 DIAGNOSIS — M5136 Other intervertebral disc degeneration, lumbar region: Secondary | ICD-10-CM | POA: Diagnosis not present

## 2017-07-26 DIAGNOSIS — M545 Low back pain: Secondary | ICD-10-CM | POA: Diagnosis not present

## 2017-07-26 DIAGNOSIS — R262 Difficulty in walking, not elsewhere classified: Secondary | ICD-10-CM | POA: Diagnosis not present

## 2017-07-26 DIAGNOSIS — M6281 Muscle weakness (generalized): Secondary | ICD-10-CM | POA: Diagnosis not present

## 2017-07-27 DIAGNOSIS — R262 Difficulty in walking, not elsewhere classified: Secondary | ICD-10-CM | POA: Diagnosis not present

## 2017-07-27 DIAGNOSIS — M545 Low back pain: Secondary | ICD-10-CM | POA: Diagnosis not present

## 2017-07-27 DIAGNOSIS — I482 Chronic atrial fibrillation: Secondary | ICD-10-CM | POA: Diagnosis not present

## 2017-07-27 DIAGNOSIS — R2681 Unsteadiness on feet: Secondary | ICD-10-CM | POA: Diagnosis not present

## 2017-07-27 DIAGNOSIS — M6281 Muscle weakness (generalized): Secondary | ICD-10-CM | POA: Diagnosis not present

## 2017-07-27 DIAGNOSIS — Z9181 History of falling: Secondary | ICD-10-CM | POA: Diagnosis not present

## 2017-07-28 DIAGNOSIS — R2681 Unsteadiness on feet: Secondary | ICD-10-CM | POA: Diagnosis not present

## 2017-07-28 DIAGNOSIS — R262 Difficulty in walking, not elsewhere classified: Secondary | ICD-10-CM | POA: Diagnosis not present

## 2017-07-28 DIAGNOSIS — Z9181 History of falling: Secondary | ICD-10-CM | POA: Diagnosis not present

## 2017-07-28 DIAGNOSIS — M545 Low back pain: Secondary | ICD-10-CM | POA: Diagnosis not present

## 2017-07-29 DIAGNOSIS — R2681 Unsteadiness on feet: Secondary | ICD-10-CM | POA: Diagnosis not present

## 2017-07-29 DIAGNOSIS — M545 Low back pain: Secondary | ICD-10-CM | POA: Diagnosis not present

## 2017-07-29 DIAGNOSIS — R262 Difficulty in walking, not elsewhere classified: Secondary | ICD-10-CM | POA: Diagnosis not present

## 2017-07-29 DIAGNOSIS — Z9181 History of falling: Secondary | ICD-10-CM | POA: Diagnosis not present

## 2017-08-02 DIAGNOSIS — R2681 Unsteadiness on feet: Secondary | ICD-10-CM | POA: Diagnosis not present

## 2017-08-02 DIAGNOSIS — Z9181 History of falling: Secondary | ICD-10-CM | POA: Diagnosis not present

## 2017-08-02 DIAGNOSIS — R262 Difficulty in walking, not elsewhere classified: Secondary | ICD-10-CM | POA: Diagnosis not present

## 2017-08-02 DIAGNOSIS — M545 Low back pain: Secondary | ICD-10-CM | POA: Diagnosis not present

## 2017-08-03 ENCOUNTER — Other Ambulatory Visit: Payer: Self-pay | Admitting: Family Medicine

## 2017-08-03 ENCOUNTER — Other Ambulatory Visit: Payer: Self-pay

## 2017-08-03 ENCOUNTER — Ambulatory Visit (INDEPENDENT_AMBULATORY_CARE_PROVIDER_SITE_OTHER): Payer: Medicare Other | Admitting: Family Medicine

## 2017-08-03 ENCOUNTER — Encounter: Payer: Self-pay | Admitting: Family Medicine

## 2017-08-03 ENCOUNTER — Telehealth: Payer: Self-pay

## 2017-08-03 ENCOUNTER — Ambulatory Visit: Payer: Medicare Other

## 2017-08-03 ENCOUNTER — Ambulatory Visit: Payer: Medicare Other | Admitting: Family Medicine

## 2017-08-03 ENCOUNTER — Ambulatory Visit (INDEPENDENT_AMBULATORY_CARE_PROVIDER_SITE_OTHER): Payer: Medicare Other | Admitting: General Practice

## 2017-08-03 VITALS — BP 118/74 | HR 78 | Temp 97.5°F | Wt 197.0 lb

## 2017-08-03 DIAGNOSIS — M545 Low back pain, unspecified: Secondary | ICD-10-CM

## 2017-08-03 DIAGNOSIS — I4891 Unspecified atrial fibrillation: Secondary | ICD-10-CM

## 2017-08-03 DIAGNOSIS — Z7901 Long term (current) use of anticoagulants: Secondary | ICD-10-CM

## 2017-08-03 LAB — POC URINALSYSI DIPSTICK (AUTOMATED)
Bilirubin, UA: NEGATIVE
Blood, UA: NEGATIVE
Glucose, UA: NEGATIVE
Ketones, UA: NEGATIVE
LEUKOCYTES UA: NEGATIVE
NITRITE UA: NEGATIVE
PH UA: 6 (ref 5.0–8.0)
PROTEIN UA: NEGATIVE
Spec Grav, UA: 1.015 (ref 1.010–1.025)
UROBILINOGEN UA: 0.2 U/dL

## 2017-08-03 LAB — POCT INR: INR: 1.8

## 2017-08-03 MED ORDER — HYDROCODONE-ACETAMINOPHEN 7.5-300 MG PO TABS: ORAL_TABLET | ORAL | 0 refills | Status: DC

## 2017-08-03 MED ORDER — HYDROCODONE-ACETAMINOPHEN 7.5-325 MG PO TABS: 1.0000 | ORAL_TABLET | Freq: Four times a day (QID) | ORAL | 0 refills | Status: DC | PRN

## 2017-08-03 MED ORDER — HYDROCODONE-ACETAMINOPHEN 7.5-300 MG PO TABS
ORAL_TABLET | ORAL | 0 refills | Status: DC
Start: 2017-08-03 — End: 2017-08-15

## 2017-08-03 NOTE — Telephone Encounter (Signed)
Prescription faxed and confirmed 

## 2017-08-03 NOTE — Patient Instructions (Signed)
Stay in the position that you are most comfortable when  Stop the Tylenol and the tramadol  Vicodin,,,,,,,, 1/2-1 tablet every 4-6 hours for severe pain  Call your spine specialist Dr. Herma Mering for evaluation ASAP

## 2017-08-03 NOTE — Telephone Encounter (Signed)
Rx has been changed, printed and is waiting for Dr Elease Hashimoto to sign.

## 2017-08-03 NOTE — Telephone Encounter (Signed)
They do not do the Hydrocodone-Acetaminophen (VICODIN ES) 7.5-300 MG TABS  Can you change to 7.5-325 ? Pharmacy says ok to fax if you will mail the hard copy to them within a week.

## 2017-08-03 NOTE — Telephone Encounter (Signed)
Pt was just seen by dr Sherren Mocha this morning, pt was given an Rx for Hydrocodone and pharmacy needs clarification on the medication. Please Advise

## 2017-08-03 NOTE — Telephone Encounter (Signed)
Pt request has been sent to the dr for Advise

## 2017-08-03 NOTE — Patient Instructions (Signed)
Pre visit review using our clinic review tool, if applicable. No additional management support is needed unless otherwise documented below in the visit note. 

## 2017-08-03 NOTE — Telephone Encounter (Addendum)
Angel Meyer at [facility] states they need clarification on the rx Hydrocodone-Acetaminophen (VICODIN ES) 7.5-300 MG TABS  Instructions need to changed from 1/2-1 tablet every 4-6 hours when necessary for severe pain  To either  1/2 tab or 1 tab 4 hrs or 6 hrs.  They cannot do variables

## 2017-08-03 NOTE — Progress Notes (Signed)
Angel Meyer is an 83 year old single female nonsmoker who lives in a extended care facility who is brought in by her daughter acutely for evaluation of back pain  Her primary care physician is Dr. Amanda Cockayne has a long history of back problems. She's had repeated surgery on her lumbar spine. This was done in [place] years ago. She's been followed here in [place] by Dr. Herma Mering. He did trigger injections last week because she was having a lot of muscle spasm. She is on tramadol 50 mg 3 times daily when necessary for pain.  She woke up at 440 this morning and called her daughter was severe pain. She was given an extra tramadol. They were concerned she might have a kidney stone.  Patient says she had a kidney stone 40 years ago but does not recall what the pain was like her whether was left or right.  She points to the right and left side of her lower back as a source for pain. She says it's sharp severe an 8 on a scale of 1-10 and unrelieved by tramadol. No radiation. No bowel or bladder dysfunction. Legs feel normal except for the chronic venous insufficiency and edema  BP 118/74 (BP Location: Left Arm, Patient Position: Sitting, Cuff Size: Large)   Pulse 78   Temp (!) 97.5 F (36.4 C) (Oral)   Wt 197 lb (89.4 kg)   SpO2 97%   BMI 30.85 kg/m  General she's well-developed well-nourished female sitting quietly does not appear to be in acute pain. In the standing position inspection of the back shows a scar in the lumbar area 8 inches in length from previous back surgeries. There is palpable tenderness bilaterally.  Urinalysis normal  Impression #1 low back pain related to lumbar disc disease........ Plan....... pain control today. Daughter will call Dr. Herma Mering her back surgeon for consultation ASAP

## 2017-08-03 NOTE — Telephone Encounter (Signed)
Have them change to one tablet every 6 hours as needed for pain.

## 2017-08-04 ENCOUNTER — Telehealth: Payer: Self-pay

## 2017-08-04 DIAGNOSIS — M47816 Spondylosis without myelopathy or radiculopathy, lumbar region: Secondary | ICD-10-CM | POA: Diagnosis not present

## 2017-08-04 NOTE — Telephone Encounter (Signed)
Rx was mailed out to [facility] per the pharmacy request.

## 2017-08-04 NOTE — Telephone Encounter (Signed)
Please resend fax to   [facility] [place] [facility] [place] [phone] (Phone) [phone] (Fax)   Went to [facility] and they cannot do.

## 2017-08-04 NOTE — Telephone Encounter (Signed)
Patient Nursing home dropped the previous hydrocodone script to our office this afternoon.

## 2017-08-04 NOTE — Telephone Encounter (Signed)
Rx was mailed to the address provided by [facility].

## 2017-08-05 ENCOUNTER — Other Ambulatory Visit: Payer: Self-pay | Admitting: Internal Medicine

## 2017-08-05 DIAGNOSIS — M5136 Other intervertebral disc degeneration, lumbar region: Secondary | ICD-10-CM | POA: Diagnosis not present

## 2017-08-08 DIAGNOSIS — Z9181 History of falling: Secondary | ICD-10-CM | POA: Diagnosis not present

## 2017-08-08 DIAGNOSIS — R262 Difficulty in walking, not elsewhere classified: Secondary | ICD-10-CM | POA: Diagnosis not present

## 2017-08-08 DIAGNOSIS — R2681 Unsteadiness on feet: Secondary | ICD-10-CM | POA: Diagnosis not present

## 2017-08-08 DIAGNOSIS — M545 Low back pain: Secondary | ICD-10-CM | POA: Diagnosis not present

## 2017-08-09 DIAGNOSIS — R262 Difficulty in walking, not elsewhere classified: Secondary | ICD-10-CM | POA: Diagnosis not present

## 2017-08-09 DIAGNOSIS — Z9181 History of falling: Secondary | ICD-10-CM | POA: Diagnosis not present

## 2017-08-09 DIAGNOSIS — R2681 Unsteadiness on feet: Secondary | ICD-10-CM | POA: Diagnosis not present

## 2017-08-09 DIAGNOSIS — M545 Low back pain: Secondary | ICD-10-CM | POA: Diagnosis not present

## 2017-08-10 ENCOUNTER — Ambulatory Visit: Payer: Medicare Other | Admitting: Internal Medicine

## 2017-08-12 ENCOUNTER — Emergency Department (HOSPITAL_COMMUNITY): Payer: Medicare Other

## 2017-08-12 ENCOUNTER — Emergency Department (HOSPITAL_COMMUNITY)
Admission: EM | Admit: 2017-08-12 | Discharge: 2017-08-13 | Disposition: A | Discharge status: Skilled Nursing Facility | Payer: Medicare Other | Attending: Emergency Medicine | Admitting: Emergency Medicine

## 2017-08-12 ENCOUNTER — Other Ambulatory Visit: Payer: Self-pay

## 2017-08-12 ENCOUNTER — Encounter (HOSPITAL_COMMUNITY): Payer: Self-pay

## 2017-08-12 DIAGNOSIS — M546 Pain in thoracic spine: Secondary | ICD-10-CM | POA: Insufficient documentation

## 2017-08-12 DIAGNOSIS — I10 Essential (primary) hypertension: Secondary | ICD-10-CM | POA: Diagnosis not present

## 2017-08-12 DIAGNOSIS — Z79899 Other long term (current) drug therapy: Secondary | ICD-10-CM | POA: Insufficient documentation

## 2017-08-12 DIAGNOSIS — R0781 Pleurodynia: Secondary | ICD-10-CM | POA: Diagnosis not present

## 2017-08-12 DIAGNOSIS — R109 Unspecified abdominal pain: Secondary | ICD-10-CM | POA: Insufficient documentation

## 2017-08-12 DIAGNOSIS — R1011 Right upper quadrant pain: Secondary | ICD-10-CM | POA: Diagnosis not present

## 2017-08-12 DIAGNOSIS — R0789 Other chest pain: Secondary | ICD-10-CM | POA: Diagnosis present

## 2017-08-12 DIAGNOSIS — R079 Chest pain, unspecified: Secondary | ICD-10-CM | POA: Diagnosis not present

## 2017-08-12 LAB — I-STAT CHEM 8, ED
BUN: 21 mg/dL — AB (ref 6–20)
CHLORIDE: 104 mmol/L (ref 101–111)
CREATININE: 1.2 mg/dL — AB (ref 0.44–1.00)
Calcium, Ion: 1.08 mmol/L — ABNORMAL LOW (ref 1.15–1.40)
GLUCOSE: 92 mg/dL (ref 65–99)
HEMATOCRIT: 46 % (ref 36.0–46.0)
Hemoglobin: 15.6 g/dL — ABNORMAL HIGH (ref 12.0–15.0)
POTASSIUM: 3.7 mmol/L (ref 3.5–5.1)
Sodium: 139 mmol/L (ref 135–145)
TCO2: 23 mmol/L (ref 22–32)

## 2017-08-12 MED ORDER — ACETAMINOPHEN 500 MG PO TABS
1000.0000 mg | ORAL_TABLET | Freq: Once | ORAL | Status: AC
  Administered 2017-08-12: 1000 mg via ORAL
  Filled 2017-08-12: qty 2

## 2017-08-12 MED ORDER — MORPHINE SULFATE (PF) 2 MG/ML IV SOLN
2.0000 mg | Freq: Once | INTRAVENOUS | Status: AC
  Administered 2017-08-13: 2 mg via INTRAVENOUS
  Filled 2017-08-12: qty 1

## 2017-08-12 NOTE — ED Notes (Signed)
Bed: YK59 Expected date:  Expected time:  Means of arrival:  Comments: EMS 83 yo female from SNF-right upper abdominal pain and right rib pain

## 2017-08-12 NOTE — ED Provider Notes (Signed)
[facility] DEPT Provider Note   CSN: [id number] Arrival date & time: 08/12/17  2307     History   Chief Complaint Chief Complaint  Patient presents with  . Abdominal Pain    HPI Angel Meyer is a 83 y.o. female.  83 yo F with a chief complaint of right chest wall pain.  Going on since yesterday.  Describes a sharp and shooting.  Worse with movement palpation and deep breathing.  Patient denies trauma denies cough or fever.  Denies nausea or vomiting.  Has been getting progressively worse throughout the day.  Nothing seems to make it better.   The history is provided by the patient.  Abdominal Pain   Associated symptoms include arthralgias and myalgias. Pertinent negatives include fever, nausea, vomiting, dysuria and headaches.  Illness  This is a new problem. The current episode started yesterday. The problem occurs constantly. The problem has been rapidly worsening. Associated symptoms include chest pain and abdominal pain. Pertinent negatives include no headaches and no shortness of breath. The symptoms are aggravated by bending and twisting. Nothing relieves the symptoms. She has tried nothing for the symptoms. The treatment provided no relief.    Past Medical History:  Diagnosis Date  . Arthritis   . Diastolic dysfunction    preserved EF,  CHF In setting of afib previously  . History of blood transfusion   . Hyperlipidemia   . Hypertension   . Incontinence of urine   . OSA (obstructive sleep apnea)    does not use CPAP  . Osteoporosis   . Persistent atrial fibrillation (HCC)    failed on Flecainide, Tikosyn, and amiodarone  . Renal calculi   . Restless legs   . Rheumatic fever    age 89  . Systolic heart failure (HCC)    EF 30 to 35% per echo 08/2013,  subsequently has normalized    Patient Active Problem List   Diagnosis Date Noted  . Long term (current) use of anticoagulants 06/08/2017  . Lichen simplex chronicus 04/29/2017  .  Risk for falls 12/17/2015  . Urinary urgency 10/29/2014  . Cognitive impairment 07/18/2014  . Encounter for therapeutic drug monitoring 10/18/2013  . Lung nodule 10/03/2013  . CHF (congestive heart failure) ([place]) 09/08/2013  . Cough 08/07/2013  . Low blood pressure reading 08/07/2013  . Cerumen impaction 08/07/2013  . Anticoagulant long-term use 07/27/2013  . Acute upper respiratory infections of unspecified site 07/27/2013  . Wheezy bronchitis 07/27/2013  . OSA (obstructive sleep apnea) 12/20/2011  . Hypertension 12/20/2011  . Hyperlipidemia 12/20/2011  . Osteoarthritis 12/20/2011  . History of kidney stones 12/20/2011  . Osteoporosis 12/20/2011  . Urinary incontinence, urge 12/20/2011  . Restless leg syndrome 12/20/2011  . Atrial fibrillation ([place]) 12/20/2011    Past Surgical History:  Procedure Laterality Date  . ABDOMINAL HYSTERECTOMY    . BREAST SURGERY     benign biopsy  . CANCELLED PROCEDURE  04/28/2017   Performed by Josue Hector, MD at [facility] ENDOSCOPY  . CARDIOVERSION N/A 05/13/2017   Performed by Lelon Perla, MD at [place]  . CARDIOVERSION N/A 03/07/2017   Performed by Dorothy Spark, MD at [place]  . CARDIOVERSION N/A 08/18/2015   Performed by Sueanne Margarita, MD at [facility] ENDOSCOPY  . CARDIOVERSION N/A 11/16/2013   Performed by Sueanne Margarita, MD at [facility] ENDOSCOPY  . CARDIOVERSION N/A 09/13/2013   Performed by Lelon Perla, MD at [place]  . CARDIOVERSION  N/A 09/10/2013   Performed by Thompson Grayer, MD at [facility] OR  . IR RADIOLOGIST EVAL & MGMT  02/04/2017  . IR VERTEBROPLASTY LUMBAR BX INC UNI/BIL INC/INJECT/IMAGING  02/10/2017  . [place] removed  . REPLACEMENT TOTAL KNEE  2005  . SPINE SURGERY     laminectomy 1965, spinal fusion with rod 2005  . TONSILLECTOMY    . TRANSESOPHAGEAL ECHOCARDIOGRAM (TEE) N/A 05/13/2017   Performed by Lelon Perla, MD at [place]  . TRANSESOPHAGEAL ECHOCARDIOGRAM (TEE) N/A  03/07/2017   Performed by Dorothy Spark, MD at [place]  . TRANSESOPHAGEAL ECHOCARDIOGRAM (TEE) N/A 09/13/2013   Performed by Lelon Perla, MD at [facility] ENDOSCOPY    OB History    No data available       Home Medications    Prior to Admission medications   Medication Sig Start Date End Date Taking? Authorizing Provider  acetaminophen (TYLENOL) 500 MG tablet Take 500 mg 2 (two) times daily by mouth.   Yes [provider]  diltiazem (CARDIZEM CD) 240 MG 24 hr capsule TAKE (1) CAPSULE BY MOUTH TWICE DAILY. 08/05/17  Yes Allred, Jeneen Rinks, MD  diltiazem (CARDIZEM) 30 MG tablet Take 30 mg every 6 (six) hours as needed by mouth (A-FIB). For Blood pressure    Yes [provider]  docusate sodium (COLACE) 100 MG capsule Take 100 mg 2 (two) times daily by mouth.   Yes [provider]  donepezil (ARICEPT ODT) 10 MG disintegrating tablet Take 1 tablet (10 mg total) by mouth at bedtime. 06/29/16  Yes Burchette, Alinda Sierras, MD  escitalopram (LEXAPRO) 10 MG tablet TAKE 1 TABLET EVERY DAY 03/08/17  Yes Burchette, Alinda Sierras, MD  furosemide (LASIX) 40 MG tablet Take 1 tablet (40 mg total) by mouth daily. 12/07/16  Yes Burtis Junes, NP  HYDROcodone-acetaminophen (NORCO) 7.5-325 MG tablet Take 1 tablet every 6 (six) hours as needed by mouth for moderate pain. Patient taking differently: Take 1 tablet at bedtime as needed by mouth for moderate pain or severe pain.  08/03/17  Yes Burchette, Alinda Sierras, MD  Hydrocodone-Acetaminophen (VICODIN ES) 7.5-300 MG TABS Take 1 tablet every 6 hours as needed for severe pain. Patient taking differently: Take 1 tablet 3 (three) times daily by mouth.  08/03/17  Yes Burchette, Alinda Sierras, MD  magnesium gluconate (MAGONATE) 500 MG tablet Take 500 mg at bedtime by mouth.   Yes [provider]  Melatonin 5 MG TABS Take 5 mg at bedtime by mouth.    Yes [provider]  memantine (NAMENDA) 10 MG tablet Take 10 mg 2 (two) times daily by  mouth.   Yes [provider]  Misc. Devices (ROLLER [place]) MISC Used as Directed. 01/12/16  Yes Burchette, Alinda Sierras, MD  Multiple Vitamins-Minerals (PRESERVISION AREDS) CAPS Take 1 capsule by mouth 2 (two) times daily.   Yes [provider]  nitroGLYCERIN (NITROSTAT) 0.4 MG SL tablet Place 1 tablet (0.4 mg total) under the tongue every 5 (five) minutes as needed for chest pain. 12/07/16  Yes Burtis Junes, NP  oxybutynin (DITROPAN-XL) 10 MG 24 hr tablet Take 10 mg by mouth daily.  05/09/15  Yes [provider]  rOPINIRole (REQUIP) 1 MG tablet Take 1 tablet (1 mg total) by mouth See admin instructions. Take 1 tablet (1 mg) by mouth daily at 4pm and at bedtime Patient taking differently: Take 1 mg 2 (two) times daily by mouth. Take 1 tablet (  1 mg) by mouth daily at 4pm and at bedtime 8 pm 06/29/16  Yes Burchette, Alinda Sierras, MD  rosuvastatin (CRESTOR) 40 MG tablet Take 1 tablet (40 mg total) by mouth at bedtime. 12/07/16  Yes Burtis Junes, NP  spironolactone (ALDACTONE) 25 MG tablet Take 1 tablet (25 mg total) by mouth daily. 12/07/16  Yes Burtis Junes, NP  trimethoprim (TRIMPEX) 100 MG tablet Take 100 mg by mouth daily.  05/18/15  Yes [provider]  trolamine salicylate (ASPERCREME) 10 % cream Apply 1 application every 12 (twelve) hours as needed topically for muscle pain.   Yes [provider]  warfarin (COUMADIN) 2.5 MG tablet Take as directed by anticoagulation clinic. Patient taking differently: Take 2.5 mg by mouth every evening. Take as directed by anticoagulation clinic. 01/25/17  Yes Burchette, Alinda Sierras, MD  diazepam (VALIUM) 2 MG tablet Take 1 tablet (2 mg total) at bedtime as needed by mouth for muscle spasms. 08/13/17   Deno Etienne, DO    Family History Family History  Problem Relation Age of Onset  . Cancer Mother        breast  . Diabetes Mother   . Cancer Paternal Grandfather     Social History Social History   Tobacco Use  .  Smoking status: Never Smoker  . Smokeless tobacco: Never Used  Substance Use Topics  . Alcohol use: No  . Drug use: No     Allergies   Ciprofloxacin; Demerol [meperidine]; Sulfa antibiotics; and Sulfacetamide sodium   Review of Systems Review of Systems  Constitutional: Negative for chills and fever.  HENT: Negative for congestion and rhinorrhea.   Eyes: Negative for redness and visual disturbance.  Respiratory: Negative for shortness of breath and wheezing.   Cardiovascular: Positive for chest pain. Negative for palpitations.  Gastrointestinal: Positive for abdominal pain. Negative for nausea and vomiting.  Genitourinary: Negative for dysuria and urgency.  Musculoskeletal: Positive for arthralgias and myalgias.  Skin: Negative for pallor and wound.  Neurological: Negative for dizziness and headaches.     Physical Exam Updated Vital Signs BP 106/67   Pulse 88   Temp 97.8 F (36.6 C) (Oral)   Resp 18   Ht 5\' 2"  (1.575 m)   Wt 89.4 kg (197 lb)   SpO2 93%   BMI 36.03 kg/m   Physical Exam  Constitutional: She is oriented to person, place, and time. She appears well-developed and well-nourished. No distress.  HENT:  Head: Normocephalic and atraumatic.  Eyes: EOM are normal. Pupils are equal, round, and reactive to light.  Neck: Normal range of motion. Neck supple.  Cardiovascular: Normal rate and regular rhythm. Exam reveals no gallop and no friction rub.  No murmur heard. Pulmonary/Chest: Effort normal. She has no wheezes. She has no rales.  Abdominal: Soft. She exhibits no distension. There is no tenderness.  Musculoskeletal: She exhibits no edema or tenderness.       Arms: Neurological: She is alert and oriented to person, place, and time.  Skin: Skin is warm and dry. She is not diaphoretic.  Psychiatric: She has a normal mood and affect. Her behavior is normal.  Nursing note and vitals reviewed.    ED Treatments / Results  Labs (all labs ordered are listed,  but only abnormal results are displayed) Labs Reviewed  COMPREHENSIVE METABOLIC PANEL - Abnormal; Notable for the following components:      Result Value   BUN 21 (*)    Creatinine, Ser 1.18 (*)  GFR calc non Af Amer 42 (*)    GFR calc Af Amer 48 (*)    All other components within normal limits  URINALYSIS, ROUTINE W REFLEX MICROSCOPIC - Abnormal; Notable for the following components:   APPearance HAZY (*)    Specific Gravity, Urine 1.043 (*)    Ketones, ur 5 (*)    All other components within normal limits  PROTIME-INR - Abnormal; Notable for the following components:   Prothrombin Time 24.9 (*)    All other components within normal limits  I-STAT CHEM 8, ED - Abnormal; Notable for the following components:   BUN 21 (*)    Creatinine, Ser 1.20 (*)    Calcium, Ion 1.08 (*)    Hemoglobin 15.6 (*)    All other components within normal limits  LIPASE, BLOOD  CBC    EKG  EKG Interpretation  Date/Time:  Friday August 12 2017 23:40:53 EST Ventricular Rate:  82 PR Interval:    QRS Duration: 89 QT Interval:  402 QTC Calculation: 434 R Axis:   -17 Text Interpretation:  Atrial fibrillation Borderline left axis deviation Anteroseptal infarct, age indeterminate No significant change since last tracing Confirmed by Deno Etienne [phone]) on 08/13/2017 12:44:18 AM       Radiology Dg Chest 2 View  Result Date: 08/13/2017 CLINICAL DATA:  Acute onset of right upper quadrant abdominal pain and right rib pain. Generalized weakness. EXAM: CHEST  2 VIEW COMPARISON:  Chest radiograph performed 08/13/2015 FINDINGS: The lungs are mildly hypoexpanded. Vascular congestion is noted. Retrocardiac airspace opacity may reflect pneumonia. No definite pleural effusion or pneumothorax is seen. The cardiomediastinal silhouette is borderline enlarged. No acute osseous abnormalities are seen. The patient's right shoulder arthroplasty is grossly unremarkable in appearance. IMPRESSION: Lungs mildly  hypoexpanded. Vascular congestion and borderline cardiomegaly. Retrocardiac airspace opacity may reflect pneumonia. Electronically Signed   By: Garald Balding M.D.   On: 08/13/2017 01:05   Ct Angio Chest Pe W And/or Wo Contrast  Result Date: 08/13/2017 CLINICAL DATA:  82 year old female with right upper abdominal pain radiating to the right ribcage. Concern for pulmonary embolism. EXAM: CT ANGIOGRAPHY CHEST WITH CONTRAST TECHNIQUE: Multidetector CT imaging of the chest was performed using the standard protocol during bolus administration of intravenous contrast. Multiplanar CT image reconstructions and MIPs were obtained to evaluate the vascular anatomy. CONTRAST:  62mL ISOVUE-370 IOPAMIDOL (ISOVUE-370) INJECTION 76% COMPARISON:  Chest radiograph dated 08/13/2017 and CT dated 06/20/2014 FINDINGS: Cardiovascular: There is moderate cardiomegaly with biatrial dilatation. No pericardial effusion. Multi vessel coronary vascular calcification noted. There is moderate atherosclerotic calcification of the thoracic aorta. No aneurysmal dilatation or evidence of dissection. There is no CT evidence of pulmonary embolism. Mediastinum/Nodes: No hilar or mediastinal adenopathy. Esophagus is grossly unremarkable. No mediastinal fluid collection. Lungs/Pleura: There is a 5.5 x 2.5 cm focal consolidative change at the right lung base posteriorly most likely represent round atelectasis. Clinical correlation and follow-up recommended to exclude underlying mass. Linear bibasilar atelectasis/ scarring noted. There is a small left pleural effusion. No pneumothorax. The central airways are patent. Upper Abdomen: No acute abnormality. Musculoskeletal: Osteopenia with degenerative changes of the spine. Right shoulder arthroplasty. Review of the MIP images confirms the above findings. IMPRESSION: 1. No CT evidence of pulmonary embolism. 2. Small left pleural effusion and focal left lung base round atelectasis. Clinical correlation and  follow-up recommended. 3. Cardiomegaly with coronary vascular calcification. 4.  Aortic Atherosclerosis (ICD10-I70.0). Electronically Signed   By: Anner Crete M.D.   On: 08/13/2017 03:32  Procedures Procedures (including critical care time)  Medications Ordered in ED Medications  iopamidol (ISOVUE-370) 76 % injection (not administered)  acetaminophen (TYLENOL) tablet 1,000 mg (1,000 mg Oral Given 08/12/17 2359)  morphine 2 MG/ML injection 2 mg (2 mg Intravenous Given 08/13/17 0001)  iopamidol (ISOVUE-370) 76 % injection 80 mL (80 mLs Intravenous Contrast Given 08/13/17 0303)  diazepam (VALIUM) tablet 2 mg (2 mg Oral Given 08/13/17 0452)  morphine 2 MG/ML injection 2 mg (2 mg Intravenous Given 08/13/17 0450)     Initial Impression / Assessment and Plan / ED Course  I have reviewed the triage vital signs and the nursing notes.  Pertinent labs & imaging results that were available during my care of the patient were reviewed by me and considered in my medical decision making (see chart for details).     83 yo F with a chief complaint of right side pain.  The patient is significantly uncomfortable on my exam.  Will obtain labs chest x-ray and reassess.  Patient reassessed and continues to have what she describes a severe right flank pain.  Continues to be reproduced with palpation.  Awaiting UA to evaluate for possible [place].  CT scan of the chest without PE.  No PNA.    I suspect the pain is most likely musculoskeletal.  UA is negative.  The patient has had an issue with chronic back pain after looking through her notes.  We will have her follow-up with her back specialist.  Have her take her chronic pain medicine.  6:27 AM:  I have discussed the diagnosis/risks/treatment options with the patient and believe the pt to be eligible for discharge home to follow-up with PCP. We also discussed returning to the ED immediately if new or worsening sx occur. We discussed the sx which are most  concerning (e.g., sudden worsening pain, fever, inability to tolerate by mouth) that necessitate immediate return. Medications administered to the patient during their visit and any new prescriptions provided to the patient are listed below.  Medications given during this visit Medications  iopamidol (ISOVUE-370) 76 % injection (not administered)  acetaminophen (TYLENOL) tablet 1,000 mg (1,000 mg Oral Given 08/12/17 2359)  morphine 2 MG/ML injection 2 mg (2 mg Intravenous Given 08/13/17 0001)  iopamidol (ISOVUE-370) 76 % injection 80 mL (80 mLs Intravenous Contrast Given 08/13/17 0303)  diazepam (VALIUM) tablet 2 mg (2 mg Oral Given 08/13/17 0452)  morphine 2 MG/ML injection 2 mg (2 mg Intravenous Given 08/13/17 0450)     The patient appears reasonably screen and/or stabilized for discharge and I doubt any other medical condition or other [facility] requiring further screening, evaluation, or treatment in the ED at this time prior to discharge.    Final Clinical Impressions(s) / ED Diagnoses   Final diagnoses:  Acute right-sided thoracic back pain    ED Discharge Orders        Ordered    diazepam (VALIUM) 2 MG tablet  At bedtime PRN     08/13/17 [place], [place], DO 08/13/17 1572

## 2017-08-12 NOTE — ED Triage Notes (Signed)
Patient arrives by EMS with complaints of right upper abdominal pain that radiates to her right rib cage area and associated pain with inspiration. Patient complains of generalized weakness that started today-peripheral edema with hx CHF. MP atrial fib with controlled rate.

## 2017-08-13 ENCOUNTER — Emergency Department (HOSPITAL_COMMUNITY): Payer: Medicare Other

## 2017-08-13 ENCOUNTER — Encounter (HOSPITAL_COMMUNITY): Payer: Self-pay | Admitting: Radiology

## 2017-08-13 DIAGNOSIS — N390 Urinary tract infection, site not specified: Secondary | ICD-10-CM | POA: Diagnosis not present

## 2017-08-13 DIAGNOSIS — R1011 Right upper quadrant pain: Secondary | ICD-10-CM | POA: Diagnosis not present

## 2017-08-13 DIAGNOSIS — R0781 Pleurodynia: Secondary | ICD-10-CM | POA: Diagnosis not present

## 2017-08-13 DIAGNOSIS — R100 Acute abdomen: Secondary | ICD-10-CM | POA: Diagnosis not present

## 2017-08-13 DIAGNOSIS — M546 Pain in thoracic spine: Secondary | ICD-10-CM | POA: Diagnosis not present

## 2017-08-13 LAB — COMPREHENSIVE METABOLIC PANEL
ALBUMIN: 3.9 g/dL (ref 3.5–5.0)
ALT: 21 U/L (ref 14–54)
ANION GAP: 11 (ref 5–15)
AST: 22 U/L (ref 15–41)
Alkaline Phosphatase: 89 U/L (ref 38–126)
BILIRUBIN TOTAL: 0.9 mg/dL (ref 0.3–1.2)
BUN: 21 mg/dL — ABNORMAL HIGH (ref 6–20)
CHLORIDE: 104 mmol/L (ref 101–111)
CO2: 22 mmol/L (ref 22–32)
Calcium: 9.4 mg/dL (ref 8.9–10.3)
Creatinine, Ser: 1.18 mg/dL — ABNORMAL HIGH (ref 0.44–1.00)
GFR calc Af Amer: 48 mL/min — ABNORMAL LOW (ref >60)
GFR calc non Af Amer: 42 mL/min — ABNORMAL LOW (ref >60)
Glucose, Bld: 90 mg/dL (ref 65–99)
POTASSIUM: 3.8 mmol/L (ref 3.5–5.1)
Sodium: 137 mmol/L (ref 135–145)
TOTAL PROTEIN: 6.8 g/dL (ref 6.5–8.1)

## 2017-08-13 LAB — URINALYSIS, ROUTINE W REFLEX MICROSCOPIC
Bilirubin Urine: NEGATIVE
Glucose, UA: NEGATIVE mg/dL
Hgb urine dipstick: NEGATIVE
KETONES UR: 5 mg/dL — AB
LEUKOCYTES UA: NEGATIVE
NITRITE: NEGATIVE
PH: 6 (ref 5.0–8.0)
Protein, ur: NEGATIVE mg/dL
SPECIFIC GRAVITY, URINE: 1.043 — AB (ref 1.005–1.030)

## 2017-08-13 LAB — CBC
HEMATOCRIT: 42.7 % (ref 36.0–46.0)
HEMOGLOBIN: 14.4 g/dL (ref 12.0–15.0)
MCH: 33.3 pg (ref 26.0–34.0)
MCHC: 33.7 g/dL (ref 30.0–36.0)
MCV: 98.8 fL (ref 78.0–100.0)
Platelets: 177 10*3/uL (ref 150–400)
RBC: 4.32 MIL/uL (ref 3.87–5.11)
RDW: 15.1 % (ref 11.5–15.5)
WBC: 6.3 10*3/uL (ref 4.0–10.5)

## 2017-08-13 LAB — PROTIME-INR
INR: 2.27
Prothrombin Time: 24.9 seconds — ABNORMAL HIGH (ref 11.4–15.2)

## 2017-08-13 LAB — LIPASE, BLOOD: LIPASE: 25 U/L (ref 11–51)

## 2017-08-13 MED ORDER — MORPHINE SULFATE (PF) 2 MG/ML IV SOLN
2.0000 mg | Freq: Once | INTRAVENOUS | Status: AC
  Administered 2017-08-13: 2 mg via INTRAVENOUS
  Filled 2017-08-13: qty 1

## 2017-08-13 MED ORDER — DIAZEPAM 2 MG PO TABS
2.0000 mg | ORAL_TABLET | Freq: Once | ORAL | Status: AC
  Administered 2017-08-13: 2 mg via ORAL
  Filled 2017-08-13: qty 1

## 2017-08-13 MED ORDER — IOPAMIDOL (ISOVUE-370) INJECTION 76%
INTRAVENOUS | Status: AC
  Filled 2017-08-13: qty 100

## 2017-08-13 MED ORDER — DIAZEPAM 2 MG PO TABS: 2.0000 mg | ORAL_TABLET | Freq: Every evening | ORAL | 0 refills | Status: DC | PRN

## 2017-08-13 MED ORDER — IOPAMIDOL (ISOVUE-370) INJECTION 76%
80.0000 mL | Freq: Once | INTRAVENOUS | Status: AC | PRN
  Administered 2017-08-13: 80 mL via INTRAVENOUS

## 2017-08-13 NOTE — ED Notes (Signed)
Pt attempted to use female urinal, but was unable to give [place] a sample. Pt will call out when she can give [place] one.

## 2017-08-15 ENCOUNTER — Encounter: Payer: Self-pay | Admitting: Family Medicine

## 2017-08-15 ENCOUNTER — Ambulatory Visit (INDEPENDENT_AMBULATORY_CARE_PROVIDER_SITE_OTHER): Payer: Medicare Other | Admitting: Family Medicine

## 2017-08-15 ENCOUNTER — Telehealth: Payer: Self-pay | Admitting: Family Medicine

## 2017-08-15 ENCOUNTER — Other Ambulatory Visit: Payer: Self-pay | Admitting: *Deleted

## 2017-08-15 VITALS — BP 110/68 | HR 72 | Temp 98.6°F | Wt 195.5 lb

## 2017-08-15 DIAGNOSIS — M546 Pain in thoracic spine: Secondary | ICD-10-CM

## 2017-08-15 DIAGNOSIS — R2681 Unsteadiness on feet: Secondary | ICD-10-CM | POA: Diagnosis not present

## 2017-08-15 DIAGNOSIS — R262 Difficulty in walking, not elsewhere classified: Secondary | ICD-10-CM | POA: Diagnosis not present

## 2017-08-15 DIAGNOSIS — M545 Low back pain: Secondary | ICD-10-CM | POA: Diagnosis not present

## 2017-08-15 DIAGNOSIS — Z9181 History of falling: Secondary | ICD-10-CM | POA: Diagnosis not present

## 2017-08-15 MED ORDER — TRAMADOL HCL 50 MG PO TABS
50.0000 mg | ORAL_TABLET | Freq: Three times a day (TID) | ORAL | 5 refills | Status: DC
Start: 2017-08-15 — End: 2017-08-15

## 2017-08-15 NOTE — Telephone Encounter (Signed)
Spoke with daughter and she is aware that the appointment to day is only for medication management.

## 2017-08-15 NOTE — Progress Notes (Signed)
Subjective:     Patient ID: Angel Meyer, female   DOB: 03/08/1933, 83 y.o.   MRN: [id number]  HPI Patient is seen as a work in today with some worsening pain basically right thoracic area radiating toward her right abdominal region. Her chronic problems include history of hypertension, CHF, atrial fibrillation, obstructive sleep apnea, osteoarthritis, urinary incontinence, restless leg syndrome, hyperlipidemia, history of kidney stones, and cognitive impairment.  She's been followed by [facility] orthopedics and is currently treated with Vicodin 7.5 mg every 8 hours but is not controlling her pain very well between doses. She has no UTI symptoms. She reportedly had MRI lumbar spine recently which showed no fracture or other acute abnormality. She is getting some type of "trigger point "injections recently which seemed to provide some temporary relief. Patient went to emergency room on November 16 with progressive pain and also complained of some shortness of breath then. She had labs, EKG, chest x-ray which were unremarkable. CT angiography revealed no evidence for pulmonary embolus. She's not any fever. No cough. Pain is rated 10 out of 10 at times and frequently sharp. Worse with changing positions. She's had normal bowel movements and no reported fever. No skin rash. She's had a couple of recent urinalyses which have been normal.  She has scheduled follow-up tomorrow with orthopedics.  Past Medical History:  Diagnosis Date  . Arthritis   . Diastolic dysfunction    preserved EF,  CHF In setting of afib previously  . History of blood transfusion   . Hyperlipidemia   . Hypertension   . Incontinence of urine   . OSA (obstructive sleep apnea)    does not use CPAP  . Osteoporosis   . Persistent atrial fibrillation (HCC)    failed on Flecainide, Tikosyn, and amiodarone  . Renal calculi   . Restless legs   . Rheumatic fever    age 30  . Systolic heart failure (HCC)    EF 30 to 35% per echo  08/2013,  subsequently has normalized   Past Surgical History:  Procedure Laterality Date  . ABDOMINAL HYSTERECTOMY    . BREAST SURGERY     benign biopsy  . CANCELLED PROCEDURE  04/28/2017   Performed by Josue Hector, MD at [facility] ENDOSCOPY  . CARDIOVERSION N/A 05/13/2017   Performed by Lelon Perla, MD at [place]  . CARDIOVERSION N/A 03/07/2017   Performed by Dorothy Spark, MD at [place]  . CARDIOVERSION N/A 08/18/2015   Performed by Sueanne Margarita, MD at [facility] ENDOSCOPY  . CARDIOVERSION N/A 11/16/2013   Performed by Sueanne Margarita, MD at [facility] ENDOSCOPY  . CARDIOVERSION N/A 09/13/2013   Performed by Lelon Perla, MD at [place]  . CARDIOVERSION N/A 09/10/2013   Performed by Thompson Grayer, MD at [facility] OR  . IR RADIOLOGIST EVAL & MGMT  02/04/2017  . IR VERTEBROPLASTY LUMBAR BX INC UNI/BIL INC/INJECT/IMAGING  02/10/2017  . [place] removed  . REPLACEMENT TOTAL KNEE  2005  . SPINE SURGERY     laminectomy 1965, spinal fusion with rod 2005  . TONSILLECTOMY    . TRANSESOPHAGEAL ECHOCARDIOGRAM (TEE) N/A 05/13/2017   Performed by Lelon Perla, MD at [place]  . TRANSESOPHAGEAL ECHOCARDIOGRAM (TEE) N/A 03/07/2017   Performed by Dorothy Spark, MD at [place]  . TRANSESOPHAGEAL ECHOCARDIOGRAM (TEE) N/A 09/13/2013   Performed by Lelon Perla, MD at [place]    reports that  has never smoked. she has never used smokeless tobacco. She reports that she does not drink alcohol or use drugs. family history includes Cancer in her mother and paternal grandfather; Diabetes in her mother. Allergies  Allergen Reactions  . Ciprofloxacin Nausea And Vomiting  . Demerol [Meperidine] Nausea And Vomiting  . Sulfa Antibiotics Nausea And Vomiting    And all derivatives -  GI Bleeding  . Sulfacetamide Sodium Nausea And Vomiting     Review of Systems  Constitutional: Negative for chills and fever.  Respiratory: Negative for cough,  shortness of breath and wheezing.   Cardiovascular: Negative for chest pain.  Gastrointestinal: Negative for blood in stool and vomiting.  Genitourinary: Negative for dysuria.  Musculoskeletal: Positive for back pain.       Objective:   Physical Exam  Constitutional: She is oriented to person, place, and time. She appears well-developed and well-nourished.  Cardiovascular: Normal rate and regular rhythm.  Pulmonary/Chest: Effort normal and breath sounds normal. No respiratory distress. She has no wheezes. She has no rales.  Abdominal: Soft. She exhibits no mass. There is no tenderness. There is no rebound and no guarding.  Neurological: She is alert and oriented to person, place, and time. No cranial nerve deficit.  Skin: No rash noted.  Psychiatric: She has a normal mood and affect.       Assessment:     Patient seen with some progressive pains mostly on her right lower thoracic region with some radiation anterior. This seems to follow somewhat of a dermatome distribution but she's not had any rash to suggest obvious shingles.    Plan:     -For the next 24 hours only we have written where she can increase her Vicodin 7.5 mg to every 4 hours as needed for pain -She has follow-up scheduled already tomorrow with orthopedics -Consider CT abdomen and pelvis if symptoms persist and follow-up immediately for any skin rash or other new symptoms  [name] MD [facility]

## 2017-08-15 NOTE — Telephone Encounter (Signed)
Copied from [place] [phone]. Topic: Appointment Scheduling - Scheduling Inquiry for Clinic >> Aug 15, 2017 11:08 AM Scherrie Gerlach wrote: Reason for CRM: **daughter states  pt is in severe pain. Went to ED on Friday night.  Has an appt on Wed, but [name] states pt is in so much pain she cannot wait until then. Dr Elease Hashimoto had a 2:45 I put on hold, but not a 30 min.  Will you check to see if he will see her then? No one else has anything.  Thank you!

## 2017-08-15 NOTE — Telephone Encounter (Signed)
If necessary we can try to get her in to 15 minute slot- but 30 minutes is more ideal..  Does she have any hydrocodone left over from recent rx?  If so, could try that since the Tramadol does not seem to provide any relief.

## 2017-08-16 ENCOUNTER — Telehealth: Payer: Self-pay | Admitting: Family Medicine

## 2017-08-16 DIAGNOSIS — M5136 Other intervertebral disc degeneration, lumbar region: Secondary | ICD-10-CM | POA: Diagnosis not present

## 2017-08-16 DIAGNOSIS — R2681 Unsteadiness on feet: Secondary | ICD-10-CM | POA: Diagnosis not present

## 2017-08-16 DIAGNOSIS — M545 Low back pain: Secondary | ICD-10-CM | POA: Diagnosis not present

## 2017-08-16 DIAGNOSIS — Z9181 History of falling: Secondary | ICD-10-CM | POA: Diagnosis not present

## 2017-08-16 DIAGNOSIS — R262 Difficulty in walking, not elsewhere classified: Secondary | ICD-10-CM | POA: Diagnosis not present

## 2017-08-16 NOTE — Telephone Encounter (Signed)
Spoke with [name].  The orthopedist thinks that the pain may be coming from an old compression fracture in her back.  She is going to try gabapentin at bedtime.  She does have an appointment with Dr Nelva Bush next Tuesday.  Should she wait until the appointment before getting the ultrasound of the abdomen?  Should she keep her appointment with you tomorrow?

## 2017-08-16 NOTE — Telephone Encounter (Signed)
Let's wait on appt unless pain gets worse.  Would also hold on Ultrasound for now.

## 2017-08-16 NOTE — Telephone Encounter (Signed)
Copied from [place] #[id number]. Topic: Quick Communication - See Telephone Encounter >> Aug 16, 2017  3:19 PM [name] wrote: CRM for notification. See Telephone encounter for:  Angel Meyer to Dr Elease Hashimoto, wanted to speak to [facility],   [phone]  [name]  This is in regards to appt ? For tomorrow.  08/16/17.

## 2017-08-17 ENCOUNTER — Inpatient Hospital Stay: Payer: Medicare Other | Admitting: Family Medicine

## 2017-08-17 DIAGNOSIS — R262 Difficulty in walking, not elsewhere classified: Secondary | ICD-10-CM | POA: Diagnosis not present

## 2017-08-17 DIAGNOSIS — Z9181 History of falling: Secondary | ICD-10-CM | POA: Diagnosis not present

## 2017-08-17 DIAGNOSIS — M545 Low back pain: Secondary | ICD-10-CM | POA: Diagnosis not present

## 2017-08-17 DIAGNOSIS — R2681 Unsteadiness on feet: Secondary | ICD-10-CM | POA: Diagnosis not present

## 2017-08-22 ENCOUNTER — Encounter (HOSPITAL_COMMUNITY): Payer: Self-pay | Admitting: Nurse Practitioner

## 2017-08-22 ENCOUNTER — Ambulatory Visit (HOSPITAL_COMMUNITY)
Admission: RE | Admit: 2017-08-22 | Discharge: 2017-08-22 | Disposition: A | Discharge status: Home / Self Care | Payer: Medicare Other | Source: Ambulatory Visit | Attending: Nurse Practitioner | Admitting: Nurse Practitioner

## 2017-08-22 ENCOUNTER — Ambulatory Visit (HOSPITAL_BASED_OUTPATIENT_CLINIC_OR_DEPARTMENT_OTHER)
Admission: RE | Admit: 2017-08-22 | Discharge: 2017-08-22 | Disposition: A | Discharge status: Home / Self Care | Payer: Medicare Other | Source: Ambulatory Visit | Attending: Nurse Practitioner | Admitting: Nurse Practitioner

## 2017-08-22 VITALS — BP 116/68 | HR 88 | Ht 62.0 in | Wt 206.4 lb

## 2017-08-22 DIAGNOSIS — Z9181 History of falling: Secondary | ICD-10-CM | POA: Diagnosis not present

## 2017-08-22 DIAGNOSIS — Z90710 Acquired absence of both cervix and uterus: Secondary | ICD-10-CM | POA: Insufficient documentation

## 2017-08-22 DIAGNOSIS — Z7901 Long term (current) use of anticoagulants: Secondary | ICD-10-CM

## 2017-08-22 DIAGNOSIS — R2681 Unsteadiness on feet: Secondary | ICD-10-CM | POA: Diagnosis not present

## 2017-08-22 DIAGNOSIS — J9811 Atelectasis: Secondary | ICD-10-CM | POA: Insufficient documentation

## 2017-08-22 DIAGNOSIS — Z882 Allergy status to sulfonamides status: Secondary | ICD-10-CM | POA: Insufficient documentation

## 2017-08-22 DIAGNOSIS — G2581 Restless legs syndrome: Secondary | ICD-10-CM | POA: Insufficient documentation

## 2017-08-22 DIAGNOSIS — R0600 Dyspnea, unspecified: Secondary | ICD-10-CM | POA: Diagnosis not present

## 2017-08-22 DIAGNOSIS — Z9889 Other specified postprocedural states: Secondary | ICD-10-CM

## 2017-08-22 DIAGNOSIS — Z79899 Other long term (current) drug therapy: Secondary | ICD-10-CM | POA: Insufficient documentation

## 2017-08-22 DIAGNOSIS — I700 Atherosclerosis of aorta: Secondary | ICD-10-CM

## 2017-08-22 DIAGNOSIS — Z888 Allergy status to other drugs, medicaments and biological substances status: Secondary | ICD-10-CM

## 2017-08-22 DIAGNOSIS — R110 Nausea: Secondary | ICD-10-CM | POA: Diagnosis not present

## 2017-08-22 DIAGNOSIS — K5730 Diverticulosis of large intestine without perforation or abscess without bleeding: Secondary | ICD-10-CM | POA: Diagnosis not present

## 2017-08-22 DIAGNOSIS — Z79891 Long term (current) use of opiate analgesic: Secondary | ICD-10-CM

## 2017-08-22 DIAGNOSIS — K4090 Unilateral inguinal hernia, without obstruction or gangrene, not specified as recurrent: Secondary | ICD-10-CM | POA: Diagnosis not present

## 2017-08-22 DIAGNOSIS — G4733 Obstructive sleep apnea (adult) (pediatric): Secondary | ICD-10-CM | POA: Insufficient documentation

## 2017-08-22 DIAGNOSIS — J984 Other disorders of lung: Secondary | ICD-10-CM

## 2017-08-22 DIAGNOSIS — Z833 Family history of diabetes mellitus: Secondary | ICD-10-CM | POA: Insufficient documentation

## 2017-08-22 DIAGNOSIS — Z885 Allergy status to narcotic agent status: Secondary | ICD-10-CM

## 2017-08-22 DIAGNOSIS — K8020 Calculus of gallbladder without cholecystitis without obstruction: Secondary | ICD-10-CM | POA: Diagnosis not present

## 2017-08-22 DIAGNOSIS — I481 Persistent atrial fibrillation: Secondary | ICD-10-CM | POA: Diagnosis not present

## 2017-08-22 DIAGNOSIS — R0602 Shortness of breath: Secondary | ICD-10-CM | POA: Diagnosis not present

## 2017-08-22 DIAGNOSIS — I5043 Acute on chronic combined systolic (congestive) and diastolic (congestive) heart failure: Secondary | ICD-10-CM | POA: Diagnosis not present

## 2017-08-22 DIAGNOSIS — M549 Dorsalgia, unspecified: Secondary | ICD-10-CM | POA: Insufficient documentation

## 2017-08-22 DIAGNOSIS — I4819 Other persistent atrial fibrillation: Secondary | ICD-10-CM

## 2017-08-22 DIAGNOSIS — I110 Hypertensive heart disease with heart failure: Secondary | ICD-10-CM | POA: Insufficient documentation

## 2017-08-22 DIAGNOSIS — Z803 Family history of malignant neoplasm of breast: Secondary | ICD-10-CM | POA: Insufficient documentation

## 2017-08-22 DIAGNOSIS — I5020 Unspecified systolic (congestive) heart failure: Secondary | ICD-10-CM

## 2017-08-22 DIAGNOSIS — N183 Chronic kidney disease, stage 3 (moderate): Secondary | ICD-10-CM | POA: Diagnosis not present

## 2017-08-22 DIAGNOSIS — M810 Age-related osteoporosis without current pathological fracture: Secondary | ICD-10-CM | POA: Diagnosis not present

## 2017-08-22 DIAGNOSIS — Z96659 Presence of unspecified artificial knee joint: Secondary | ICD-10-CM | POA: Diagnosis not present

## 2017-08-22 DIAGNOSIS — R1011 Right upper quadrant pain: Secondary | ICD-10-CM | POA: Diagnosis not present

## 2017-08-22 DIAGNOSIS — I130 Hypertensive heart and chronic kidney disease with heart failure and stage 1 through stage 4 chronic kidney disease, or unspecified chronic kidney disease: Secondary | ICD-10-CM | POA: Diagnosis not present

## 2017-08-22 DIAGNOSIS — Z66 Do not resuscitate: Secondary | ICD-10-CM | POA: Diagnosis not present

## 2017-08-22 DIAGNOSIS — R262 Difficulty in walking, not elsewhere classified: Secondary | ICD-10-CM | POA: Diagnosis not present

## 2017-08-22 DIAGNOSIS — E785 Hyperlipidemia, unspecified: Secondary | ICD-10-CM

## 2017-08-22 DIAGNOSIS — I509 Heart failure, unspecified: Secondary | ICD-10-CM | POA: Diagnosis not present

## 2017-08-22 DIAGNOSIS — M545 Low back pain: Secondary | ICD-10-CM | POA: Diagnosis not present

## 2017-08-22 DIAGNOSIS — I482 Chronic atrial fibrillation: Secondary | ICD-10-CM | POA: Diagnosis not present

## 2017-08-22 MED ORDER — DILTIAZEM HCL ER COATED BEADS 360 MG PO CP24: 360.0000 mg | ORAL_CAPSULE | Freq: Every day | ORAL | Status: DC

## 2017-08-22 NOTE — Patient Instructions (Addendum)
Increase lasix to 40mg  twice a day for 3 days   Decrease cardizem to 360mg  ONCE A DAY

## 2017-08-22 NOTE — Progress Notes (Addendum)
Primary Care Physician: [name], MD Referring Physician: Dr. Drucie Ip Angel Meyer is a 83 y.o. female with a h/o medicine refractory persistent afib  with rheumatic heart disease that has been hard to rate control. She was seen by Dr. Rayann Heman, 10/5, at which time HR was over 100. Diltiazem was increased from 360 to 480 mg daily. He said if v rates were still poorly rate controlled, he would consider AV nodal ablation.   Her daughter wanted her to be seen urgently today for swelling and shortness of breath. She has also had back pain and was seen in the ER, 11/16,  without any definite Dx. CT of chest neg for PE. She has seen her PCP since then and is scheduled to see orthopedics tomorrow. Gabapentin was started in the last few weeks but the swelling predated gabapentin. Her weight when she saw Dr. Rayann Heman was 189 lbs and it is 206 lbs today. Slight cough, but no fever chills.  Today, she denies symptoms of palpitations, chest pain, , orthopnea, PND, lower extremity edema, dizziness, presyncope, syncope, or neurologic sequela. + for shortness of breath and LLE, abdominal swelling.   Past Medical History:  Diagnosis Date  . Arthritis   . Diastolic dysfunction    preserved EF,  CHF In setting of afib previously  . History of blood transfusion   . Hyperlipidemia   . Hypertension   . Incontinence of urine   . OSA (obstructive sleep apnea)    does not use CPAP  . Osteoporosis   . Persistent atrial fibrillation (HCC)    failed on Flecainide, Tikosyn, and amiodarone  . Renal calculi   . Restless legs   . Rheumatic fever    age 39  . Systolic heart failure (HCC)    EF 30 to 35% per echo 08/2013,  subsequently has normalized   Past Surgical History:  Procedure Laterality Date  . ABDOMINAL HYSTERECTOMY    . BREAST SURGERY     benign biopsy  . CARDIOVERSION N/A 09/10/2013   Procedure: CARDIOVERSION;  Surgeon: Thompson Grayer, MD;  Location: [facility];  Service: Cardiovascular;   Laterality: N/A;  . CARDIOVERSION N/A 11/16/2013   Procedure: CARDIOVERSION;  Surgeon: Sueanne Margarita, MD;  Location: [facility] ENDOSCOPY;  Service: Cardiovascular;  Laterality: N/A;  . CARDIOVERSION N/A 08/18/2015   Procedure: CARDIOVERSION;  Surgeon: Sueanne Margarita, MD;  Location: [facility] ENDOSCOPY;  Service: Cardiovascular;  Laterality: N/A;  . CARDIOVERSION N/A 03/07/2017   Procedure: CARDIOVERSION;  Surgeon: Dorothy Spark, MD;  Location: [facility] ENDOSCOPY;  Service: Cardiovascular;  Laterality: N/A;  . CARDIOVERSION N/A 05/13/2017   Procedure: CARDIOVERSION;  Surgeon: Lelon Perla, MD;  Location: [facility] ENDOSCOPY;  Service: Cardiovascular;  Laterality: N/A;  . IR RADIOLOGIST EVAL & MGMT  02/04/2017  . IR VERTEBROPLASTY LUMBAR BX INC UNI/BIL INC/INJECT/IMAGING  02/10/2017  . [place] removed  . REPLACEMENT TOTAL KNEE  2005  . SPINE SURGERY     laminectomy 1965, spinal fusion with rod 2005  . TEE WITHOUT CARDIOVERSION N/A 03/07/2017   Procedure: TRANSESOPHAGEAL ECHOCARDIOGRAM (TEE);  Surgeon: Dorothy Spark, MD;  Location: [facility] ENDOSCOPY;  Service: Cardiovascular;  Laterality: N/A;  . TEE WITHOUT CARDIOVERSION N/A 05/13/2017   Procedure: TRANSESOPHAGEAL ECHOCARDIOGRAM (TEE);  Surgeon: Lelon Perla, MD;  Location: [facility] ENDOSCOPY;  Service: Cardiovascular;  Laterality: N/A;  . TONSILLECTOMY      Current Outpatient Medications  Medication Sig Dispense Refill  . acetaminophen (TYLENOL)  500 MG tablet Take 500 mg 2 (two) times daily by mouth.    . diazepam (VALIUM) 2 MG tablet Take 1 tablet (2 mg total) at bedtime as needed by mouth for muscle spasms. 5 tablet 0  . diltiazem (CARDIZEM CD) 360 MG 24 hr capsule Take 1 capsule (360 mg total) by mouth daily. 30 capsule   . diltiazem (CARDIZEM) 30 MG tablet Take 30 mg every 6 (six) hours as needed by mouth (A-FIB). For Blood pressure     . docusate sodium (COLACE) 100 MG capsule Take 100 mg 2 (two) times daily by mouth.    . donepezil  (ARICEPT ODT) 10 MG disintegrating tablet Take 1 tablet (10 mg total) by mouth at bedtime. 90 tablet 2  . escitalopram (LEXAPRO) 10 MG tablet TAKE 1 TABLET EVERY DAY 90 tablet 1  . furosemide (LASIX) 40 MG tablet Take 1 tablet (40 mg total) by mouth daily. 90 tablet 3  . gabapentin (NEURONTIN) 100 MG capsule Take 100 mg by mouth at bedtime.    Marland Kitchen HYDROcodone-acetaminophen (NORCO) 7.5-325 MG tablet Take 1 tablet every 6 (six) hours as needed by mouth for moderate pain. (Patient taking differently: Take 1 tablet by mouth at bedtime as needed for moderate pain or severe pain (8am 2 pm and 80 pm with extra dose as needed at bedtime). ) 20 tablet 0  . magnesium gluconate (MAGONATE) 500 MG tablet Take 500 mg at bedtime by mouth.    . Melatonin 5 MG TABS Take 5 mg at bedtime by mouth.     . memantine (NAMENDA) 10 MG tablet Take 10 mg 2 (two) times daily by mouth.    . Misc. Devices (ROLLER [place]) MISC Used as Directed. 1 each 0  . Multiple Vitamins-Minerals (PRESERVISION AREDS) CAPS Take 1 capsule by mouth 2 (two) times daily.    . nitroGLYCERIN (NITROSTAT) 0.4 MG SL tablet Place 1 tablet (0.4 mg total) under the tongue every 5 (five) minutes as needed for chest pain. 25 tablet 3  . oxybutynin (DITROPAN-XL) 10 MG 24 hr tablet Take 10 mg by mouth daily.   11  . rOPINIRole (REQUIP) 1 MG tablet Take 1 tablet (1 mg total) by mouth See admin instructions. Take 1 tablet (1 mg) by mouth daily at 4pm and at bedtime (Patient taking differently: Take 1 mg 2 (two) times daily by mouth. Take 1 tablet (1 mg) by mouth daily at 4pm and at bedtime 8 pm) 180 tablet 2  . rosuvastatin (CRESTOR) 40 MG tablet Take 1 tablet (40 mg total) by mouth at bedtime. 90 tablet 3  . spironolactone (ALDACTONE) 25 MG tablet Take 1 tablet (25 mg total) by mouth daily. 90 tablet 3  . trimethoprim (TRIMPEX) 100 MG tablet Take 100 mg by mouth daily.   11  . trolamine salicylate (ASPERCREME) 10 % cream Apply 1 application every 12 (twelve) hours  as needed topically for muscle pain.    Marland Kitchen warfarin (COUMADIN) 2.5 MG tablet Take as directed by anticoagulation clinic. (Patient taking differently: Take 2.5 mg by mouth every evening. Take as directed by anticoagulation clinic.) 105 tablet 1   No current facility-administered medications for this encounter.     Allergies  Allergen Reactions  . Ciprofloxacin Nausea And Vomiting  . Demerol [Meperidine] Nausea And Vomiting  . Sulfa Antibiotics Nausea And Vomiting    And all derivatives -  GI Bleeding  . Sulfacetamide Sodium Nausea And Vomiting    Social History   Socioeconomic History  .  Marital status: Divorced    Spouse name: Not on file  . Number of children: Not on file  . Years of education: Not on file  . Highest education level: Not on file  Social Needs  . Financial resource strain: Not on file  . Food insecurity - worry: Not on file  . Food insecurity - inability: Not on file  . Transportation needs - medical: Not on file  . Transportation needs - non-medical: Not on file  Occupational History  . Not on file  Tobacco Use  . Smoking status: Never Smoker  . Smokeless tobacco: Never Used  Substance and Sexual Activity  . Alcohol use: No  . Drug use: No  . Sexual activity: Not Currently  Other Topics Concern  . Not on file  Social History Narrative   Lives in [place] alone.  Spends 4-6 months per year in [place] with her daughter.   Retired Network engineer    Family History  Problem Relation Age of Onset  . Cancer Mother        breast  . Diabetes Mother   . Cancer Paternal Grandfather     ROS- All systems are reviewed and negative except as per the HPI above  Physical Exam: Vitals:   08/22/17 1350  BP: 116/68  Pulse: 88  SpO2: 95%  Weight: 206 lb 6.4 oz (93.6 kg)  Height: 5\' 2"  (1.575 m)   Wt Readings from Last 3 Encounters:  08/22/17 206 lb 6.4 oz (93.6 kg)  08/15/17 195 lb 8 oz (88.7 kg)  08/12/17 197 lb (89.4 kg)    Labs: Lab Results    Component Value Date   NA 139 08/12/2017   K 3.7 08/12/2017   CL 104 08/12/2017   CO2 22 08/12/2017   GLUCOSE 92 08/12/2017   BUN 21 (H) 08/12/2017   CREATININE 1.20 (H) 08/12/2017   CALCIUM 9.4 08/12/2017   MG 2.3 09/14/2013   Lab Results  Component Value Date   INR 2.27 08/12/2017   Lab Results  Component Value Date   CHOL 215 (H) 12/07/2016   HDL 78 12/07/2016   LDLCALC 111 (H) 12/07/2016   TRIG 130 12/07/2016     GEN- The patient is well appearing, alert and oriented x 3 today.   Head- normocephalic, atraumatic Eyes-  Sclera clear, conjunctiva pink, puffiness periorbital Ears- hearing intact Oropharynx- clear Neck- supple, no JVP Lymph- no cervical lymphadenopathy Lungs- Clear to ausculation bilaterally, normal work of breathing Heart-irregular rate and rhythm, no murmurs, rubs or gallops, PMI not laterally displaced GI- soft, NT, ND, + BS Extremities- no clubbing, cyanosis, 2+ bilateral edema MS- no significant deformity or atrophy Skin- no rash or lesion Psych- euthymic mood, full affect Neuro- strength and sensation are intact  EKG-afib at 88 bpm, qrs int 84 ms, qtc 435 ms Epic records reviewed CXR repeated today-EXAM:  CHEST  2 VIEW  COMPARISON:  08/13/2017  FINDINGS: Chronic lung disease with prominent lung markings. Left lower lobe airspace disease unchanged. Negative for heart failure or pneumonia. No significant pleural effusion. Mild cardiac enlargement  Right shoulder replacement.  Lumbar fusion hardware.  IMPRESSION: Chronic lung disease with scarring and left lower lobe atelectasis, stable. No interval change.  CT of chest-IMPRESSION: 1. No CT evidence of pulmonary embolism. 2. Small left pleural effusion and focal left lung base round atelectasis. Clinical correlation and follow-up recommended. 3. Cardiomegaly with coronary vascular calcification. 4.  Aortic Atherosclerosis (ICD10-I70.0).  Assessment and Plan: 1. Persistent  afib She seems  that she has been rate controlled on higher dose of cardizem but with  weight gain The daughter states that it has been gradual for several weeks but now it is very uncomfortable for pt. Will increase lasix from 40 mg daily to bid x 3 days, Nursing facility to weigh daily, daughter will call clinic Thursday am will weights and  I will decide if she will go back to usual diuretic dose or  keep at bid for a few more days. Add K+ 20 meq daily while on higher dose of lasix Decrease dilt back to 180 mg bid  2. Back pain Pt is very uncomfortable, has been evaluated in the ER , by PCP and is seeing orthopedics tomorrow  F/u with Dr. Rayann Heman 12/5 as scheduled  Geroge Baseman. Madsen Riddle, [facility] [address] [place], [place] [address] [phone]

## 2017-08-23 ENCOUNTER — Emergency Department (HOSPITAL_COMMUNITY): Payer: Medicare Other

## 2017-08-23 ENCOUNTER — Inpatient Hospital Stay (HOSPITAL_COMMUNITY)
Admission: EM | Admit: 2017-08-23 | Discharge: 2017-08-26 | DRG: 291 | Disposition: A | Discharge status: Home / Self Care | Payer: Medicare Other | Attending: Family Medicine | Admitting: Family Medicine

## 2017-08-23 ENCOUNTER — Ambulatory Visit: Payer: Self-pay

## 2017-08-23 ENCOUNTER — Other Ambulatory Visit: Payer: Self-pay

## 2017-08-23 ENCOUNTER — Encounter (HOSPITAL_COMMUNITY): Payer: Self-pay | Admitting: Emergency Medicine

## 2017-08-23 DIAGNOSIS — I5033 Acute on chronic diastolic (congestive) heart failure: Secondary | ICD-10-CM | POA: Diagnosis not present

## 2017-08-23 DIAGNOSIS — K8020 Calculus of gallbladder without cholecystitis without obstruction: Secondary | ICD-10-CM | POA: Diagnosis not present

## 2017-08-23 DIAGNOSIS — N183 Chronic kidney disease, stage 3 (moderate): Secondary | ICD-10-CM | POA: Diagnosis present

## 2017-08-23 DIAGNOSIS — R4189 Other symptoms and signs involving cognitive functions and awareness: Secondary | ICD-10-CM | POA: Diagnosis present

## 2017-08-23 DIAGNOSIS — I509 Heart failure, unspecified: Secondary | ICD-10-CM | POA: Diagnosis not present

## 2017-08-23 DIAGNOSIS — I482 Chronic atrial fibrillation: Secondary | ICD-10-CM

## 2017-08-23 DIAGNOSIS — I5043 Acute on chronic combined systolic (congestive) and diastolic (congestive) heart failure: Secondary | ICD-10-CM | POA: Diagnosis present

## 2017-08-23 DIAGNOSIS — R1011 Right upper quadrant pain: Secondary | ICD-10-CM | POA: Diagnosis not present

## 2017-08-23 DIAGNOSIS — M810 Age-related osteoporosis without current pathological fracture: Secondary | ICD-10-CM | POA: Diagnosis present

## 2017-08-23 DIAGNOSIS — G4733 Obstructive sleep apnea (adult) (pediatric): Secondary | ICD-10-CM | POA: Diagnosis present

## 2017-08-23 DIAGNOSIS — I4891 Unspecified atrial fibrillation: Secondary | ICD-10-CM | POA: Diagnosis present

## 2017-08-23 DIAGNOSIS — R0602 Shortness of breath: Secondary | ICD-10-CM | POA: Diagnosis not present

## 2017-08-23 DIAGNOSIS — K4090 Unilateral inguinal hernia, without obstruction or gangrene, not specified as recurrent: Secondary | ICD-10-CM | POA: Diagnosis present

## 2017-08-23 DIAGNOSIS — Z96659 Presence of unspecified artificial knee joint: Secondary | ICD-10-CM | POA: Diagnosis present

## 2017-08-23 DIAGNOSIS — Z882 Allergy status to sulfonamides status: Secondary | ICD-10-CM

## 2017-08-23 DIAGNOSIS — Z888 Allergy status to other drugs, medicaments and biological substances status: Secondary | ICD-10-CM

## 2017-08-23 DIAGNOSIS — R110 Nausea: Secondary | ICD-10-CM

## 2017-08-23 DIAGNOSIS — E875 Hyperkalemia: Secondary | ICD-10-CM | POA: Diagnosis present

## 2017-08-23 DIAGNOSIS — R609 Edema, unspecified: Secondary | ICD-10-CM | POA: Diagnosis not present

## 2017-08-23 DIAGNOSIS — Z7901 Long term (current) use of anticoagulants: Secondary | ICD-10-CM

## 2017-08-23 DIAGNOSIS — R109 Unspecified abdominal pain: Secondary | ICD-10-CM | POA: Diagnosis present

## 2017-08-23 DIAGNOSIS — E7849 Other hyperlipidemia: Secondary | ICD-10-CM | POA: Diagnosis not present

## 2017-08-23 DIAGNOSIS — I481 Persistent atrial fibrillation: Secondary | ICD-10-CM | POA: Diagnosis present

## 2017-08-23 DIAGNOSIS — E785 Hyperlipidemia, unspecified: Secondary | ICD-10-CM | POA: Diagnosis present

## 2017-08-23 DIAGNOSIS — K5730 Diverticulosis of large intestine without perforation or abscess without bleeding: Secondary | ICD-10-CM | POA: Diagnosis not present

## 2017-08-23 DIAGNOSIS — I130 Hypertensive heart and chronic kidney disease with heart failure and stage 1 through stage 4 chronic kidney disease, or unspecified chronic kidney disease: Principal | ICD-10-CM | POA: Diagnosis present

## 2017-08-23 DIAGNOSIS — R1012 Left upper quadrant pain: Secondary | ICD-10-CM | POA: Diagnosis not present

## 2017-08-23 DIAGNOSIS — R100 Acute abdomen: Secondary | ICD-10-CM | POA: Diagnosis not present

## 2017-08-23 DIAGNOSIS — Z79899 Other long term (current) drug therapy: Secondary | ICD-10-CM

## 2017-08-23 DIAGNOSIS — I10 Essential (primary) hypertension: Secondary | ICD-10-CM

## 2017-08-23 DIAGNOSIS — Z66 Do not resuscitate: Secondary | ICD-10-CM | POA: Diagnosis present

## 2017-08-23 LAB — CBC WITH DIFFERENTIAL/PLATELET
Basophils Absolute: 0 10*3/uL (ref 0.0–0.1)
Basophils Relative: 0 %
EOS ABS: 0.1 10*3/uL (ref 0.0–0.7)
Eosinophils Relative: 1 %
HEMATOCRIT: 42.7 % (ref 36.0–46.0)
HEMOGLOBIN: 14.4 g/dL (ref 12.0–15.0)
LYMPHS ABS: 0.9 10*3/uL (ref 0.7–4.0)
LYMPHS PCT: 16 %
MCH: 33 pg (ref 26.0–34.0)
MCHC: 33.7 g/dL (ref 30.0–36.0)
MCV: 97.7 fL (ref 78.0–100.0)
MONOS PCT: 13 %
Monocytes Absolute: 0.7 10*3/uL (ref 0.1–1.0)
NEUTROS ABS: 4 10*3/uL (ref 1.7–7.7)
NEUTROS PCT: 70 %
Platelets: 206 10*3/uL (ref 150–400)
RBC: 4.37 MIL/uL (ref 3.87–5.11)
RDW: 15.1 % (ref 11.5–15.5)
WBC: 5.7 10*3/uL (ref 4.0–10.5)

## 2017-08-23 LAB — COMPREHENSIVE METABOLIC PANEL
ALBUMIN: 4 g/dL (ref 3.5–5.0)
ALK PHOS: 122 U/L (ref 38–126)
ALT: 16 U/L (ref 14–54)
AST: 52 U/L — AB (ref 15–41)
Anion gap: 11 (ref 5–15)
BILIRUBIN TOTAL: 2.1 mg/dL — AB (ref 0.3–1.2)
BUN: 19 mg/dL (ref 6–20)
CALCIUM: 9.3 mg/dL (ref 8.9–10.3)
CO2: 23 mmol/L (ref 22–32)
Chloride: 99 mmol/L — ABNORMAL LOW (ref 101–111)
Creatinine, Ser: 1.17 mg/dL — ABNORMAL HIGH (ref 0.44–1.00)
GFR calc Af Amer: 49 mL/min — ABNORMAL LOW (ref >60)
GFR calc non Af Amer: 42 mL/min — ABNORMAL LOW (ref >60)
Glucose, Bld: 96 mg/dL (ref 65–99)
Potassium: 5.5 mmol/L — ABNORMAL HIGH (ref 3.5–5.1)
SODIUM: 133 mmol/L — AB (ref 135–145)
TOTAL PROTEIN: 6.8 g/dL (ref 6.5–8.1)

## 2017-08-23 LAB — POTASSIUM: Potassium: 3.9 mmol/L (ref 3.5–5.1)

## 2017-08-23 LAB — PROTIME-INR
INR: 2.04
Prothrombin Time: 22.9 seconds — ABNORMAL HIGH (ref 11.4–15.2)

## 2017-08-23 LAB — LIPASE, BLOOD: Lipase: 22 U/L (ref 11–51)

## 2017-08-23 LAB — BRAIN NATRIURETIC PEPTIDE: B Natriuretic Peptide: 136.9 pg/mL — ABNORMAL HIGH (ref 0.0–100.0)

## 2017-08-23 LAB — I-STAT TROPONIN, ED: Troponin i, poc: 0 ng/mL (ref 0.00–0.08)

## 2017-08-23 LAB — MRSA PCR SCREENING: MRSA by PCR: NEGATIVE

## 2017-08-23 MED ORDER — PRESERVISION AREDS PO CAPS: 1.0000 | ORAL_CAPSULE | Freq: Two times a day (BID) | ORAL | Status: DC

## 2017-08-23 MED ORDER — DILTIAZEM HCL ER COATED BEADS 240 MG PO CP24
240.0000 mg | ORAL_CAPSULE | Freq: Two times a day (BID) | ORAL | Status: DC
  Administered 2017-08-23 – 2017-08-24 (×2): 240 mg via ORAL
  Filled 2017-08-23 (×2): qty 1

## 2017-08-23 MED ORDER — MEMANTINE HCL 10 MG PO TABS
10.0000 mg | ORAL_TABLET | Freq: Two times a day (BID) | ORAL | Status: DC
  Administered 2017-08-23 – 2017-08-26 (×6): 10 mg via ORAL
  Filled 2017-08-23 (×6): qty 1

## 2017-08-23 MED ORDER — DOCUSATE SODIUM 100 MG PO CAPS
100.0000 mg | ORAL_CAPSULE | Freq: Two times a day (BID) | ORAL | Status: DC
  Administered 2017-08-23 – 2017-08-26 (×6): 100 mg via ORAL
  Filled 2017-08-23 (×6): qty 1

## 2017-08-23 MED ORDER — PROSIGHT PO TABS
1.0000 | ORAL_TABLET | Freq: Every day | ORAL | Status: DC
  Administered 2017-08-23 – 2017-08-25 (×3): 1 via ORAL
  Filled 2017-08-23 (×3): qty 1

## 2017-08-23 MED ORDER — ESCITALOPRAM OXALATE 10 MG PO TABS
10.0000 mg | ORAL_TABLET | Freq: Every day | ORAL | Status: DC
  Administered 2017-08-24 – 2017-08-26 (×3): 10 mg via ORAL
  Filled 2017-08-23 (×3): qty 1

## 2017-08-23 MED ORDER — ONDANSETRON HCL 4 MG/2ML IJ SOLN: 4.0000 mg | Freq: Four times a day (QID) | INTRAMUSCULAR | Status: DC | PRN

## 2017-08-23 MED ORDER — SPIRONOLACTONE 25 MG PO TABS
25.0000 mg | ORAL_TABLET | Freq: Every day | ORAL | Status: DC
  Administered 2017-08-24 – 2017-08-25 (×2): 25 mg via ORAL
  Filled 2017-08-23 (×3): qty 1

## 2017-08-23 MED ORDER — MELATONIN 3 MG PO TABS
6.0000 mg | ORAL_TABLET | Freq: Every day | ORAL | Status: DC
  Administered 2017-08-23 – 2017-08-25 (×3): 6 mg via ORAL
  Filled 2017-08-23 (×4): qty 2

## 2017-08-23 MED ORDER — ROSUVASTATIN CALCIUM 40 MG PO TABS
40.0000 mg | ORAL_TABLET | Freq: Every day | ORAL | Status: DC
  Administered 2017-08-23 – 2017-08-25 (×3): 40 mg via ORAL
  Filled 2017-08-23 (×3): qty 1

## 2017-08-23 MED ORDER — DONEPEZIL HCL 10 MG PO TABS
10.0000 mg | ORAL_TABLET | Freq: Every day | ORAL | Status: DC
  Administered 2017-08-23 – 2017-08-25 (×3): 10 mg via ORAL
  Filled 2017-08-23 (×3): qty 1

## 2017-08-23 MED ORDER — FUROSEMIDE 10 MG/ML IJ SOLN
60.0000 mg | Freq: Two times a day (BID) | INTRAMUSCULAR | Status: DC
  Administered 2017-08-24: 60 mg via INTRAVENOUS
  Filled 2017-08-23: qty 6

## 2017-08-23 MED ORDER — FUROSEMIDE 10 MG/ML IJ SOLN
40.0000 mg | Freq: Once | INTRAMUSCULAR | Status: AC
  Administered 2017-08-23: 40 mg via INTRAVENOUS

## 2017-08-23 MED ORDER — KETOROLAC TROMETHAMINE 15 MG/ML IJ SOLN
15.0000 mg | Freq: Four times a day (QID) | INTRAMUSCULAR | Status: DC | PRN
  Administered 2017-08-24 – 2017-08-26 (×4): 15 mg via INTRAVENOUS
  Filled 2017-08-23 (×5): qty 1

## 2017-08-23 MED ORDER — WARFARIN SODIUM 2.5 MG PO TABS
2.5000 mg | ORAL_TABLET | Freq: Once | ORAL | Status: AC
  Administered 2017-08-23: 2.5 mg via ORAL
  Filled 2017-08-23: qty 1

## 2017-08-23 MED ORDER — ACETAMINOPHEN 650 MG RE SUPP: 650.0000 mg | Freq: Four times a day (QID) | RECTAL | Status: DC | PRN

## 2017-08-23 MED ORDER — GABAPENTIN 100 MG PO CAPS
100.0000 mg | ORAL_CAPSULE | Freq: Every day | ORAL | Status: DC
  Administered 2017-08-23 – 2017-08-25 (×3): 100 mg via ORAL
  Filled 2017-08-23 (×3): qty 1

## 2017-08-23 MED ORDER — WARFARIN - PHARMACIST DOSING INPATIENT
Freq: Every day | Status: DC
  Administered 2017-08-24 – 2017-08-25 (×2)

## 2017-08-23 MED ORDER — BISACODYL 10 MG RE SUPP: 10.0000 mg | Freq: Every day | RECTAL | Status: DC | PRN

## 2017-08-23 MED ORDER — FUROSEMIDE 10 MG/ML IJ SOLN
40.0000 mg | Freq: Once | INTRAMUSCULAR | Status: AC
  Administered 2017-08-23: 40 mg via INTRAVENOUS
  Filled 2017-08-23: qty 4

## 2017-08-23 MED ORDER — ROPINIROLE HCL 1 MG PO TABS
1.0000 mg | ORAL_TABLET | Freq: Two times a day (BID) | ORAL | Status: DC
  Administered 2017-08-23 – 2017-08-25 (×5): 1 mg via ORAL
  Filled 2017-08-23 (×7): qty 1

## 2017-08-23 MED ORDER — POLYETHYLENE GLYCOL 3350 17 G PO PACK
17.0000 g | PACK | Freq: Every day | ORAL | Status: DC | PRN
  Administered 2017-08-26: 17 g via ORAL
  Filled 2017-08-23: qty 1

## 2017-08-23 MED ORDER — ONDANSETRON HCL 4 MG PO TABS
4.0000 mg | ORAL_TABLET | Freq: Four times a day (QID) | ORAL | Status: DC | PRN
  Administered 2017-08-25: 4 mg via ORAL
  Filled 2017-08-23: qty 1

## 2017-08-23 MED ORDER — IOPAMIDOL (ISOVUE-300) INJECTION 61%
INTRAVENOUS | Status: AC
Start: 2017-08-23 — End: 2017-08-23
  Administered 2017-08-23: 14:00:00
  Filled 2017-08-23: qty 100

## 2017-08-23 MED ORDER — OXYBUTYNIN CHLORIDE ER 10 MG PO TB24
10.0000 mg | ORAL_TABLET | Freq: Every day | ORAL | Status: DC
  Administered 2017-08-23 – 2017-08-26 (×4): 10 mg via ORAL
  Filled 2017-08-23 (×5): qty 1

## 2017-08-23 MED ORDER — FENTANYL CITRATE (PF) 100 MCG/2ML IJ SOLN
50.0000 ug | Freq: Once | INTRAMUSCULAR | Status: AC
  Administered 2017-08-23: 50 ug via INTRAVENOUS
  Filled 2017-08-23: qty 2

## 2017-08-23 MED ORDER — HYDROCODONE-ACETAMINOPHEN 7.5-325 MG PO TABS
1.0000 | ORAL_TABLET | Freq: Four times a day (QID) | ORAL | Status: DC | PRN
  Administered 2017-08-23 – 2017-08-26 (×8): 1 via ORAL
  Filled 2017-08-23 (×8): qty 1

## 2017-08-23 MED ORDER — ONDANSETRON HCL 4 MG/2ML IJ SOLN
4.0000 mg | INTRAMUSCULAR | Status: AC
  Administered 2017-08-23: 4 mg via INTRAVENOUS
  Filled 2017-08-23: qty 2

## 2017-08-23 MED ORDER — DIAZEPAM 2 MG PO TABS: 2.0000 mg | ORAL_TABLET | Freq: Every evening | ORAL | Status: DC | PRN

## 2017-08-23 MED ORDER — NITROGLYCERIN 0.4 MG SL SUBL: 0.4000 mg | SUBLINGUAL_TABLET | SUBLINGUAL | Status: DC | PRN

## 2017-08-23 MED ORDER — ACETAMINOPHEN 325 MG PO TABS: 650.0000 mg | ORAL_TABLET | Freq: Four times a day (QID) | ORAL | Status: DC | PRN

## 2017-08-23 NOTE — ED Notes (Signed)
Pt Sat decreased and pt is sleepy after medication. O2 at 2 l /[place] applied andf Harris PA aware.

## 2017-08-23 NOTE — ED Notes (Signed)
Got patient on the monitor took her vitals they were in normal limits patient is resting with call bell in reach

## 2017-08-23 NOTE — Addendum Note (Signed)
Encounter addended by: Sherran Needs, NP on: 08/23/2017 8:52 AM  Actions taken: Sign clinical note

## 2017-08-23 NOTE — Telephone Encounter (Signed)
Rec'd call from pt's daughter. Reported the pt. Saw Dr. Elease Hashimoto last week for abdominal pain.  Stated that her abdominal pain has worsened.  Reported she is c/o bilateral pain on her sides and radiates to her breast area, and into the front of her abdomen.  Stated the nurse at [facility] reported that there is a lump on left side of abdomen, and in comparing left and right abdomen, it is visible.  Reported the pt. Was seen at the A-Fib clinic yesterday, and had an 11# weight gain.  Reported the CXR didn't show any changes compared to previous one.  Was given increased dose of Lasix over 3 days for the edema.  Denied any fever/ chills.  Daughter stated the pt. Is always Short of breath, and doesn't feel it is worse at present.  Asking about admitting pt. To hospital for further work-up.  Questioned if Dr. Elease Hashimoto will admit her.  Phone call to [facility], Animal nutritionist.  Advised of symptoms reported by the daughter.  Questioned if pt. Should go to the ER, or if Dr. Elease Hashimoto would directly admit her, per daughter request.  Per Sunday Spillers at [facility], she has already rec'd this message, and will be speaking to Dr. Elease Hashimoto, and will call the daughter back.   Called daughter and advised of the above.  Verb. Understanding.

## 2017-08-23 NOTE — Telephone Encounter (Signed)
Daughter [name] is calling.  Patient went to cardiology yesterday and she has had a weight gain of 11 pounds.  Today patient is complaining of more abdominal pain.   [name] would like to take her to the hospital and would like to know if you would do a direct admit? [name] number is [phone].

## 2017-08-23 NOTE — Telephone Encounter (Signed)
Angel Meyer is aware and will take her mom to [facility] hospital ED. Charge nurse called.

## 2017-08-23 NOTE — H&P (Signed)
History and Physical    Angel Meyer YDX:[id number] DOB: 08/01/1933 DOA: 08/23/2017  PCP: [name], MD   Patient coming from: Nursing home  I have personally briefly reviewed patient's old medical records in [place]  Chief Complaint: Abdominal pain  HPI: Angel Meyer is a 83 y.o. female with medical history significant of chronic atrial fibrillation on Coumadin, CHF with exacerbation, chronic back pain, hypertension, hyperlipidemia, obstructive sleep apnea, osteoarthritis, urinary incontinence, restless leg syndrome, renal stones and cognitive impairment presented with complaints of abdominal pain.  Patient states that she has been having left-sided abdominal pain for the last week, progressively getting worse, sharp, up to 7-8 out of 10 in intensity associated with some right flank pain and nausea but no vomiting or diarrhea.  The pain has gotten worse despite taking pain medications.  Patient also complains of worsening abdominal swelling along with swelling of her legs with some shortness of breath.  Patient was seen in A. fib clinic yesterday and was asked to take an extra dose of Lasix.  She did not notice much increase in her urination.  No fever, chest pain, dysuria, hematuria, loss of consciousness or seizures.  ED Course: Patient had CT scan of the abdomen which showed gallstones and right inguinal hernia without incarceration, with diffuse diverticulosis without diverticulitis.  She was given intravenous Lasix.  General surgery was called by the ED provider.  Hospitalist service was called to evaluate the patient  Review of Systems: As per HPI otherwise 10 point review of systems negative.   Past Medical History:  Diagnosis Date  . Arthritis   . Diastolic dysfunction    preserved EF,  CHF In setting of afib previously  . History of blood transfusion   . Hyperlipidemia   . Hypertension   . Incontinence of urine   . OSA (obstructive sleep apnea)    does not use  CPAP  . Osteoporosis   . Persistent atrial fibrillation (HCC)    failed on Flecainide, Tikosyn, and amiodarone  . Renal calculi   . Restless legs   . Rheumatic fever    age 41  . Systolic heart failure (HCC)    EF 30 to 35% per echo 08/2013,  subsequently has normalized    Past Surgical History:  Procedure Laterality Date  . ABDOMINAL HYSTERECTOMY    . BREAST SURGERY     benign biopsy  . CARDIOVERSION N/A 09/10/2013   Procedure: CARDIOVERSION;  Surgeon: Thompson Grayer, MD;  Location: [place];  Service: Cardiovascular;  Laterality: N/A;  . CARDIOVERSION N/A 11/16/2013   Procedure: CARDIOVERSION;  Surgeon: Sueanne Margarita, MD;  Location: [facility] ENDOSCOPY;  Service: Cardiovascular;  Laterality: N/A;  . CARDIOVERSION N/A 08/18/2015   Procedure: CARDIOVERSION;  Surgeon: Sueanne Margarita, MD;  Location: [facility] ENDOSCOPY;  Service: Cardiovascular;  Laterality: N/A;  . CARDIOVERSION N/A 03/07/2017   Procedure: CARDIOVERSION;  Surgeon: Dorothy Spark, MD;  Location: [facility] ENDOSCOPY;  Service: Cardiovascular;  Laterality: N/A;  . CARDIOVERSION N/A 05/13/2017   Procedure: CARDIOVERSION;  Surgeon: Lelon Perla, MD;  Location: [facility] ENDOSCOPY;  Service: Cardiovascular;  Laterality: N/A;  . IR RADIOLOGIST EVAL & MGMT  02/04/2017  . IR VERTEBROPLASTY LUMBAR BX INC UNI/BIL INC/INJECT/IMAGING  02/10/2017  . [place] removed  . REPLACEMENT TOTAL KNEE  2005  . SPINE SURGERY     laminectomy 1965, spinal fusion with rod 2005  . TEE WITHOUT CARDIOVERSION N/A 03/07/2017   Procedure: TRANSESOPHAGEAL ECHOCARDIOGRAM (  TEE);  Surgeon: Dorothy Spark, MD;  Location: [place];  Service: Cardiovascular;  Laterality: N/A;  . TEE WITHOUT CARDIOVERSION N/A 05/13/2017   Procedure: TRANSESOPHAGEAL ECHOCARDIOGRAM (TEE);  Surgeon: Lelon Perla, MD;  Location: [facility] ENDOSCOPY;  Service: Cardiovascular;  Laterality: N/A;  . TONSILLECTOMY     Social history  reports that  has never smoked. she has never  used smokeless tobacco. She reports that she does not drink alcohol or use drugs.  Currently lives in a nursing home  Allergies  Allergen Reactions  . Ciprofloxacin Nausea And Vomiting  . Demerol [Meperidine] Nausea And Vomiting  . Sulfa Antibiotics Nausea And Vomiting    And all derivatives -  GI Bleeding  . Sulfacetamide Sodium Nausea And Vomiting    Family History  Problem Relation Age of Onset  . Cancer Mother        breast  . Diabetes Mother   . Cancer Paternal Grandfather     Prior to Admission medications   Medication Sig Start Date End Date Taking? Authorizing Provider  acetaminophen (TYLENOL) 500 MG tablet Take 500 mg 2 (two) times daily by mouth.    [provider]  diazepam (VALIUM) 2 MG tablet Take 1 tablet (2 mg total) at bedtime as needed by mouth for muscle spasms. 08/13/17   Deno Etienne, DO  diltiazem (CARDIZEM CD) 360 MG 24 hr capsule Take 1 capsule (360 mg total) by mouth daily. 08/22/17   Sherran Needs, NP  diltiazem (CARDIZEM) 30 MG tablet Take 30 mg every 6 (six) hours as needed by mouth (A-FIB). For Blood pressure     [provider]  docusate sodium (COLACE) 100 MG capsule Take 100 mg 2 (two) times daily by mouth.    [provider]  donepezil (ARICEPT ODT) 10 MG disintegrating tablet Take 1 tablet (10 mg total) by mouth at bedtime. 06/29/16   Burchette, Alinda Sierras, MD  escitalopram (LEXAPRO) 10 MG tablet TAKE 1 TABLET EVERY DAY 03/08/17   Burchette, Alinda Sierras, MD  furosemide (LASIX) 40 MG tablet Take 1 tablet (40 mg total) by mouth daily. 12/07/16   Burtis Junes, NP  gabapentin (NEURONTIN) 100 MG capsule Take 100 mg by mouth at bedtime.    [provider]  HYDROcodone-acetaminophen (NORCO) 7.5-325 MG tablet Take 1 tablet every 6 (six) hours as needed by mouth for moderate pain. Patient taking differently: Take 1 tablet by mouth at bedtime as needed for moderate pain or severe pain (8am 2 pm and 80 pm with extra dose as needed  at bedtime).  08/03/17   Burchette, Alinda Sierras, MD  magnesium gluconate (MAGONATE) 500 MG tablet Take 500 mg at bedtime by mouth.    [provider]  Melatonin 5 MG TABS Take 5 mg at bedtime by mouth.     [provider]  memantine (NAMENDA) 10 MG tablet Take 10 mg 2 (two) times daily by mouth.    [provider]  Misc. Devices (ROLLER [place]) MISC Used as Directed. 01/12/16   Burchette, Alinda Sierras, MD  Multiple Vitamins-Minerals (PRESERVISION AREDS) CAPS Take 1 capsule by mouth 2 (two) times daily.    [provider]  nitroGLYCERIN (NITROSTAT) 0.4 MG SL tablet Place 1 tablet (0.4 mg total) under the tongue every 5 (five) minutes as needed for chest pain. 12/07/16   Burtis Junes, NP  oxybutynin (DITROPAN-XL) 10 MG 24 hr tablet Take 10 mg by mouth daily.  05/09/15   [provider]  rOPINIRole (  REQUIP) 1 MG tablet Take 1 tablet (1 mg total) by mouth See admin instructions. Take 1 tablet (1 mg) by mouth daily at 4pm and at bedtime Patient taking differently: Take 1 mg 2 (two) times daily by mouth. Take 1 tablet (1 mg) by mouth daily at 4pm and at bedtime 8 pm 06/29/16   Burchette, Alinda Sierras, MD  rosuvastatin (CRESTOR) 40 MG tablet Take 1 tablet (40 mg total) by mouth at bedtime. 12/07/16   Burtis Junes, NP  spironolactone (ALDACTONE) 25 MG tablet Take 1 tablet (25 mg total) by mouth daily. 12/07/16   Burtis Junes, NP  trimethoprim (TRIMPEX) 100 MG tablet Take 100 mg by mouth daily.  05/18/15   [provider]  trolamine salicylate (ASPERCREME) 10 % cream Apply 1 application every 12 (twelve) hours as needed topically for muscle pain.    [provider]  warfarin (COUMADIN) 2.5 MG tablet Take as directed by anticoagulation clinic. Patient taking differently: Take 2.5 mg by mouth every evening. Take as directed by anticoagulation clinic. 01/25/17   [name], MD    Physical Exam: Vitals:   08/23/17 1545 08/23/17 1600 08/23/17 1615  08/23/17 1645  BP:  122/82 116/70 112/71  Pulse: (!) 108 (!) 106 (!) 104 (!) 105  Resp: 17 18 (!) 23 (!) 22  Temp:      TempSrc:      SpO2: 92% 91% 96% 95%  Weight:      Height:        Constitutional: NAD, calm, comfortable.  Elderly female lying in bed Vitals:   08/23/17 1545 08/23/17 1600 08/23/17 1615 08/23/17 1645  BP:  122/82 116/70 112/71  Pulse: (!) 108 (!) 106 (!) 104 (!) 105  Resp: 17 18 (!) 23 (!) 22  Temp:      TempSrc:      SpO2: 92% 91% 96% 95%  Weight:      Height:       Eyes: PERRL, lids and conjunctivae normal ENMT: Mucous membranes are moist. Posterior pharynx clear of any exudate or lesions.  Neck: normal, supple, no masses, no thyromegaly Respiratory: Bilateral decreased breath sounds at bases with basilar crackles.  No wheezing.  Mild tachypnea intermittently cardiovascular: Rate controlled, S1-S2 positive; bilateral lower extremity 1-2+ edema abdomen: Mild left upper quadrant tenderness, no rebound tenderness, no hepatosplenomegaly. Bowel sounds positive.  Musculoskeletal: no clubbing / cyanosis. No joint deformity upper and lower extremities.   Skin: no rashes, lesions, ulcers. No induration; mild bilateral lower extremity erythema Neurologic: CN 2-12 grossly intact.  Moving extremities.  No obvious focal neurological deficit Psychiatric: Normal judgment and insight. Alert and oriented x 3. Normal mood.    Labs on Admission: I have personally reviewed following labs and imaging studies  CBC: Recent Labs  Lab 08/23/17 1350  WBC 5.7  NEUTROABS 4.0  HGB 14.4  HCT 42.7  MCV 97.7  PLT 841   Basic Metabolic Panel: Recent Labs  Lab 08/23/17 1350 08/23/17 1604  NA 133*  --   K 5.5* 3.9  CL 99*  --   CO2 23  --   GLUCOSE 96  --   BUN 19  --   CREATININE 1.17*  --   CALCIUM 9.3  --    GFR: Estimated Creatinine Clearance: 39.4 mL/min (A) (by C-G formula based on SCr of 1.17 mg/dL (H)). Liver Function Tests: Recent Labs  Lab 08/23/17 1350    AST 52*  ALT 16  ALKPHOS 122  BILITOT  2.1*  PROT 6.8  ALBUMIN 4.0   Recent Labs  Lab 08/23/17 1350  LIPASE 22   No results for input(s): AMMONIA in the last 168 hours. Coagulation Profile: Recent Labs  Lab 08/23/17 1350  INR 2.04   Cardiac Enzymes: No results for input(s): CKTOTAL, CKMB, CKMBINDEX, TROPONINI in the last 168 hours. BNP (last 3 results) No results for input(s): PROBNP in the last 8760 hours. HbA1C: No results for input(s): HGBA1C in the last 72 hours. CBG: No results for input(s): GLUCAP in the last 168 hours. Lipid Profile: No results for input(s): CHOL, HDL, LDLCALC, TRIG, CHOLHDL, LDLDIRECT in the last 72 hours. Thyroid Function Tests: No results for input(s): TSH, T4TOTAL, FREET4, T3FREE, THYROIDAB in the last 72 hours. Anemia Panel: No results for input(s): VITAMINB12, FOLATE, FERRITIN, TIBC, IRON, RETICCTPCT in the last 72 hours. Urine analysis:    Component Value Date/Time   COLORURINE YELLOW 08/13/2017 0400   APPEARANCEUR HAZY (A) 08/13/2017 0400   LABSPEC 1.043 (H) 08/13/2017 0400   PHURINE 6.0 08/13/2017 0400   GLUCOSEU NEGATIVE 08/13/2017 0400   HGBUR NEGATIVE 08/13/2017 0400   BILIRUBINUR NEGATIVE 08/13/2017 0400   BILIRUBINUR neg 08/03/2017 1153   KETONESUR 5 (A) 08/13/2017 0400   PROTEINUR NEGATIVE 08/13/2017 0400   UROBILINOGEN 0.2 08/03/2017 1153   NITRITE NEGATIVE 08/13/2017 0400   LEUKOCYTESUR NEGATIVE 08/13/2017 0400    Radiological Exams on Admission: Dg Chest 2 View  Result Date: 08/22/2017 CLINICAL DATA:  Dyspnea EXAM: CHEST  2 VIEW COMPARISON:  08/13/2017 FINDINGS: Chronic lung disease with prominent lung markings. Left lower lobe airspace disease unchanged. Negative for heart failure or pneumonia. No significant pleural effusion. Mild cardiac enlargement Right shoulder replacement.  Lumbar fusion hardware. IMPRESSION: Chronic lung disease with scarring and left lower lobe atelectasis, stable. No interval change.  Electronically Signed   By: Franchot Gallo M.D.   On: 08/22/2017 15:06   Ct Abdomen Pelvis W Contrast  Result Date: 08/23/2017 CLINICAL DATA:  Left-sided abdominal pain and bilateral lower extremity swelling. EXAM: CT ABDOMEN AND PELVIS WITH CONTRAST TECHNIQUE: Multidetector CT imaging of the abdomen and pelvis was performed using the standard protocol following bolus administration of intravenous contrast. CONTRAST:  <See Chart> ISOVUE-300 IOPAMIDOL (ISOVUE-300) INJECTION 61% COMPARISON:  None. FINDINGS: Lower chest: Stable area of rounded atelectasis at the left lung base when compared to prior chest CT scan from 11/17 218. The heart is mildly enlarged and coronary artery calcifications are noted. Hepatobiliary: No focal hepatic lesions or intrahepatic biliary dilatation. Gallbladder is mildly distended. A small calcified gallstone is noted. No CT findings to suggest acute cholecystitis. Pancreas: No mass, inflammation or ductal dilatation. Spleen: Normal size.  Small calcified granulomas. Adrenals/Urinary Tract: The adrenal glands are unremarkable. No renal lesions or hydronephrosis. The delayed images do not demonstrate any significant collecting system abnormality cyst. No worrisome renal lesions. Stomach/Bowel: The stomach, duodenum, small bowel and colon are grossly normal. No acute inflammatory changes, mass lesions or obstructive findings. Diffuse colonic diverticulosis without definite findings for acute diverticulitis. The terminal ileum is normal. Vascular/Lymphatic: Advanced atherosclerotic calcifications involving aorta and iliac arteries. No aneurysm or dissection. The branch vessels are patent. The major venous structures are patent. The circumaortic left renal vein is noted. No mesenteric or retroperitoneal mass or adenopathy. Reproductive: Surgically absent. Other: There is a right inguinal hernia containing the cecum just distal to the ileocecal valve. No findings for obstruction or  incarceration but could be a source of pain. Musculoskeletal: No significant bony findings. There  are vertebral augmentation changes noted at L3 and there is spinal stabilization hardware at L1-2. Remote appearing L1 fracture. IMPRESSION: 1. Cholelithiasis without CT findings for acute cholecystitis. 2. Right inguinal hernia containing the cecum. No obvious obstruction/incarceration. 3. Diffuse colonic diverticulosis without findings for acute diverticulitis. 4. Advanced vascular disease. Electronically Signed   By: Marijo Sanes M.D.   On: 08/23/2017 16:06   Dg Chest Port 1 View  Result Date: 08/23/2017 CLINICAL DATA:  Bibasilar crackers. Exertional dyspnea. Chronic shortness of breath. EXAM: PORTABLE CHEST 1 VIEW COMPARISON:  08/22/2017 FINDINGS: The cardiac silhouette is enlarged. Mediastinal contours appear intact. There is no evidence of focal airspace consolidation, pleural effusion or pneumothorax. Chronic linear peribronchial densities in the left lung base. Osseous structures are without acute abnormality. Soft tissues are grossly normal. IMPRESSION: Persistent linear peribronchial airspace opacities in the left lung base. This may represent atelectasis, scarring or peribronchial airspace consolidation. Electronically Signed   By: Fidela Salisbury M.D.   On: 08/23/2017 13:31   US Abdomen Limited Ruq  Result Date: 08/23/2017 CLINICAL DATA:  83 year old with right upper quadrant pain. EXAM: ULTRASOUND ABDOMEN LIMITED RIGHT UPPER QUADRANT COMPARISON:  08/23/2017 FINDINGS: Gallbladder: Mild distention of the gallbladder. Gallbladder wall measures up to 0.2 cm. Echogenic foci in the gallbladder are suggestive for stones. Reportedly, the patient does have a sonographic Murphy sign. No evidence for pericholecystic fluid. Common bile duct: Diameter: 0.3 cm. Liver: Liver is not well visualized on this examination. Liver is mildly heterogeneous. No significant biliary dilatation. Portal vein is patent on  color Doppler imaging with normal direction of blood flow towards the liver. IMPRESSION: Cholelithiasis with mild gallbladder distension and a Murphy sign. Findings are concerning for acute cholecystitis. No biliary dilatation. Electronically Signed   By: Markus Daft M.D.   On: 08/23/2017 17:50      Assessment/Plan Active Problems:   Abdominal pain   Hypertension   Hyperlipidemia   Atrial fibrillation (HCC)   Cholelithiasis   Acute on chronic diastolic CHF (congestive heart failure) (HCC)   CHF exacerbation (HCC)   Abdominal pain with cholelithiasis -Questionable cause -CT abdomen shows cholelithiasis without cholecystitis along with diffuse diverticulosis and right inguinal hernia.  General surgery has been paged by the ER provider.  Will follow up with recommendations -Continue pain management  Acute on chronic diastolic congestive heart failure -Echo on 05/13/2017 had shown EF of 55-60% with mild to moderate mitral regurgitation -40 mg IV Lasix was given in the ED.  We will give 1 more dose of Lasix 40 mg IV now.  Continue Lasix 16 g IV every 12 hours.  Strict input and output.  Daily weights.  Cardiology evaluation.  Continue spironolactone -Monitor creatinine -Bilateral lower extremity duplex to rule out DVT  Chronic atrial fibrillation on anticoagulation -Currently rate controlled.  Continue Cardizem and Coumadin will be dosed as per pharmacy.  Monitor INR  Hypertension -Monitor blood pressure.  Continue Cardizem and spironolactone and Lasix  Dyslipidemia -Continue statin  Cognitive impairment -Continue memantine, Lexapro and donepezil  Mild hyperkalemia -Questionable cause.  Repeat a.m. Labs  Chronic kidney disease stage III -Monitor creatinine.  Repeat a.m. labs  DVT prophylaxis: Coumadin Code Status: DNR Family Communication: Discussed with daughter at bedside Disposition Plan: Probably back to nursing home in 1-2 days Consults called: General surgery notified  by ER provider.  Cardiology consulted via email Admission status: Observation in telemetry  Severity of Illness: The appropriate patient status for this patient is OBSERVATION. Observation status is judged to  be reasonable and necessary in order to provide the required intensity of service to ensure the patient's safety. The patient's presenting symptoms, physical exam findings, and initial radiographic and laboratory data in the context of their medical condition is felt to place them at decreased risk for further clinical deterioration. Furthermore, it is anticipated that the patient will be medically stable for discharge from the hospital within 2 midnights of admission. The following factors support the patient status of observation.   " The patient's presenting symptoms include abdominal pain/leg swelling " The physical exam findings include abdominal tenderness and pedal edema " The initial radiographic and laboratory data are cholelithiasis.      Aline August MD [facility] Pager [phone]  If 7PM-7AM, please contact night-coverage [email or web address] Password [facility]  08/23/2017, 6:11 PM

## 2017-08-23 NOTE — Telephone Encounter (Signed)
Will need to go to ER to get further work up before they will admit.

## 2017-08-23 NOTE — ED Provider Notes (Signed)
[place] [facility] EMERGENCY DEPARTMENT Provider Note   CSN: [id number] Arrival date & time: 08/23/17  1115     History   Chief Complaint Chief Complaint  Patient presents with  . Abdominal Pain  . Leg Swelling    HPI Angel Meyer is a 83 y.o. female who presents the emergency department chief complaint of swelling and abdominal pain.  She has a history of chronic atrial fibrillation on Coumadin, history of previous CHF exacerbation, .  She is chronic back pain and usually has pain on the right side radiating around the flank.  This morning the patient began complaining of severe abdominal pain.  Over the past week she has had worsening abdominal distention, swelling, swelling into her legs with significant peripheral edema.  She is never had that before.  She endorses exertional dyspnea and orthopnea.  She was seen at the A. fib clinic but had a clear chest x-ray and was told to take an extra dose of Lasix last night.  She states that she only made about 3 urinations which is abnormally low for her.  She denies chest pain or shortness of breath at rest she denies hemoptysis.  She has associated nausea.  HPI  Past Medical History:  Diagnosis Date  . Arthritis   . Diastolic dysfunction    preserved EF,  CHF In setting of afib previously  . History of blood transfusion   . Hyperlipidemia   . Hypertension   . Incontinence of urine   . OSA (obstructive sleep apnea)    does not use CPAP  . Osteoporosis   . Persistent atrial fibrillation (HCC)    failed on Flecainide, Tikosyn, and amiodarone  . Renal calculi   . Restless legs   . Rheumatic fever    age 70  . Systolic heart failure (HCC)    EF 30 to 35% per echo 08/2013,  subsequently has normalized    Patient Active Problem List   Diagnosis Date Noted  . Long term (current) use of anticoagulants 06/08/2017  . Lichen simplex chronicus 04/29/2017  . Risk for falls 12/17/2015  . Urinary urgency 10/29/2014  .  Cognitive impairment 07/18/2014  . Encounter for therapeutic drug monitoring 10/18/2013  . Lung nodule 10/03/2013  . CHF (congestive heart failure) ([place]) 09/08/2013  . Cough 08/07/2013  . Low blood pressure reading 08/07/2013  . Cerumen impaction 08/07/2013  . Anticoagulant long-term use 07/27/2013  . Acute upper respiratory infections of unspecified site 07/27/2013  . Wheezy bronchitis 07/27/2013  . OSA (obstructive sleep apnea) 12/20/2011  . Hypertension 12/20/2011  . Hyperlipidemia 12/20/2011  . Osteoarthritis 12/20/2011  . History of kidney stones 12/20/2011  . Osteoporosis 12/20/2011  . Urinary incontinence, urge 12/20/2011  . Restless leg syndrome 12/20/2011  . Atrial fibrillation ([place]) 12/20/2011    Past Surgical History:  Procedure Laterality Date  . ABDOMINAL HYSTERECTOMY    . BREAST SURGERY     benign biopsy  . CARDIOVERSION N/A 09/10/2013   Procedure: CARDIOVERSION;  Surgeon: Thompson Grayer, MD;  Location: [facility];  Service: Cardiovascular;  Laterality: N/A;  . CARDIOVERSION N/A 11/16/2013   Procedure: CARDIOVERSION;  Surgeon: Sueanne Margarita, MD;  Location: [facility] ENDOSCOPY;  Service: Cardiovascular;  Laterality: N/A;  . CARDIOVERSION N/A 08/18/2015   Procedure: CARDIOVERSION;  Surgeon: Sueanne Margarita, MD;  Location: [facility] ENDOSCOPY;  Service: Cardiovascular;  Laterality: N/A;  . CARDIOVERSION N/A 03/07/2017   Procedure: CARDIOVERSION;  Surgeon: Dorothy Spark, MD;  Location: [place];  Service: Cardiovascular;  Laterality: N/A;  . CARDIOVERSION N/A 05/13/2017   Procedure: CARDIOVERSION;  Surgeon: Lelon Perla, MD;  Location: [facility] ENDOSCOPY;  Service: Cardiovascular;  Laterality: N/A;  . IR RADIOLOGIST EVAL & MGMT  02/04/2017  . IR VERTEBROPLASTY LUMBAR BX INC UNI/BIL INC/INJECT/IMAGING  02/10/2017  . [place] removed  . REPLACEMENT TOTAL KNEE  2005  . SPINE SURGERY     laminectomy 1965, spinal fusion with rod 2005  . TEE WITHOUT CARDIOVERSION N/A  03/07/2017   Procedure: TRANSESOPHAGEAL ECHOCARDIOGRAM (TEE);  Surgeon: Dorothy Spark, MD;  Location: [facility] ENDOSCOPY;  Service: Cardiovascular;  Laterality: N/A;  . TEE WITHOUT CARDIOVERSION N/A 05/13/2017   Procedure: TRANSESOPHAGEAL ECHOCARDIOGRAM (TEE);  Surgeon: Lelon Perla, MD;  Location: [facility] ENDOSCOPY;  Service: Cardiovascular;  Laterality: N/A;  . TONSILLECTOMY      OB History    No data available       Home Medications    Prior to Admission medications   Medication Sig Start Date End Date Taking? Authorizing Provider  acetaminophen (TYLENOL) 500 MG tablet Take 500 mg 2 (two) times daily by mouth.    [provider]  diazepam (VALIUM) 2 MG tablet Take 1 tablet (2 mg total) at bedtime as needed by mouth for muscle spasms. 08/13/17   Deno Etienne, DO  diltiazem (CARDIZEM CD) 360 MG 24 hr capsule Take 1 capsule (360 mg total) by mouth daily. 08/22/17   Sherran Needs, NP  diltiazem (CARDIZEM) 30 MG tablet Take 30 mg every 6 (six) hours as needed by mouth (A-FIB). For Blood pressure     [provider]  docusate sodium (COLACE) 100 MG capsule Take 100 mg 2 (two) times daily by mouth.    [provider]  donepezil (ARICEPT ODT) 10 MG disintegrating tablet Take 1 tablet (10 mg total) by mouth at bedtime. 06/29/16   Burchette, Alinda Sierras, MD  escitalopram (LEXAPRO) 10 MG tablet TAKE 1 TABLET EVERY DAY 03/08/17   Burchette, Alinda Sierras, MD  furosemide (LASIX) 40 MG tablet Take 1 tablet (40 mg total) by mouth daily. 12/07/16   Burtis Junes, NP  gabapentin (NEURONTIN) 100 MG capsule Take 100 mg by mouth at bedtime.    [provider]  HYDROcodone-acetaminophen (NORCO) 7.5-325 MG tablet Take 1 tablet every 6 (six) hours as needed by mouth for moderate pain. Patient taking differently: Take 1 tablet by mouth at bedtime as needed for moderate pain or severe pain (8am 2 pm and 80 pm with extra dose as needed at bedtime).  08/03/17   Burchette, Alinda Sierras, MD    magnesium gluconate (MAGONATE) 500 MG tablet Take 500 mg at bedtime by mouth.    [provider]  Melatonin 5 MG TABS Take 5 mg at bedtime by mouth.     [provider]  memantine (NAMENDA) 10 MG tablet Take 10 mg 2 (two) times daily by mouth.    [provider]  Misc. Devices (ROLLER [place]) MISC Used as Directed. 01/12/16   Burchette, Alinda Sierras, MD  Multiple Vitamins-Minerals (PRESERVISION AREDS) CAPS Take 1 capsule by mouth 2 (two) times daily.    [provider]  nitroGLYCERIN (NITROSTAT) 0.4 MG SL tablet Place 1 tablet (0.4 mg total) under the tongue every 5 (five) minutes as needed for chest pain. 12/07/16   Burtis Junes, NP  oxybutynin (DITROPAN-XL) 10 MG 24 hr tablet Take 10 mg by mouth daily.  05/09/15   [provider]  rOPINIRole (REQUIP) 1 MG tablet Take 1 tablet (1 mg total) by mouth See admin instructions. Take 1 tablet (1 mg) by mouth daily at 4pm and at bedtime Patient taking differently: Take 1 mg 2 (two) times daily by mouth. Take 1 tablet (1 mg) by mouth daily at 4pm and at bedtime 8 pm 06/29/16   Burchette, Alinda Sierras, MD  rosuvastatin (CRESTOR) 40 MG tablet Take 1 tablet (40 mg total) by mouth at bedtime. 12/07/16   Burtis Junes, NP  spironolactone (ALDACTONE) 25 MG tablet Take 1 tablet (25 mg total) by mouth daily. 12/07/16   Burtis Junes, NP  trimethoprim (TRIMPEX) 100 MG tablet Take 100 mg by mouth daily.  05/18/15   [provider]  trolamine salicylate (ASPERCREME) 10 % cream Apply 1 application every 12 (twelve) hours as needed topically for muscle pain.    [provider]  warfarin (COUMADIN) 2.5 MG tablet Take as directed by anticoagulation clinic. Patient taking differently: Take 2.5 mg by mouth every evening. Take as directed by anticoagulation clinic. 01/25/17   Burchette, Alinda Sierras, MD    Family History Family History  Problem Relation Age of Onset  . Cancer Mother        breast  . Diabetes Mother   .  Cancer Paternal Grandfather     Social History Social History   Tobacco Use  . Smoking status: Never Smoker  . Smokeless tobacco: Never Used  Substance Use Topics  . Alcohol use: No  . Drug use: No     Allergies   Ciprofloxacin; Demerol [meperidine]; Sulfa antibiotics; and Sulfacetamide sodium   Review of Systems Review of Systems   Physical Exam Updated Vital Signs BP 123/67   Pulse 87   Temp 98.7 F (37.1 C) (Oral)   Resp (!) 22   Ht 5\' 2"  (1.575 m)   Wt 93.4 kg (206 lb)   SpO2 96%   BMI 37.68 kg/m   Physical Exam   ED Treatments / Results  Labs (all labs ordered are listed, but only abnormal results are displayed) Labs Reviewed  CBC WITH DIFFERENTIAL/PLATELET  BRAIN NATRIURETIC PEPTIDE  COMPREHENSIVE METABOLIC PANEL  LIPASE, BLOOD  I-STAT TROPONIN, ED  I-STAT CHEM 8, ED    EKG  EKG Interpretation None       Radiology Dg Chest 2 View  Result Date: 08/22/2017 CLINICAL DATA:  Dyspnea EXAM: CHEST  2 VIEW COMPARISON:  08/13/2017 FINDINGS: Chronic lung disease with prominent lung markings. Left lower lobe airspace disease unchanged. Negative for heart failure or pneumonia. No significant pleural effusion. Mild cardiac enlargement Right shoulder replacement.  Lumbar fusion hardware. IMPRESSION: Chronic lung disease with scarring and left lower lobe atelectasis, stable. No interval change. Electronically Signed   By: Franchot Gallo M.D.   On: 08/22/2017 15:06    Procedures Procedures (including critical care time)  Medications Ordered in ED Medications  iopamidol (ISOVUE-300) 61 % injection (not administered)     Initial Impression / Assessment and Plan / ED Course  I have reviewed the triage vital signs and the nursing notes.  Pertinent labs & imaging results that were available during my care of the patient were reviewed by me and considered in my medical decision making (see chart for details).  Clinical Course as of Aug 24 1523  Tue Aug 23, 2017  1450 Patient has elevated potassium, however her EKG is unchanged. I suspect hemolysis as her kidney fx is at baseline. Potassium: (!) 5.5 [AH]  Clinical Course User Index [AH] Margarita Mail, PA-C    Patient repeat potassium is 3.9.  Clinically she appears to have CHF exacerbation and will be treated with IV Lasix.  Her CT scan shows inguinal hernia containing cecum however no evidence of obstruction she is not tender in this region.  She does have a positive Murphy sign on my examination and CT scan is showing cholelithiasis without cholecystitis.  Her liver enzymes are normal.  I do however feel she may have symptomatic cholelithiasis and I have spoken with Dr. Ninfa Linden who will consult on the patient.  Patient will be admitted by the hospitalist.  Final Clinical Impressions(s) / ED Diagnoses   Final diagnoses:  None    ED Discharge Orders    None       Margarita Mail, PA-C 08/23/17 1931    Pixie Casino, MD 08/25/17 1108

## 2017-08-23 NOTE — ED Notes (Signed)
Took patient to the bathroom patient is back in room resting in chair with call bell in reach

## 2017-08-23 NOTE — ED Notes (Signed)
Pt c/o pain with moment for repositioning.

## 2017-08-23 NOTE — ED Triage Notes (Signed)
Per EMS: Pt from [facility] with c/o of left sided abdominal pain and bilateral lower extremity swelling.  Family states an 11 lb weight gain x 1 week.  Pt is on Norco for chronic back pain and on Cardizem for chronic A-Fib.  Pt reports having a recent chest XR that was negative.  PTA vitals: 120/78 BP, 100 HR (irregular), SP02 95% on RA.

## 2017-08-23 NOTE — ED Notes (Signed)
Patient transported to Ultrasound 

## 2017-08-23 NOTE — Progress Notes (Signed)
ANTICOAGULATION CONSULT NOTE - Initial Consult  Pharmacy Consult for warfarin  Indication: atrial fibrillation  Allergies  Allergen Reactions  . Ciprofloxacin Nausea And Vomiting  . Demerol [Meperidine] Nausea And Vomiting  . Sulfa Antibiotics Nausea And Vomiting and Other (See Comments)    And all derivatives -  GI Bleeding (?)  . Sulfacetamide Sodium Nausea And Vomiting and Other (See Comments)    GI bleeding, also (?)    Patient Measurements: Height: 5\' 2"  (157.5 cm) Weight: 206 lb (93.4 kg) IBW/kg (Calculated) : 50.1   Vital Signs: Temp: 98.7 F (37.1 C) (11/27 1126) Temp Source: Oral (11/27 1126) BP: 113/79 (11/27 1900) Pulse Rate: 107 (11/27 1900)  Labs: Recent Labs    08/23/17 1350  HGB 14.4  HCT 42.7  PLT 206  LABPROT 22.9*  INR 2.04  CREATININE 1.17*    Estimated Creatinine Clearance: 39.4 mL/min (A) (by C-G formula based on SCr of 1.17 mg/dL (H)).   Medical History: Past Medical History:  Diagnosis Date  . Arthritis   . Diastolic dysfunction    preserved EF,  CHF In setting of afib previously  . History of blood transfusion   . Hyperlipidemia   . Hypertension   . Incontinence of urine   . OSA (obstructive sleep apnea)    does not use CPAP  . Osteoporosis   . Persistent atrial fibrillation (HCC)    failed on Flecainide, Tikosyn, and amiodarone  . Renal calculi   . Restless legs   . Rheumatic fever    age 20  . Systolic heart failure (HCC)    EF 30 to 35% per echo 08/2013,  subsequently has normalized    Medications:  Medications Prior to Admission  Medication Sig Dispense Refill Last Dose  . acetaminophen (TYLENOL) 500 MG tablet Take 500 mg 2 (two) times daily by mouth.   08/22/2017 at 1700  . diazepam (VALIUM) 2 MG tablet Take 1 tablet (2 mg total) at bedtime as needed by mouth for muscle spasms. 5 tablet 0 PRN at PRN  . diltiazem (CARDIZEM CD) 240 MG 24 hr capsule Take 240 mg by mouth 2 (two) times daily.   08/23/2017 at 0800  .  diltiazem (CARDIZEM) 30 MG tablet Take 30 mg by mouth every 6 (six) hours as needed (for A-FIB; HOLD & CALL MD FOR PULSE >120).    PRN at PRN  . docusate sodium (COLACE) 100 MG capsule Take 100 mg by mouth daily.    08/23/2017 at 0800  . donepezil (ARICEPT ODT) 10 MG disintegrating tablet Take 1 tablet (10 mg total) by mouth at bedtime. 90 tablet 2 08/22/2017 at 2000  . escitalopram (LEXAPRO) 10 MG tablet TAKE 1 TABLET EVERY DAY (Patient taking differently: Take 10 mg by mouth once a day) 90 tablet 1 08/23/2017 at 0800  . furosemide (LASIX) 40 MG tablet Take 1 tablet (40 mg total) by mouth daily. 90 tablet 3 08/23/2017 at 0800  . gabapentin (NEURONTIN) 100 MG capsule Take 100 mg by mouth at bedtime.   08/22/2017 at 2000  . HYDROcodone-acetaminophen (NORCO) 7.5-325 MG tablet Take 1 tablet every 6 (six) hours as needed by mouth for moderate pain. (Patient taking differently: Take 1 tablet by mouth See admin instructions. 1 tablet three times a day and may take an additional 2 doses as needed for nighttime pain between 2000 and 0800) 20 tablet 0 08/23/2017 at 0800  . Magnesium 500 MG TABS Take 500 mg by mouth at bedtime.   08/22/2017 at  2000  . Melatonin 5 MG TABS Take 5 mg at bedtime by mouth.    08/22/2017 at 2200  . memantine (NAMENDA) 10 MG tablet Take 10 mg 2 (two) times daily by mouth.   08/23/2017 at 0800  . Multiple Vitamins-Minerals (PRESERVISION AREDS) CAPS Take 1 capsule by mouth 2 (two) times daily.   08/23/2017 at 0800  . nitroGLYCERIN (NITROSTAT) 0.4 MG SL tablet Place 1 tablet (0.4 mg total) under the tongue every 5 (five) minutes as needed for chest pain. (Patient taking differently: Place 0.4 mg under the tongue every 5 (five) minutes x 3 doses as needed for chest pain. ) 25 tablet 3 PRN at PRN  . oxybutynin (DITROPAN-XL) 10 MG 24 hr tablet Take 10 mg by mouth at bedtime.   11 08/22/2017 at 2200  . rOPINIRole (REQUIP) 1 MG tablet Take 1 tablet (1 mg total) by mouth See admin instructions.  Take 1 tablet (1 mg) by mouth daily at 4pm and at bedtime (Patient taking differently: Take 1 mg by mouth 2 (two) times daily. ) 180 tablet 2 08/22/2017 at 2000  . rosuvastatin (CRESTOR) 40 MG tablet Take 1 tablet (40 mg total) by mouth at bedtime. 90 tablet 3 08/22/2017 at 2000  . spironolactone (ALDACTONE) 25 MG tablet Take 1 tablet (25 mg total) by mouth daily. 90 tablet 3 08/23/2017 at 0800  . triamcinolone cream (KENALOG) 0.1 % Apply 1 application topically See admin instructions. 1 application applied topically to both breasts two times a day as needed for irritation   PRN at PRN  . trimethoprim (TRIMPEX) 100 MG tablet Take 100 mg by mouth daily.   11 08/23/2017 at 0800  . trolamine salicylate (ASPERCREME) 10 % cream Apply 1 application every 12 (twelve) hours as needed topically for muscle pain.   PRN at PRN  . warfarin (COUMADIN) 2.5 MG tablet Take as directed by anticoagulation clinic. (Patient taking differently: Take 2.5 mg by mouth every evening. ) 105 tablet 1 08/22/2017 at 1700  . Misc. Devices (ROLLER [place]) MISC Used as Directed. 1 each 0 Unk at Unk   Scheduled:  . diltiazem  240 mg Oral BID  . docusate sodium  100 mg Oral BID  . donepezil  10 mg Oral QHS  . [START ON 08/24/2017] escitalopram  10 mg Oral Daily  . furosemide  40 mg Intravenous Once  . [START ON 08/24/2017] furosemide  60 mg Intravenous BID  . gabapentin  100 mg Oral QHS  . Melatonin  6 mg Oral QHS  . memantine  10 mg Oral BID  . multivitamin  1 tablet Oral Daily  . oxybutynin  10 mg Oral Daily  . rOPINIRole  1 mg Oral BID  . rosuvastatin  40 mg Oral QHS  . [START ON 08/24/2017] spironolactone  25 mg Oral Daily    Assessment: 83 yo female from NH here with abdominal pain . She is on coumadin PTA and pharmacy consulted to dose -INR= 2.04  Home coumadin dose: 2.5mg  po daily (last dose 11/26)  Goal of Therapy:  INR 2-3 Monitor platelets by anticoagulation protocol: Yes   Plan:  -Coumadin 2.5mg  po  tonight -Daily PT/INR  Hildred Laser, Pharm D 08/23/2017 8:14 PM

## 2017-08-24 ENCOUNTER — Observation Stay (HOSPITAL_BASED_OUTPATIENT_CLINIC_OR_DEPARTMENT_OTHER): Payer: Medicare Other

## 2017-08-24 ENCOUNTER — Observation Stay (HOSPITAL_COMMUNITY): Payer: Medicare Other

## 2017-08-24 DIAGNOSIS — Z7901 Long term (current) use of anticoagulants: Secondary | ICD-10-CM | POA: Diagnosis not present

## 2017-08-24 DIAGNOSIS — R4189 Other symptoms and signs involving cognitive functions and awareness: Secondary | ICD-10-CM | POA: Diagnosis present

## 2017-08-24 DIAGNOSIS — I509 Heart failure, unspecified: Secondary | ICD-10-CM | POA: Diagnosis not present

## 2017-08-24 DIAGNOSIS — Z882 Allergy status to sulfonamides status: Secondary | ICD-10-CM | POA: Diagnosis not present

## 2017-08-24 DIAGNOSIS — M7989 Other specified soft tissue disorders: Secondary | ICD-10-CM

## 2017-08-24 DIAGNOSIS — K4090 Unilateral inguinal hernia, without obstruction or gangrene, not specified as recurrent: Secondary | ICD-10-CM | POA: Diagnosis not present

## 2017-08-24 DIAGNOSIS — I482 Chronic atrial fibrillation: Secondary | ICD-10-CM | POA: Diagnosis not present

## 2017-08-24 DIAGNOSIS — E875 Hyperkalemia: Secondary | ICD-10-CM | POA: Diagnosis present

## 2017-08-24 DIAGNOSIS — R1012 Left upper quadrant pain: Secondary | ICD-10-CM | POA: Diagnosis not present

## 2017-08-24 DIAGNOSIS — I481 Persistent atrial fibrillation: Secondary | ICD-10-CM

## 2017-08-24 DIAGNOSIS — N183 Chronic kidney disease, stage 3 (moderate): Secondary | ICD-10-CM | POA: Diagnosis not present

## 2017-08-24 DIAGNOSIS — Z66 Do not resuscitate: Secondary | ICD-10-CM | POA: Diagnosis not present

## 2017-08-24 DIAGNOSIS — E7849 Other hyperlipidemia: Secondary | ICD-10-CM | POA: Diagnosis not present

## 2017-08-24 DIAGNOSIS — G4733 Obstructive sleep apnea (adult) (pediatric): Secondary | ICD-10-CM | POA: Diagnosis not present

## 2017-08-24 DIAGNOSIS — I10 Essential (primary) hypertension: Secondary | ICD-10-CM | POA: Diagnosis not present

## 2017-08-24 DIAGNOSIS — R1011 Right upper quadrant pain: Secondary | ICD-10-CM | POA: Diagnosis not present

## 2017-08-24 DIAGNOSIS — I340 Nonrheumatic mitral (valve) insufficiency: Secondary | ICD-10-CM | POA: Diagnosis not present

## 2017-08-24 DIAGNOSIS — Z888 Allergy status to other drugs, medicaments and biological substances status: Secondary | ICD-10-CM | POA: Diagnosis not present

## 2017-08-24 DIAGNOSIS — I5043 Acute on chronic combined systolic (congestive) and diastolic (congestive) heart failure: Secondary | ICD-10-CM | POA: Diagnosis not present

## 2017-08-24 DIAGNOSIS — K8020 Calculus of gallbladder without cholecystitis without obstruction: Secondary | ICD-10-CM | POA: Diagnosis not present

## 2017-08-24 DIAGNOSIS — M810 Age-related osteoporosis without current pathological fracture: Secondary | ICD-10-CM | POA: Diagnosis not present

## 2017-08-24 DIAGNOSIS — I5033 Acute on chronic diastolic (congestive) heart failure: Secondary | ICD-10-CM | POA: Diagnosis not present

## 2017-08-24 DIAGNOSIS — R110 Nausea: Secondary | ICD-10-CM | POA: Diagnosis not present

## 2017-08-24 DIAGNOSIS — Z96659 Presence of unspecified artificial knee joint: Secondary | ICD-10-CM | POA: Diagnosis not present

## 2017-08-24 DIAGNOSIS — E785 Hyperlipidemia, unspecified: Secondary | ICD-10-CM | POA: Diagnosis not present

## 2017-08-24 DIAGNOSIS — Z79899 Other long term (current) drug therapy: Secondary | ICD-10-CM | POA: Diagnosis not present

## 2017-08-24 DIAGNOSIS — I130 Hypertensive heart and chronic kidney disease with heart failure and stage 1 through stage 4 chronic kidney disease, or unspecified chronic kidney disease: Secondary | ICD-10-CM | POA: Diagnosis not present

## 2017-08-24 LAB — COMPREHENSIVE METABOLIC PANEL
ALBUMIN: 3.4 g/dL — AB (ref 3.5–5.0)
ALT: 21 U/L (ref 14–54)
AST: 24 U/L (ref 15–41)
Alkaline Phosphatase: 111 U/L (ref 38–126)
Anion gap: 8 (ref 5–15)
BUN: 18 mg/dL (ref 6–20)
CHLORIDE: 103 mmol/L (ref 101–111)
CO2: 25 mmol/L (ref 22–32)
Calcium: 9.2 mg/dL (ref 8.9–10.3)
Creatinine, Ser: 1.16 mg/dL — ABNORMAL HIGH (ref 0.44–1.00)
GFR calc Af Amer: 49 mL/min — ABNORMAL LOW (ref >60)
GFR calc non Af Amer: 43 mL/min — ABNORMAL LOW (ref >60)
GLUCOSE: 79 mg/dL (ref 65–99)
POTASSIUM: 4.1 mmol/L (ref 3.5–5.1)
SODIUM: 136 mmol/L (ref 135–145)
Total Bilirubin: 1 mg/dL (ref 0.3–1.2)
Total Protein: 6.2 g/dL — ABNORMAL LOW (ref 6.5–8.1)

## 2017-08-24 LAB — PROTIME-INR
INR: 2.02
Prothrombin Time: 22.6 seconds — ABNORMAL HIGH (ref 11.4–15.2)

## 2017-08-24 LAB — CBC
HEMATOCRIT: 40.7 % (ref 36.0–46.0)
HEMOGLOBIN: 13.4 g/dL (ref 12.0–15.0)
MCH: 32.4 pg (ref 26.0–34.0)
MCHC: 32.9 g/dL (ref 30.0–36.0)
MCV: 98.5 fL (ref 78.0–100.0)
Platelets: 182 10*3/uL (ref 150–400)
RBC: 4.13 MIL/uL (ref 3.87–5.11)
RDW: 15.3 % (ref 11.5–15.5)
WBC: 5.3 10*3/uL (ref 4.0–10.5)

## 2017-08-24 MED ORDER — WARFARIN SODIUM 2.5 MG PO TABS
2.5000 mg | ORAL_TABLET | Freq: Once | ORAL | Status: AC
  Administered 2017-08-24: 2.5 mg via ORAL
  Filled 2017-08-24: qty 1

## 2017-08-24 MED ORDER — TECHNETIUM TC 99M MEBROFENIN IV KIT
5.0000 | PACK | Freq: Once | INTRAVENOUS | Status: AC | PRN
  Administered 2017-08-24: 5 via INTRAVENOUS

## 2017-08-24 MED ORDER — FUROSEMIDE 10 MG/ML IJ SOLN
80.0000 mg | Freq: Two times a day (BID) | INTRAMUSCULAR | Status: DC
  Administered 2017-08-24 – 2017-08-25 (×3): 80 mg via INTRAVENOUS
  Filled 2017-08-24 (×3): qty 8

## 2017-08-24 MED ORDER — DILTIAZEM HCL ER COATED BEADS 240 MG PO CP24
240.0000 mg | ORAL_CAPSULE | Freq: Every day | ORAL | Status: DC
  Administered 2017-08-25 – 2017-08-26 (×2): 240 mg via ORAL
  Filled 2017-08-24 (×2): qty 1

## 2017-08-24 MED ORDER — METOPROLOL SUCCINATE ER 25 MG PO TB24
25.0000 mg | ORAL_TABLET | Freq: Every day | ORAL | Status: DC
  Administered 2017-08-24 – 2017-08-26 (×3): 25 mg via ORAL
  Filled 2017-08-24 (×3): qty 1

## 2017-08-24 NOTE — Evaluation (Signed)
Occupational Therapy Evaluation Patient Details Name: Angel Meyer MRN: [id number] DOB: 05-07-1933 Today's Date: 08/24/2017    History of Present Illness Pt is an 83 y.o. female admitted from [facility] on 08/23/17 with CHF exacerbation and worsening abdominal pain. CT scan showed cholelithiasis and small R inguinal hernia containing the cecum. Ultrasound showed gallstones with mild gallbladder distention and a possible Murphy sign but no other evidence of cholecystitis. Pertinent PMH includes afib, chronic back pain from compression fxs, OSA, HTN, urine incontinence, restless legs.   Clinical Impression   PTA, pt was using Rollator for functional mobility and was independent with basic ADL. She does have assistance from ALF for meals and medications. Pt currently requires overall min guard assist for safety with LB ADL and ADL transfers. She presents with back pain, generalized weakness, and decreased activity tolerance for ADL impacting her ability to participate in ADL at [facility]. Pt would benefit from continued OT services while admitted to improve independence and safety with ADL. Recommend home health OT services once pt has returned to ALF.    Follow Up Recommendations  Home health OT;Supervision/Assistance - 24 hour    Equipment Recommendations  3 in 1 bedside commode    Recommendations for Other Services       Precautions / Restrictions Precautions Precautions: Fall Restrictions Weight Bearing Restrictions: No      Mobility Bed Mobility               General bed mobility comments: OOB in chair on arrival.   Transfers Overall transfer level: Needs assistance Equipment used: Rolling walker (2 wheeled) Transfers: Sit to/from Stand Sit to Stand: Supervision         General transfer comment: Stood from bed and toilet with RW    Balance Overall balance assessment: Needs assistance Sitting-balance support: No upper extremity supported;Feet supported Sitting  balance-Leahy Scale: Good     Standing balance support: No upper extremity supported;Bilateral upper extremity supported;During functional activity Standing balance-Leahy Scale: Fair Standing balance comment: Min guard assist for dynamic standing tasks.                            ADL either performed or assessed with clinical judgement   ADL Overall ADL's : Needs assistance/impaired Eating/Feeding: Set up;Sitting   Grooming: Min guard;Standing   Upper Body Bathing: Set up;Sitting   Lower Body Bathing: Min guard;Sit to/from stand   Upper Body Dressing : Set up;Sitting   Lower Body Dressing: Min guard;Sit to/from stand   Toilet Transfer: Min guard;Ambulation;RW Toilet Transfer Details (indicate cue type and reason): Simulated with sit<>stand followed by ambulation in room.          Functional mobility during ADLs: Min guard;Rolling walker General ADL Comments: Pt slightly unsteady and moving slowly throughout.      Vision Baseline Vision/History: Wears glasses Wears Glasses: Reading only Patient Visual Report: No change from baseline Vision Assessment?: No apparent visual deficits     Perception     Praxis      Pertinent Vitals/Pain Pain Assessment: Faces Faces Pain Scale: Hurts little more Pain Location: R lower back and abdomen Pain Descriptors / Indicators: Grimacing;Discomfort Pain Intervention(s): Monitored during session     Hand Dominance     Extremity/Trunk Assessment Upper Extremity Assessment Upper Extremity Assessment: Generalized weakness   Lower Extremity Assessment Lower Extremity Assessment: Generalized weakness       Communication Communication Communication: No difficulties   Cognition Arousal/Alertness: Awake/alert  Behavior During Therapy: WFL for tasks assessed/performed Overall Cognitive Status: Within Functional Limits for tasks assessed                                     General Comments  Daughter  present during session.     Exercises     Shoulder Instructions      Home Living Family/patient expects to be discharged to:: Assisted living                             Home Equipment: Gilford Rile - 4 wheels   Additional Comments: Lives in [place] apartment at [place].       Prior Functioning/Environment Level of Independence: Needs assistance  Gait / Transfers Assistance Needed: Uses rollator ADL's / Homemaking Assistance Needed: Facility provides meals in dining room. Makes toast in her room and does have a microwave. Independent with basic ADL.    Comments: Mod indep with amb using rollator. Meals and cleaning services provided. Indep with bathing, dressing, pericare. Was participating in [facility]'s HHPT and HHOT        OT Problem List: Decreased strength;Decreased range of motion;Decreased activity tolerance;Impaired balance (sitting and/or standing);Decreased safety awareness;Decreased knowledge of use of DME or AE;Decreased knowledge of precautions;Pain      OT Treatment/Interventions:      OT Goals(Current goals can be found in the care plan section) Acute Rehab OT Goals Patient Stated Goal: Return home to ALF OT Goal Formulation: With patient Time For Goal Achievement: 09/07/17 Potential to Achieve Goals: Good ADL Goals Pt Will Perform Grooming: with modified independence;sitting Pt Will Perform Lower Body Dressing: with modified independence;sit to/from stand Pt Will Transfer to Toilet: with modified independence;ambulating(handicapped height toilet) Pt Will Perform Toileting - Clothing Manipulation and hygiene: with modified independence;sit to/from stand  OT Frequency: Min 2X/week   Barriers to D/C:            Co-evaluation              AM-PAC PT "6 Clicks" Daily Activity     Outcome Measure Help from another person eating meals?: A Little Help from another person taking care of personal grooming?: A Little Help from another  person toileting, which includes using toliet, bedpan, or urinal?: A Little Help from another person bathing (including washing, rinsing, drying)?: A Little Help from another person to put on and taking off regular upper body clothing?: A Little Help from another person to put on and taking off regular lower body clothing?: A Little 6 Click Score: 18   End of Session Equipment Utilized During Treatment: Gait belt;Rolling walker Nurse Communication: Mobility status  Activity Tolerance: Patient tolerated treatment well Patient left: in chair;with call bell/phone within reach;with family/visitor present  OT Visit Diagnosis: Other abnormalities of gait and mobility (R26.89);Muscle weakness (generalized) (M62.81)                Time: 5621-3086 OT Time Calculation (min): 15 min Charges:  OT General Charges $OT Visit: 1 Visit OT Evaluation $OT Eval Moderate Complexity: 1 Mod G-Codes: OT G-codes **NOT FOR INPATIENT CLASS** Functional Assessment Tool Used: Clinical judgement Functional Limitation: Self care Self Care Current Status (V7846): At least 1 percent but less than 20 percent impaired, limited or restricted Self Care Goal Status (N6295): 0 percent impaired, limited or restricted   Norman Herrlich, MS OTR/L  Pager: [place] 08/24/2017, 6:02 PM

## 2017-08-24 NOTE — Progress Notes (Signed)
ANTICOAGULATION CONSULT NOTE -Follow up  Pharmacy Consult for warfarin  Indication: atrial fibrillation  Allergies  Allergen Reactions  . Ciprofloxacin Nausea And Vomiting  . Demerol [Meperidine] Nausea And Vomiting  . Sulfa Antibiotics Nausea And Vomiting and Other (See Comments)    And all derivatives -  GI Bleeding (?)  . Sulfacetamide Sodium Nausea And Vomiting and Other (See Comments)    GI bleeding, also (?)    Patient Measurements: Height: 5\' 1"  (154.9 cm) Weight: 194 lb 7.1 oz (88.2 kg) IBW/kg (Calculated) : 47.8   Vital Signs: Temp: 97.9 F (36.6 C) (11/28 0800) Temp Source: Oral (11/28 0800) BP: 102/72 (11/28 0800) Pulse Rate: 84 (11/28 0800)  Labs: Recent Labs    08/23/17 1350 08/24/17 0526  HGB 14.4 13.4  HCT 42.7 40.7  PLT 206 182  LABPROT 22.9* 22.6*  INR 2.04 2.02  CREATININE 1.17* 1.16*    Estimated Creatinine Clearance: 37.8 mL/min (A) (by C-G formula based on SCr of 1.16 mg/dL (H)).   Medical History: Past Medical History:  Diagnosis Date  . Arthritis   . Diastolic dysfunction    preserved EF,  CHF In setting of afib previously  . History of blood transfusion   . Hyperlipidemia   . Hypertension   . Incontinence of urine   . OSA (obstructive sleep apnea)    does not use CPAP  . Osteoporosis   . Persistent atrial fibrillation (HCC)    failed on Flecainide, Tikosyn, and amiodarone  . Renal calculi   . Restless legs   . Rheumatic fever    age 28  . Systolic heart failure (HCC)    EF 30 to 35% per echo 08/2013,  subsequently has normalized    Medications:  Medications Prior to Admission  Medication Sig Dispense Refill Last Dose  . acetaminophen (TYLENOL) 500 MG tablet Take 500 mg 2 (two) times daily by mouth.   08/22/2017 at 1700  . diazepam (VALIUM) 2 MG tablet Take 1 tablet (2 mg total) at bedtime as needed by mouth for muscle spasms. 5 tablet 0 PRN at PRN  . diltiazem (CARDIZEM CD) 240 MG 24 hr capsule Take 240 mg by mouth 2 (two)  times daily.   08/23/2017 at 0800  . diltiazem (CARDIZEM) 30 MG tablet Take 30 mg by mouth every 6 (six) hours as needed (for A-FIB; HOLD & CALL MD FOR PULSE >120).    PRN at PRN  . docusate sodium (COLACE) 100 MG capsule Take 100 mg by mouth daily.    08/23/2017 at 0800  . donepezil (ARICEPT ODT) 10 MG disintegrating tablet Take 1 tablet (10 mg total) by mouth at bedtime. 90 tablet 2 08/22/2017 at 2000  . escitalopram (LEXAPRO) 10 MG tablet TAKE 1 TABLET EVERY DAY (Patient taking differently: Take 10 mg by mouth once a day) 90 tablet 1 08/23/2017 at 0800  . furosemide (LASIX) 40 MG tablet Take 1 tablet (40 mg total) by mouth daily. 90 tablet 3 08/23/2017 at 0800  . gabapentin (NEURONTIN) 100 MG capsule Take 100 mg by mouth at bedtime.   08/22/2017 at 2000  . HYDROcodone-acetaminophen (NORCO) 7.5-325 MG tablet Take 1 tablet every 6 (six) hours as needed by mouth for moderate pain. (Patient taking differently: Take 1 tablet by mouth See admin instructions. 1 tablet three times a day and may take an additional 2 doses as needed for nighttime pain between 2000 and 0800) 20 tablet 0 08/23/2017 at 0800  . Magnesium 500 MG TABS Take 500  mg by mouth at bedtime.   08/22/2017 at 2000  . Melatonin 5 MG TABS Take 5 mg at bedtime by mouth.    08/22/2017 at 2200  . memantine (NAMENDA) 10 MG tablet Take 10 mg 2 (two) times daily by mouth.   08/23/2017 at 0800  . Multiple Vitamins-Minerals (PRESERVISION AREDS) CAPS Take 1 capsule by mouth 2 (two) times daily.   08/23/2017 at 0800  . nitroGLYCERIN (NITROSTAT) 0.4 MG SL tablet Place 1 tablet (0.4 mg total) under the tongue every 5 (five) minutes as needed for chest pain. (Patient taking differently: Place 0.4 mg under the tongue every 5 (five) minutes x 3 doses as needed for chest pain. ) 25 tablet 3 PRN at PRN  . oxybutynin (DITROPAN-XL) 10 MG 24 hr tablet Take 10 mg by mouth at bedtime.   11 08/22/2017 at 2200  . rOPINIRole (REQUIP) 1 MG tablet Take 1 tablet (1 mg  total) by mouth See admin instructions. Take 1 tablet (1 mg) by mouth daily at 4pm and at bedtime (Patient taking differently: Take 1 mg by mouth 2 (two) times daily. ) 180 tablet 2 08/22/2017 at 2000  . rosuvastatin (CRESTOR) 40 MG tablet Take 1 tablet (40 mg total) by mouth at bedtime. 90 tablet 3 08/22/2017 at 2000  . spironolactone (ALDACTONE) 25 MG tablet Take 1 tablet (25 mg total) by mouth daily. 90 tablet 3 08/23/2017 at 0800  . triamcinolone cream (KENALOG) 0.1 % Apply 1 application topically See admin instructions. 1 application applied topically to both breasts two times a day as needed for irritation   PRN at PRN  . trimethoprim (TRIMPEX) 100 MG tablet Take 100 mg by mouth daily.   11 08/23/2017 at 0800  . trolamine salicylate (ASPERCREME) 10 % cream Apply 1 application every 12 (twelve) hours as needed topically for muscle pain.   PRN at PRN  . warfarin (COUMADIN) 2.5 MG tablet Take as directed by anticoagulation clinic. (Patient taking differently: Take 2.5 mg by mouth every evening. ) 105 tablet 1 08/22/2017 at 1700  . Misc. Devices (ROLLER [place]) MISC Used as Directed. 1 each 0 Unk at Unk   Scheduled:  . diltiazem  240 mg Oral BID  . docusate sodium  100 mg Oral BID  . donepezil  10 mg Oral QHS  . escitalopram  10 mg Oral Daily  . furosemide  60 mg Intravenous BID  . gabapentin  100 mg Oral QHS  . Melatonin  6 mg Oral QHS  . memantine  10 mg Oral BID  . multivitamin  1 tablet Oral Daily  . oxybutynin  10 mg Oral Daily  . rOPINIRole  1 mg Oral BID  . rosuvastatin  40 mg Oral QHS  . spironolactone  25 mg Oral Daily  . Warfarin - Pharmacist Dosing Inpatient   Does not apply q1800    Assessment: 83 yo female from NH admitted 08/23/17 with CHF exacerbation and worsening  abdominal pain . She is on coumadin PTA for h/o Afib, with therapeutic INR and pharmacy consulted to dose.  -INR= 2.02 this morning, remains therapeutic with continued usual coumadin dose. CBC wnl/stable. No  bleeding reported.  Home coumadin dose: 2.5mg  po daily (last dose 11/26)  Goal of Therapy:  INR 2-3 Monitor platelets by anticoagulation protocol: Yes   Plan:  -Coumadin 2.5mg  po tonight -Daily PT/INR  Nicole Cella, RPh Clinical Pharmacist 8A-4P [phone] [id number] [phone] [place] [phone]  08/24/2017 12:23 PM

## 2017-08-24 NOTE — Clinical Social Work Note (Signed)
Clinical Social Work Assessment  Patient Details  Name: Angel Meyer MRN: [id number] Date of Birth: 1933/09/17  Date of referral:  08/24/17               Reason for consult:  Discharge Planning                Permission sought to share information with:  Facility Sport and exercise psychologist, Family Supports Permission granted to share information::  Yes, Verbal Permission Granted  Name::     Angel Meyer  Agency::  [facility]  Relationship::  Daughter  Contact Information:  [phone]  Housing/Transportation Living arrangements for the past 2 months:  [place] of Information:  Medical Team, Adult Children, Facility Patient Interpreter Needed:  None Criminal Activity/Legal Involvement Pertinent to Current Situation/Hospitalization:  No - Comment as needed Significant Relationships:  Adult Children Lives with:  Facility Resident Do you feel safe going back to the place where you live?  Yes Need for family participation in patient care:  Yes (Comment)  Care giving concerns:  Patient is from [facility].   Social Worker assessment / plan:  Patient not in room but daughter available. Daughter said patient was down for a test. CSW introduced role and explained that discharge planning would be discussed. Patient's daughter confirmed that patient is from [facility] and the plan is for her to return once stable for discharge. Patient's daughter wants to wait and see how she is doing on day of discharge before determining if she can transport her back to the facility or if she will need PTAR. Patient's daughter stated that she is already getting PT at the facility and has gotten OT as well. No further concerns. CSW encouraged patient's daughter to contact CSW as needed. CSW will continue to follow patient and her daughter for support and facilitate discharge back to ALF once medically stable.  Employment status:  Retired Forensic scientist:  Medicare PT  Recommendations:  Home with [place] / Referral to community resources:  Other (Comment Required)(Plan is to return to ALF)  Patient/Family's Response to care:  Patient not available. Patient's daughter agreeable to return to ALF. Patient's daughter supportive and involved in patient's care. Patient's daughter appreciated social work intervention.  Patient/Family's Understanding of and Emotional Response to Diagnosis, Current Treatment, and Prognosis:  Patient not available. Patient's daughter has a good understanding of the reason for admission and the plan for her to return to ALF once medically stable. Patient's daughter appears happy with hospital care.  Emotional Assessment Appearance:  Appears stated age Attitude/Demeanor/Rapport:  Unable to Assess Affect (typically observed):  Unable to Assess Orientation:  Oriented to Self, Oriented to Place, Oriented to  Time, Oriented to Situation Alcohol / Substance use:  Never Used Psych involvement (Current and /or in the community):  No (Comment)  Discharge Needs  Concerns to be addressed:  Care Coordination Readmission within the last 30 days:  No Current discharge risk:  None Barriers to Discharge:  Continued Medical Work up   Candie Chroman, LCSW 08/24/2017, 12:29 PM

## 2017-08-24 NOTE — Progress Notes (Signed)
PROGRESS NOTE  Angel Meyer  JSE:[id number] DOB: Mar 26, 1933 DOA: 08/23/2017 PCP: [name], MD   Brief Narrative: Angel Meyer is an 83 y.o. female with a history of chronic atrial fibrillation on coumadin, CHF with exacerbation, chronic back pain, hypertension, hyperlipidemia, obstructive sleep apnea, osteoarthritis, urinary incontinence, restless leg syndrome, renal stones and cognitive impairment presented with complaints of abdominal pain.  Patient states that she has been having left-sided abdominal pain for the last week, progressively getting worse, sharp, up to 7-8 out of 10 in intensity associated with some right flank pain and nausea but no vomiting or diarrhea.  The pain has gotten worse despite taking pain medications.  Patient also complains of worsening abdominal swelling along with swelling of her legs with some shortness of breath.  Patient was seen in A. fib clinic yesterday and was asked to take an extra dose of Lasix.  She did not notice much increase in her urination.  No fever, chest pain, dysuria, hematuria, loss of consciousness or seizures.  ED Course: Patient had CT scan of the abdomen which showed gallstones and right inguinal hernia without incarceration, with diffuse diverticulosis without diverticulitis.  She was given intravenous Lasix.  General surgery was called by the ED provider.  Hospitalist service was called to evaluate the patient  LE DVU was ruled out with U/S. HIDA scan was normal. Diuresis has not been very significant since admission, so IV lasix dose increased.  Assessment & Plan: Active Problems:   Hypertension   Hyperlipidemia   Atrial fibrillation (HCC)   Abdominal pain   Cholelithiasis   Acute on chronic diastolic CHF (congestive heart failure) (HCC)   CHF exacerbation (HCC)  Acute on chronic diastolic congestive heart failure: EDW thought to be ~189lbs, was 196lbs (possibly 206lbs by ED scale?) on admission. Echo on 05/13/2017 had shown EF  of 55-60% with mild to moderate mitral regurgitation - Continue lasix IV q12h (60mg  > 80mg ), recheck BMP in AM.  - Daily weights, strict I/O - Cardiology consulted by admitting provider  Abdominal pain: Uncertain etiology. Appears most consistent with MSK etiology, seen by Dr. Nelva Bush orthopedics. - Monitor with diuresis (?gut edema) - HIDA scan as below.  Cholelithiasis, possible symptomatic cholecystitis: CT abdomen showed cholelithiasis without cholecystitis along with diffuse diverticulosis and right inguinal hernia. - HIDA scan ordered per surgery consultant - Pain and nausea symptom control per orders  Chronic atrial fibrillation on coumadin: Historically difficult to control rate, referred to AFib clinic.  - Currently rate controlled. Plan per cardiology is to titrate down CCB, start BB. - Continue coumadin, dosed as per pharmacy. Monitor INR.  Hypertension: - Continue cardizem, lasix, spironolactone.  Dyslipidemia: - Continue statin  Cognitive impairment - Continue memantine, lexapro and donepezil  Mild hyperkalemia: Resolved on recheck ?hemolysis.  - Monitor  Chronic kidney disease stage III: No evidence of AKI at admission.  - Monitor BMP  DVT prophylaxis: Coumadin Code Status: DNR Family Communication: Daughter at bedside Disposition Plan: Anticipate DC to ALF with home health as before once volume status improved.  Consultants:   Cardiology  General surgery  Procedures:   None  Antimicrobials:  None   Subjective: Not urinating very much. Abdominal pain better today than yesterday. No fevers.  Objective: Vitals:   08/23/17 2106 08/24/17 0520 08/24/17 0800 08/24/17 1230  BP: 140/82 103/88 102/72 (!) 128/104  Pulse: 86 92 84 69  Resp: 17 18 18 18   Temp:  97.7 F (36.5 C) 97.9 F (36.6 C) 98.1 F (36.7  C)  TempSrc:  Oral Oral Oral  SpO2: 97% 98% 94% 95%  Weight:  88.2 kg (194 lb 7.1 oz)    Height:        Intake/Output Summary (Last 24  hours) at 08/24/2017 1553 Last data filed at 08/24/2017 1400 Gross per 24 hour  Intake 775 ml  Output 425 ml  Net 350 ml   Filed Weights   08/23/17 1143 08/23/17 2010 08/24/17 0520  Weight: 93.4 kg (206 lb) 89.3 kg (196 lb 12.8 oz) 88.2 kg (194 lb 7.1 oz)    Gen: Pleasant elderly female in no distress Pulm: Non-labored breathing room air. Clear to auscultation bilaterally.  CV: Regular rate and rhythm. No murmur, rub, or gallop. No JVD, +LE edema. GI: Abdomen soft, not significantly tender anywhere, negative Murphy's. +BS Ext: Warm, no deformities Skin: No rashes, lesions ulcers Neuro: Alert and oriented. No focal neurological deficits. Psych: Judgement and insight appear normal. Mood & affect appropriate.   Data Reviewed: I have personally reviewed following labs and imaging studies  CBC: Recent Labs  Lab 08/23/17 1350 08/24/17 0526  WBC 5.7 5.3  NEUTROABS 4.0  --   HGB 14.4 13.4  HCT 42.7 40.7  MCV 97.7 98.5  PLT 206 366   Basic Metabolic Panel: Recent Labs  Lab 08/23/17 1350 08/23/17 1604 08/24/17 0526  NA 133*  --  136  K 5.5* 3.9 4.1  CL 99*  --  103  CO2 23  --  25  GLUCOSE 96  --  79  BUN 19  --  18  CREATININE 1.17*  --  1.16*  CALCIUM 9.3  --  9.2   GFR: Estimated Creatinine Clearance: 37.8 mL/min (A) (by C-G formula based on SCr of 1.16 mg/dL (H)). Liver Function Tests: Recent Labs  Lab 08/23/17 1350 08/24/17 0526  AST 52* 24  ALT 16 21  ALKPHOS 122 111  BILITOT 2.1* 1.0  PROT 6.8 6.2*  ALBUMIN 4.0 3.4*   Recent Labs  Lab 08/23/17 1350  LIPASE 22   No results for input(s): AMMONIA in the last 168 hours. Coagulation Profile: Recent Labs  Lab 08/23/17 1350 08/24/17 0526  INR 2.04 2.02   Cardiac Enzymes: No results for input(s): CKTOTAL, CKMB, CKMBINDEX, TROPONINI in the last 168 hours. BNP (last 3 results) No results for input(s): PROBNP in the last 8760 hours. HbA1C: No results for input(s): HGBA1C in the last 72  hours. CBG: No results for input(s): GLUCAP in the last 168 hours. Lipid Profile: No results for input(s): CHOL, HDL, LDLCALC, TRIG, CHOLHDL, LDLDIRECT in the last 72 hours. Thyroid Function Tests: No results for input(s): TSH, T4TOTAL, FREET4, T3FREE, THYROIDAB in the last 72 hours. Anemia Panel: No results for input(s): VITAMINB12, FOLATE, FERRITIN, TIBC, IRON, RETICCTPCT in the last 72 hours. Urine analysis:    Component Value Date/Time   COLORURINE YELLOW 08/13/2017 0400   APPEARANCEUR HAZY (A) 08/13/2017 0400   LABSPEC 1.043 (H) 08/13/2017 0400   PHURINE 6.0 08/13/2017 0400   GLUCOSEU NEGATIVE 08/13/2017 0400   HGBUR NEGATIVE 08/13/2017 0400   BILIRUBINUR NEGATIVE 08/13/2017 0400   BILIRUBINUR neg 08/03/2017 1153   KETONESUR 5 (A) 08/13/2017 0400   PROTEINUR NEGATIVE 08/13/2017 0400   UROBILINOGEN 0.2 08/03/2017 1153   NITRITE NEGATIVE 08/13/2017 0400   LEUKOCYTESUR NEGATIVE 08/13/2017 0400   Recent Results (from the past 240 hour(s))  MRSA PCR Screening     Status: None   Collection Time: 08/23/17  9:13 PM  Result Value Ref Range  Status   MRSA by PCR NEGATIVE NEGATIVE Final    Comment:        The GeneXpert MRSA Assay (FDA approved for NASAL specimens only), is one component of a comprehensive MRSA colonization surveillance program. It is not intended to diagnose MRSA infection nor to guide or monitor treatment for MRSA infections.       Radiology Studies: Nm Hepatobiliary Liver Func  Result Date: 08/24/2017 CLINICAL DATA:  Severe left upper quadrant pain.  Cholelithiasis. EXAM: NUCLEAR MEDICINE HEPATOBILIARY IMAGING TECHNIQUE: Sequential images of the abdomen were obtained out to 60 minutes following intravenous administration of radiopharmaceutical. RADIOPHARMACEUTICALS:  5.3 mCi Tc-29m  Choletec IV COMPARISON:  None. FINDINGS: Prompt uptake and biliary excretion of activity by the liver is seen. Gallbladder activity is visualized, consistent with patency of  cystic duct. Biliary activity passes into small bowel, consistent with patent common bile duct. IMPRESSION: Normal hepatobiliary imaging demonstrating patency of both cystic and common bile ducts. Electronically Signed   By: Earle Gell M.D.   On: 08/24/2017 14:43   Ct Abdomen Pelvis W Contrast  Result Date: 08/23/2017 CLINICAL DATA:  Left-sided abdominal pain and bilateral lower extremity swelling. EXAM: CT ABDOMEN AND PELVIS WITH CONTRAST TECHNIQUE: Multidetector CT imaging of the abdomen and pelvis was performed using the standard protocol following bolus administration of intravenous contrast. CONTRAST:  <See Chart> ISOVUE-300 IOPAMIDOL (ISOVUE-300) INJECTION 61% COMPARISON:  None. FINDINGS: Lower chest: Stable area of rounded atelectasis at the left lung base when compared to prior chest CT scan from 11/17 218. The heart is mildly enlarged and coronary artery calcifications are noted. Hepatobiliary: No focal hepatic lesions or intrahepatic biliary dilatation. Gallbladder is mildly distended. A small calcified gallstone is noted. No CT findings to suggest acute cholecystitis. Pancreas: No mass, inflammation or ductal dilatation. Spleen: Normal size.  Small calcified granulomas. Adrenals/Urinary Tract: The adrenal glands are unremarkable. No renal lesions or hydronephrosis. The delayed images do not demonstrate any significant collecting system abnormality cyst. No worrisome renal lesions. Stomach/Bowel: The stomach, duodenum, small bowel and colon are grossly normal. No acute inflammatory changes, mass lesions or obstructive findings. Diffuse colonic diverticulosis without definite findings for acute diverticulitis. The terminal ileum is normal. Vascular/Lymphatic: Advanced atherosclerotic calcifications involving aorta and iliac arteries. No aneurysm or dissection. The branch vessels are patent. The major venous structures are patent. The circumaortic left renal vein is noted. No mesenteric or  retroperitoneal mass or adenopathy. Reproductive: Surgically absent. Other: There is a right inguinal hernia containing the cecum just distal to the ileocecal valve. No findings for obstruction or incarceration but could be a source of pain. Musculoskeletal: No significant bony findings. There are vertebral augmentation changes noted at L3 and there is spinal stabilization hardware at L1-2. Remote appearing L1 fracture. IMPRESSION: 1. Cholelithiasis without CT findings for acute cholecystitis. 2. Right inguinal hernia containing the cecum. No obvious obstruction/incarceration. 3. Diffuse colonic diverticulosis without findings for acute diverticulitis. 4. Advanced vascular disease. Electronically Signed   By: Marijo Sanes M.D.   On: 08/23/2017 16:06   Dg Chest Port 1 View  Result Date: 08/23/2017 CLINICAL DATA:  Bibasilar crackers. Exertional dyspnea. Chronic shortness of breath. EXAM: PORTABLE CHEST 1 VIEW COMPARISON:  08/22/2017 FINDINGS: The cardiac silhouette is enlarged. Mediastinal contours appear intact. There is no evidence of focal airspace consolidation, pleural effusion or pneumothorax. Chronic linear peribronchial densities in the left lung base. Osseous structures are without acute abnormality. Soft tissues are grossly normal. IMPRESSION: Persistent linear peribronchial airspace opacities in the left  lung base. This may represent atelectasis, scarring or peribronchial airspace consolidation. Electronically Signed   By: Fidela Salisbury M.D.   On: 08/23/2017 13:31   US Abdomen Limited Ruq  Result Date: 08/23/2017 CLINICAL DATA:  83 year old with right upper quadrant pain. EXAM: ULTRASOUND ABDOMEN LIMITED RIGHT UPPER QUADRANT COMPARISON:  08/23/2017 FINDINGS: Gallbladder: Mild distention of the gallbladder. Gallbladder wall measures up to 0.2 cm. Echogenic foci in the gallbladder are suggestive for stones. Reportedly, the patient does have a sonographic Murphy sign. No evidence for  pericholecystic fluid. Common bile duct: Diameter: 0.3 cm. Liver: Liver is not well visualized on this examination. Liver is mildly heterogeneous. No significant biliary dilatation. Portal vein is patent on color Doppler imaging with normal direction of blood flow towards the liver. IMPRESSION: Cholelithiasis with mild gallbladder distension and a Murphy sign. Findings are concerning for acute cholecystitis. No biliary dilatation. Electronically Signed   By: Markus Daft M.D.   On: 08/23/2017 17:50    Scheduled Meds: . diltiazem  240 mg Oral BID  . docusate sodium  100 mg Oral BID  . donepezil  10 mg Oral QHS  . escitalopram  10 mg Oral Daily  . furosemide  60 mg Intravenous BID  . gabapentin  100 mg Oral QHS  . Melatonin  6 mg Oral QHS  . memantine  10 mg Oral BID  . multivitamin  1 tablet Oral Daily  . oxybutynin  10 mg Oral Daily  . rOPINIRole  1 mg Oral BID  . rosuvastatin  40 mg Oral QHS  . spironolactone  25 mg Oral Daily  . warfarin  2.5 mg Oral ONCE-1800  . Warfarin - Pharmacist Dosing Inpatient   Does not apply q1800   Continuous Infusions:   LOS: 0 days   Time spent: 25 minutes.  Vance Gather, MD [facility] Pager [phone]  If 7PM-7AM, please contact night-coverage [email or web address] Password [facility] 08/24/2017, 3:53 PM

## 2017-08-24 NOTE — Progress Notes (Signed)
Bilateral lower extremity completed. No evidence of DVT, superficial thrombosis, or Baker's cyst.  Toma Copier, RVS 08/24/2017, 9:17 AM

## 2017-08-24 NOTE — Evaluation (Signed)
Physical Therapy Evaluation Patient Details Name: Angel Meyer MRN: [id number] DOB: Apr 09, 1933 Today's Date: 08/24/2017   History of Present Illness  Pt is an 83 y.o. female admitted from [facility] on 08/23/17 with CHF exacerbation and worsening abdominal pain. CT scan showed cholelithiasis and small R inguinal hernia containing the cecum. Ultrasound showed gallstones with mild gallbladder distention and a possible Murphy sign but no other evidence of cholecystitis. Pertinent PMH includes afib, chronic back pain from compression fxs, OSA, HTN, urine incontinence, restless legs.    Clinical Impression  Pt presents with an overall decrease in functional mobility secondary to above. PTA, pt lives in single apartment at [facility] where she is mod indep with rollator; was participating with HHPT/OT services, and plans to continue with this upon d/c. Today, pt able to transfer 150' with rollator and supervision; further mobility limited by arrival of transport. Pt would benefit from continued acute PT services to maximize functional mobility and independence prior to d/c with continued HHPT/OT services at [facility].     Follow Up Recommendations Home health PT;Supervision - Intermittent    Equipment Recommendations  None recommended by PT    Recommendations for Other Services OT consult     Precautions / Restrictions Precautions Precautions: Fall Restrictions Weight Bearing Restrictions: No      Mobility  Bed Mobility Overal bed mobility: Modified Independent             General bed mobility comments: HOB elevated, but no physical assist required  Transfers Overall transfer level: Needs assistance Equipment used: Rolling walker (2 wheeled) Transfers: Sit to/from Stand Sit to Stand: Supervision         General transfer comment: Stood from bed and toilet with RW  Ambulation/Gait Ambulation/Gait assistance: Supervision Ambulation Distance (Feet): 150  Feet Assistive device: Rolling walker (2 wheeled) Gait Pattern/deviations: Step-through pattern;Decreased stride length;Trunk flexed Gait velocity: Decreased Gait velocity interpretation: <1.8 ft/sec, indicative of risk for recurrent falls General Gait Details: Supervision for safety. Cues for upright posture which pt has difficulty maintaining; c/o lower back pain with this  Stairs            Wheelchair Mobility    Modified Rankin (Stroke Patients Only)       Balance Overall balance assessment: Needs assistance   Sitting balance-Leahy Scale: Good Sitting balance - Comments: Donned socks indep long sitting in bed; indep with pericare sitting on toilet     Standing balance-Leahy Scale: Fair Standing balance comment: Able to static stand with no UE support                             Pertinent Vitals/Pain Pain Assessment: Faces Faces Pain Scale: Hurts a little bit Pain Location: R lower back and abdomen Pain Descriptors / Indicators: Grimacing;Discomfort Pain Intervention(s): Monitored during session    Home Living Family/patient expects to be discharged to:: Assisted living               Home Equipment: Gilford Rile - 4 wheels Additional Comments: Lives in [place] apartment at [place].     Prior Function Level of Independence: Independent with assistive device(s)         Comments: Mod indep with amb using rollator. Meals and cleaning services provided. Indep with bathing, dressing, pericare. Was participating in [facility] HHPT and HHOT     Hand Dominance        Extremity/Trunk Assessment   Upper Extremity  Assessment Upper Extremity Assessment: Generalized weakness    Lower Extremity Assessment Lower Extremity Assessment: Generalized weakness       Communication   Communication: No difficulties  Cognition Arousal/Alertness: Awake/alert Behavior During Therapy: WFL for tasks assessed/performed Overall Cognitive Status:  Within Functional Limits for tasks assessed                                        General Comments General comments (skin integrity, edema, etc.): Daughter Mardene Celeste) present during session    Exercises     Assessment/Plan    PT Assessment Patient needs continued PT services  PT Problem List Decreased strength;Decreased activity tolerance;Decreased balance;Decreased mobility       PT Treatment Interventions DME instruction;Gait training;Functional mobility training;Therapeutic activities;Therapeutic exercise;Balance training;Patient/family education    PT Goals (Current goals can be found in the Care Plan section)  Acute Rehab PT Goals Patient Stated Goal: Return home to ALF PT Goal Formulation: With patient Time For Goal Achievement: 09/07/17 Potential to Achieve Goals: Good    Frequency Min 3X/week   Barriers to discharge        Co-evaluation               AM-PAC PT "6 Clicks" Daily Activity  Outcome Measure Difficulty turning over in bed (including adjusting bedclothes, sheets and blankets)?: None Difficulty moving from lying on back to sitting on the side of the bed? : None Difficulty sitting down on and standing up from a chair with arms (e.g., wheelchair, bedside commode, etc,.)?: None Help needed moving to and from a bed to chair (including a wheelchair)?: A Little Help needed walking in hospital room?: A Little Help needed climbing 3-5 steps with a railing? : A Little 6 Click Score: 21    End of Session Equipment Utilized During Treatment: Gait belt Activity Tolerance: Patient tolerated treatment well Patient left: in bed;with call bell/phone within reach;Other (comment);with family/visitor present(with transport) Nurse Communication: Mobility status PT Visit Diagnosis: Other abnormalities of gait and mobility (R26.89);Muscle weakness (generalized) (M62.81)    Time: 4259-5638 PT Time Calculation (min) (ACUTE ONLY): 18  min   Charges:   PT Evaluation $PT Eval Low Complexity: 1 Low     PT G Codes:   PT G-Codes **NOT FOR INPATIENT CLASS** Functional Assessment Tool Used: AM-PAC 6 Clicks Basic Mobility Functional Limitation: Mobility: Walking and moving around Mobility: Walking and Moving Around Current Status (V5643): At least 20 percent but less than 40 percent impaired, limited or restricted Mobility: Walking and Moving Around Goal Status [phone]): At least 1 percent but less than 20 percent impaired, limited or restricted   Mabeline Caras, PT, DPT Acute Rehab Services  Pager: [place] 08/24/2017, 10:45 AM

## 2017-08-24 NOTE — Progress Notes (Signed)
OT Cancellation Note  Patient Details Name: Angel Meyer MRN: [id number] DOB: 22-Feb-1933   Cancelled Treatment:    Reason Eval/Treat Not Completed: Patient at procedure or test/ unavailable. Pt at vascular lab. Will check back as able for OT evaluation.   Norman Herrlich, MS OTR/L  Pager: [place] A Ralene Gasparyan 08/24/2017, 8:29 AM

## 2017-08-24 NOTE — Progress Notes (Signed)
PT Cancellation Note  Patient Details Name: Angel Meyer MRN: [id number] DOB: 1933/02/01   Cancelled Treatment:    Reason Eval/Treat Not Completed: Patient at procedure or test/unavailable - taken off floor with vascular. Will follow-up for PT evaluation as time allows.  Mabeline Caras, PT, DPT Acute Rehab Services  Pager: [place] 08/24/2017, 8:21 AM

## 2017-08-24 NOTE — Consult Note (Addendum)
Reason for Consult:gallstones Referring Physician: Dr. Aline August  Angel Meyer is an 83 y.o. female.  HPI: We have been asked to see this 83 year old female to determine whether she may have symptomatic cholelithiasis or cholecystitis.  She was admitted yesterday with an exacerbation of CHF.  She has a significant past medical history which includes chronic atrial fibrillation on Coumadin, CHF, chronic back pain from compression fractures, and chronic pain.  She reports worsening left-sided abdominal pain.  She reports that she feels like there is a mass present.  The pain is sharp, moderate to severe, and will come across her entire upper abdomen including the right upper quadrant.  She has had some nausea no vomiting.  She has a difficult time relating it to food that she eats.  During her workup in the emergency department, she had a CAT scan of the abdomen and pelvis as well as a right upper quadrant ultrasound.  The CT scan showed cholelithiasis and a small right inguinal hernia containing the cecum.  No other intra-abdominal pathology or mass was identified.  The ultrasound showed gallstones with mild gallbladder distention and a possible Murphy sign but no other evidence of cholecystitis.  Past Medical History:  Diagnosis Date  . Arthritis   . Diastolic dysfunction    preserved EF,  CHF In setting of afib previously  . History of blood transfusion   . Hyperlipidemia   . Hypertension   . Incontinence of urine   . OSA (obstructive sleep apnea)    does not use CPAP  . Osteoporosis   . Persistent atrial fibrillation (HCC)    failed on Flecainide, Tikosyn, and amiodarone  . Renal calculi   . Restless legs   . Rheumatic fever    age 19  . Systolic heart failure (HCC)    EF 30 to 35% per echo 08/2013,  subsequently has normalized    Past Surgical History:  Procedure Laterality Date  . ABDOMINAL HYSTERECTOMY    . BREAST SURGERY     benign biopsy  . CARDIOVERSION N/A 09/10/2013    Procedure: CARDIOVERSION;  Surgeon: Thompson Grayer, MD;  Location: [facility];  Service: Cardiovascular;  Laterality: N/A;  . CARDIOVERSION N/A 11/16/2013   Procedure: CARDIOVERSION;  Surgeon: Sueanne Margarita, MD;  Location: [facility] ENDOSCOPY;  Service: Cardiovascular;  Laterality: N/A;  . CARDIOVERSION N/A 08/18/2015   Procedure: CARDIOVERSION;  Surgeon: Sueanne Margarita, MD;  Location: [facility] ENDOSCOPY;  Service: Cardiovascular;  Laterality: N/A;  . CARDIOVERSION N/A 03/07/2017   Procedure: CARDIOVERSION;  Surgeon: Dorothy Spark, MD;  Location: [facility] ENDOSCOPY;  Service: Cardiovascular;  Laterality: N/A;  . CARDIOVERSION N/A 05/13/2017   Procedure: CARDIOVERSION;  Surgeon: Lelon Perla, MD;  Location: [facility] ENDOSCOPY;  Service: Cardiovascular;  Laterality: N/A;  . IR RADIOLOGIST EVAL & MGMT  02/04/2017  . IR VERTEBROPLASTY LUMBAR BX INC UNI/BIL INC/INJECT/IMAGING  02/10/2017  . [place] removed  . REPLACEMENT TOTAL KNEE  2005  . SPINE SURGERY     laminectomy 1965, spinal fusion with rod 2005  . TEE WITHOUT CARDIOVERSION N/A 03/07/2017   Procedure: TRANSESOPHAGEAL ECHOCARDIOGRAM (TEE);  Surgeon: Dorothy Spark, MD;  Location: [facility] ENDOSCOPY;  Service: Cardiovascular;  Laterality: N/A;  . TEE WITHOUT CARDIOVERSION N/A 05/13/2017   Procedure: TRANSESOPHAGEAL ECHOCARDIOGRAM (TEE);  Surgeon: Lelon Perla, MD;  Location: [facility] ENDOSCOPY;  Service: Cardiovascular;  Laterality: N/A;  . TONSILLECTOMY      Family History  Problem Relation Age of Onset  .  Cancer Mother        breast  . Diabetes Mother   . Cancer Paternal Grandfather     Social History:  reports that  has never smoked. she has never used smokeless tobacco. She reports that she does not drink alcohol or use drugs.  Allergies:  Allergies  Allergen Reactions  . Ciprofloxacin Nausea And Vomiting  . Demerol [Meperidine] Nausea And Vomiting  . Sulfa Antibiotics Nausea And Vomiting and Other (See Comments)    And all  derivatives -  GI Bleeding (?)  . Sulfacetamide Sodium Nausea And Vomiting and Other (See Comments)    GI bleeding, also (?)    Medications: I have reviewed the patient's current medications.  Results for orders placed or performed during the hospital encounter of 08/23/17 (from the past 48 hour(s))  CBC with Differential     Status: None   Collection Time: 08/23/17  1:50 PM  Result Value Ref Range   WBC 5.7 4.0 - 10.5 K/uL   RBC 4.37 3.87 - 5.11 MIL/uL   Hemoglobin 14.4 12.0 - 15.0 g/dL   HCT 42.7 36.0 - 46.0 %   MCV 97.7 78.0 - 100.0 fL   MCH 33.0 26.0 - 34.0 pg   MCHC 33.7 30.0 - 36.0 g/dL   RDW 15.1 11.5 - 15.5 %   Platelets 206 150 - 400 K/uL   Neutrophils Relative % 70 %   Neutro Abs 4.0 1.7 - 7.7 K/uL   Lymphocytes Relative 16 %   Lymphs Abs 0.9 0.7 - 4.0 K/uL   Monocytes Relative 13 %   Monocytes Absolute 0.7 0.1 - 1.0 K/uL   Eosinophils Relative 1 %   Eosinophils Absolute 0.1 0.0 - 0.7 K/uL   Basophils Relative 0 %   Basophils Absolute 0.0 0.0 - 0.1 K/uL  Comprehensive metabolic panel     Status: Abnormal   Collection Time: 08/23/17  1:50 PM  Result Value Ref Range   Sodium 133 (L) 135 - 145 mmol/L   Potassium 5.5 (H) 3.5 - 5.1 mmol/L   Chloride 99 (L) 101 - 111 mmol/L   CO2 23 22 - 32 mmol/L   Glucose, Bld 96 65 - 99 mg/dL   BUN 19 6 - 20 mg/dL   Creatinine, Ser 1.17 (H) 0.44 - 1.00 mg/dL   Calcium 9.3 8.9 - 10.3 mg/dL   Total Protein 6.8 6.5 - 8.1 g/dL   Albumin 4.0 3.5 - 5.0 g/dL   AST 52 (H) 15 - 41 U/L   ALT 16 14 - 54 U/L   Alkaline Phosphatase 122 38 - 126 U/L   Total Bilirubin 2.1 (H) 0.3 - 1.2 mg/dL   GFR calc non Af Amer 42 (L) >60 mL/min   GFR calc Af Amer 49 (L) >60 mL/min    Comment: (NOTE) The eGFR has been calculated using the CKD EPI equation. This calculation has not been validated in all clinical situations. eGFR's persistently <60 mL/min signify possible Chronic Kidney Disease.    Anion gap 11 5 - 15  Lipase, blood     Status: None    Collection Time: 08/23/17  1:50 PM  Result Value Ref Range   Lipase 22 11 - 51 U/L  Protime-INR     Status: Abnormal   Collection Time: 08/23/17  1:50 PM  Result Value Ref Range   Prothrombin Time 22.9 (H) 11.4 - 15.2 seconds   INR 2.04   I-stat troponin, ED     Status: None  Collection Time: 08/23/17  2:06 PM  Result Value Ref Range   Troponin i, poc 0.00 0.00 - 0.08 ng/mL   Comment 3            Comment: Due to the release kinetics of cTnI, a negative result within the first hours of the onset of symptoms does not rule out myocardial infarction with certainty. If myocardial infarction is still suspected, repeat the test at appropriate intervals.   Brain natriuretic peptide     Status: Abnormal   Collection Time: 08/23/17  4:03 PM  Result Value Ref Range   B Natriuretic Peptide 136.9 (H) 0.0 - 100.0 pg/mL  Potassium     Status: None   Collection Time: 08/23/17  4:04 PM  Result Value Ref Range   Potassium 3.9 3.5 - 5.1 mmol/L    Comment: DELTA CHECK NOTED  MRSA PCR Screening     Status: None   Collection Time: 08/23/17  9:13 PM  Result Value Ref Range   MRSA by PCR NEGATIVE NEGATIVE    Comment:        The GeneXpert MRSA Assay (FDA approved for NASAL specimens only), is one component of a comprehensive MRSA colonization surveillance program. It is not intended to diagnose MRSA infection nor to guide or monitor treatment for MRSA infections.     Dg Chest 2 View  Result Date: 08/22/2017 CLINICAL DATA:  Dyspnea EXAM: CHEST  2 VIEW COMPARISON:  08/13/2017 FINDINGS: Chronic lung disease with prominent lung markings. Left lower lobe airspace disease unchanged. Negative for heart failure or pneumonia. No significant pleural effusion. Mild cardiac enlargement Right shoulder replacement.  Lumbar fusion hardware. IMPRESSION: Chronic lung disease with scarring and left lower lobe atelectasis, stable. No interval change. Electronically Signed   By: Franchot Gallo M.D.   On:  08/22/2017 15:06   Ct Abdomen Pelvis W Contrast  Result Date: 08/23/2017 CLINICAL DATA:  Left-sided abdominal pain and bilateral lower extremity swelling. EXAM: CT ABDOMEN AND PELVIS WITH CONTRAST TECHNIQUE: Multidetector CT imaging of the abdomen and pelvis was performed using the standard protocol following bolus administration of intravenous contrast. CONTRAST:  <See Chart> ISOVUE-300 IOPAMIDOL (ISOVUE-300) INJECTION 61% COMPARISON:  None. FINDINGS: Lower chest: Stable area of rounded atelectasis at the left lung base when compared to prior chest CT scan from 11/17 218. The heart is mildly enlarged and coronary artery calcifications are noted. Hepatobiliary: No focal hepatic lesions or intrahepatic biliary dilatation. Gallbladder is mildly distended. A small calcified gallstone is noted. No CT findings to suggest acute cholecystitis. Pancreas: No mass, inflammation or ductal dilatation. Spleen: Normal size.  Small calcified granulomas. Adrenals/Urinary Tract: The adrenal glands are unremarkable. No renal lesions or hydronephrosis. The delayed images do not demonstrate any significant collecting system abnormality cyst. No worrisome renal lesions. Stomach/Bowel: The stomach, duodenum, small bowel and colon are grossly normal. No acute inflammatory changes, mass lesions or obstructive findings. Diffuse colonic diverticulosis without definite findings for acute diverticulitis. The terminal ileum is normal. Vascular/Lymphatic: Advanced atherosclerotic calcifications involving aorta and iliac arteries. No aneurysm or dissection. The branch vessels are patent. The major venous structures are patent. The circumaortic left renal vein is noted. No mesenteric or retroperitoneal mass or adenopathy. Reproductive: Surgically absent. Other: There is a right inguinal hernia containing the cecum just distal to the ileocecal valve. No findings for obstruction or incarceration but could be a source of pain. Musculoskeletal:  No significant bony findings. There are vertebral augmentation changes noted at L3 and there is spinal stabilization  hardware at L1-2. Remote appearing L1 fracture. IMPRESSION: 1. Cholelithiasis without CT findings for acute cholecystitis. 2. Right inguinal hernia containing the cecum. No obvious obstruction/incarceration. 3. Diffuse colonic diverticulosis without findings for acute diverticulitis. 4. Advanced vascular disease. Electronically Signed   By: Marijo Sanes M.D.   On: 08/23/2017 16:06   Dg Chest Port 1 View  Result Date: 08/23/2017 CLINICAL DATA:  Bibasilar crackers. Exertional dyspnea. Chronic shortness of breath. EXAM: PORTABLE CHEST 1 VIEW COMPARISON:  08/22/2017 FINDINGS: The cardiac silhouette is enlarged. Mediastinal contours appear intact. There is no evidence of focal airspace consolidation, pleural effusion or pneumothorax. Chronic linear peribronchial densities in the left lung base. Osseous structures are without acute abnormality. Soft tissues are grossly normal. IMPRESSION: Persistent linear peribronchial airspace opacities in the left lung base. This may represent atelectasis, scarring or peribronchial airspace consolidation. Electronically Signed   By: Fidela Salisbury M.D.   On: 08/23/2017 13:31   US Abdomen Limited Ruq  Result Date: 08/23/2017 CLINICAL DATA:  83 year old with right upper quadrant pain. EXAM: ULTRASOUND ABDOMEN LIMITED RIGHT UPPER QUADRANT COMPARISON:  08/23/2017 FINDINGS: Gallbladder: Mild distention of the gallbladder. Gallbladder wall measures up to 0.2 cm. Echogenic foci in the gallbladder are suggestive for stones. Reportedly, the patient does have a sonographic Murphy sign. No evidence for pericholecystic fluid. Common bile duct: Diameter: 0.3 cm. Liver: Liver is not well visualized on this examination. Liver is mildly heterogeneous. No significant biliary dilatation. Portal vein is patent on color Doppler imaging with normal direction of blood flow  towards the liver. IMPRESSION: Cholelithiasis with mild gallbladder distension and a Murphy sign. Findings are concerning for acute cholecystitis. No biliary dilatation. Electronically Signed   By: Markus Daft M.D.   On: 08/23/2017 17:50    Review of Systems  Constitutional: Negative for chills and fever.  Respiratory: Negative for shortness of breath.   Cardiovascular: Positive for leg swelling.  Gastrointestinal: Positive for abdominal pain and nausea. Negative for diarrhea and vomiting.  Genitourinary: Positive for flank pain.  Musculoskeletal: Positive for back pain.  Neurological: Positive for weakness.  All other systems reviewed and are negative.  Blood pressure 103/88, pulse 92, temperature 97.7 F (36.5 C), temperature source Oral, resp. rate 18, height 5' 1"  (1.549 m), weight 88.2 kg (194 lb 7.1 oz), SpO2 98 %. Physical Exam  Constitutional: She is oriented to person, place, and time. She appears well-developed. No distress.  Elderly appearing female sitting up in a bed.  She appears comfortable  HENT:  Head: Normocephalic and atraumatic.  Right Ear: External ear normal.  Left Ear: External ear normal.  Nose: Nose normal.  Eyes: Pupils are equal, round, and reactive to light. Right eye exhibits no discharge. Left eye exhibits no discharge. No scleral icterus.  Neck: Normal range of motion. No tracheal deviation present.  Cardiovascular: Normal rate and normal heart sounds.  Irregular rhythm  Respiratory: Effort normal and breath sounds normal. No respiratory distress. She has no wheezes.  GI: Soft. She exhibits no distension. There is no tenderness. There is no rebound and no guarding.  I could not appreciate tenderness on physical examination of her abdomen specifically in the right upper quadrant.  There is an easily reducible small right inguinal hernia which is nontender  Musculoskeletal: Normal range of motion. She exhibits edema.  Lymphadenopathy:    She has no cervical  adenopathy.  Neurological: She is alert and oriented to person, place, and time.  Skin: Skin is warm and dry. She is not diaphoretic.  Psychiatric: Her behavior is normal. Judgment normal.  She developed severe pain across her back and abdomen when she was laid flat  Assessment/Plan:  Cholelithiasis with possible cholecystitis Asymptomatic small right inguinal hernia  Given her multiple comorbidities, difficulty and pain assessment given her musculoskeletal issues, need for chronic anticoagulation, and elevated liver function tests, I cannot entirely exclude symptomatic cholelithiasis or cholecystitis.  I discussed this with the patient and her family.  I will order a HIDA scan to see if there is any evidence of cholecystitis or biliary obstruction.  Her bilirubin was elevated yesterday and this morning labs are pending.  I discussed the reason for the further workup with the patient and family and they agree with the plans. Regarding the right inguinal hernia, I believe this is asymptomatic and we can address this as an outpatient at a later date.  I do not believe it is causing any of her upper abdominal pain symptoms  Weyman Bogdon A 08/24/2017, 6:03 AM

## 2017-08-24 NOTE — Progress Notes (Addendum)
1814 - Current Right forearm PiV has infiltrated.  PiV removed and hot pack in place.  After RN x2 assessment, IV consult has been placed. ------------------------------------------------------------------------- 1832 - IV Team came to room and pt refused attempt for new PiV, and wants to wait until daughter returns before she will allow a new IV to be placed.  Report will be given to PM nurse to place another IV consult order when daughter returns.

## 2017-08-24 NOTE — Progress Notes (Signed)
Patient ID: Angel Meyer, female   DOB: 02-16-1933, 83 y.o.   MRN: [id number] hida negative.  Pain is left sided per patient and has had episodes of abdominal pain and back pain. Not sure this is gallbladder though. Would not recommend surgery at this point as I think has asymptomatic stones.

## 2017-08-24 NOTE — Plan of Care (Signed)
  Progressing Education: Knowledge of General Education information will improve 08/24/2017 0810 - Progressing by Imagene Gurney, RN Clinical Measurements: Will remain free from infection 08/24/2017 0810 - Progressing by Imagene Gurney, RN

## 2017-08-24 NOTE — Progress Notes (Signed)
Per Pt bath has been done. Daughter assisted with bath.

## 2017-08-24 NOTE — Consult Note (Signed)
Cardiology Consultation:   Patient ID: Angel Meyer; [id number]; [birth year]   Admit date: 08/23/2017 Date of Consult: 08/24/2017  Primary Care Provider: [name], MD Primary Electrophysiologist:  Dr. Rayann Meyer  Patient Profile:   Angel Meyer is a 83 y.o. female with a hx of persistent AFib historically has been difficult to control rate, there has been discussion she may end up needing pacer/AVN ablation, CHF (diastolic), CBP, HTN, HLD, OSA, significant arthritis who is being seen today for the evaluation of her AFib, fluid OL at the request of Dr. Bonner Meyer.  History of Present Illness:   Angel Meyer in the last week particularly has been having waxing/waning, almost colicky sounding abdominal pain, somewhat generalized, though perhaps more upper abdomen with some exacerbations, she has felt bloated and her daughter mentions noted to be more swollen.  She saw D. Kayleen Memos, NP at the AF clinic who increased her lasix and decreased her diltiazem with thoughts this could be contributing to edema.  She was brought to the ER the following day with increasing frequency of her abdominal pain.    The patient has some degree of dementia/cognitive impairment and her daughter provides much of her PMHx and HPI.  There is no reports of CP, no N/V/D, there is mention of some SOB.  No near syncope or syncope.  Her abdominal complaints are much better currently, at rest does not feel SOB.  Surgery service was brought on board with some concern for gallbladder etiology, she is also being treated for fluid OL with IV lasix.  LABS: K+ 4.1 BUN/Creat 18/1.16 LFTs bilirubins are wnl today (slightly elevated yesterday) WBC 5.3 H/H 13/40 plts 182 INR 2.02   Past Medical History:  Diagnosis Date  . Arthritis   . Diastolic dysfunction    preserved EF,  CHF In setting of afib previously  . History of blood transfusion   . Hyperlipidemia   . Hypertension   . Incontinence of urine   . OSA (obstructive  sleep apnea)    does not use CPAP  . Osteoporosis   . Persistent atrial fibrillation (HCC)    failed on Flecainide, Tikosyn, and amiodarone  . Renal calculi   . Restless legs   . Rheumatic fever    age 65  . Systolic heart failure (HCC)    EF 30 to 35% per echo 08/2013,  subsequently has normalized    Past Surgical History:  Procedure Laterality Date  . ABDOMINAL HYSTERECTOMY    . BREAST SURGERY     benign biopsy  . CARDIOVERSION N/A 09/10/2013   Procedure: CARDIOVERSION;  Surgeon: Thompson Grayer, MD;  Location: [place];  Service: Cardiovascular;  Laterality: N/A;  . CARDIOVERSION N/A 11/16/2013   Procedure: CARDIOVERSION;  Surgeon: Sueanne Margarita, MD;  Location: [facility] ENDOSCOPY;  Service: Cardiovascular;  Laterality: N/A;  . CARDIOVERSION N/A 08/18/2015   Procedure: CARDIOVERSION;  Surgeon: Sueanne Margarita, MD;  Location: [facility] ENDOSCOPY;  Service: Cardiovascular;  Laterality: N/A;  . CARDIOVERSION N/A 03/07/2017   Procedure: CARDIOVERSION;  Surgeon: Dorothy Spark, MD;  Location: [facility] ENDOSCOPY;  Service: Cardiovascular;  Laterality: N/A;  . CARDIOVERSION N/A 05/13/2017   Procedure: CARDIOVERSION;  Surgeon: Lelon Perla, MD;  Location: [facility] ENDOSCOPY;  Service: Cardiovascular;  Laterality: N/A;  . IR RADIOLOGIST EVAL & MGMT  02/04/2017  . IR VERTEBROPLASTY LUMBAR BX INC UNI/BIL INC/INJECT/IMAGING  02/10/2017  . [employer] removed  . REPLACEMENT TOTAL KNEE  2005  . SPINE SURGERY  laminectomy 1965, spinal fusion with rod 2005  . TEE WITHOUT CARDIOVERSION N/A 03/07/2017   Procedure: TRANSESOPHAGEAL ECHOCARDIOGRAM (TEE);  Surgeon: Dorothy Spark, MD;  Location: [facility] ENDOSCOPY;  Service: Cardiovascular;  Laterality: N/A;  . TEE WITHOUT CARDIOVERSION N/A 05/13/2017   Procedure: TRANSESOPHAGEAL ECHOCARDIOGRAM (TEE);  Surgeon: Lelon Perla, MD;  Location: [facility] ENDOSCOPY;  Service: Cardiovascular;  Laterality: N/A;  . TONSILLECTOMY         Inpatient  Medications: Scheduled Meds: . diltiazem  240 mg Oral BID  . docusate sodium  100 mg Oral BID  . donepezil  10 mg Oral QHS  . escitalopram  10 mg Oral Daily  . furosemide  60 mg Intravenous BID  . gabapentin  100 mg Oral QHS  . Melatonin  6 mg Oral QHS  . memantine  10 mg Oral BID  . multivitamin  1 tablet Oral Daily  . oxybutynin  10 mg Oral Daily  . rOPINIRole  1 mg Oral BID  . rosuvastatin  40 mg Oral QHS  . spironolactone  25 mg Oral Daily  . warfarin  2.5 mg Oral ONCE-1800  . Warfarin - Pharmacist Dosing Inpatient   Does not apply q1800   Continuous Infusions:  PRN Meds: acetaminophen **OR** acetaminophen, bisacodyl, diazepam, HYDROcodone-acetaminophen, ketorolac, nitroGLYCERIN, ondansetron **OR** ondansetron (ZOFRAN) IV, polyethylene glycol  Allergies:    Allergies  Allergen Reactions  . Ciprofloxacin Nausea And Vomiting  . Demerol [Meperidine] Nausea And Vomiting  . Sulfa Antibiotics Nausea And Vomiting and Other (See Comments)    And all derivatives -  GI Bleeding (?)  . Sulfacetamide Sodium Nausea And Vomiting and Other (See Comments)    GI bleeding, also (?)    Social History:   Social History   Socioeconomic History  . Marital status: Divorced    Spouse name: Not on file  . Number of children: Not on file  . Years of education: Not on file  . Highest education level: Not on file  Social Needs  . Financial resource strain: Not on file  . Food insecurity - worry: Not on file  . Food insecurity - inability: Not on file  . Transportation needs - medical: Not on file  . Transportation needs - non-medical: Not on file  Occupational History  . Not on file  Tobacco Use  . Smoking status: Never Smoker  . Smokeless tobacco: Never Used  Substance and Sexual Activity  . Alcohol use: No  . Drug use: No  . Sexual activity: Not Currently  Other Topics Concern  . Not on file  Social History Narrative   Lives in [place] alone.  Spends 4-6 months per year in  [place] with her daughter.   Retired Network engineer    Family History:    Family History  Problem Relation Age of Onset  . Cancer Mother        breast  . Diabetes Mother   . Cancer Paternal Grandfather      ROS:  Please see the history of present illness.  ROS  All other ROS reviewed and negative.     Physical Exam/Data:   Vitals:   08/23/17 2106 08/24/17 0520 08/24/17 0800 08/24/17 1230  BP: 140/82 103/88 102/72 (!) 128/104  Pulse: 86 92 84 69  Resp: 17 18 18 18   Temp:  97.7 F (36.5 C) 97.9 F (36.6 C) 98.1 F (36.7 C)  TempSrc:  Oral Oral Oral  SpO2: 97% 98% 94% 95%  Weight:  194 lb 7.1 oz (88.2  kg)    Height:        Intake/Output Summary (Last 24 hours) at 08/24/2017 1548 Last data filed at 08/24/2017 1400 Gross per 24 hour  Intake 775 ml  Output 425 ml  Net 350 ml   Filed Weights   08/23/17 1143 08/23/17 2010 08/24/17 0520  Weight: 206 lb (93.4 kg) 196 lb 12.8 oz (89.3 kg) 194 lb 7.1 oz (88.2 kg)   Body mass index is 36.74 kg/m.  General:  Well nourished, well developed, obese, elderly woman, in no acute distress, sitting in bedside chair HEENT: normal Lymph: no adenopathy Neck: 6 cm JVD (seated) Endocrine:  No thryomegaly Vascular: No carotid bruits  Cardiac: iRRR; soft SM, no gallops or rubs are note Lungs:  Diminished base, slight rales R base, no wheezing  Abd: soft, nontender, obese  Ext: non-pitting edema Musculoskeletal:  No deformities Skin: warm and dry  Neuro:  No gross focal abnormalities noted, described as forgetful Psych:  Normal affect   EKG:  The EKG was personally reviewed and demonstrates:   AFib, 98bpm, QRS 20ms Telemetry:  Telemetry was personally reviewed and demonstrates:   Afib, generally 80's-90's  Relevant CV Studies:  09/03/15: TTE Study Conclusions - Left ventricle: The cavity size was normal. Wall thickness was   normal. Systolic function was normal. The estimated ejection   fraction was in the range of 55% to  60%. Wall motion was normal;   there were no regional wall motion abnormalities. Doppler   parameters are consistent with abnormal left ventricular   relaxation (grade 1 diastolic dysfunction). - Mitral valve: Valve area by pressure half-time: 1.48 cm^2. Valve   area by continuity equation (using LVOT flow): 1.08 cm^2. - Pulmonary arteries: Systolic pressure was mildly increased. PA   peak pressure: 35 mm Hg (S).  Laboratory Data:  Chemistry Recent Labs  Lab 08/23/17 1350 08/23/17 1604 08/24/17 0526  NA 133*  --  136  K 5.5* 3.9 4.1  CL 99*  --  103  CO2 23  --  25  GLUCOSE 96  --  79  BUN 19  --  18  CREATININE 1.17*  --  1.16*  CALCIUM 9.3  --  9.2  GFRNONAA 42*  --  43*  GFRAA 49*  --  49*  ANIONGAP 11  --  8    Recent Labs  Lab 08/23/17 1350 08/24/17 0526  PROT 6.8 6.2*  ALBUMIN 4.0 3.4*  AST 52* 24  ALT 16 21  ALKPHOS 122 111  BILITOT 2.1* 1.0   Hematology Recent Labs  Lab 08/23/17 1350 08/24/17 0526  WBC 5.7 5.3  RBC 4.37 4.13  HGB 14.4 13.4  HCT 42.7 40.7  MCV 97.7 98.5  MCH 33.0 32.4  MCHC 33.7 32.9  RDW 15.1 15.3  PLT 206 182   Cardiac EnzymesNo results for input(s): TROPONINI in the last 168 hours.  Recent Labs  Lab 08/23/17 1406  TROPIPOC 0.00    BNP Recent Labs  Lab 08/23/17 1603  BNP 136.9*    DDimer No results for input(s): DDIMER in the last 168 hours.  Radiology/Studies:   Dg Chest 2 View Result Date: 08/22/2017 CLINICAL DATA:  Dyspnea EXAM: CHEST  2 VIEW COMPARISON:  08/13/2017 FINDINGS: Chronic lung disease with prominent lung markings. Left lower lobe airspace disease unchanged. Negative for heart failure or pneumonia. No significant pleural effusion. Mild cardiac enlargement Right shoulder replacement.  Lumbar fusion hardware. IMPRESSION: Chronic lung disease with scarring and left lower lobe atelectasis,  stable. No interval change. Electronically Signed   By: Franchot Gallo M.D.   On: 08/22/2017 15:06   Nm Hepatobiliary  Liver Func Result Date: 08/24/2017 CLINICAL DATA:  Severe left upper quadrant pain.  Cholelithiasis. EXAM: NUCLEAR MEDICINE HEPATOBILIARY IMAGING TECHNIQUE: Sequential images of the abdomen were obtained out to 60 minutes following intravenous administration of radiopharmaceutical. RADIOPHARMACEUTICALS:  5.3 mCi Tc-54m  Choletec IV COMPARISON:  None. FINDINGS: Prompt uptake and biliary excretion of activity by the liver is seen. Gallbladder activity is visualized, consistent with patency of cystic duct. Biliary activity passes into small bowel, consistent with patent common bile duct. IMPRESSION: Normal hepatobiliary imaging demonstrating patency of both cystic and common bile ducts. Electronically Signed   By: Earle Gell M.D.   On: 08/24/2017 14:43    Ct Abdomen Pelvis W Contrast Result Date: 08/23/2017 CLINICAL DATA:  Left-sided abdominal pain and bilateral lower extremity swelling. EXAM: CT ABDOMEN AND PELVIS WITH CONTRAST TECHNIQUE: Multidetector CT imaging of the abdomen and pelvis was performed using the standard protocol following bolus administration of intravenous contrast. CONTRAST:  <See Chart> ISOVUE-300 IOPAMIDOL (ISOVUE-300) INJECTION 61% COMPARISON:  None. FINDINGS: Lower chest: Stable area of rounded atelectasis at the left lung base when compared to prior chest CT scan from 11/17 218. The heart is mildly enlarged and coronary artery calcifications are noted. Hepatobiliary: No focal hepatic lesions or intrahepatic biliary dilatation. Gallbladder is mildly distended. A small calcified gallstone is noted. No CT findings to suggest acute cholecystitis. Pancreas: No mass, inflammation or ductal dilatation. Spleen: Normal size.  Small calcified granulomas. Adrenals/Urinary Tract: The adrenal glands are unremarkable. No renal lesions or hydronephrosis. The delayed images do not demonstrate any significant collecting system abnormality cyst. No worrisome renal lesions. Stomach/Bowel: The stomach,  duodenum, small bowel and colon are grossly normal. No acute inflammatory changes, mass lesions or obstructive findings. Diffuse colonic diverticulosis without definite findings for acute diverticulitis. The terminal ileum is normal. Vascular/Lymphatic: Advanced atherosclerotic calcifications involving aorta and iliac arteries. No aneurysm or dissection. The branch vessels are patent. The major venous structures are patent. The circumaortic left renal vein is noted. No mesenteric or retroperitoneal mass or adenopathy. Reproductive: Surgically absent. Other: There is a right inguinal hernia containing the cecum just distal to the ileocecal valve. No findings for obstruction or incarceration but could be a source of pain. Musculoskeletal: No significant bony findings. There are vertebral augmentation changes noted at L3 and there is spinal stabilization hardware at L1-2. Remote appearing L1 fracture. IMPRESSION: 1. Cholelithiasis without CT findings for acute cholecystitis. 2. Right inguinal hernia containing the cecum. No obvious obstruction/incarceration. 3. Diffuse colonic diverticulosis without findings for acute diverticulitis. 4. Advanced vascular disease. Electronically Signed   By: Marijo Sanes M.D.   On: 08/23/2017 16:06   Dg Chest Port 1 View Result Date: 08/23/2017 CLINICAL DATA:  Bibasilar crackers. Exertional dyspnea. Chronic shortness of breath. EXAM: PORTABLE CHEST 1 VIEW COMPARISON:  08/22/2017 FINDINGS: The cardiac silhouette is enlarged. Mediastinal contours appear intact. There is no evidence of focal airspace consolidation, pleural effusion or pneumothorax. Chronic linear peribronchial densities in the left lung base. Osseous structures are without acute abnormality. Soft tissues are grossly normal. IMPRESSION: Persistent linear peribronchial airspace opacities in the left lung base. This may represent atelectasis, scarring or peribronchial airspace consolidation. Electronically Signed   By:  Fidela Salisbury M.D.   On: 08/23/2017 13:31   US Abdomen Limited Ruq Result Date: 08/23/2017 CLINICAL DATA:  83 year old with right upper quadrant pain. EXAM: ULTRASOUND ABDOMEN  LIMITED RIGHT UPPER QUADRANT COMPARISON:  08/23/2017 FINDINGS: Gallbladder: Mild distention of the gallbladder. Gallbladder wall measures up to 0.2 cm. Echogenic foci in the gallbladder are suggestive for stones. Reportedly, the patient does have a sonographic Murphy sign. No evidence for pericholecystic fluid. Common bile duct: Diameter: 0.3 cm. Liver: Liver is not well visualized on this examination. Liver is mildly heterogeneous. No significant biliary dilatation. Portal vein is patent on color Doppler imaging with normal direction of blood flow towards the liver. IMPRESSION: Cholelithiasis with mild gallbladder distension and a Murphy sign. Findings are concerning for acute cholecystitis. No biliary dilatation. Electronically Signed   By: Markus Daft M.D.   On: 08/23/2017 17:50    Assessment and Plan:   1. Abdominal pain/discomfort     W/u is in progress, testing as above     Surgeon on board      2. Suspect diastolic failure     Agree with up-titration of lasix today, fluid + by I/O  3. Persistent/permanent AFib     CHA2DS2Vasc is 4, on warfarin (pharmacy on board)     No longer on amiodarone after failed DCCV earlier this year despite AAD     Rate appears fairly well controlled     Agree that CCB may contribute to edema, will make diltiazem daily and add low dose BB     Will update her echo while here     For questions or updates, please contact [place] Please consult [email or web address] for contact info under Cardiology/STEMI.   Signed, Baldwin Jamaica, PA-C  08/24/2017 3:48 PM  EP Attending  Patient seen and examined. Agree with above. The patient has been admitted with abdominal discomfort and evidence of volume overload. She has been treated with diuretic therapy. She does not have  symptomatic atrial fib and her rates are controlled. She has been taking very high dose calcium channel blockers. She notes that she has had worsening edema, abdominal distention and is feeling better with diuresis. At this point she is on a strategy of rate control. I would recommend we start her on low dose metoprolol and reduce her dose of cardizem. Continue IV lasix until her creatinine bumps.  Mikle Bosworth.D.

## 2017-08-25 ENCOUNTER — Inpatient Hospital Stay (HOSPITAL_COMMUNITY): Payer: Medicare Other

## 2017-08-25 DIAGNOSIS — I509 Heart failure, unspecified: Secondary | ICD-10-CM

## 2017-08-25 DIAGNOSIS — I340 Nonrheumatic mitral (valve) insufficiency: Secondary | ICD-10-CM

## 2017-08-25 LAB — COMPREHENSIVE METABOLIC PANEL
ALK PHOS: 101 U/L (ref 38–126)
ALT: 22 U/L (ref 14–54)
AST: 26 U/L (ref 15–41)
Albumin: 3.4 g/dL — ABNORMAL LOW (ref 3.5–5.0)
Anion gap: 10 (ref 5–15)
BILIRUBIN TOTAL: 0.8 mg/dL (ref 0.3–1.2)
BUN: 29 mg/dL — ABNORMAL HIGH (ref 6–20)
CALCIUM: 8.8 mg/dL — AB (ref 8.9–10.3)
CHLORIDE: 99 mmol/L — AB (ref 101–111)
CO2: 26 mmol/L (ref 22–32)
CREATININE: 1.46 mg/dL — AB (ref 0.44–1.00)
GFR, EST AFRICAN AMERICAN: 37 mL/min — AB (ref >60)
GFR, EST NON AFRICAN AMERICAN: 32 mL/min — AB (ref >60)
Glucose, Bld: 88 mg/dL (ref 65–99)
Potassium: 4.1 mmol/L (ref 3.5–5.1)
Sodium: 135 mmol/L (ref 135–145)
TOTAL PROTEIN: 5.9 g/dL — AB (ref 6.5–8.1)

## 2017-08-25 LAB — ECHOCARDIOGRAM COMPLETE
HEIGHTINCHES: 61 in
WEIGHTICAEL: 3149.93 [oz_av]

## 2017-08-25 LAB — PROTIME-INR
INR: 2.39
PROTHROMBIN TIME: 25.9 s — AB (ref 11.4–15.2)

## 2017-08-25 MED ORDER — WARFARIN SODIUM 2.5 MG PO TABS
2.5000 mg | ORAL_TABLET | Freq: Once | ORAL | Status: AC
  Administered 2017-08-25: 2.5 mg via ORAL
  Filled 2017-08-25: qty 1

## 2017-08-25 NOTE — Progress Notes (Signed)
PROGRESS NOTE  Angel Meyer  JXB:[id number] DOB: 03-14-1933 DOA: 08/23/2017 PCP: [name], MD   Brief Narrative: Angel Meyer is an 83 y.o. female with a history of chronic atrial fibrillation on coumadin, CHF with exacerbation, chronic back pain, hypertension, hyperlipidemia, obstructive sleep apnea, osteoarthritis, urinary incontinence, restless leg syndrome, renal stones and cognitive impairment presented with complaints of abdominal pain.  Patient states that she has been having left-sided abdominal pain for the last week, progressively getting worse, sharp, up to 7-8 out of 10 in intensity associated with some right flank pain and nausea but no vomiting or diarrhea.  The pain has gotten worse despite taking pain medications.  Patient also complains of worsening abdominal swelling along with swelling of her legs with some shortness of breath.  Patient was seen in A. fib clinic yesterday and was asked to take an extra dose of Lasix.  She did not notice much increase in her urination.  No fever, chest pain, dysuria, hematuria, loss of consciousness or seizures.  ED Course: Patient had CT scan of the abdomen which showed gallstones and right inguinal hernia without incarceration, with diffuse diverticulosis without diverticulitis.  She was given intravenous Lasix.  General surgery was called by the ED provider.  Hospitalist service was called to evaluate the patient  LE DVU was ruled out with U/S. HIDA scan was normal. Diuresis has not been very significant since admission, so IV lasix dose increased.   Assessment & Plan: Active Problems:   Hypertension   Hyperlipidemia   Atrial fibrillation (HCC)   Abdominal pain   Cholelithiasis   Acute on chronic diastolic CHF (congestive heart failure) (HCC)   CHF exacerbation (HCC)  Acute on chronic diastolic congestive heart failure: EDW thought to be ~189lbs, was 196lbs (possibly 206lbs by ED scale?) on admission. Echo on 05/13/2017 had shown EF  of 55-60% with mild to moderate mitral regurgitation - Continue lasix 80mg  IV q12h, home spironolactone - Daily weights, strict I/O, recheck Cr in AM (up this AM) - Cardiology/EP consulting.  - Echo pending  Abdominal pain: Uncertain etiology. Appears most consistent with MSK etiology, seen by Dr. Nelva Bush orthopedics. Negative HIDA, reducible hernia, no actual abdominal pain on exam. ?fibromyalgia. - Monitor closely, hopeful for improvement with diuresis (?gut edema) - Continue pain control and antiemetics - Pain and nausea symptom control per orders  Cholelithiasis: CT abdomen showed cholelithiasis without cholecystitis along with diffuse diverticulosis and right inguinal hernia. HIDA was normal, general surgery signed off.  - Can follow up as outpatient with general surgery.  Chronic atrial fibrillation on coumadin: Historically difficult to control rate, referred to AFib clinic.  - Currently rate controlled. Plan per cardiology is to titrate down CCB, start BB. - Continue coumadin, dosed as per pharmacy. Monitor INR.  Hypertension: - Continue cardizem, lasix, spironolactone.  Dyslipidemia: - Continue statin  Cognitive impairment: Stable per daughter - Continue memantine, lexapro and donepezil  Mild hyperkalemia: Resolved on recheck ?hemolysis.  - Monitor  Chronic kidney disease stage III: No evidence of AKI at admission.  - Monitor BMP, bumped with diuresis.   DVT prophylaxis: Coumadin Code Status: DNR Family Communication: Daughter at bedside Disposition Plan: Anticipate DC to ALF with home health as before once volume status improved.  Consultants:   Cardiology  General surgery  Procedures:   Echocardiogram ordered  Antimicrobials:  None   Subjective: Urine output picked up with augmented lasix dose, abdominal pain, currently described as left rib pain, not significantly improved.   Objective: Vitals:  08/24/17 2039 08/25/17 0108 08/25/17 0514  08/25/17 1142  BP: 125/74 112/67 108/60 109/64  Pulse: 84 77 73 86  Resp: 19 18 19 18   Temp: 98.1 F (36.7 C) 97.8 F (36.6 C) 97.6 F (36.4 C) (!) 97.2 F (36.2 C)  TempSrc: Oral Oral Oral Oral  SpO2: 96% 95% 93% 95%  Weight:   89.3 kg (196 lb 13.9 oz)   Height:        Intake/Output Summary (Last 24 hours) at 08/25/2017 1529 Last data filed at 08/25/2017 1034 Gross per 24 hour  Intake 535 ml  Output 500 ml  Net 35 ml   Filed Weights   08/23/17 2010 08/24/17 0520 08/25/17 0514  Weight: 89.3 kg (196 lb 12.8 oz) 88.2 kg (194 lb 7.1 oz) 89.3 kg (196 lb 13.9 oz)   Gen: Pleasant elderly female in no distress Pulm: Non-labored breathing room air. Bibasilar crackles without wheezing. CV: Regular rate and rhythm. No murmur, rub, or gallop. No JVD, Trace LE edema. GI: Abdomen soft, nontender, nondistended, negative Murphy's. +BS. No CVA tenderness.  Ext: Warm, no deformities Skin: No rashes, lesions ulcers Neuro: Alert and oriented. No focal neurological deficits. Psych: Judgement and insight appear normal. Mood & affect appropriate.   Data Reviewed: I have personally reviewed following labs and imaging studies  CBC: Recent Labs  Lab 08/23/17 1350 08/24/17 0526  WBC 5.7 5.3  NEUTROABS 4.0  --   HGB 14.4 13.4  HCT 42.7 40.7  MCV 97.7 98.5  PLT 206 161   Basic Metabolic Panel: Recent Labs  Lab 08/23/17 1350 08/23/17 1604 08/24/17 0526 08/25/17 0445  NA 133*  --  136 135  K 5.5* 3.9 4.1 4.1  CL 99*  --  103 99*  CO2 23  --  25 26  GLUCOSE 96  --  79 88  BUN 19  --  18 29*  CREATININE 1.17*  --  1.16* 1.46*  CALCIUM 9.3  --  9.2 8.8*   GFR: Estimated Creatinine Clearance: 30.2 mL/min (A) (by C-G formula based on SCr of 1.46 mg/dL (H)). Liver Function Tests: Recent Labs  Lab 08/23/17 1350 08/24/17 0526 08/25/17 0445  AST 52* 24 26  ALT 16 21 22   ALKPHOS 122 111 101  BILITOT 2.1* 1.0 0.8  PROT 6.8 6.2* 5.9*  ALBUMIN 4.0 3.4* 3.4*   Recent Labs  Lab  08/23/17 1350  LIPASE 22   No results for input(s): AMMONIA in the last 168 hours. Coagulation Profile: Recent Labs  Lab 08/23/17 1350 08/24/17 0526 08/25/17 0445  INR 2.04 2.02 2.39   Urine analysis:    Component Value Date/Time   COLORURINE YELLOW 08/13/2017 0400   APPEARANCEUR HAZY (A) 08/13/2017 0400   LABSPEC 1.043 (H) 08/13/2017 0400   PHURINE 6.0 08/13/2017 0400   GLUCOSEU NEGATIVE 08/13/2017 0400   HGBUR NEGATIVE 08/13/2017 0400   BILIRUBINUR NEGATIVE 08/13/2017 0400   BILIRUBINUR neg 08/03/2017 1153   KETONESUR 5 (A) 08/13/2017 0400   PROTEINUR NEGATIVE 08/13/2017 0400   UROBILINOGEN 0.2 08/03/2017 1153   NITRITE NEGATIVE 08/13/2017 0400   LEUKOCYTESUR NEGATIVE 08/13/2017 0400   Recent Results (from the past 240 hour(s))  MRSA PCR Screening     Status: None   Collection Time: 08/23/17  9:13 PM  Result Value Ref Range Status   MRSA by PCR NEGATIVE NEGATIVE Final    Comment:        The GeneXpert MRSA Assay (FDA approved for NASAL specimens only), is one component of  a comprehensive MRSA colonization surveillance program. It is not intended to diagnose MRSA infection nor to guide or monitor treatment for MRSA infections.       Radiology Studies: Nm Hepatobiliary Liver Func  Result Date: 08/24/2017 CLINICAL DATA:  Severe left upper quadrant pain.  Cholelithiasis. EXAM: NUCLEAR MEDICINE HEPATOBILIARY IMAGING TECHNIQUE: Sequential images of the abdomen were obtained out to 60 minutes following intravenous administration of radiopharmaceutical. RADIOPHARMACEUTICALS:  5.3 mCi Tc-66m  Choletec IV COMPARISON:  None. FINDINGS: Prompt uptake and biliary excretion of activity by the liver is seen. Gallbladder activity is visualized, consistent with patency of cystic duct. Biliary activity passes into small bowel, consistent with patent common bile duct. IMPRESSION: Normal hepatobiliary imaging demonstrating patency of both cystic and common bile ducts. Electronically  Signed   By: Earle Gell M.D.   On: 08/24/2017 14:43   Ct Abdomen Pelvis W Contrast  Result Date: 08/23/2017 CLINICAL DATA:  Left-sided abdominal pain and bilateral lower extremity swelling. EXAM: CT ABDOMEN AND PELVIS WITH CONTRAST TECHNIQUE: Multidetector CT imaging of the abdomen and pelvis was performed using the standard protocol following bolus administration of intravenous contrast. CONTRAST:  <See Chart> ISOVUE-300 IOPAMIDOL (ISOVUE-300) INJECTION 61% COMPARISON:  None. FINDINGS: Lower chest: Stable area of rounded atelectasis at the left lung base when compared to prior chest CT scan from 11/17 218. The heart is mildly enlarged and coronary artery calcifications are noted. Hepatobiliary: No focal hepatic lesions or intrahepatic biliary dilatation. Gallbladder is mildly distended. A small calcified gallstone is noted. No CT findings to suggest acute cholecystitis. Pancreas: No mass, inflammation or ductal dilatation. Spleen: Normal size.  Small calcified granulomas. Adrenals/Urinary Tract: The adrenal glands are unremarkable. No renal lesions or hydronephrosis. The delayed images do not demonstrate any significant collecting system abnormality cyst. No worrisome renal lesions. Stomach/Bowel: The stomach, duodenum, small bowel and colon are grossly normal. No acute inflammatory changes, mass lesions or obstructive findings. Diffuse colonic diverticulosis without definite findings for acute diverticulitis. The terminal ileum is normal. Vascular/Lymphatic: Advanced atherosclerotic calcifications involving aorta and iliac arteries. No aneurysm or dissection. The branch vessels are patent. The major venous structures are patent. The circumaortic left renal vein is noted. No mesenteric or retroperitoneal mass or adenopathy. Reproductive: Surgically absent. Other: There is a right inguinal hernia containing the cecum just distal to the ileocecal valve. No findings for obstruction or incarceration but could be  a source of pain. Musculoskeletal: No significant bony findings. There are vertebral augmentation changes noted at L3 and there is spinal stabilization hardware at L1-2. Remote appearing L1 fracture. IMPRESSION: 1. Cholelithiasis without CT findings for acute cholecystitis. 2. Right inguinal hernia containing the cecum. No obvious obstruction/incarceration. 3. Diffuse colonic diverticulosis without findings for acute diverticulitis. 4. Advanced vascular disease. Electronically Signed   By: Marijo Sanes M.D.   On: 08/23/2017 16:06   US Abdomen Limited Ruq  Result Date: 08/23/2017 CLINICAL DATA:  83 year old with right upper quadrant pain. EXAM: ULTRASOUND ABDOMEN LIMITED RIGHT UPPER QUADRANT COMPARISON:  08/23/2017 FINDINGS: Gallbladder: Mild distention of the gallbladder. Gallbladder wall measures up to 0.2 cm. Echogenic foci in the gallbladder are suggestive for stones. Reportedly, the patient does have a sonographic Murphy sign. No evidence for pericholecystic fluid. Common bile duct: Diameter: 0.3 cm. Liver: Liver is not well visualized on this examination. Liver is mildly heterogeneous. No significant biliary dilatation. Portal vein is patent on color Doppler imaging with normal direction of blood flow towards the liver. IMPRESSION: Cholelithiasis with mild gallbladder distension and a Percell Miller  sign. Findings are concerning for acute cholecystitis. No biliary dilatation. Electronically Signed   By: Markus Daft M.D.   On: 08/23/2017 17:50    Scheduled Meds: . diltiazem  240 mg Oral Daily  . docusate sodium  100 mg Oral BID  . donepezil  10 mg Oral QHS  . escitalopram  10 mg Oral Daily  . furosemide  80 mg Intravenous BID  . gabapentin  100 mg Oral QHS  . Melatonin  6 mg Oral QHS  . memantine  10 mg Oral BID  . metoprolol succinate  25 mg Oral Daily  . multivitamin  1 tablet Oral Daily  . oxybutynin  10 mg Oral Daily  . rOPINIRole  1 mg Oral BID  . rosuvastatin  40 mg Oral QHS  . spironolactone   25 mg Oral Daily  . Warfarin - Pharmacist Dosing Inpatient   Does not apply q1800   Continuous Infusions:   LOS: 1 day   Time spent: 25 minutes.  Vance Gather, MD [facility] Pager [phone]  If 7PM-7AM, please contact night-coverage [email or web address] Password [email or web address] 08/25/2017, 3:29 PM

## 2017-08-25 NOTE — Plan of Care (Signed)
  Not Progressing Pain Managment: General experience of comfort will improve 08/25/2017 0847 - Not Progressing by Imagene Gurney, RN

## 2017-08-25 NOTE — Progress Notes (Signed)
Pt is incontinent at times, and refuses to keep pure wick in place; therefore, unable to record accurate output.

## 2017-08-25 NOTE — Progress Notes (Signed)
Educated pt and daughter about Fall Prevention Plan and the importance of bed/chair alarms being implemented.  However, daughter has requested that we discontinue these alarms.

## 2017-08-25 NOTE — Progress Notes (Signed)
Progress Note  Patient Name: Angel Meyer Date of Encounter: 08/25/2017  Primary Cardiologist/Electrophysiologist: Dr. Rayann Heman   Subjective   Doesn't feel much improvement, still feels a little bloated and swollen, mild nausea this AM, no CP  Inpatient Medications    Scheduled Meds: . diltiazem  240 mg Oral Daily  . docusate sodium  100 mg Oral BID  . donepezil  10 mg Oral QHS  . escitalopram  10 mg Oral Daily  . furosemide  80 mg Intravenous BID  . gabapentin  100 mg Oral QHS  . Melatonin  6 mg Oral QHS  . memantine  10 mg Oral BID  . metoprolol succinate  25 mg Oral Daily  . multivitamin  1 tablet Oral Daily  . oxybutynin  10 mg Oral Daily  . rOPINIRole  1 mg Oral BID  . rosuvastatin  40 mg Oral QHS  . spironolactone  25 mg Oral Daily  . Warfarin - Pharmacist Dosing Inpatient   Does not apply q1800   Continuous Infusions:  PRN Meds: acetaminophen **OR** acetaminophen, bisacodyl, diazepam, HYDROcodone-acetaminophen, ketorolac, nitroGLYCERIN, ondansetron **OR** ondansetron (ZOFRAN) IV, polyethylene glycol   Vital Signs    Vitals:   08/24/17 1230 08/24/17 2039 08/25/17 0108 08/25/17 0514  BP: (!) 128/104 125/74 112/67 108/60  Pulse: 69 84 77 73  Resp: 18 19 18 19   Temp: 98.1 F (36.7 C) 98.1 F (36.7 C) 97.8 F (36.6 C) 97.6 F (36.4 C)  TempSrc: Oral Oral Oral Oral  SpO2: 95% 96% 95% 93%  Weight:    196 lb 13.9 oz (89.3 kg)  Height:        Intake/Output Summary (Last 24 hours) at 08/25/2017 1002 Last data filed at 08/25/2017 0912 Gross per 24 hour  Intake 710 ml  Output 925 ml  Net -215 ml   Filed Weights   08/23/17 2010 08/24/17 0520 08/25/17 0514  Weight: 196 lb 12.8 oz (89.3 kg) 194 lb 7.1 oz (88.2 kg) 196 lb 13.9 oz (89.3 kg)    Telemetry    AFib 70's-80's - Personally Reviewed  ECG    No new EKGs - Personally Reviewed  Physical Exam   GEN: No acute distress.   Neck: No JVD (seated in bedside chair) Cardiac: iRRR, soft SM, no rubs, or  gallops.  Respiratory: diminished at the bases with soft crackles b/l. GI: Soft, nontender, non-distended  MS: trace if any pitting edema; No deformity. Neuro:  Nonfocal  Psych: Normal affect   Labs    Chemistry Recent Labs  Lab 08/23/17 1350 08/23/17 1604 08/24/17 0526 08/25/17 0445  NA 133*  --  136 135  K 5.5* 3.9 4.1 4.1  CL 99*  --  103 99*  CO2 23  --  25 26  GLUCOSE 96  --  79 88  BUN 19  --  18 29*  CREATININE 1.17*  --  1.16* 1.46*  CALCIUM 9.3  --  9.2 8.8*  PROT 6.8  --  6.2* 5.9*  ALBUMIN 4.0  --  3.4* 3.4*  AST 52*  --  24 26  ALT 16  --  21 22  ALKPHOS 122  --  111 101  BILITOT 2.1*  --  1.0 0.8  GFRNONAA 42*  --  43* 32*  GFRAA 49*  --  49* 37*  ANIONGAP 11  --  8 10     Hematology Recent Labs  Lab 08/23/17 1350 08/24/17 0526  WBC 5.7 5.3  RBC 4.37 4.13  HGB  14.4 13.4  HCT 42.7 40.7  MCV 97.7 98.5  MCH 33.0 32.4  MCHC 33.7 32.9  RDW 15.1 15.3  PLT 206 182    Cardiac EnzymesNo results for input(s): TROPONINI in the last 168 hours.  Recent Labs  Lab 08/23/17 1406  TROPIPOC 0.00     BNP Recent Labs  Lab 08/23/17 1603  BNP 136.9*     DDimer No results for input(s): DDIMER in the last 168 hours.   Radiology    No new studies  Cardiac Studies   Echo has been ordered, pending  09/03/15: TTE Study Conclusions - Left ventricle: The cavity size was normal. Wall thickness was normal. Systolic function was normal. The estimated ejection fraction was in the range of 55% to 60%. Wall motion was normal; there were no regional wall motion abnormalities. Doppler parameters are consistent with abnormal left ventricular relaxation (grade 1 diastolic dysfunction). - Mitral valve: Valve area by pressure half-time: 1.48 cm^2. Valve area by continuity equation (using LVOT flow): 1.08 cm^2. - Pulmonary arteries: Systolic pressure was mildly increased. PA peak pressure: 35 mm Hg (S).    Patient Profile     83 y.o. female  hx of persistent AFib historically has been difficult to control rate, there has been discussion she may end up needing pacer/AVN ablation, CHF (diastolic), CBP, HTN, HLD, OSA, significant arthritis admitted with abdominal complaints and CHF exacerbation  Assessment & Plan    1. Abdominal pain/discomfort     surgery f/u note yesterday, not felt to be acute GB issue, no surgery planned     C/w medicine team      2. Likely diastolic failure     Updated echo is pending     cummulatively Fluid + 145, though yesterday's out put was better then the day prior     Weight down 10lbs vs no change from admission, patient had 2 weights recorded, likely no change so far     Creat up some, agree with up-titration of lasix yesterday, follow   3. Persistent/permanent AFib     CHA2DS2Vasc is 4, on warfarin (pharmacy on board)     INR today 2.39     No longer on amiodarone after failed DCCV earlier this year despite AAD     Rate control strategy going forward     appears remains fairly well controlled     Agree that CCB may contribute to edema,  diltiazem decreased to daily and add low dose BB added     [name] update her echo while here      For questions or updates, please contact [facility] Please consult [email or web address] for contact info under Cardiology/STEMI.      Signed, Baldwin Jamaica, PA-C  08/25/2017, 10:02 AM    I have seen and examined this patient with [name].  Agree with above, note added to reflect my findings.  On exam, iRRR, no murmurs, lungs clear.  Patient continues to complain of abdominal pain and bloating.  She feels that this is likely due to excess fluid.  She says that she is not urinating as well as she should be.  She did walk the halls without much in the way of shortness of breath.  Would plan to continue IV Lasix, potentially increasing her dose if her urine output does not pick up.   [name] M. [name] MD 08/25/2017 6:27 PM

## 2017-08-25 NOTE — Progress Notes (Signed)
ANTICOAGULATION CONSULT NOTE -Follow up  Pharmacy Consult for warfarin  Indication: atrial fibrillation  Allergies  Allergen Reactions  . Ciprofloxacin Nausea And Vomiting  . Demerol [Meperidine] Nausea And Vomiting  . Sulfa Antibiotics Nausea And Vomiting and Other (See Comments)    And all derivatives -  GI Bleeding (?)  . Sulfacetamide Sodium Nausea And Vomiting and Other (See Comments)    GI bleeding, also (?)    Patient Measurements: Height: 5\' 1"  (154.9 cm) Weight: 196 lb 13.9 oz (89.3 kg) IBW/kg (Calculated) : 47.8 kg   Vital Signs: Temp: 97.2 F (36.2 C) (11/29 1142) Temp Source: Oral (11/29 1142) BP: 109/64 (11/29 1142) Pulse Rate: 86 (11/29 1142)  Labs: Recent Labs    08/23/17 1350 08/24/17 0526 08/25/17 0445  HGB 14.4 13.4  --   HCT 42.7 40.7  --   PLT 206 182  --   LABPROT 22.9* 22.6* 25.9*  INR 2.04 2.02 2.39  CREATININE 1.17* 1.16* 1.46*    Estimated Creatinine Clearance: 30.2 mL/min (A) (by C-G formula based on SCr of 1.46 mg/dL (H)).   Medical History: Past Medical History:  Diagnosis Date  . Arthritis   . Diastolic dysfunction    preserved EF,  CHF In setting of afib previously  . History of blood transfusion   . Hyperlipidemia   . Hypertension   . Incontinence of urine   . OSA (obstructive sleep apnea)    does not use CPAP  . Osteoporosis   . Persistent atrial fibrillation (HCC)    failed on Flecainide, Tikosyn, and amiodarone  . Renal calculi   . Restless legs   . Rheumatic fever    age 56  . Systolic heart failure (HCC)    EF 30 to 35% per echo 08/2013,  subsequently has normalized    Medications:  Medications Prior to Admission  Medication Sig Dispense Refill Last Dose  . acetaminophen (TYLENOL) 500 MG tablet Take 500 mg 2 (two) times daily by mouth.   08/22/2017 at 1700  . diazepam (VALIUM) 2 MG tablet Take 1 tablet (2 mg total) at bedtime as needed by mouth for muscle spasms. 5 tablet 0 PRN at PRN  . diltiazem (CARDIZEM  CD) 240 MG 24 hr capsule Take 240 mg by mouth 2 (two) times daily.   08/23/2017 at 0800  . diltiazem (CARDIZEM) 30 MG tablet Take 30 mg by mouth every 6 (six) hours as needed (for A-FIB; HOLD & CALL MD FOR PULSE >120).    PRN at PRN  . docusate sodium (COLACE) 100 MG capsule Take 100 mg by mouth daily.    08/23/2017 at 0800  . donepezil (ARICEPT ODT) 10 MG disintegrating tablet Take 1 tablet (10 mg total) by mouth at bedtime. 90 tablet 2 08/22/2017 at 2000  . escitalopram (LEXAPRO) 10 MG tablet TAKE 1 TABLET EVERY DAY (Patient taking differently: Take 10 mg by mouth once a day) 90 tablet 1 08/23/2017 at 0800  . furosemide (LASIX) 40 MG tablet Take 1 tablet (40 mg total) by mouth daily. 90 tablet 3 08/23/2017 at 0800  . gabapentin (NEURONTIN) 100 MG capsule Take 100 mg by mouth at bedtime.   08/22/2017 at 2000  . HYDROcodone-acetaminophen (NORCO) 7.5-325 MG tablet Take 1 tablet every 6 (six) hours as needed by mouth for moderate pain. (Patient taking differently: Take 1 tablet by mouth See admin instructions. 1 tablet three times a day and may take an additional 2 doses as needed for nighttime pain between 2000 and  0800) 20 tablet 0 08/23/2017 at 0800  . Magnesium 500 MG TABS Take 500 mg by mouth at bedtime.   08/22/2017 at 2000  . Melatonin 5 MG TABS Take 5 mg at bedtime by mouth.    08/22/2017 at 2200  . memantine (NAMENDA) 10 MG tablet Take 10 mg 2 (two) times daily by mouth.   08/23/2017 at 0800  . Multiple Vitamins-Minerals (PRESERVISION AREDS) CAPS Take 1 capsule by mouth 2 (two) times daily.   08/23/2017 at 0800  . nitroGLYCERIN (NITROSTAT) 0.4 MG SL tablet Place 1 tablet (0.4 mg total) under the tongue every 5 (five) minutes as needed for chest pain. (Patient taking differently: Place 0.4 mg under the tongue every 5 (five) minutes x 3 doses as needed for chest pain. ) 25 tablet 3 PRN at PRN  . oxybutynin (DITROPAN-XL) 10 MG 24 hr tablet Take 10 mg by mouth at bedtime.   11 08/22/2017 at 2200  .  rOPINIRole (REQUIP) 1 MG tablet Take 1 tablet (1 mg total) by mouth See admin instructions. Take 1 tablet (1 mg) by mouth daily at 4pm and at bedtime (Patient taking differently: Take 1 mg by mouth 2 (two) times daily. ) 180 tablet 2 08/22/2017 at 2000  . rosuvastatin (CRESTOR) 40 MG tablet Take 1 tablet (40 mg total) by mouth at bedtime. 90 tablet 3 08/22/2017 at 2000  . spironolactone (ALDACTONE) 25 MG tablet Take 1 tablet (25 mg total) by mouth daily. 90 tablet 3 08/23/2017 at 0800  . triamcinolone cream (KENALOG) 0.1 % Apply 1 application topically See admin instructions. 1 application applied topically to both breasts two times a day as needed for irritation   PRN at PRN  . trimethoprim (TRIMPEX) 100 MG tablet Take 100 mg by mouth daily.   11 08/23/2017 at 0800  . trolamine salicylate (ASPERCREME) 10 % cream Apply 1 application every 12 (twelve) hours as needed topically for muscle pain.   PRN at PRN  . warfarin (COUMADIN) 2.5 MG tablet Take as directed by anticoagulation clinic. (Patient taking differently: Take 2.5 mg by mouth every evening. ) 105 tablet 1 08/22/2017 at 1700  . Misc. Devices (ROLLER [place]) MISC Used as Directed. 1 each 0 Unk at Unk   Scheduled:  . diltiazem  240 mg Oral Daily  . docusate sodium  100 mg Oral BID  . donepezil  10 mg Oral QHS  . escitalopram  10 mg Oral Daily  . furosemide  80 mg Intravenous BID  . gabapentin  100 mg Oral QHS  . Melatonin  6 mg Oral QHS  . memantine  10 mg Oral BID  . metoprolol succinate  25 mg Oral Daily  . multivitamin  1 tablet Oral Daily  . oxybutynin  10 mg Oral Daily  . rOPINIRole  1 mg Oral BID  . rosuvastatin  40 mg Oral QHS  . spironolactone  25 mg Oral Daily  . Warfarin - Pharmacist Dosing Inpatient   Does not apply q1800    Assessment: 83 yo female from NH admitted 08/23/17 with CHF exacerbation and worsening  abdominal pain . She is on coumadin PTA for h/o Afib, with therapeutic INR and pharmacy consulted to dose.  INR=  2.04>2.02>2.39, remains therapeutic on continued her usual pta dose. No bleeding noted. CBC wnl/stable, last done 11/28 SCr has increased to 1.46 today. - on Toradol 15mg  IV q6h prn severe pain. - can increase risk of bleeding.  Only 2 doses given on 11/28 AM so far.  Home coumadin dose: 2.5mg  po daily (last dose 11/26)  Goal of Therapy:  INR 2-3 Monitor platelets by anticoagulation protocol: Yes   Plan:  -Coumadin 2.5mg  po tonight -Daily PT/INR  Nicole Cella, RPh Clinical Pharmacist 8A-4P [phone] 4P-10P [phone] [place] [phone]  08/25/2017 11:58 AM

## 2017-08-25 NOTE — Progress Notes (Addendum)
Daughter was removing tape from previous lab stick from pt;s Right anterior forearm.  This action led to a skin tear, measuring appx 3.81 x 1.27cm.  Daughter called me to room to assess. This nurse cleaned wound with tap water, placed vaseline gauze, 4x4 dry gauze, and wrapped with kerlix.  Documentation can be found in [place].

## 2017-08-26 LAB — PROTIME-INR
INR: 2.26
PROTHROMBIN TIME: 24.7 s — AB (ref 11.4–15.2)

## 2017-08-26 LAB — BASIC METABOLIC PANEL
ANION GAP: 10 (ref 5–15)
BUN: 32 mg/dL — ABNORMAL HIGH (ref 6–20)
CALCIUM: 8.9 mg/dL (ref 8.9–10.3)
CHLORIDE: 97 mmol/L — AB (ref 101–111)
CO2: 27 mmol/L (ref 22–32)
Creatinine, Ser: 1.59 mg/dL — ABNORMAL HIGH (ref 0.44–1.00)
GFR calc non Af Amer: 29 mL/min — ABNORMAL LOW (ref >60)
GFR, EST AFRICAN AMERICAN: 34 mL/min — AB (ref >60)
Glucose, Bld: 92 mg/dL (ref 65–99)
Potassium: 3.8 mmol/L (ref 3.5–5.1)
Sodium: 134 mmol/L — ABNORMAL LOW (ref 135–145)

## 2017-08-26 LAB — MAGNESIUM: MAGNESIUM: 2.2 mg/dL (ref 1.7–2.4)

## 2017-08-26 MED ORDER — SPIRONOLACTONE 25 MG PO TABS: 25.0000 mg | ORAL_TABLET | Freq: Every day | ORAL | Status: DC

## 2017-08-26 MED ORDER — HYDROCODONE-ACETAMINOPHEN 7.5-325 MG PO TABS: 1.0000 | ORAL_TABLET | ORAL | 0 refills | Status: DC

## 2017-08-26 MED ORDER — METOPROLOL SUCCINATE ER 25 MG PO TB24: 25.0000 mg | ORAL_TABLET | Freq: Every day | ORAL | 0 refills | Status: DC

## 2017-08-26 MED ORDER — DILTIAZEM HCL ER COATED BEADS 240 MG PO CP24: 240.0000 mg | ORAL_CAPSULE | Freq: Every day | ORAL | Status: DC

## 2017-08-26 MED ORDER — WARFARIN SODIUM 2.5 MG PO TABS: 2.5000 mg | ORAL_TABLET | Freq: Every day | ORAL | Status: DC

## 2017-08-26 MED ORDER — FUROSEMIDE 40 MG PO TABS
40.0000 mg | ORAL_TABLET | Freq: Every day | ORAL | Status: DC
  Administered 2017-08-26: 40 mg via ORAL
  Filled 2017-08-26: qty 1

## 2017-08-26 MED ORDER — POLYETHYLENE GLYCOL 3350 17 GM/SCOOP PO POWD: 17.0000 g | Freq: Every day | ORAL | 1 refills | Status: AC

## 2017-08-26 MED ORDER — DILTIAZEM HCL ER COATED BEADS 240 MG PO CP24: 240.0000 mg | ORAL_CAPSULE | Freq: Every day | ORAL | 0 refills | Status: DC

## 2017-08-26 MED ORDER — METOPROLOL SUCCINATE ER 25 MG PO TB24: 25.0000 mg | ORAL_TABLET | Freq: Every day | ORAL | Status: DC

## 2017-08-26 MED ORDER — DIAZEPAM 2 MG PO TABS: 2.0000 mg | ORAL_TABLET | Freq: Every evening | ORAL | 0 refills | Status: DC | PRN

## 2017-08-26 NOTE — Progress Notes (Signed)
Physical Therapy Treatment Patient Details Name: Angel Meyer MRN: [id number] DOB: [birth year] Today's Date: 08/26/2017    History of Present Illness Pt is an 83 y.o. female admitted from [facility] on 08/23/17 with CHF exacerbation and worsening abdominal pain. CT scan showed cholelithiasis and small R inguinal hernia containing the cecum. Ultrasound showed gallstones with mild gallbladder distention and a possible Murphy sign but no other evidence of cholecystitis. Pertinent PMH includes afib, chronic back pain from compression fxs, OSA, HTN, urine incontinence, restless legs.    PT Comments    Pt sitting in recliner upon arrival with dtr present. Supervision for transfers from all surfaces. During ambulation vc to relax shoulders and to lift head while walking. Pt able to turn and sit in rollator for seated rest break due to increased pain in back. Pt able to use rollator safely no vc needed to apply break during sit to stand transfers with rollator. Pt is progressing well. Pt will continue to benefit from therapy to help increase their strength and endurance with function. Current plan remains appropriate.   Follow Up Recommendations  Home health PT;Supervision - Intermittent     Equipment Recommendations  None recommended by PT    Recommendations for Other Services       Precautions / Restrictions Precautions Precautions: Fall Restrictions Weight Bearing Restrictions: No    Mobility  Bed Mobility               General bed mobility comments: in recliner upon arrival  Transfers Overall transfer level: Needs assistance Equipment used: Rolling walker (2 wheeled) Transfers: Sit to/from Stand Sit to Stand: Supervision         General transfer comment: Pt able to stand from recliner with proper form and technique  Ambulation/Gait Ambulation/Gait assistance: Supervision;Min guard Ambulation Distance (Feet): 300 Feet Assistive device: Rolling walker (2 wheeled)   Gait velocity: Decreased   General Gait Details: supervision- min guard for safety due to pt report of increased pain in back during gait. Slow steady gait. VC for to relax shoulders and to hold head upright. Pt able to turn and sit in rollator for seated rest break. No cues needed for proper use of rollator. Pt is progressing well. Current plan remains appropriate.   Stairs            Wheelchair Mobility    Modified Rankin (Stroke Patients Only)       Balance   Sitting-balance support: No upper extremity supported;Feet supported Sitting balance-Leahy Scale: Good Sitting balance - Comments: Pt able to sit erect in recliner with no UE support   Standing balance support: No upper extremity supported;Bilateral upper extremity supported;During functional activity Standing balance-Leahy Scale: Fair Standing balance comment: pt able to stand with no UE support with assist to put bathrobe on.                             Cognition Arousal/Alertness: Awake/alert Behavior During Therapy: WFL for tasks assessed/performed Overall Cognitive Status: Within Functional Limits for tasks assessed                                        Exercises      General Comments        Pertinent Vitals/Pain Pain Score: 6  Pain Location: R lower back and abdomen Pain Descriptors / Indicators: Discomfort Pain  Intervention(s): Monitored during session;Repositioned    Home Living                      Prior Function            PT Goals (current goals can now be found in the care plan section) Acute Rehab PT Goals Patient Stated Goal: not discussed Progress towards PT goals: Progressing toward goals    Frequency    Min 3X/week      PT Plan Current plan remains appropriate    Co-evaluation              AM-PAC PT "6 Clicks" Daily Activity  Outcome Measure  Difficulty turning over in bed (including adjusting bedclothes, sheets and  blankets)?: None Difficulty moving from lying on back to sitting on the side of the bed? : None Difficulty sitting down on and standing up from a chair with arms (e.g., wheelchair, bedside commode, etc,.)?: None Help needed moving to and from a bed to chair (including a wheelchair)?: A Little Help needed walking in hospital room?: A Little Help needed climbing 3-5 steps with a railing? : A Little 6 Click Score: 21    End of Session Equipment Utilized During Treatment: Gait belt Activity Tolerance: Patient tolerated treatment well Patient left: in chair;with family/visitor present;with call bell/phone within reach Nurse Communication: Mobility status PT Visit Diagnosis: Other abnormalities of gait and mobility (R26.89);Muscle weakness (generalized) (M62.81)     Time: 8938-1017 PT Time Calculation (min) (ACUTE ONLY): 14 min  Charges:  $Gait Training: 8-22 mins                    G Codes:  Functional Assessment Tool Used: AM-PAC 6 Clicks Basic Mobility    Fransisca Connors, SPTA    Fransisca Connors 08/26/2017, 4:46 PM

## 2017-08-26 NOTE — Progress Notes (Signed)
Electrophysiology Rounding Note  Patient Name: Angel Meyer Date of Encounter: 08/26/2017  Electrophysiologist: Allred   Subjective   The patient did not sleep well last night. Her breathing is stable.  Back and flank pain has worsened.   Inpatient Medications    Scheduled Meds: . diltiazem  240 mg Oral Daily  . docusate sodium  100 mg Oral BID  . donepezil  10 mg Oral QHS  . escitalopram  10 mg Oral Daily  . furosemide  40 mg Oral Daily  . gabapentin  100 mg Oral QHS  . Melatonin  6 mg Oral QHS  . memantine  10 mg Oral BID  . metoprolol succinate  25 mg Oral Daily  . multivitamin  1 tablet Oral Daily  . oxybutynin  10 mg Oral Daily  . rOPINIRole  1 mg Oral BID  . rosuvastatin  40 mg Oral QHS  . spironolactone  25 mg Oral Daily  . Warfarin - Pharmacist Dosing Inpatient   Does not apply q1800   Continuous Infusions:  PRN Meds: acetaminophen **OR** acetaminophen, bisacodyl, diazepam, HYDROcodone-acetaminophen, ketorolac, nitroGLYCERIN, ondansetron **OR** ondansetron (ZOFRAN) IV, polyethylene glycol   Vital Signs    Vitals:   08/25/17 2207 08/25/17 2225 08/26/17 0356 08/26/17 0605  BP: 97/64 105/65 97/73 104/60  Pulse:   80 79  Resp: 18  18   Temp:   (!) 97.4 F (36.3 C)   TempSrc:   Oral   SpO2: 93%  96%   Weight:   195 lb 4.8 oz (88.6 kg)   Height:        Intake/Output Summary (Last 24 hours) at 08/26/2017 0903 Last data filed at 08/26/2017 0604 Gross per 24 hour  Intake 535 ml  Output 1425 ml  Net -890 ml   Filed Weights   08/24/17 0520 08/25/17 0514 08/26/17 0356  Weight: 194 lb 7.1 oz (88.2 kg) 196 lb 13.9 oz (89.3 kg) 195 lb 4.8 oz (88.6 kg)    Physical Exam    GEN- The patient is elderly appearing, alert and oriented x 3 today.   Head- normocephalic, atraumatic Eyes-  Sclera clear, conjunctiva pink Ears- hearing intact Oropharynx- clear Neck- supple Lungs- Clear to ausculation bilaterally, normal work of breathing Heart- Irregular rate  and rhythm GI- soft, NT, ND, + BS Extremities- no clubbing, cyanosis, or edema Skin- no rash or lesion Psych- euthymic mood, full affect Neuro- strength and sensation are intact  Labs    CBC Recent Labs    08/23/17 1350 08/24/17 0526  WBC 5.7 5.3  NEUTROABS 4.0  --   HGB 14.4 13.4  HCT 42.7 40.7  MCV 97.7 98.5  PLT 206 076   Basic Metabolic Panel Recent Labs    08/25/17 0445 08/26/17 0553  NA 135 134*  K 4.1 3.8  CL 99* 97*  CO2 26 27  GLUCOSE 88 92  BUN 29* 32*  CREATININE 1.46* 1.59*  CALCIUM 8.8* 8.9  MG  --  2.2   Liver Function Tests Recent Labs    08/24/17 0526 08/25/17 0445  AST 24 26  ALT 21 22  ALKPHOS 111 101  BILITOT 1.0 0.8  PROT 6.2* 5.9*  ALBUMIN 3.4* 3.4*   Recent Labs    08/23/17 1350  LIPASE 22    Telemetry    Rate controlled AF (personally reviewed)  Radiology    Nm Hepatobiliary Liver Func  Result Date: 08/24/2017 CLINICAL DATA:  Severe left upper quadrant pain.  Cholelithiasis. EXAM: NUCLEAR MEDICINE HEPATOBILIARY  IMAGING TECHNIQUE: Sequential images of the abdomen were obtained out to 60 minutes following intravenous administration of radiopharmaceutical. RADIOPHARMACEUTICALS:  5.3 mCi Tc-74m  Choletec IV COMPARISON:  None. FINDINGS: Prompt uptake and biliary excretion of activity by the liver is seen. Gallbladder activity is visualized, consistent with patency of cystic duct. Biliary activity passes into small bowel, consistent with patent common bile duct. IMPRESSION: Normal hepatobiliary imaging demonstrating patency of both cystic and common bile ducts. Electronically Signed   By: Angel Meyer M.D.   On: 08/24/2017 14:43     Patient Profile   83 y.o. female hx of persistent AFib historically has been difficult to control rate, there has been discussion she may end up needing pacer/AVN ablation, CHF (diastolic), CBP, HTN, HLD, OSA, significant arthritisadmitted with abdominal complaints and CHF exacerbation  Assessment &  Plan    1.  Permanent AF Rate control strategy  CHADS2VASC is 4 V rates controlled  2.  Chronic diastolic heart failure Echo yesterday with normal EF She has diuresed with IV Lasix Edema is resolved Shortness of breath at baseline Creat elevated from baseline - hold spironolactone today   3.  Abdominal/back pain Worsening Unclear etiology Per primary team   Signed, Angel Marshall, NP  08/26/2017, 9:03 AM   I have seen and examined this patient with Angel Meyer.  Agree with above, note added to reflect my findings.  On exam, iRRR, no murmurs, lungs clear.  Patient presented to the hospital with abdominal pain.  Was also found to be in acute on chronic diastolic heart failure.  Currently the patient does not have signs of volume overload and a creatinine has been elevated.  Would plan to switch Lasix to p.o.  Her main complaint today is of abdominal and back pain.  Her respiratory status is back to baseline per the patient.  She has diuresed up to 1 L overnight.  Angel Bannan M. Zaharah Amir MD 08/26/2017 12:28 PM

## 2017-08-26 NOTE — Progress Notes (Addendum)
PT Cancellation Note  Patient Details Name: Angel Meyer MRN: [id number] DOB: 1932/12/15   Cancelled Treatment:    Reason Eval/Treat Not Completed: (P) Pain limiting ability to participate(Pt reports just receiving pain meds and wanting to wait until they are working.  Will return after lunch.  )   Angel Meyer [name] 08/26/2017, 1:03 PM  Governor Rook[place], PTA pager [phone]

## 2017-08-26 NOTE — Clinical Social Work Note (Signed)
CSW facilitated patient discharge including contacting patient family and facility to confirm patient discharge plans. Clinical information faxed to facility and family agreeable with plan. Patient's daughter will transport patient by car back to [facility] RN to call report prior to discharge [phone]).  CSW will sign off for now as social work intervention is no longer needed. Please consult [place] again if new needs arise.  [place] Scrape, [place]

## 2017-08-26 NOTE — Progress Notes (Signed)
Orders received for pt discharge.  Discharge summary printed and reviewed with pt' daughter.  Explained medication regimen, and daughter had no further questions at this time.  IV removed and site remains clean, dry, intact.  Telemetry removed.  Pt in stable condition and awaiting transport via private car returning to [facility].

## 2017-08-26 NOTE — Plan of Care (Signed)
  Clinical Measurements: Ability to maintain clinical measurements within normal limits will improve 08/26/2017 0909 - Progressing by Imagene Gurney, RN

## 2017-08-26 NOTE — Discharge Summary (Signed)
Physician Discharge Summary  Angel Meyer QQV:[id number] DOB: 1933-03-28 DOA: 08/23/2017  PCP: Angel Post, MD  Admit date: 08/23/2017 Discharge date: 08/26/2017  Admitted From: ALF Disposition: ALF   Recommendations for Outpatient Follow-up:  1. Follow up with PCP and cardiology in 1-2 weeks 2. Monitor BMP at follow up 3. Follow up with orthopedics, Dr. Nelva Meyer for ongoing pain management.  Home Health: PT, OT, aide Equipment/Devices: 3 in 1 Discharge Condition: Stable CODE STATUS: DNR Diet recommendation: Heart healthy  Brief/Interim Summary: Angel Meyer an 83 y.o.femalewith a history of chronic atrial fibrillation on coumadin, CHF with exacerbation, chronic back pain, hypertension, hyperlipidemia, obstructive sleep apnea, osteoarthritis, urinary incontinence, restless leg syndrome, renal stones and cognitive impairment presented with complaints of abdominal pain. Patient states that she has been having left-sided abdominal pain for the last week, progressively getting worse, sharp, up to 7-8 out of 10 in intensity associated with some right flank pain and nausea but no vomiting or diarrhea. The pain has gotten worse despite taking pain medications. Patient also complains of worsening abdominal swelling along with swelling of her legs with some shortness of breath. Patient was seen in A. fib clinic yesterday and was asked to take an extra dose of Lasix. She did not notice much increase in her urination. No fever, chest pain, dysuria, hematuria, loss of consciousness or seizures.  Patient had CT scan of the abdomen which showed gallstones and right inguinal hernia without incarceration, with diffuse diverticulosis without diverticulitis. She was given intravenous Lasix. General surgery was called by the ED provider. Hospitalist service was called to evaluate the patient  LE DVU was ruled out with U/S. HIDA scan was normal. Lower extremity swelling improved with decrease  of diltiazem and start of metoprolol. Rate remained controlled. IV lasix was given and converted back to home oral dose at discharge. Her abdominal pain has imprved slightly but appears consistent with musculoskeletal pain for which she is treated by orthopedics.   Discharge Diagnoses:  Active Problems:   Hypertension   Hyperlipidemia   Atrial fibrillation (HCC)   Abdominal pain   Cholelithiasis   Acute on chronic diastolic CHF (congestive heart failure) (HCC)   CHF exacerbation (HCC)  Acute on chronic diastolic congestive heart failure: EDW thought to be ~189lbs, was 196lbs (possibly 206lbs by ED scale?) on admission. Echo on 05/13/2017 had shown EF of 55-60% with mild to moderate mitral regurgitation. Estimated CVP is 43mmHg. She appears euvolemic. - Convert lasix to home dose with restart of spironolactone in AM. - Daily weights, strict I/O, recheck Cr at follow up - Cardiology/EP consulting, will follow up next week.   Abdominal pain: Uncertain etiology, now mostly on lower ribs radiating from the back alternating on right and left. Appears most consistent with MSK etiology, seen by Dr. Nelva Meyer orthopedics. Negative HIDA, reducible hernia, no actual abdominal pain on exam.  - Continue pain control and antiemetics as ordered PTA.  Cholelithiasis: CT abdomen showed cholelithiasis without cholecystitis along with diffuse diverticulosis and right inguinal hernia. HIDA was normal, general surgery signed off.  - Can follow up as outpatient with general surgery.  Chronic atrial fibrillation on coumadin: Historically difficult to control rate, referred to AFib clinic.  - Currently rate controlled.Plan per cardiology is to titrate down CCB, start BB. - Continue coumadin   Hypertension: - Continue cardizem, lasix, spironolactone.  Dyslipidemia: - Continue statin  Cognitive impairment: Stable per daughter - Continue memantine, lexapro anddonepezil  Mild hyperkalemia: Resolved on  recheck ?hemolysis.  - Monitor  Chronic kidney disease stage III: No evidence of AKI at admission.  - Monitor BMP, bumped with diuresis.   Discharge Instructions Discharge Instructions    Diet - low sodium heart healthy   Complete by:  As directed    Increase activity slowly   Complete by:  As directed      Allergies as of 08/26/2017      Reactions   Ciprofloxacin Nausea And Vomiting   Demerol [meperidine] Nausea And Vomiting   Sulfa Antibiotics Nausea And Vomiting, Other (See Comments)   And all derivatives -  GI Bleeding (?)   Sulfacetamide Sodium Nausea And Vomiting, Other (See Comments)   GI bleeding, also (?)      Medication List    TAKE these medications   acetaminophen 500 MG tablet Commonly known as:  TYLENOL Take 500 mg 2 (two) times daily by mouth.   diazepam 2 MG tablet Commonly known as:  VALIUM Take 1 tablet (2 mg total) by mouth at bedtime as needed for muscle spasms.   diltiazem 240 MG 24 hr capsule Commonly known as:  CARDIZEM CD Take 1 capsule (240 mg total) by mouth daily. What changed:  when to take this   diltiazem 30 MG tablet Commonly known as:  CARDIZEM Take 30 mg by mouth every 6 (six) hours as needed (for A-FIB; HOLD & CALL MD FOR PULSE >120).   docusate sodium 100 MG capsule Commonly known as:  COLACE Take 100 mg by mouth daily.   donepezil 10 MG disintegrating tablet Commonly known as:  ARICEPT ODT Take 1 tablet (10 mg total) by mouth at bedtime.   escitalopram 10 MG tablet Commonly known as:  LEXAPRO TAKE 1 TABLET EVERY DAY What changed:    how much to take  how to take this  when to take this   furosemide 40 MG tablet Commonly known as:  LASIX Take 1 tablet (40 mg total) by mouth daily.   gabapentin 100 MG capsule Commonly known as:  NEURONTIN Take 100 mg by mouth at bedtime.   HYDROcodone-acetaminophen 7.5-325 MG tablet Commonly known as:  NORCO Take 1 tablet by mouth See admin instructions. 1 tablet three times a  day and may take an additional 2 doses as needed for nighttime pain between 2000 and 0800 What changed:    when to take this  reasons to take this  additional instructions   Magnesium 500 MG Tabs Take 500 mg by mouth at bedtime.   Melatonin 5 MG Tabs Take 5 mg at bedtime by mouth.   memantine 10 MG tablet Commonly known as:  NAMENDA Take 10 mg 2 (two) times daily by mouth.   metoprolol succinate 25 MG 24 hr tablet Commonly known as:  TOPROL-XL Take 1 tablet (25 mg total) by mouth daily. Start taking on:  08/27/2017   nitroGLYCERIN 0.4 MG SL tablet Commonly known as:  NITROSTAT Place 1 tablet (0.4 mg total) under the tongue every 5 (five) minutes as needed for chest pain. What changed:  when to take this   oxybutynin 10 MG 24 hr tablet Commonly known as:  DITROPAN-XL Take 10 mg by mouth at bedtime.   polyethylene glycol powder powder Commonly known as:  GLYCOLAX/MIRALAX Take 17 g by mouth daily. Take as needed to produce 1 normal bowel movement per day.   PRESERVISION AREDS Caps Take 1 capsule by mouth 2 (two) times daily.   Roller [employer] Used as Directed.   rOPINIRole 1 MG tablet Commonly known as:  REQUIP Take 1 tablet (1 mg total) by mouth See admin instructions. Take 1 tablet (1 mg) by mouth daily at 4pm and at bedtime What changed:    when to take this  additional instructions   rosuvastatin 40 MG tablet Commonly known as:  CRESTOR Take 1 tablet (40 mg total) by mouth at bedtime.   spironolactone 25 MG tablet Commonly known as:  ALDACTONE Take 1 tablet (25 mg total) by mouth daily.   triamcinolone cream 0.1 % Commonly known as:  KENALOG Apply 1 application topically See admin instructions. 1 application applied topically to both breasts two times a day as needed for irritation   trimethoprim 100 MG tablet Commonly known as:  TRIMPEX Take 100 mg by mouth daily.   trolamine salicylate 10 % cream Commonly known as:  ASPERCREME Apply 1  application every 12 (twelve) hours as needed topically for muscle pain.   warfarin 2.5 MG tablet Commonly known as:  COUMADIN Take as directed. If you are unsure how to take this medication, talk to your nurse or doctor. Original instructions:  Take as directed by anticoagulation clinic. What changed:    how much to take  how to take this  when to take this  additional instructions            Durable Medical Equipment  (From admission, onward)        Start     Ordered   08/26/17 1242  For home use only DME 3 n 1  Once     08/26/17 1241     Follow-up Information    Burchette, Alinda Sierras, MD Follow up.   Specialty:  Family Medicine Contact information: [place] [address] [phone]        Thompson Grayer, MD Follow up.   Specialty:  Cardiology Contact information: [address] [place] [address] [phone]          Allergies  Allergen Reactions  . Ciprofloxacin Nausea And Vomiting  . Demerol [Meperidine] Nausea And Vomiting  . Sulfa Antibiotics Nausea And Vomiting and Other (See Comments)    And all derivatives -  GI Bleeding (?)  . Sulfacetamide Sodium Nausea And Vomiting and Other (See Comments)    GI bleeding, also (?)    Consultations:  Cardiology  Procedures/Studies: Dg Chest 2 View  Result Date: 08/22/2017 CLINICAL DATA:  Dyspnea EXAM: CHEST  2 VIEW COMPARISON:  08/13/2017 FINDINGS: Chronic lung disease with prominent lung markings. Left lower lobe airspace disease unchanged. Negative for heart failure or pneumonia. No significant pleural effusion. Mild cardiac enlargement Right shoulder replacement.  Lumbar fusion hardware. IMPRESSION: Chronic lung disease with scarring and left lower lobe atelectasis, stable. No interval change. Electronically Signed   By: Franchot Gallo M.D.   On: 08/22/2017 15:06   Dg Chest 2 View  Result Date: 08/13/2017 CLINICAL DATA:  Acute onset of right upper quadrant  abdominal pain and right rib pain. Generalized weakness. EXAM: CHEST  2 VIEW COMPARISON:  Chest radiograph performed 08/13/2015 FINDINGS: The lungs are mildly hypoexpanded. Vascular congestion is noted. Retrocardiac airspace opacity may reflect pneumonia. No definite pleural effusion or pneumothorax is seen. The cardiomediastinal silhouette is borderline enlarged. No acute osseous abnormalities are seen. The patient's right shoulder arthroplasty is grossly unremarkable in appearance. IMPRESSION: Lungs mildly hypoexpanded. Vascular congestion and borderline cardiomegaly. Retrocardiac airspace opacity may reflect pneumonia. Electronically Signed   By: Garald Balding M.D.   On: 08/13/2017 01:05   Ct  Angio Chest Pe W And/or Wo Contrast  Result Date: 08/13/2017 CLINICAL DATA:  83 year old female with right upper abdominal pain radiating to the right ribcage. Concern for pulmonary embolism. EXAM: CT ANGIOGRAPHY CHEST WITH CONTRAST TECHNIQUE: Multidetector CT imaging of the chest was performed using the standard protocol during bolus administration of intravenous contrast. Multiplanar CT image reconstructions and MIPs were obtained to evaluate the vascular anatomy. CONTRAST:  71mL ISOVUE-370 IOPAMIDOL (ISOVUE-370) INJECTION 76% COMPARISON:  Chest radiograph dated 08/13/2017 and CT dated 06/20/2014 FINDINGS: Cardiovascular: There is moderate cardiomegaly with biatrial dilatation. No pericardial effusion. Multi vessel coronary vascular calcification noted. There is moderate atherosclerotic calcification of the thoracic aorta. No aneurysmal dilatation or evidence of dissection. There is no CT evidence of pulmonary embolism. Mediastinum/Nodes: No hilar or mediastinal adenopathy. Esophagus is grossly unremarkable. No mediastinal fluid collection. Lungs/Pleura: There is a 5.5 x 2.5 cm focal consolidative change at the right lung base posteriorly most likely represent round atelectasis. Clinical correlation and follow-up  recommended to exclude underlying mass. Linear bibasilar atelectasis/ scarring noted. There is a small left pleural effusion. No pneumothorax. The central airways are patent. Upper Abdomen: No acute abnormality. Musculoskeletal: Osteopenia with degenerative changes of the spine. Right shoulder arthroplasty. Review of the MIP images confirms the above findings. IMPRESSION: 1. No CT evidence of pulmonary embolism. 2. Small left pleural effusion and focal left lung base round atelectasis. Clinical correlation and follow-up recommended. 3. Cardiomegaly with coronary vascular calcification. 4.  Aortic Atherosclerosis (ICD10-I70.0). Electronically Signed   By: Anner Crete M.D.   On: 08/13/2017 03:32   Nm Hepatobiliary Liver Func  Result Date: 08/24/2017 CLINICAL DATA:  Severe left upper quadrant pain.  Cholelithiasis. EXAM: NUCLEAR MEDICINE HEPATOBILIARY IMAGING TECHNIQUE: Sequential images of the abdomen were obtained out to 60 minutes following intravenous administration of radiopharmaceutical. RADIOPHARMACEUTICALS:  5.3 mCi Tc-53m  Choletec IV COMPARISON:  None. FINDINGS: Prompt uptake and biliary excretion of activity by the liver is seen. Gallbladder activity is visualized, consistent with patency of cystic duct. Biliary activity passes into small bowel, consistent with patent common bile duct. IMPRESSION: Normal hepatobiliary imaging demonstrating patency of both cystic and common bile ducts. Electronically Signed   By: Earle Gell M.D.   On: 08/24/2017 14:43   Ct Abdomen Pelvis W Contrast  Result Date: 08/23/2017 CLINICAL DATA:  Left-sided abdominal pain and bilateral lower extremity swelling. EXAM: CT ABDOMEN AND PELVIS WITH CONTRAST TECHNIQUE: Multidetector CT imaging of the abdomen and pelvis was performed using the standard protocol following bolus administration of intravenous contrast. CONTRAST:  <See Chart> ISOVUE-300 IOPAMIDOL (ISOVUE-300) INJECTION 61% COMPARISON:  None. FINDINGS: Lower  chest: Stable area of rounded atelectasis at the left lung base when compared to prior chest CT scan from 11/17 218. The heart is mildly enlarged and coronary artery calcifications are noted. Hepatobiliary: No focal hepatic lesions or intrahepatic biliary dilatation. Gallbladder is mildly distended. A small calcified gallstone is noted. No CT findings to suggest acute cholecystitis. Pancreas: No mass, inflammation or ductal dilatation. Spleen: Normal size.  Small calcified granulomas. Adrenals/Urinary Tract: The adrenal glands are unremarkable. No renal lesions or hydronephrosis. The delayed images do not demonstrate any significant collecting system abnormality cyst. No worrisome renal lesions. Stomach/Bowel: The stomach, duodenum, small bowel and colon are grossly normal. No acute inflammatory changes, mass lesions or obstructive findings. Diffuse colonic diverticulosis without definite findings for acute diverticulitis. The terminal ileum is normal. Vascular/Lymphatic: Advanced atherosclerotic calcifications involving aorta and iliac arteries. No aneurysm or dissection. The branch vessels are patent.  The major venous structures are patent. The circumaortic left renal vein is noted. No mesenteric or retroperitoneal mass or adenopathy. Reproductive: Surgically absent. Other: There is a right inguinal hernia containing the cecum just distal to the ileocecal valve. No findings for obstruction or incarceration but could be a source of pain. Musculoskeletal: No significant bony findings. There are vertebral augmentation changes noted at L3 and there is spinal stabilization hardware at L1-2. Remote appearing L1 fracture. IMPRESSION: 1. Cholelithiasis without CT findings for acute cholecystitis. 2. Right inguinal hernia containing the cecum. No obvious obstruction/incarceration. 3. Diffuse colonic diverticulosis without findings for acute diverticulitis. 4. Advanced vascular disease. Electronically Signed   By: Marijo Sanes M.D.   On: 08/23/2017 16:06   Dg Chest Port 1 View  Result Date: 08/23/2017 CLINICAL DATA:  Bibasilar crackers. Exertional dyspnea. Chronic shortness of breath. EXAM: PORTABLE CHEST 1 VIEW COMPARISON:  08/22/2017 FINDINGS: The cardiac silhouette is enlarged. Mediastinal contours appear intact. There is no evidence of focal airspace consolidation, pleural effusion or pneumothorax. Chronic linear peribronchial densities in the left lung base. Osseous structures are without acute abnormality. Soft tissues are grossly normal. IMPRESSION: Persistent linear peribronchial airspace opacities in the left lung base. This may represent atelectasis, scarring or peribronchial airspace consolidation. Electronically Signed   By: Fidela Salisbury M.D.   On: 08/23/2017 13:31   US Abdomen Limited Ruq  Result Date: 08/23/2017 CLINICAL DATA:  83 year old with right upper quadrant pain. EXAM: ULTRASOUND ABDOMEN LIMITED RIGHT UPPER QUADRANT COMPARISON:  08/23/2017 FINDINGS: Gallbladder: Mild distention of the gallbladder. Gallbladder wall measures up to 0.2 cm. Echogenic foci in the gallbladder are suggestive for stones. Reportedly, the patient does have a sonographic Murphy sign. No evidence for pericholecystic fluid. Common bile duct: Diameter: 0.3 cm. Liver: Liver is not well visualized on this examination. Liver is mildly heterogeneous. No significant biliary dilatation. Portal vein is patent on color Doppler imaging with normal direction of blood flow towards the liver. IMPRESSION: Cholelithiasis with mild gallbladder distension and a Murphy sign. Findings are concerning for acute cholecystitis. No biliary dilatation. Electronically Signed   By: Markus Daft M.D.   On: 08/23/2017 17:50    Subjective: Breathing well, swelling improved. Left rib pain is gone, now it is right rib pain radiating from the back around to the mid upper abdomen worse with coughing, certain movements, and laying recumbent. No chest  pain or palpitations.   Discharge Exam: Vitals:   08/26/17 0356 08/26/17 0605  BP: 97/73 104/60  Pulse: 80 79  Resp: 18   Temp: (!) 97.4 F (36.3 C)   SpO2: 96%    General: Pt is alert, awake, not in acute distress Cardiovascular: Irreg irreg, no murmur, no JVD, trace pedal edema Respiratory: CTA bilaterally, Crackles at left base that clear with deep inspiration. Abdominal: Soft, NT, ND, bowel sounds +  Labs: BNP (last 3 results) Recent Labs    08/23/17 1603  BNP 409.8*   Basic Metabolic Panel: Recent Labs  Lab 08/23/17 1350 08/23/17 1604 08/24/17 0526 08/25/17 0445 08/26/17 0553  NA 133*  --  136 135 134*  K 5.5* 3.9 4.1 4.1 3.8  CL 99*  --  103 99* 97*  CO2 23  --  25 26 27   GLUCOSE 96  --  79 88 92  BUN 19  --  18 29* 32*  CREATININE 1.17*  --  1.16* 1.46* 1.59*  CALCIUM 9.3  --  9.2 8.8* 8.9  MG  --   --   --   --  2.2   Liver Function Tests: Recent Labs  Lab 08/23/17 1350 08/24/17 0526 08/25/17 0445  AST 52* 24 26  ALT 16 21 22   ALKPHOS 122 111 101  BILITOT 2.1* 1.0 0.8  PROT 6.8 6.2* 5.9*  ALBUMIN 4.0 3.4* 3.4*   Recent Labs  Lab 08/23/17 1350  LIPASE 22   No results for input(s): AMMONIA in the last 168 hours. CBC: Recent Labs  Lab 08/23/17 1350 08/24/17 0526  WBC 5.7 5.3  NEUTROABS 4.0  --   HGB 14.4 13.4  HCT 42.7 40.7  MCV 97.7 98.5  PLT 206 182   Cardiac Enzymes: No results for input(s): CKTOTAL, CKMB, CKMBINDEX, TROPONINI in the last 168 hours. BNP: Invalid input(s): POCBNP CBG: No results for input(s): GLUCAP in the last 168 hours. D-Dimer No results for input(s): DDIMER in the last 72 hours. Hgb A1c No results for input(s): HGBA1C in the last 72 hours. Lipid Profile No results for input(s): CHOL, HDL, LDLCALC, TRIG, CHOLHDL, LDLDIRECT in the last 72 hours. Thyroid function studies No results for input(s): TSH, T4TOTAL, T3FREE, THYROIDAB in the last 72 hours.  Invalid input(s): FREET3 Anemia work up No results for  input(s): VITAMINB12, FOLATE, FERRITIN, TIBC, IRON, RETICCTPCT in the last 72 hours. Urinalysis    Component Value Date/Time   COLORURINE YELLOW 08/13/2017 0400   APPEARANCEUR HAZY (A) 08/13/2017 0400   LABSPEC 1.043 (H) 08/13/2017 0400   PHURINE 6.0 08/13/2017 0400   GLUCOSEU NEGATIVE 08/13/2017 0400   HGBUR NEGATIVE 08/13/2017 0400   BILIRUBINUR NEGATIVE 08/13/2017 0400   BILIRUBINUR neg 08/03/2017 1153   KETONESUR 5 (A) 08/13/2017 0400   PROTEINUR NEGATIVE 08/13/2017 0400   UROBILINOGEN 0.2 08/03/2017 1153   NITRITE NEGATIVE 08/13/2017 0400   LEUKOCYTESUR NEGATIVE 08/13/2017 0400    Microbiology Recent Results (from the past 240 hour(s))  MRSA PCR Screening     Status: None   Collection Time: 08/23/17  9:13 PM  Result Value Ref Range Status   MRSA by PCR NEGATIVE NEGATIVE Final    Comment:        The GeneXpert MRSA Assay (FDA approved for NASAL specimens only), is one component of a comprehensive MRSA colonization surveillance program. It is not intended to diagnose MRSA infection nor to guide or monitor treatment for MRSA infections.     Time coordinating discharge: Approximately 40 minutes  Vance Gather, MD  [facility] 08/26/2017, 12:55 PM Pager [phone]

## 2017-08-26 NOTE — Progress Notes (Signed)
ANTICOAGULATION CONSULT NOTE -Follow up  Pharmacy Consult for warfarin  Indication: atrial fibrillation  Allergies  Allergen Reactions  . Ciprofloxacin Nausea And Vomiting  . Demerol [Meperidine] Nausea And Vomiting  . Sulfa Antibiotics Nausea And Vomiting and Other (See Comments)    And all derivatives -  GI Bleeding (?)  . Sulfacetamide Sodium Nausea And Vomiting and Other (See Comments)    GI bleeding, also (?)    Patient Measurements: Height: 5\' 1"  (154.9 cm) Weight: 195 lb 4.8 oz (88.6 kg)(sxcale c) IBW/kg (Calculated) : 47.8 kg   Vital Signs: Temp: 97.4 F (36.3 C) (11/30 0356) Temp Source: Oral (11/30 0356) BP: 104/60 (11/30 0605) Pulse Rate: 79 (11/30 0605)  Labs: Recent Labs    08/23/17 1350 08/24/17 0526 08/25/17 0445 08/26/17 0553  HGB 14.4 13.4  --   --   HCT 42.7 40.7  --   --   PLT 206 182  --   --   LABPROT 22.9* 22.6* 25.9* 24.7*  INR 2.04 2.02 2.39 2.26  CREATININE 1.17* 1.16* 1.46* 1.59*    Estimated Creatinine Clearance: 27.6 mL/min (A) (by C-G formula based on SCr of 1.59 mg/dL (H)).  Assessment: 83 yo female from [facility] admitted 08/23/17 with CHF exacerbation and worsening  abdominal pain . She is on warfarin PTA for h/o Afib- PTA dose 2.5mg  daily, last taken on 11/25.  INR remains therapeutic on home dose. Hgb and plts stable, no bleeding noted.  Noted patient is on PRN Toradol- only getting ~1-2 doses/day.  Goal of Therapy:  INR 2-3 Monitor platelets by anticoagulation protocol: Yes   Plan:  Warfarin 2.5mg  daily as per home dose Change INR to MWF only CBC in AM   Adhira Jamil D. Rosea Dory, PharmD, [place] Clinical Pharmacist Clinical Phone for 08/26/2017 until 3:30pm: [phone] If after 3:30pm, please call main pharmacy at [phone] 08/26/2017 12:03 PM

## 2017-08-26 NOTE — [place] FL2 (Signed)
[place] LEVEL OF CARE SCREENING TOOL     IDENTIFICATION  Patient Name: Angel Meyer Birthdate: [birth year] Sex: female Admission Date (Current Location): 08/23/2017  [facility] and [place] Number:  Herbalist and Address:  [facility], [place] [address] [place], [place] [address]      Provider Number: [id number]  Attending Physician Name and Address:  Patrecia Pour, MD  Relative Name and Phone Number:       Current Level of Care: Hospital Recommended Level of Care: Assisted Living Facility(with PT/OT, aide.) Prior Approval Number:    Date Approved/Denied:   PASRR Number:    Discharge Plan: Other (Comment)(ALF with PT/OT, aide)    Current Diagnoses: Patient Active Problem List   Diagnosis Date Noted  . Abdominal pain 08/23/2017  . Cholelithiasis 08/23/2017  . Acute on chronic diastolic CHF (congestive heart failure) ([place]) 08/23/2017  . CHF exacerbation ([place]) 08/23/2017  . Long term (current) use of anticoagulants 06/08/2017  . Lichen simplex chronicus 04/29/2017  . Risk for falls 12/17/2015  . Urinary urgency 10/29/2014  . Cognitive impairment 07/18/2014  . Encounter for therapeutic drug monitoring 10/18/2013  . Lung nodule 10/03/2013  . CHF (congestive heart failure) ([place]) 09/08/2013  . Cough 08/07/2013  . Low blood pressure reading 08/07/2013  . Cerumen impaction 08/07/2013  . Anticoagulant long-term use 07/27/2013  . Acute upper respiratory infections of unspecified site 07/27/2013  . Wheezy bronchitis 07/27/2013  . OSA (obstructive sleep apnea) 12/20/2011  . Hypertension 12/20/2011  . Hyperlipidemia 12/20/2011  . Osteoarthritis 12/20/2011  . History of kidney stones 12/20/2011  . Osteoporosis 12/20/2011  . Urinary incontinence, urge 12/20/2011  . Restless leg syndrome 12/20/2011  . Atrial fibrillation ([place]) 12/20/2011    Orientation RESPIRATION BLADDER Height & Weight     Self, Time, Situation, Place  Normal Incontinent Weight: 195 lb 4.8 oz (88.6 kg)(sxcale c) Height:  5\' 1"  (154.9 cm)  BEHAVIORAL SYMPTOMS/MOOD NEUROLOGICAL BOWEL NUTRITION STATUS  (None) (None) Continent Diet(Heart healthy)  AMBULATORY STATUS COMMUNICATION OF NEEDS Skin   Supervision Verbally Bruising, Other (Comment)(Skin tear. Open/dehisced wound/incision: Anterior lower right arm ([place] prn).)                       Personal Care Assistance Level of Assistance  Bathing, Feeding, Dressing Bathing Assistance: Limited assistance Feeding assistance: Limited assistance Dressing Assistance: Limited assistance     Functional Limitations Info  Sight, Hearing, Speech Sight Info: Adequate Hearing Info: Adequate Speech Info: Adequate    SPECIAL CARE FACTORS FREQUENCY  PT (By licensed PT), OT (By licensed OT), Blood pressure     PT Frequency: 3 x week OT Frequency: 3 x week            Contractures Contractures Info: Not present    Additional Factors Info  Code Status, Allergies Code Status Info: DNR Allergies Info: Ciprofloxacin, Demerol (Meperidine), Sulfa Antibiotics, Sulfacetamide Sodium.           Current Medications (08/26/2017):  This is the current hospital active medication list Current Facility-Administered Medications  Medication Dose Route Frequency Provider Last Rate Last Dose  . acetaminophen (TYLENOL) tablet 650 mg  650 mg Oral Q6H PRN Aline August, MD       Or  . acetaminophen (TYLENOL) suppository 650 mg  650 mg Rectal Q6H PRN Alekh, Kshitiz, MD      . bisacodyl (DULCOLAX) suppository 10 mg  10 mg Rectal Daily PRN Aline August, MD      .  diazepam (VALIUM) tablet 2 mg  2 mg Oral QHS PRN Aline August, MD      . diltiazem (CARDIZEM CD) 24 hr capsule 240 mg  240 mg Oral Daily Baldwin Jamaica, PA-C   240 mg at 08/26/17 1036  . docusate sodium (COLACE) capsule 100 mg  100 mg Oral BID Aline August, MD   100 mg at 08/26/17 1036  . donepezil (ARICEPT) tablet 10 mg  10 mg Oral QHS  Alekh, Kshitiz, MD   10 mg at 08/25/17 2210  . escitalopram (LEXAPRO) tablet 10 mg  10 mg Oral Daily Alekh, Kshitiz, MD   10 mg at 08/26/17 1035  . furosemide (LASIX) tablet 40 mg  40 mg Oral Daily Patrecia Pour, MD   40 mg at 08/26/17 1036  . gabapentin (NEURONTIN) capsule 100 mg  100 mg Oral QHS Aline August, MD   100 mg at 08/25/17 2209  . HYDROcodone-acetaminophen (NORCO) 7.5-325 MG per tablet 1 tablet  1 tablet Oral Q6H PRN Aline August, MD   1 tablet at 08/26/17 1201  . ketorolac (TORADOL) 15 MG/ML injection 15 mg  15 mg Intravenous Q6H PRN Aline August, MD   15 mg at 08/26/17 0244  . Melatonin TABS 6 mg  6 mg Oral QHS Aline August, MD   6 mg at 08/25/17 2216  . memantine (NAMENDA) tablet 10 mg  10 mg Oral BID Aline August, MD   10 mg at 08/26/17 1035  . metoprolol succinate (TOPROL-XL) 24 hr tablet 25 mg  25 mg Oral Daily Baldwin Jamaica, PA-C   25 mg at 08/26/17 1035  . multivitamin (PROSIGHT) tablet 1 tablet  1 tablet Oral Daily Aline August, MD   1 tablet at 08/25/17 2209  . nitroGLYCERIN (NITROSTAT) SL tablet 0.4 mg  0.4 mg Sublingual Q5 min PRN Starla Link, Kshitiz, MD      . ondansetron (ZOFRAN) tablet 4 mg  4 mg Oral Q6H PRN Starla Link, Kshitiz, MD   4 mg at 08/25/17 0944   Or  . ondansetron (ZOFRAN) injection 4 mg  4 mg Intravenous Q6H PRN Alekh, Kshitiz, MD      . oxybutynin (DITROPAN-XL) 24 hr tablet 10 mg  10 mg Oral Daily Starla Link, Kshitiz, MD   10 mg at 08/26/17 1037  . polyethylene glycol (MIRALAX / GLYCOLAX) packet 17 g  17 g Oral Daily PRN Aline August, MD   17 g at 08/26/17 1049  . rOPINIRole (REQUIP) tablet 1 mg  1 mg Oral BID Aline August, MD   1 mg at 08/25/17 2216  . rosuvastatin (CRESTOR) tablet 40 mg  40 mg Oral QHS Aline August, MD   40 mg at 08/25/17 2209  . [START ON 08/27/2017] spironolactone (ALDACTONE) tablet 25 mg  25 mg Oral Daily Chanetta Marshall K, NP      . warfarin (COUMADIN) tablet 2.5 mg  2.5 mg Oral q1800 Bajbus, Lauren D, RPH      . Warfarin -  Pharmacist Dosing Inpatient   Does not apply q1800 Kris Mouton, [facility]         Discharge Medications: Medication List             TAKE these medications            acetaminophen 500 MG tablet Commonly known as:  TYLENOL Take 500 mg 2 (two) times daily by mouth.    diazepam 2 MG tablet Commonly known as:  VALIUM Take 1 tablet (2 mg total) by mouth at bedtime  as needed for muscle spasms.    diltiazem 240 MG 24 hr capsule Commonly known as:  CARDIZEM CD Take 1 capsule (240 mg total) by mouth daily. What changed:  when to take this    diltiazem 30 MG tablet Commonly known as:  CARDIZEM Take 30 mg by mouth every 6 (six) hours as needed (for A-FIB; HOLD & CALL MD FOR PULSE >120).    docusate sodium 100 MG capsule Commonly known as:  COLACE Take 100 mg by mouth daily.    donepezil 10 MG disintegrating tablet Commonly known as:  ARICEPT ODT Take 1 tablet (10 mg total) by mouth at bedtime.    escitalopram 10 MG tablet Commonly known as:  LEXAPRO TAKE 1 TABLET EVERY DAY What changed:    how much to take  how to take this  when to take this    furosemide 40 MG tablet Commonly known as:  LASIX Take 1 tablet (40 mg total) by mouth daily.    gabapentin 100 MG capsule Commonly known as:  NEURONTIN Take 100 mg by mouth at bedtime.    HYDROcodone-acetaminophen 7.5-325 MG tablet Commonly known as:  NORCO Take 1 tablet by mouth See admin instructions. 1 tablet three times a day and may take an additional 2 doses as needed for nighttime pain between 2000 and 0800 What changed:    when to take this  reasons to take this  additional instructions    Magnesium 500 MG Tabs Take 500 mg by mouth at bedtime.    Melatonin 5 MG Tabs Take 5 mg at bedtime by mouth.    memantine 10 MG tablet Commonly known as:  NAMENDA Take 10 mg 2 (two) times daily by mouth.    metoprolol succinate 25 MG 24 hr tablet Commonly known as:  TOPROL-XL Take 1 tablet (25 mg total)  by mouth daily. Start taking on:  08/27/2017    nitroGLYCERIN 0.4 MG SL tablet Commonly known as:  NITROSTAT Place 1 tablet (0.4 mg total) under the tongue every 5 (five) minutes as needed for chest pain. What changed:  when to take this    oxybutynin 10 MG 24 hr tablet Commonly known as:  DITROPAN-XL Take 10 mg by mouth at bedtime.    polyethylene glycol powder powder Commonly known as:  GLYCOLAX/MIRALAX Take 17 g by mouth daily. Take as needed to produce 1 normal bowel movement per day.    PRESERVISION AREDS Caps Take 1 capsule by mouth 2 (two) times daily.    Roller [employer] Used as Directed.    rOPINIRole 1 MG tablet Commonly known as:  REQUIP Take 1 tablet (1 mg total) by mouth See admin instructions. Take 1 tablet (1 mg) by mouth daily at 4pm and at bedtime What changed:    when to take this  additional instructions    rosuvastatin 40 MG tablet Commonly known as:  CRESTOR Take 1 tablet (40 mg total) by mouth at bedtime.    spironolactone 25 MG tablet Commonly known as:  ALDACTONE Take 1 tablet (25 mg total) by mouth daily.    triamcinolone cream 0.1 % Commonly known as:  KENALOG Apply 1 application topically See admin instructions. 1 application applied topically to both breasts two times a day as needed for irritation    trimethoprim 100 MG tablet Commonly known as:  TRIMPEX Take 100 mg by mouth daily.    trolamine salicylate 10 % cream Commonly known as:  ASPERCREME Apply 1 application every 12 (twelve) hours  as needed topically for muscle pain.    warfarin 2.5 MG tablet Commonly known as:  COUMADIN Take as directed. If you are unsure how to take this medication, talk to your nurse or doctor. Original instructions:  Take as directed by anticoagulation clinic. What changed:    how much to take  how to take this  when to take this  additional instructions       Relevant Imaging Results:  Relevant Lab Results:   Additional  Information SS#: [id number]  Candie Chroman, LCSW

## 2017-08-26 NOTE — Clinical Social Work Note (Addendum)
CSW spoke with med tech at [employer]. She will review documentation faxed over and call CSW back. Patient's daughter will transport back to the facility.  Dayton Scrape, [place] [phone]  2:53 pm CSW received approval from ALF for patient to return. Patient and her daughter notified. Daughter wants to know when PT is coming to work with her. According to note, they tried to work with her this morning and patient declined due to pain. RN stated that PT had left the unit. Patient's daughter asking to see orders regarding PT/OT/aide at the facility and asked how often she will get therapy and the aide. CSW unable to show patient's daughter orders but stated that on FL2, it is documented that she get is 3 x per week as usual for PT/OT at ALF. Patient's daughter wants to know how often the aide will come in and for how long. CSW unable to answer this question so daughter will call facility. RN updated. PT in room now to work with patient.  Dayton Scrape, [place]

## 2017-08-28 ENCOUNTER — Telehealth: Payer: Self-pay | Admitting: Physician Assistant

## 2017-08-28 NOTE — Telephone Encounter (Signed)
Pt daughter called stating she has gained more than three pounds at her ALF and her feet and ankles are swelling similar to her recent hospitalization. I called the nurse manager Kassie Mends [phone]) and increased her lasix to 40 mg BID (given at 0800 and 1400) until she is back down to her dry weight of 195 lbs. She has an appt with heartcare on Wed, but I advised the daughter to have her seen earlier if possible to draw a BMP since I have doubled her lasix without potassium supplementation and because her creatinine was trending up at her last hospitalization. Daughter and nurse manager expressed understanding of the plan. They will send the lasix order with the pt next week to be signed by a provider.

## 2017-08-29 ENCOUNTER — Ambulatory Visit (INDEPENDENT_AMBULATORY_CARE_PROVIDER_SITE_OTHER): Payer: Medicare Other | Admitting: Nurse Practitioner

## 2017-08-29 ENCOUNTER — Telehealth: Payer: Self-pay | Admitting: Family Medicine

## 2017-08-29 ENCOUNTER — Telehealth: Payer: Self-pay | Admitting: Nurse Practitioner

## 2017-08-29 ENCOUNTER — Encounter: Payer: Self-pay | Admitting: Nurse Practitioner

## 2017-08-29 VITALS — BP 94/68 | HR 73 | Ht 61.0 in | Wt 200.0 lb

## 2017-08-29 DIAGNOSIS — I5032 Chronic diastolic (congestive) heart failure: Secondary | ICD-10-CM | POA: Diagnosis not present

## 2017-08-29 DIAGNOSIS — I481 Persistent atrial fibrillation: Secondary | ICD-10-CM | POA: Diagnosis not present

## 2017-08-29 DIAGNOSIS — R262 Difficulty in walking, not elsewhere classified: Secondary | ICD-10-CM | POA: Diagnosis not present

## 2017-08-29 DIAGNOSIS — R2681 Unsteadiness on feet: Secondary | ICD-10-CM | POA: Diagnosis not present

## 2017-08-29 DIAGNOSIS — I4819 Other persistent atrial fibrillation: Secondary | ICD-10-CM

## 2017-08-29 DIAGNOSIS — Z9181 History of falling: Secondary | ICD-10-CM | POA: Diagnosis not present

## 2017-08-29 DIAGNOSIS — I10 Essential (primary) hypertension: Secondary | ICD-10-CM | POA: Diagnosis not present

## 2017-08-29 DIAGNOSIS — R0602 Shortness of breath: Secondary | ICD-10-CM

## 2017-08-29 DIAGNOSIS — G4733 Obstructive sleep apnea (adult) (pediatric): Secondary | ICD-10-CM

## 2017-08-29 DIAGNOSIS — M545 Low back pain: Secondary | ICD-10-CM | POA: Diagnosis not present

## 2017-08-29 MED ORDER — FUROSEMIDE 40 MG PO TABS: 80.0000 mg | ORAL_TABLET | Freq: Every day | ORAL | 3 refills | Status: DC

## 2017-08-29 NOTE — Telephone Encounter (Signed)
I can see her today - someone else has cancelled.

## 2017-08-29 NOTE — Telephone Encounter (Signed)
Will route to [facility] for further review.

## 2017-08-29 NOTE — Progress Notes (Signed)
CARDIOLOGY OFFICE NOTE  Date:  08/29/2017    [name] Date of Birth: 1933/09/23 Medical Record #[id number]  PCP:  [name], MD  Cardiologist:  [place]   Chief Complaint  Patient presents with  . Shortness of Breath    Work in visit - seen for Dr. Rayann Heman    History of Present Illness: Milagro Belmares is a 83 y.o. female who presents today for a work in visit. Seen for Dr. Rayann Heman.   She has PAF, rheumatic fever at age 74, HTN, HLD, OA, incontinence, RLS, cognitive impairment and OSA. She has had a lung nodule that was followed by pulmonary which resolved. She failed on Flecainide and Tikosyn and was on low dose amiodarone. Therapy has been limited by her HR. She is previously from [place] and has now moved here with her daughter. She has had some progressive memory issues. She remains on coumadin anticoagulation.   Saw me in November of 2016 - found to be back in AF - was symptomatic - sent her for a cardioversion and back to EP. Amiodarone increased short term.   Saw Dr. Rayann Heman in December of 2016 - poor candidate for ablation -and they have elected for conservative therapy. She has had her dose of amiodarone cut back and subsequently stopped.   I last saw her back in March of 2018 - depressed. Very sedentary. More memory issues. Last seen by Dr. Rayann Heman back in October - noted difficulty in controlling her HR - to consider AV nodal ablation.   Seen in the AF clinic on 11/26 - was short of breath and having swelling. Also with back pain. Weight was up.   Admitted the next day with abdominal pain/shortness of breath - found to have gallstones and right inguinal hernia without incarceration with diffuse diverticulosis without diverticulitis. HIDA scan normal. Her CCB was cut back due to swelling/weight gain. Toprol was added.   She was given IV diuretic.   Comes back today. Here with her daughter. Has been home since Friday. Already not doing well. She notes  that the swelling was not resolved at time of discharge. Progressed over the weekend. She is at [facility] - has been there since July. No particular belly pain now but daughter notes she has still been complaining of belly pain and bloating. Still with swelling. She weighed 189 when seen by Dr. Rayann Heman back in October. She was previously doing pretty well - was walking 3 times a day - up until November 17th. Unclear as to the exact etiology for her demise. She started on Gabapentin about the middle of November. She endorses constipation - to the point she has been impacted.   Past Medical History:  Diagnosis Date  . Arthritis   . Diastolic dysfunction    preserved EF,  CHF In setting of afib previously  . History of blood transfusion   . Hyperlipidemia   . Hypertension   . Incontinence of urine   . OSA (obstructive sleep apnea)    does not use CPAP  . Osteoporosis   . Persistent atrial fibrillation (HCC)    failed on Flecainide, Tikosyn, and amiodarone  . Renal calculi   . Restless legs   . Rheumatic fever    age 62  . Systolic heart failure (HCC)    EF 30 to 35% per echo 08/2013,  subsequently has normalized    Past Surgical History:  Procedure Laterality Date  . ABDOMINAL HYSTERECTOMY    .  BREAST SURGERY     benign biopsy  . CARDIOVERSION N/A 09/10/2013   Procedure: CARDIOVERSION;  Surgeon: Thompson Grayer, MD;  Location: [facility];  Service: Cardiovascular;  Laterality: N/A;  . CARDIOVERSION N/A 11/16/2013   Procedure: CARDIOVERSION;  Surgeon: Sueanne Margarita, MD;  Location: [facility] ENDOSCOPY;  Service: Cardiovascular;  Laterality: N/A;  . CARDIOVERSION N/A 08/18/2015   Procedure: CARDIOVERSION;  Surgeon: Sueanne Margarita, MD;  Location: [facility] ENDOSCOPY;  Service: Cardiovascular;  Laterality: N/A;  . CARDIOVERSION N/A 03/07/2017   Procedure: CARDIOVERSION;  Surgeon: Dorothy Spark, MD;  Location: [facility] ENDOSCOPY;  Service: Cardiovascular;  Laterality: N/A;  . CARDIOVERSION N/A 05/13/2017    Procedure: CARDIOVERSION;  Surgeon: Lelon Perla, MD;  Location: [facility] ENDOSCOPY;  Service: Cardiovascular;  Laterality: N/A;  . IR RADIOLOGIST EVAL & MGMT  02/04/2017  . IR VERTEBROPLASTY LUMBAR BX INC UNI/BIL INC/INJECT/IMAGING  02/10/2017  . [place] removed  . REPLACEMENT TOTAL KNEE  2005  . SPINE SURGERY     laminectomy 1965, spinal fusion with rod 2005  . TEE WITHOUT CARDIOVERSION N/A 03/07/2017   Procedure: TRANSESOPHAGEAL ECHOCARDIOGRAM (TEE);  Surgeon: Dorothy Spark, MD;  Location: [facility] ENDOSCOPY;  Service: Cardiovascular;  Laterality: N/A;  . TEE WITHOUT CARDIOVERSION N/A 05/13/2017   Procedure: TRANSESOPHAGEAL ECHOCARDIOGRAM (TEE);  Surgeon: Lelon Perla, MD;  Location: [facility] ENDOSCOPY;  Service: Cardiovascular;  Laterality: N/A;  . TONSILLECTOMY       Medications: Current Meds  Medication Sig  . acetaminophen (TYLENOL) 500 MG tablet Take 500 mg 2 (two) times daily by mouth.  . diazepam (VALIUM) 2 MG tablet Take 1 tablet (2 mg total) by mouth at bedtime as needed for muscle spasms.  Marland Kitchen diltiazem (CARDIZEM CD) 240 MG 24 hr capsule Take 1 capsule (240 mg total) by mouth daily.  Marland Kitchen docusate sodium (COLACE) 100 MG capsule Take 100 mg by mouth daily.   Marland Kitchen donepezil (ARICEPT ODT) 10 MG disintegrating tablet Take 1 tablet (10 mg total) by mouth at bedtime.  Marland Kitchen escitalopram (LEXAPRO) 10 MG tablet TAKE 1 TABLET EVERY DAY (Patient taking differently: Take 10 mg by mouth once a day)  . gabapentin (NEURONTIN) 100 MG capsule Take 100 mg by mouth at bedtime.  Marland Kitchen HYDROcodone-acetaminophen (NORCO) 7.5-325 MG tablet Take 1 tablet by mouth See admin instructions. 1 tablet three times a day and may take an additional 2 doses as needed for nighttime pain between 2000 and 0800  . Magnesium 500 MG TABS Take 500 mg by mouth at bedtime.  . Melatonin 5 MG TABS Take 5 mg at bedtime by mouth.   . memantine (NAMENDA) 10 MG tablet Take 10 mg 2 (two) times daily by mouth.  . metoprolol  succinate (TOPROL-XL) 25 MG 24 hr tablet Take 1 tablet (25 mg total) by mouth daily.  . Misc. Devices (ROLLER [place]ona) MISC Used as Directed.  . Multiple Vitamins-Minerals (PRESERVISION AREDS) CAPS Take 1 capsule by mouth 2 (two) times daily.  . nitroGLYCERIN (NITROSTAT) 0.4 MG SL tablet Place 1 tablet (0.4 mg total) under the tongue every 5 (five) minutes as needed for chest pain. (Patient taking differently: Place 0.4 mg under the tongue every 5 (five) minutes x 3 doses as needed for chest pain. )  . oxybutynin (DITROPAN-XL) 10 MG 24 hr tablet Take 10 mg by mouth at bedtime.   . polyethylene glycol powder (GLYCOLAX/MIRALAX) powder Take 17 g by mouth daily. Take as needed to produce 1 normal bowel movement  per day.  Marland Kitchen rOPINIRole (REQUIP) 1 MG tablet Take 1 tablet (1 mg total) by mouth See admin instructions. Take 1 tablet (1 mg) by mouth daily at 4pm and at bedtime (Patient taking differently: Take 1 mg by mouth 2 (two) times daily. )  . spironolactone (ALDACTONE) 25 MG tablet Take 1 tablet (25 mg total) by mouth daily.  Marland Kitchen triamcinolone cream (KENALOG) 0.1 % Apply 1 application topically See admin instructions. 1 application applied topically to both breasts two times a day as needed for irritation  . trimethoprim (TRIMPEX) 100 MG tablet Take 100 mg by mouth daily.   Marland Kitchen trolamine salicylate (ASPERCREME) 10 % cream Apply 1 application every 12 (twelve) hours as needed topically for muscle pain.  Marland Kitchen warfarin (COUMADIN) 2.5 MG tablet Take as directed by anticoagulation clinic. (Patient taking differently: Take 2.5 mg by mouth every evening. )  . [DISCONTINUED] diltiazem (CARDIZEM) 30 MG tablet Take 30 mg by mouth every 6 (six) hours as needed (for A-FIB; HOLD & CALL MD FOR PULSE >120).   . [DISCONTINUED] furosemide (LASIX) 40 MG tablet Take 1 tablet (40 mg total) by mouth daily.  . [DISCONTINUED] rosuvastatin (CRESTOR) 40 MG tablet Take 1 tablet (40 mg total) by mouth at bedtime.      Allergies: Allergies  Allergen Reactions  . Ciprofloxacin Nausea And Vomiting  . Demerol [Meperidine] Nausea And Vomiting  . Sulfa Antibiotics Nausea And Vomiting and Other (See Comments)    And all derivatives -  GI Bleeding (?)  . Sulfacetamide Sodium Nausea And Vomiting and Other (See Comments)    GI bleeding, also (?)    Social History: The patient  reports that  has never smoked. she has never used smokeless tobacco. She reports that she does not drink alcohol or use drugs.   Family History: The patient's family history includes Cancer in her mother and paternal grandfather; Diabetes in her mother.   Review of Systems: Please see the history of present illness.   Otherwise, the review of systems is positive for none.   All other systems are reviewed and negative.   Physical Exam: VS:  BP 94/68   Pulse 73   Ht 5\' 1"  (1.549 m)   Wt 200 lb (90.7 kg)   BMI 37.79 kg/m  .  BMI Body mass index is 37.79 kg/m.  Wt Readings from Last 3 Encounters:  08/29/17 200 lb (90.7 kg)  08/26/17 195 lb 4.8 oz (88.6 kg)  08/22/17 206 lb 6.4 oz (93.6 kg)    General: Pleasant. Elderly female. Alert and in no acute distress.  She is in a wheelchair today.  HEENT: Normal.  Neck: Supple, no JVD, carotid bruits, or masses noted.  Cardiac: Irregular irregular rhythm Rate is ok. No murmurs, rubs, or gallops. + edema.  Respiratory:  Lungs are fairly clear to auscultation bilaterally with normal work of breathing.  GI: Soft and nontender.  MS: No deformity or atrophy. Gait not tested.  Skin: Warm and dry. Color is normal.  Neuro:  Strength and sensation are intact and no gross focal deficits noted.  Psych: Alert, appropriate and with normal affect.   LABORATORY DATA:  EKG:  EKG is ordered today. This demonstrates AF with controlled VR of 73 - anteroseptal Q's - poor R wave progression. Reviewed with Dr. Rayann Heman here in the office this afternoon.   Lab Results  Component Value Date    WBC 5.3 08/24/2017   HGB 13.4 08/24/2017   HCT 40.7 08/24/2017  PLT 182 08/24/2017   GLUCOSE 92 08/26/2017   CHOL 215 (H) 12/07/2016   TRIG 130 12/07/2016   HDL 78 12/07/2016   LDLCALC 111 (H) 12/07/2016   ALT 22 08/25/2017   AST 26 08/25/2017   NA 134 (L) 08/26/2017   K 3.8 08/26/2017   CL 97 (L) 08/26/2017   CREATININE 1.59 (H) 08/26/2017   BUN 32 (H) 08/26/2017   CO2 27 08/26/2017   TSH 1.740 12/07/2016   INR 2.26 08/26/2017     BNP (last 3 results) Recent Labs    08/23/17 1603  BNP 136.9*    ProBNP (last 3 results) No results for input(s): PROBNP in the last 8760 hours.   Other Studies Reviewed Today:  Echo Study Conclusions 08/25/2017  - Left ventricle: The cavity size was normal. Wall thickness was   increased in a pattern of mild LVH. Systolic function was normal.   The estimated ejection fraction was in the range of 50% to 55%. - Mitral valve: There was mild regurgitation. - Left atrium: The atrium was mildly dilated. - Pericardium, extracardiac: A trivial pericardial effusion was   identified.   CT ABDOMEN IMPRESSION 08/23/2017: 1. Cholelithiasis without CT findings for acute cholecystitis. 2. Right inguinal hernia containing the cecum. No obvious obstruction/incarceration. 3. Diffuse colonic diverticulosis without findings for acute diverticulitis. 4. Advanced vascular disease.    CT CHEST IMPRESSION 08/13/2017: 1. No CT evidence of pulmonary embolism. 2. Small left pleural effusion and focal left lung base round atelectasis. Clinical correlation and follow-up recommended. 3. Cardiomegaly with coronary vascular calcification. 4.  Aortic Atherosclerosis (ICD10-I70.0).   Electronically Signed   By: Anner Crete M.D.   On: 08/13/2017 03:32   Myoview Impression from February 2015 Exercise Capacity: Williams with no exercise.  BP Response: Hypotensive blood pressure response.  Clinical Symptoms: There is chest heaviness.  ECG  Impression: No significant ST segment change suggestive of ischemia.  Comparison with Prior Nuclear Study: No images to compare  Overall Impression: Normal stress nuclear study.  LV Ejection Fraction: Study not gated. LV Wall Motion: NA  Kirk Ruths   Echo Study Conclusions from 08/2015  - Left ventricle: The cavity size was normal. Wall thickness was  normal. Systolic function was normal. The estimated ejection  fraction was in the range of 55% to 60%. Wall motion was normal;  there were no regional wall motion abnormalities. Doppler  parameters are consistent with abnormal left ventricular  relaxation (grade 1 diastolic dysfunction). - Mitral valve: Valve area by pressure half-time: 1.48 cm^2. Valve  area by continuity equation (using LVOT flow): 1.08 cm^2. - Pulmonary arteries: Systolic pressure was mildly increased. PA  peak pressure: 35 mm Hg (S).  Assessment/Plan:  1. Acute on chronic diastolic congestive heart failure: unclear as to what her actual dry weight is - she was 189 in October. Most recent echo with normal EF. Will recheck lab today. Increasing diuretics today.   2. Abdominal pain - gallstones had negative HIDA - she is on chronic narcotics - probably has some degree of constipation as well. Needs Miralax given daily.   3. Cholelithiasis: CT abdomen showed cholelithiasis without cholecystitis along with diffuse diverticulosis and right inguinal hernia.HIDA was normal. May need to get back to general surgery but she looks pretty deconditioned at this time. Stopping statin for now.   4. Chronic atrial fibrillation on coumadin: Historically difficult to control rate - now with good rate control - they do not wish to proceed with AV nodal ablation/PPM -  cancelling Dr. Jackalyn Lombard visit for later this week. I have discussed her case with him today.   5. Hypertension: BP actually pretty soft now.   6. Cognitive impairment: per PCP  Current medicines are  reviewed with the patient today.  The patient does not have concerns regarding medicines other than what has been noted above.  The following changes have been made:  See above.  Labs/ tests ordered today include:    Orders Placed This Encounter  Procedures  . EKG 12-Lead     Disposition:   FU with me in a few weeks. Will cancel visit with Dr. Rayann Heman for later this week.   Patient is agreeable to this plan and will call if any problems develop in the interim.   SignedTruitt Merle, NP  08/29/2017 3:43 PM  [facility] [address] [place], [place]  [address] Phone: [phone] Fax: [phone]

## 2017-08-29 NOTE — Telephone Encounter (Signed)
New Message     Pt c/o swelling: STAT is pt has developed SOB within 24 hours  1) How much weight have you gained and in what time span? 3lb from Saturday and sunday  2) If swelling, where is the swelling located? Legs , and abdominal area   3) Are you currently taking a fluid pill?  Yes   4) Are you currently SOB?  no  5) Do you have a log of your daily weights (if so, list)? no  6) Have you gained 3 pounds in a day or 5 pounds in a week? no  7) Have you traveled recently? No   Angel Meyer increased her Lasik yesterday but it does not seem to be helping , Angel told her to call the office and get appt today if possible

## 2017-08-29 NOTE — Addendum Note (Signed)
Addended by: Michae Kava on: 08/29/2017 04:05 PM   Modules accepted: Orders

## 2017-08-29 NOTE — Telephone Encounter (Signed)
Left voice message for patient to return our call concerning TCM.

## 2017-08-29 NOTE — Patient Instructions (Addendum)
We will be checking the following labs today - BMET, CBC, BNP, HPF, amylase, lipase   Medication Instructions:    Continue with your current medicines. BUT  I am increasing the Lasix to 80 mg daily - start tomorrow - for 3 days - then cut to 60 mg a day  I am stopping Crestor  Give Miralax daily  Cancel prn Cardizem 30 mg order.     Testing/Procedures To Be Arranged:  N/A  Follow-Up:   See me in about 2 weeks or so; YOU HAVE AN APPT TO SEE Angel Merle, NP 09/12/17 @ 11:30   Cancel Dr. Jackalyn Lombard visit for this week    Other Special Instructions:   N/A    If you need a refill on your cardiac medications before your next appointment, please call your pharmacy.   Call the [place] office at [phone] if you have any questions, problems or concerns.

## 2017-08-30 DIAGNOSIS — R2681 Unsteadiness on feet: Secondary | ICD-10-CM | POA: Diagnosis not present

## 2017-08-30 DIAGNOSIS — M545 Low back pain: Secondary | ICD-10-CM | POA: Diagnosis not present

## 2017-08-30 DIAGNOSIS — R262 Difficulty in walking, not elsewhere classified: Secondary | ICD-10-CM | POA: Diagnosis not present

## 2017-08-30 DIAGNOSIS — Z9181 History of falling: Secondary | ICD-10-CM | POA: Diagnosis not present

## 2017-08-30 LAB — AMYLASE: Amylase: 67 U/L (ref 31–124)

## 2017-08-30 LAB — BASIC METABOLIC PANEL
BUN/Creatinine Ratio: 19 (ref 12–28)
BUN: 24 mg/dL (ref 8–27)
CO2: 20 mmol/L (ref 20–29)
Calcium: 9.6 mg/dL (ref 8.7–10.3)
Chloride: 103 mmol/L (ref 96–106)
Creatinine, Ser: 1.28 mg/dL — ABNORMAL HIGH (ref 0.57–1.00)
GFR calc Af Amer: 45 mL/min/{1.73_m2} — ABNORMAL LOW (ref >59)
GFR calc non Af Amer: 39 mL/min/{1.73_m2} — ABNORMAL LOW (ref >59)
Glucose: 81 mg/dL (ref 65–99)
Potassium: 4.7 mmol/L (ref 3.5–5.2)
Sodium: 141 mmol/L (ref 134–144)

## 2017-08-30 LAB — HEPATIC FUNCTION PANEL
ALT: 22 IU/L (ref 0–32)
AST: 27 IU/L (ref 0–40)
Albumin: 4.3 g/dL (ref 3.5–4.7)
Alkaline Phosphatase: 131 IU/L — ABNORMAL HIGH (ref 39–117)
Bilirubin Total: 0.6 mg/dL (ref 0.0–1.2)
Bilirubin, Direct: 0.2 mg/dL (ref 0.00–0.40)
Total Protein: 6.6 g/dL (ref 6.0–8.5)

## 2017-08-30 LAB — LIPASE: Lipase: 23 U/L (ref 14–85)

## 2017-08-30 NOTE — Telephone Encounter (Signed)
I spoke with daughter Mardene Celeste, she was driving and we could not go through the TCM list. Dr. Elease Hashimoto should be receiving a order for palliative care and [facility] is going to fax over request, patient is currently in assisted living. Mardene Celeste declined at this time to schedule appointment with Dr. Elease Hashimoto, patient was seen on 08/29/2017 at cardiology.

## 2017-08-31 ENCOUNTER — Telehealth: Payer: Self-pay | Admitting: Nurse Practitioner

## 2017-08-31 ENCOUNTER — Ambulatory Visit: Payer: Medicare Other | Admitting: Internal Medicine

## 2017-08-31 DIAGNOSIS — R2681 Unsteadiness on feet: Secondary | ICD-10-CM | POA: Diagnosis not present

## 2017-08-31 DIAGNOSIS — M545 Low back pain: Secondary | ICD-10-CM | POA: Diagnosis not present

## 2017-08-31 DIAGNOSIS — R262 Difficulty in walking, not elsewhere classified: Secondary | ICD-10-CM | POA: Diagnosis not present

## 2017-08-31 DIAGNOSIS — Z9181 History of falling: Secondary | ICD-10-CM | POA: Diagnosis not present

## 2017-08-31 NOTE — Telephone Encounter (Signed)
New message    Patient daughter calling for results. Please call

## 2017-08-31 NOTE — Telephone Encounter (Signed)
Pt's daughter aware of lab results and recommendations ./cy

## 2017-09-01 NOTE — Addendum Note (Signed)
Encounter addended by: Sherran Needs, NP on: 09/01/2017 9:32 AM  Actions taken: LOS modified

## 2017-09-02 ENCOUNTER — Ambulatory Visit (HOSPITAL_COMMUNITY)
Admission: RE | Admit: 2017-09-02 | Discharge: 2017-09-02 | Disposition: A | Discharge status: Home / Self Care | Payer: Medicare Other | Source: Ambulatory Visit | Attending: Nurse Practitioner | Admitting: Nurse Practitioner

## 2017-09-02 ENCOUNTER — Telehealth: Payer: Self-pay | Admitting: Nurse Practitioner

## 2017-09-02 ENCOUNTER — Telehealth: Payer: Self-pay

## 2017-09-02 VITALS — BP 104/62 | HR 80 | Ht 61.0 in | Wt 201.8 lb

## 2017-09-02 DIAGNOSIS — M1990 Unspecified osteoarthritis, unspecified site: Secondary | ICD-10-CM | POA: Insufficient documentation

## 2017-09-02 DIAGNOSIS — I110 Hypertensive heart disease with heart failure: Secondary | ICD-10-CM | POA: Diagnosis not present

## 2017-09-02 DIAGNOSIS — Z885 Allergy status to narcotic agent status: Secondary | ICD-10-CM | POA: Insufficient documentation

## 2017-09-02 DIAGNOSIS — I5022 Chronic systolic (congestive) heart failure: Secondary | ICD-10-CM | POA: Insufficient documentation

## 2017-09-02 DIAGNOSIS — R609 Edema, unspecified: Secondary | ICD-10-CM | POA: Insufficient documentation

## 2017-09-02 DIAGNOSIS — Z833 Family history of diabetes mellitus: Secondary | ICD-10-CM | POA: Insufficient documentation

## 2017-09-02 DIAGNOSIS — Z881 Allergy status to other antibiotic agents status: Secondary | ICD-10-CM | POA: Insufficient documentation

## 2017-09-02 DIAGNOSIS — Z79899 Other long term (current) drug therapy: Secondary | ICD-10-CM | POA: Insufficient documentation

## 2017-09-02 DIAGNOSIS — G4733 Obstructive sleep apnea (adult) (pediatric): Secondary | ICD-10-CM | POA: Diagnosis not present

## 2017-09-02 DIAGNOSIS — Z882 Allergy status to sulfonamides status: Secondary | ICD-10-CM | POA: Insufficient documentation

## 2017-09-02 DIAGNOSIS — I509 Heart failure, unspecified: Secondary | ICD-10-CM

## 2017-09-02 DIAGNOSIS — Z87442 Personal history of urinary calculi: Secondary | ICD-10-CM | POA: Insufficient documentation

## 2017-09-02 DIAGNOSIS — Z7901 Long term (current) use of anticoagulants: Secondary | ICD-10-CM | POA: Insufficient documentation

## 2017-09-02 DIAGNOSIS — E785 Hyperlipidemia, unspecified: Secondary | ICD-10-CM | POA: Insufficient documentation

## 2017-09-02 DIAGNOSIS — I482 Chronic atrial fibrillation: Secondary | ICD-10-CM | POA: Diagnosis present

## 2017-09-02 DIAGNOSIS — I4891 Unspecified atrial fibrillation: Secondary | ICD-10-CM | POA: Diagnosis not present

## 2017-09-02 DIAGNOSIS — G2581 Restless legs syndrome: Secondary | ICD-10-CM | POA: Insufficient documentation

## 2017-09-02 DIAGNOSIS — Z981 Arthrodesis status: Secondary | ICD-10-CM | POA: Diagnosis not present

## 2017-09-02 DIAGNOSIS — Z90710 Acquired absence of both cervix and uterus: Secondary | ICD-10-CM | POA: Diagnosis not present

## 2017-09-02 LAB — COMPREHENSIVE METABOLIC PANEL
ALBUMIN: 3.3 g/dL — AB (ref 3.5–5.0)
ALK PHOS: 117 U/L (ref 38–126)
ALT: 19 U/L (ref 14–54)
ANION GAP: 10 (ref 5–15)
AST: 26 U/L (ref 15–41)
BILIRUBIN TOTAL: 1.2 mg/dL (ref 0.3–1.2)
BUN: 19 mg/dL (ref 6–20)
CALCIUM: 9.3 mg/dL (ref 8.9–10.3)
CO2: 22 mmol/L (ref 22–32)
Chloride: 104 mmol/L (ref 101–111)
Creatinine, Ser: 1.23 mg/dL — ABNORMAL HIGH (ref 0.44–1.00)
GFR calc Af Amer: 46 mL/min — ABNORMAL LOW (ref >60)
GFR calc non Af Amer: 40 mL/min — ABNORMAL LOW (ref >60)
GLUCOSE: 93 mg/dL (ref 65–99)
Potassium: 4.3 mmol/L (ref 3.5–5.1)
Sodium: 136 mmol/L (ref 135–145)
TOTAL PROTEIN: 5.6 g/dL — AB (ref 6.5–8.1)

## 2017-09-02 LAB — CBC
HEMATOCRIT: 39.8 % (ref 36.0–46.0)
HEMOGLOBIN: 13.3 g/dL (ref 12.0–15.0)
MCH: 32.8 pg (ref 26.0–34.0)
MCHC: 33.4 g/dL (ref 30.0–36.0)
MCV: 98 fL (ref 78.0–100.0)
Platelets: 183 10*3/uL (ref 150–400)
RBC: 4.06 MIL/uL (ref 3.87–5.11)
RDW: 14.9 % (ref 11.5–15.5)
WBC: 7.5 10*3/uL (ref 4.0–10.5)

## 2017-09-02 LAB — BRAIN NATRIURETIC PEPTIDE: B Natriuretic Peptide: 258.1 pg/mL — ABNORMAL HIGH (ref 0.0–100.0)

## 2017-09-02 LAB — TSH: TSH: 2.902 u[IU]/mL (ref 0.350–4.500)

## 2017-09-02 MED ORDER — TORSEMIDE 20 MG PO TABS
20.0000 mg | ORAL_TABLET | Freq: Once | ORAL | Status: DC
  Filled 2017-09-02: qty 1

## 2017-09-02 MED ORDER — TORSEMIDE 20 MG PO TABS: 20.0000 mg | ORAL_TABLET | Freq: Two times a day (BID) | ORAL | 3 refills | Status: DC

## 2017-09-02 NOTE — Progress Notes (Signed)
Primary Care Physician: [name], MD Referring Physician: Dr. Drucie Ip Archambault is a 83 y.o. female with a h/o chronic Afib,with refractory v rates to rate control, diastolic dysfunction, edema, that is being seen as an urgent visit, 12/7, as daughter called [address] this am stating that mother is still not feeling well and continuing to retain fluid. She was seen by Truitt Merle, NP, this past Monday as f/u to the hospital 11/27 to 11/30, treated  for abdominal and back pain as well as fluid gain. No etiology for her pain was found.  CT scan of the abdomen which showed gallstones and right inguinal hernia without incarceration, with diffuse diverticulosis without diverticulitis. She was given intravenous Lasix. General surgery was called by the ED provider. Hospitalist service was called to evaluate the patient  LE DVU was ruled out with U/S. HIDA scan was normal. Lower extremity swelling improved with decrease of diltiazem and start of metoprolol. Rate remained controlled. IV lasix was given and converted back to home oral dose at discharge. Her abdominal pain had improved slightly but appears consistent with musculoskeletal pain for which she is treated by orthopedics.  She was on 80 mg of lasix for a few days but was then reduced to 60 mg daily.  In the afib clinic, her weight is 201 lbs, up one lb from when seen on 12/5. She has been on 80 mg lasix since Monday and went to 60 mg this am. She was d/c on 40 mg lasix daily from the hospital. She feels very bloated and sore in her abdomen, but is not having the acute abdominal/back pain as she was last week. Felt the need to sleep in recliner last night for the shortness of breath. Afib is rate controlled.  Today, she denies symptoms of palpitations, chest pain,  , orthopnea, PND,   dizziness, presyncope, syncope, or neurologic sequela. + for LLE and shortness of breath. The patient is tolerating medications without  difficulties and is otherwise without complaint today.   Past Medical History:  Diagnosis Date  . Arthritis   . Diastolic dysfunction    preserved EF,  CHF In setting of afib previously  . History of blood transfusion   . Hyperlipidemia   . Hypertension   . Incontinence of urine   . OSA (obstructive sleep apnea)    does not use CPAP  . Osteoporosis   . Persistent atrial fibrillation (HCC)    failed on Flecainide, Tikosyn, and amiodarone  . Renal calculi   . Restless legs   . Rheumatic fever    age 15  . Systolic heart failure (HCC)    EF 30 to 35% per echo 08/2013,  subsequently has normalized   Past Surgical History:  Procedure Laterality Date  . ABDOMINAL HYSTERECTOMY    . BREAST SURGERY     benign biopsy  . CARDIOVERSION N/A 09/10/2013   Procedure: CARDIOVERSION;  Surgeon: Thompson Grayer, MD;  Location: [place];  Service: Cardiovascular;  Laterality: N/A;  . CARDIOVERSION N/A 11/16/2013   Procedure: CARDIOVERSION;  Surgeon: Sueanne Margarita, MD;  Location[facility] ENDOSCOPY;  Service: Cardiovascular;  Laterality: N/A;  . CARDIOVERSION N/A 08/18/2015   Procedure: CARDIOVERSION;  Surgeon: Sueanne Margarita, MD;  Location: [facility] ENDOSCOPY;  Service: Cardiovascular;  Laterality: N/A;  . CARDIOVERSION N/A 03/07/2017   Procedure: CARDIOVERSION;  Surgeon: Dorothy Spark, MD;  Location: [facility] ENDOSCOPY;  Service: Cardiovascular;  Laterality: N/A;  . CARDIOVERSION N/A 05/13/2017   Procedure:  CARDIOVERSION;  Surgeon: Lelon Perla, MD;  Location: [facility] ENDOSCOPY;  Service: Cardiovascular;  Laterality: N/A;  . IR RADIOLOGIST EVAL & MGMT  02/04/2017  . IR VERTEBROPLASTY LUMBAR BX INC UNI/BIL INC/INJECT/IMAGING  02/10/2017  . [place] removed  . REPLACEMENT TOTAL KNEE  2005  . SPINE SURGERY     laminectomy 1965, spinal fusion with rod 2005  . TEE WITHOUT CARDIOVERSION N/A 03/07/2017   Procedure: TRANSESOPHAGEAL ECHOCARDIOGRAM (TEE);  Surgeon: Dorothy Spark, MD;  Location: [facility]  ENDOSCOPY;  Service: Cardiovascular;  Laterality: N/A;  . TEE WITHOUT CARDIOVERSION N/A 05/13/2017   Procedure: TRANSESOPHAGEAL ECHOCARDIOGRAM (TEE);  Surgeon: Lelon Perla, MD;  Location: [facility] ENDOSCOPY;  Service: Cardiovascular;  Laterality: N/A;  . TONSILLECTOMY      Current Outpatient Medications  Medication Sig Dispense Refill  . acetaminophen (TYLENOL) 500 MG tablet Take 500 mg 2 (two) times daily by mouth.    . diazepam (VALIUM) 2 MG tablet Take 1 tablet (2 mg total) by mouth at bedtime as needed for muscle spasms. 5 tablet 0  . diltiazem (CARDIZEM CD) 240 MG 24 hr capsule Take 1 capsule (240 mg total) by mouth daily. 30 capsule 0  . docusate sodium (COLACE) 100 MG capsule Take 100 mg by mouth daily.     Marland Kitchen donepezil (ARICEPT ODT) 10 MG disintegrating tablet Take 1 tablet (10 mg total) by mouth at bedtime. 90 tablet 2  . escitalopram (LEXAPRO) 10 MG tablet TAKE 1 TABLET EVERY DAY (Patient taking differently: Take 10 mg by mouth once a day) 90 tablet 1  . furosemide (LASIX) 40 MG tablet Take 2 tablets (80 mg total) by mouth daily. Daily for 3 days, then decrease to 60 mg a day 90 tablet 3  . gabapentin (NEURONTIN) 100 MG capsule Take 100 mg by mouth at bedtime.    Marland Kitchen HYDROcodone-acetaminophen (NORCO) 7.5-325 MG tablet Take 1 tablet by mouth See admin instructions. 1 tablet three times a day and may take an additional 2 doses as needed for nighttime pain between 2000 and 0800 15 tablet 0  . Magnesium 500 MG TABS Take 500 mg by mouth at bedtime.    . Melatonin 5 MG TABS Take 5 mg at bedtime by mouth.     . memantine (NAMENDA) 10 MG tablet Take 10 mg 2 (two) times daily by mouth.    . metoprolol succinate (TOPROL-XL) 25 MG 24 hr tablet Take 1 tablet (25 mg total) by mouth daily. 30 tablet 0  . Misc. Devices (ROLLER [place]) MISC Used as Directed. 1 each 0  . Multiple Vitamins-Minerals (PRESERVISION AREDS) CAPS Take 1 capsule by mouth 2 (two) times daily.    . nitroGLYCERIN (NITROSTAT) 0.4 MG  SL tablet Place 1 tablet (0.4 mg total) under the tongue every 5 (five) minutes as needed for chest pain. (Patient taking differently: Place 0.4 mg under the tongue every 5 (five) minutes x 3 doses as needed for chest pain. ) 25 tablet 3  . oxybutynin (DITROPAN-XL) 10 MG 24 hr tablet Take 10 mg by mouth at bedtime.   11  . polyethylene glycol powder (GLYCOLAX/MIRALAX) powder Take 17 g by mouth daily. Take as needed to produce 1 normal bowel movement per day. 3350 g 1  . rOPINIRole (REQUIP) 1 MG tablet Take 1 tablet (1 mg total) by mouth See admin instructions. Take 1 tablet (1 mg) by mouth daily at 4pm and at bedtime (Patient taking differently: Take 1 mg by mouth  2 (two) times daily. ) 180 tablet 2  . spironolactone (ALDACTONE) 25 MG tablet Take 1 tablet (25 mg total) by mouth daily. 90 tablet 3  . triamcinolone cream (KENALOG) 0.1 % Apply 1 application topically See admin instructions. 1 application applied topically to both breasts two times a day as needed for irritation    . trimethoprim (TRIMPEX) 100 MG tablet Take 100 mg by mouth daily.   11  . trolamine salicylate (ASPERCREME) 10 % cream Apply 1 application every 12 (twelve) hours as needed topically for muscle pain.    Marland Kitchen warfarin (COUMADIN) 2.5 MG tablet Take as directed by anticoagulation clinic. (Patient taking differently: Take 2.5 mg by mouth every evening. ) 105 tablet 1   No current facility-administered medications for this encounter.     Allergies  Allergen Reactions  . Ciprofloxacin Nausea And Vomiting  . Demerol [Meperidine] Nausea And Vomiting  . Sulfa Antibiotics Nausea And Vomiting and Other (See Comments)    And all derivatives -  GI Bleeding (?)  . Sulfacetamide Sodium Nausea And Vomiting and Other (See Comments)    GI bleeding, also (?)    Social History   Socioeconomic History  . Marital status: Divorced    Spouse name: Not on file  . Number of children: Not on file  . Years of education: Not on file  . Highest  education level: Not on file  Social Needs  . Financial resource strain: Not on file  . Food insecurity - worry: Not on file  . Food insecurity - inability: Not on file  . Transportation needs - medical: Not on file  . Transportation needs - non-medical: Not on file  Occupational History  . Not on file  Tobacco Use  . Smoking status: Never Smoker  . Smokeless tobacco: Never Used  Substance and Sexual Activity  . Alcohol use: No  . Drug use: No  . Sexual activity: Not Currently  Other Topics Concern  . Not on file  Social History Narrative   Lives in [place] alone.  Spends 4-6 months per year in [place] with her daughter.   Retired Network engineer    Family History  Problem Relation Age of Onset  . Cancer Mother        breast  . Diabetes Mother   . Cancer Paternal Grandfather     ROS- All systems are reviewed and negative except as per the HPI above  Physical Exam: There were no vitals filed for this visit. Wt Readings from Last 3 Encounters:  08/29/17 200 lb (90.7 kg)  08/26/17 195 lb 4.8 oz (88.6 kg)  08/22/17 206 lb 6.4 oz (93.6 kg)    Labs: Lab Results  Component Value Date   NA 141 08/29/2017   K 4.7 08/29/2017   CL 103 08/29/2017   CO2 20 08/29/2017   GLUCOSE 81 08/29/2017   BUN 24 08/29/2017   CREATININE 1.28 (H) 08/29/2017   CALCIUM 9.6 08/29/2017   MG 2.2 08/26/2017   Lab Results  Component Value Date   INR 2.26 08/26/2017   Lab Results  Component Value Date   CHOL 215 (H) 12/07/2016   HDL 78 12/07/2016   LDLCALC 111 (H) 12/07/2016   TRIG 130 12/07/2016     GEN- The patient is well appearing, alert and oriented x 3 today.   Head- normocephalic, atraumatic Eyes-  Sclera clear, conjunctiva pink Ears- hearing intact Oropharynx- clear Neck- supple, no JVP Lymph- no cervical lymphadenopathy Lungs- Clear to ausculation bilaterally, normal  work of breathing Heart- irregular rate and rhythm, no murmurs, rubs or gallops, PMI not laterally  displaced GI- soft, NT, ND, + BS Extremities- no clubbing, cyanosis, or 4+ pedal edma, worse around feet and shin area MS- no significant deformity or atrophy Skin- no rash or lesion Psych- euthymic mood, full affect Neuro- strength and sensation are intact  EKG-afib at 80 bpm, qrs int 82 ms, qtc 438 ms Epic records reviewed    Assessment and Plan: 1. Afib Rate controlled Continue 240 mg cardizem and toprol xl 25 mg daily Consider, if LLE continues to be a problem, wean off CCB and increase BB to keep rate controlled  2.Edema Appears to be refractory to lasix with no  weight loss with higher levels of po lasix with abdominal bloating/shortness of breath Stop lasix  Start torsemide 20 mg bid Weigh daily Low sodium diet  Elevate LLE when sitting  Cmet/cbc/tsh/bnp  F/u in afib clinic with repeat Bmet, BNP, weight, EKG, early next week weather permitting If condition worsens to the ER One dose of torsemide 20 mg daily sent back to nursing home with instructions for  pt to take at 3 pm today  Butch Penny C. Carroll, [facility] [address] [place], [place] [address] [phone]

## 2017-09-02 NOTE — Patient Instructions (Signed)
Your physician has recommended you make the following change in your medication:  1)Stop lasix or furosemide 2)Start Torsemide 20mg  twice a day -- start today at 3pm  Call on Tuesday

## 2017-09-02 NOTE — Telephone Encounter (Signed)
Spoke with daughter and pt has had no change in symptoms with increase in lasix  Weight is actually at 201.8 and lasix was to be decreased back to original dose Per daughter was told pt's dry weight is 195 .Discussed with Dr Angelena Form and pt needs to go to er for eval per Dr Angelena Form Daughter called and was given recommendations daughter asked if pt could be seen in afib clinic to avoid er place call to [place] [facility] and [place]  Stated could see pt today if could arrive before 11:30 appt made and pt's daughter notified ./cy

## 2017-09-02 NOTE — Telephone Encounter (Signed)
lmtcb to reschedule 12/10 appt for 12/14 due to the office closing for inclement weather.

## 2017-09-02 NOTE — Telephone Encounter (Signed)
New message     lori increased her lasik and it has not reduced the swelling any.   Pt c/o swelling: STAT is pt has developed SOB within 24 hours  1) How much weight have you gained and in what time span?  Maybe a pound at most , but has not lost any  2) If swelling, where is the swelling located? Legs and abdomin  3) Are you currently taking a fluid pill? yes  4) Are you currently SOB? A little, not in distress   5) Do you have a log of your daily weights (if so, list)?  Yes 200 on Monday and she is 201.2 this morning   6) Have you gained 3 pounds in a day or 5 pounds in a week?  no  7) Have you traveled recently? This has been the same since she was released from hospital, pt has not traveled per daughter she is in assisted living ( [facility] in [place]) fax [phone]

## 2017-09-07 ENCOUNTER — Ambulatory Visit (HOSPITAL_COMMUNITY)
Admission: RE | Admit: 2017-09-07 | Discharge: 2017-09-07 | Disposition: A | Discharge status: Home / Self Care | Payer: Medicare Other | Source: Ambulatory Visit | Attending: Nurse Practitioner | Admitting: Nurse Practitioner

## 2017-09-07 ENCOUNTER — Encounter (HOSPITAL_COMMUNITY): Payer: Self-pay | Admitting: Nurse Practitioner

## 2017-09-07 ENCOUNTER — Telehealth: Payer: Self-pay | Admitting: *Deleted

## 2017-09-07 VITALS — BP 96/62 | HR 80 | Ht 61.0 in | Wt 196.0 lb

## 2017-09-07 DIAGNOSIS — K5790 Diverticulosis of intestine, part unspecified, without perforation or abscess without bleeding: Secondary | ICD-10-CM | POA: Insufficient documentation

## 2017-09-07 DIAGNOSIS — K4090 Unilateral inguinal hernia, without obstruction or gangrene, not specified as recurrent: Secondary | ICD-10-CM | POA: Insufficient documentation

## 2017-09-07 DIAGNOSIS — Z79899 Other long term (current) drug therapy: Secondary | ICD-10-CM | POA: Insufficient documentation

## 2017-09-07 DIAGNOSIS — G2581 Restless legs syndrome: Secondary | ICD-10-CM | POA: Diagnosis not present

## 2017-09-07 DIAGNOSIS — I482 Chronic atrial fibrillation: Secondary | ICD-10-CM | POA: Diagnosis not present

## 2017-09-07 DIAGNOSIS — Z7901 Long term (current) use of anticoagulants: Secondary | ICD-10-CM | POA: Diagnosis not present

## 2017-09-07 DIAGNOSIS — E785 Hyperlipidemia, unspecified: Secondary | ICD-10-CM | POA: Insufficient documentation

## 2017-09-07 DIAGNOSIS — K8080 Other cholelithiasis without obstruction: Secondary | ICD-10-CM | POA: Insufficient documentation

## 2017-09-07 DIAGNOSIS — I110 Hypertensive heart disease with heart failure: Secondary | ICD-10-CM | POA: Diagnosis not present

## 2017-09-07 DIAGNOSIS — I509 Heart failure, unspecified: Secondary | ICD-10-CM

## 2017-09-07 DIAGNOSIS — R109 Unspecified abdominal pain: Secondary | ICD-10-CM | POA: Diagnosis not present

## 2017-09-07 DIAGNOSIS — G4733 Obstructive sleep apnea (adult) (pediatric): Secondary | ICD-10-CM | POA: Diagnosis not present

## 2017-09-07 DIAGNOSIS — M545 Low back pain: Secondary | ICD-10-CM | POA: Diagnosis not present

## 2017-09-07 DIAGNOSIS — R609 Edema, unspecified: Secondary | ICD-10-CM | POA: Diagnosis not present

## 2017-09-07 DIAGNOSIS — I5020 Unspecified systolic (congestive) heart failure: Secondary | ICD-10-CM | POA: Insufficient documentation

## 2017-09-07 DIAGNOSIS — R262 Difficulty in walking, not elsewhere classified: Secondary | ICD-10-CM | POA: Diagnosis not present

## 2017-09-07 DIAGNOSIS — R2681 Unsteadiness on feet: Secondary | ICD-10-CM | POA: Diagnosis not present

## 2017-09-07 DIAGNOSIS — Z9181 History of falling: Secondary | ICD-10-CM | POA: Diagnosis not present

## 2017-09-07 LAB — BASIC METABOLIC PANEL
Anion gap: 11 (ref 5–15)
BUN: 26 mg/dL — AB (ref 6–20)
CO2: 28 mmol/L (ref 22–32)
Calcium: 9.4 mg/dL (ref 8.9–10.3)
Chloride: 98 mmol/L — ABNORMAL LOW (ref 101–111)
Creatinine, Ser: 1.45 mg/dL — ABNORMAL HIGH (ref 0.44–1.00)
GFR calc Af Amer: 38 mL/min — ABNORMAL LOW (ref >60)
GFR, EST NON AFRICAN AMERICAN: 33 mL/min — AB (ref >60)
GLUCOSE: 105 mg/dL — AB (ref 65–99)
POTASSIUM: 4 mmol/L (ref 3.5–5.1)
Sodium: 137 mmol/L (ref 135–145)

## 2017-09-07 LAB — BRAIN NATRIURETIC PEPTIDE: B Natriuretic Peptide: 235.8 pg/mL — ABNORMAL HIGH (ref 0.0–100.0)

## 2017-09-07 MED ORDER — DILTIAZEM HCL ER COATED BEADS 180 MG PO CP24: 180.0000 mg | ORAL_CAPSULE | Freq: Every day | ORAL | 3 refills | Status: AC

## 2017-09-07 NOTE — Progress Notes (Signed)
Primary Care Physician: [name], MD Referring Physician: Dr. Drucie Ip Rehm is a 83 y.o. female with a h/o chronic Afib,with refractory v rates to rate control, diastolic dysfunction, edema, that is being seen as an urgent visit, 12/7, as daughter called [address] this am stating that mother is still not feeling well and continuing to retain fluid. She was seen by Truitt Merle, NP, this past Monday as f/u to the hospital 11/27 to 11/30, treated  for abdominal and back pain as well as fluid gain. No etiology for her pain was found.  CT scan of the abdomen which showed gallstones and right inguinal hernia without incarceration, with diffuse diverticulosis without diverticulitis. She was given intravenous Lasix. General surgery was called by the ED provider. Hospitalist service was called to evaluate the patient  LE DVU was ruled out with U/S. HIDA scan was normal. Lower extremity swelling improved with decrease of diltiazem and start of metoprolol. Rate remained controlled. IV lasix was given and converted back to home oral dose at discharge. Her abdominal pain had improved slightly but appears consistent with musculoskeletal pain for which she is treated by orthopedics.  She was on 80 mg of lasix for a few days but was then reduced to 60 mg daily.  In the afib clinic, 12/7, her weight is 201 lbs, up one lb from when seen on 12/5. She has been on 80 mg lasix since Monday and went to 60 mg this am. She was d/c on 40 mg lasix daily from the hospital. She feels very bloated and sore in her abdomen, but is not having the acute abdominal/back pain as she was last week. Felt the need to sleep in recliner last night for the shortness of breath. Afib is rate controlled.  F/u in afib clinic, 12/12, on last visit, it was felt that pt was refractory to lasix and was changed to Demadex 20 mg bid. She is now 5 lbs lighter but is still having abdominal discomfort. She is still sleeping in  the recliner. Once she gets to sleep, she is able to sleep well. Most of her discomfort is around the LUQ/flank area. Worked up in recent hospital stay without any significant findings to explain pain. Afib rate controlled, BP soft with change in diuretic.PO 95 %. LEE looks improved.  Today, she denies symptoms of palpitations, chest pain,  , orthopnea, PND,   dizziness, presyncope, syncope, or neurologic sequela. + for LLE, improved, shortness of breath and abdominal discomfort. The patient is tolerating medications without difficulties and is otherwise without complaint today.   Past Medical History:  Diagnosis Date  . Arthritis   . Diastolic dysfunction    preserved EF,  CHF In setting of afib previously  . History of blood transfusion   . Hyperlipidemia   . Hypertension   . Incontinence of urine   . OSA (obstructive sleep apnea)    does not use CPAP  . Osteoporosis   . Persistent atrial fibrillation (HCC)    failed on Flecainide, Tikosyn, and amiodarone  . Renal calculi   . Restless legs   . Rheumatic fever    age 35  . Systolic heart failure (HCC)    EF 30 to 35% per echo 08/2013,  subsequently has normalized   Past Surgical History:  Procedure Laterality Date  . ABDOMINAL HYSTERECTOMY    . BREAST SURGERY     benign biopsy  . CARDIOVERSION N/A 09/10/2013   Procedure: CARDIOVERSION;  Surgeon:  Thompson Grayer, MD;  Location: [facility];  Service: Cardiovascular;  Laterality: N/A;  . CARDIOVERSION N/A 11/16/2013   Procedure: CARDIOVERSION;  Surgeon: Sueanne Margarita, MD;  Location: [facility] ENDOSCOPY;  Service: Cardiovascular;  Laterality: N/A;  . CARDIOVERSION N/A 08/18/2015   Procedure: CARDIOVERSION;  Surgeon: Sueanne Margarita, MD;  Location: [facility] ENDOSCOPY;  Service: Cardiovascular;  Laterality: N/A;  . CARDIOVERSION N/A 03/07/2017   Procedure: CARDIOVERSION;  Surgeon: Dorothy Spark, MD;  Location: [facility] ENDOSCOPY;  Service: Cardiovascular;  Laterality: N/A;  . CARDIOVERSION N/A 05/13/2017    Procedure: CARDIOVERSION;  Surgeon: Lelon Perla, MD;  Location: [facility] ENDOSCOPY;  Service: Cardiovascular;  Laterality: N/A;  . IR RADIOLOGIST EVAL & MGMT  02/04/2017  . IR VERTEBROPLASTY LUMBAR BX INC UNI/BIL INC/INJECT/IMAGING  02/10/2017  . [place] removed  . REPLACEMENT TOTAL KNEE  2005  . SPINE SURGERY     laminectomy 1965, spinal fusion with rod 2005  . TEE WITHOUT CARDIOVERSION N/A 03/07/2017   Procedure: TRANSESOPHAGEAL ECHOCARDIOGRAM (TEE);  Surgeon: Dorothy Spark, MD;  Location: [facility] ENDOSCOPY;  Service: Cardiovascular;  Laterality: N/A;  . TEE WITHOUT CARDIOVERSION N/A 05/13/2017   Procedure: TRANSESOPHAGEAL ECHOCARDIOGRAM (TEE);  Surgeon: Lelon Perla, MD;  Location: [facility] ENDOSCOPY;  Service: Cardiovascular;  Laterality: N/A;  . TONSILLECTOMY      Current Outpatient Medications  Medication Sig Dispense Refill  . acetaminophen (TYLENOL) 500 MG tablet Take 500 mg 2 (two) times daily by mouth.    . diazepam (VALIUM) 2 MG tablet Take 1 tablet (2 mg total) by mouth at bedtime as needed for muscle spasms. 5 tablet 0  . diltiazem (CARDIZEM CD) 240 MG 24 hr capsule Take 1 capsule (240 mg total) by mouth daily. 30 capsule 0  . docusate sodium (COLACE) 100 MG capsule Take 100 mg by mouth daily.     Marland Kitchen donepezil (ARICEPT ODT) 10 MG disintegrating tablet Take 1 tablet (10 mg total) by mouth at bedtime. 90 tablet 2  . escitalopram (LEXAPRO) 10 MG tablet TAKE 1 TABLET EVERY DAY (Patient taking differently: Take 10 mg by mouth once a day) 90 tablet 1  . gabapentin (NEURONTIN) 100 MG capsule Take 100 mg by mouth at bedtime.    Marland Kitchen HYDROcodone-acetaminophen (NORCO) 7.5-325 MG tablet Take 1 tablet by mouth See admin instructions. 1 tablet three times a day and may take an additional 2 doses as needed for nighttime pain between 2000 and 0800 15 tablet 0  . Magnesium 500 MG TABS Take 500 mg by mouth at bedtime.    . Melatonin 5 MG TABS Take 5 mg at bedtime by mouth.     .  memantine (NAMENDA) 10 MG tablet Take 10 mg 2 (two) times daily by mouth.    . metoprolol succinate (TOPROL-XL) 25 MG 24 hr tablet Take 1 tablet (25 mg total) by mouth daily. 30 tablet 0  . Misc. Devices (ROLLER [place]) MISC Used as Directed. 1 each 0  . Multiple Vitamins-Minerals (PRESERVISION AREDS) CAPS Take 1 capsule by mouth 2 (two) times daily.    . nitroGLYCERIN (NITROSTAT) 0.4 MG SL tablet Place 1 tablet (0.4 mg total) under the tongue every 5 (five) minutes as needed for chest pain. (Patient taking differently: Place 0.4 mg under the tongue every 5 (five) minutes x 3 doses as needed for chest pain. ) 25 tablet 3  . oxybutynin (DITROPAN-XL) 10 MG 24 hr tablet Take 10 mg by mouth at bedtime.   11  .  polyethylene glycol powder (GLYCOLAX/MIRALAX) powder Take 17 g by mouth daily. Take as needed to produce 1 normal bowel movement per day. 3350 g 1  . rOPINIRole (REQUIP) 1 MG tablet Take 1 tablet (1 mg total) by mouth See admin instructions. Take 1 tablet (1 mg) by mouth daily at 4pm and at bedtime (Patient taking differently: Take 1 mg by mouth 2 (two) times daily. ) 180 tablet 2  . spironolactone (ALDACTONE) 25 MG tablet Take 1 tablet (25 mg total) by mouth daily. 90 tablet 3  . torsemide (DEMADEX) 20 MG tablet Take 1 tablet (20 mg total) by mouth 2 (two) times daily. 60 tablet 3  . triamcinolone cream (KENALOG) 0.1 % Apply 1 application topically See admin instructions. 1 application applied topically to both breasts two times a day as needed for irritation    . trimethoprim (TRIMPEX) 100 MG tablet Take 100 mg by mouth daily.   11  . trolamine salicylate (ASPERCREME) 10 % cream Apply 1 application every 12 (twelve) hours as needed topically for muscle pain.    Marland Kitchen warfarin (COUMADIN) 2.5 MG tablet Take as directed by anticoagulation clinic. (Patient taking differently: Take 2.5 mg by mouth every evening. ) 105 tablet 1   No current facility-administered medications for this encounter.      Allergies  Allergen Reactions  . Ciprofloxacin Nausea And Vomiting  . Demerol [Meperidine] Nausea And Vomiting  . Sulfa Antibiotics Nausea And Vomiting and Other (See Comments)    And all derivatives -  GI Bleeding (?)  . Sulfacetamide Sodium Nausea And Vomiting and Other (See Comments)    GI bleeding, also (?)    Social History   Socioeconomic History  . Marital status: Divorced    Spouse name: Not on file  . Number of children: Not on file  . Years of education: Not on file  . Highest education level: Not on file  Social Needs  . Financial resource strain: Not on file  . Food insecurity - worry: Not on file  . Food insecurity - inability: Not on file  . Transportation needs - medical: Not on file  . Transportation needs - non-medical: Not on file  Occupational History  . Not on file  Tobacco Use  . Smoking status: Never Smoker  . Smokeless tobacco: Never Used  Substance and Sexual Activity  . Alcohol use: No  . Drug use: No  . Sexual activity: Not Currently  Other Topics Concern  . Not on file  Social History Narrative   Lives in [place] alone.  Spends 4-6 months per year in [place] with her daughter.   Retired Network engineer    Family History  Problem Relation Age of Onset  . Cancer Mother        breast  . Diabetes Mother   . Cancer Paternal Grandfather     ROS- All systems are reviewed and negative except as per the HPI above  Physical Exam: There were no vitals filed for this visit. Wt Readings from Last 3 Encounters:  09/02/17 201 lb 12.8 oz (91.5 kg)  08/29/17 200 lb (90.7 kg)  08/26/17 195 lb 4.8 oz (88.6 kg)    Labs: Lab Results  Component Value Date   NA 136 09/02/2017   K 4.3 09/02/2017   CL 104 09/02/2017   CO2 22 09/02/2017   GLUCOSE 93 09/02/2017   BUN 19 09/02/2017   CREATININE 1.23 (H) 09/02/2017   CALCIUM 9.3 09/02/2017   MG 2.2 08/26/2017   Lab  Results  Component Value Date   INR 2.26 08/26/2017   Lab Results  Component  Value Date   CHOL 215 (H) 12/07/2016   HDL 78 12/07/2016   LDLCALC 111 (H) 12/07/2016   TRIG 130 12/07/2016     GEN- The patient is well appearing, alert and oriented x 3 today.   Head- normocephalic, atraumatic Eyes-  Sclera clear, conjunctiva pink Ears- hearing intact Oropharynx- clear Neck- supple, no JVP Lymph- no cervical lymphadenopathy Lungs- Clear to ausculation bilaterally, normal work of breathing Heart- irregular rate and rhythm, no murmurs, rubs or gallops, PMI not laterally displaced GI- soft, NT, ND, + BS Extremities- no clubbing, cyanosis, or  2+ pedal edema, edema softer than on previous visit MS- no significant deformity or atrophy Skin- no rash or lesion Psych- euthymic mood, full affect Neuro- strength and sensation are intact  EKG-afib at 80 bpm, qrs int 80 ms, qtc 405 ms Epic records reviewed    Assessment and Plan: 1. Afib Rate controlled Decrease  Cardizem to 180 mg daily to see if will help improve edema and for soft BP with change in diuretic   Continue toprol xl 25 mg daily  2.Edema Has lost 5 lbs with start of Demadex 20 mg bid, now 195 lbs, down from 201 lbs, continue this dose, think her dry weight is closer to 189 lbs Off lasix  Continue to weigh daily Low sodium diet  Elevate LLE when sitting  Bmet/bnp today  3. Abdomen  Pain Unclear to etiology of discomfort, thought abdominal bloating from edema may be contributing but is not improved with fluid loss Worked  up with recent hospitalization without significant findings If worse go back to the ER, consider further GI evaluation as outpt  F/u with Truitt Merle, NP as scheduled 12/13  Geroge Baseman. Alejos Reinhardt, [facility] [address] [place], [place] [address] [phone]

## 2017-09-07 NOTE — Patient Instructions (Signed)
Your physician has recommended you make the following change in your medication:  Stop Cardizem 240mg   Start Cardizem 180mg  once a day

## 2017-09-07 NOTE — Telephone Encounter (Signed)
Faxing to [facility] @[phone] phone number is [phone] Orders for Doreene Adas, PA.

## 2017-09-08 ENCOUNTER — Other Ambulatory Visit (HOSPITAL_COMMUNITY): Payer: Self-pay | Admitting: *Deleted

## 2017-09-08 DIAGNOSIS — Z9181 History of falling: Secondary | ICD-10-CM | POA: Diagnosis not present

## 2017-09-08 DIAGNOSIS — M545 Low back pain: Secondary | ICD-10-CM | POA: Diagnosis not present

## 2017-09-08 DIAGNOSIS — R262 Difficulty in walking, not elsewhere classified: Secondary | ICD-10-CM | POA: Diagnosis not present

## 2017-09-08 DIAGNOSIS — R2681 Unsteadiness on feet: Secondary | ICD-10-CM | POA: Diagnosis not present

## 2017-09-08 DIAGNOSIS — R531 Weakness: Secondary | ICD-10-CM | POA: Diagnosis not present

## 2017-09-08 MED ORDER — TORSEMIDE 20 MG PO TABS: ORAL_TABLET | ORAL | 3 refills | Status: DC

## 2017-09-08 NOTE — Addendum Note (Signed)
Encounter addended by: Sherran Needs, NP on: 09/08/2017 10:27 AM  Actions taken: Result note filed

## 2017-09-12 ENCOUNTER — Other Ambulatory Visit: Payer: Self-pay

## 2017-09-12 ENCOUNTER — Encounter (HOSPITAL_COMMUNITY): Payer: Self-pay | Admitting: Emergency Medicine

## 2017-09-12 ENCOUNTER — Ambulatory Visit (INDEPENDENT_AMBULATORY_CARE_PROVIDER_SITE_OTHER): Payer: Medicare Other | Admitting: Nurse Practitioner

## 2017-09-12 ENCOUNTER — Emergency Department (HOSPITAL_COMMUNITY): Payer: Medicare Other

## 2017-09-12 ENCOUNTER — Encounter: Payer: Self-pay | Admitting: Nurse Practitioner

## 2017-09-12 ENCOUNTER — Inpatient Hospital Stay (HOSPITAL_COMMUNITY)
Admission: EM | Admit: 2017-09-12 | Discharge: 2017-09-17 | DRG: 291 | Disposition: A | Discharge status: Nursing Home (Includes ICF) | Payer: Medicare Other | Attending: Cardiology | Admitting: Cardiology

## 2017-09-12 ENCOUNTER — Observation Stay (HOSPITAL_COMMUNITY): Payer: Medicare Other

## 2017-09-12 ENCOUNTER — Ambulatory Visit (INDEPENDENT_AMBULATORY_CARE_PROVIDER_SITE_OTHER): Payer: Medicare Other | Admitting: General Practice

## 2017-09-12 ENCOUNTER — Other Ambulatory Visit: Payer: Self-pay | Admitting: Nurse Practitioner

## 2017-09-12 ENCOUNTER — Telehealth: Payer: Self-pay | Admitting: *Deleted

## 2017-09-12 VITALS — BP 102/70 | HR 98 | Ht 61.0 in | Wt 195.8 lb

## 2017-09-12 DIAGNOSIS — G2581 Restless legs syndrome: Secondary | ICD-10-CM | POA: Diagnosis present

## 2017-09-12 DIAGNOSIS — I10 Essential (primary) hypertension: Secondary | ICD-10-CM | POA: Diagnosis present

## 2017-09-12 DIAGNOSIS — F0390 Unspecified dementia without behavioral disturbance: Secondary | ICD-10-CM | POA: Diagnosis present

## 2017-09-12 DIAGNOSIS — Z882 Allergy status to sulfonamides status: Secondary | ICD-10-CM

## 2017-09-12 DIAGNOSIS — R0602 Shortness of breath: Secondary | ICD-10-CM | POA: Diagnosis not present

## 2017-09-12 DIAGNOSIS — K5900 Constipation, unspecified: Secondary | ICD-10-CM | POA: Diagnosis present

## 2017-09-12 DIAGNOSIS — R0781 Pleurodynia: Secondary | ICD-10-CM

## 2017-09-12 DIAGNOSIS — I482 Chronic atrial fibrillation: Secondary | ICD-10-CM | POA: Diagnosis present

## 2017-09-12 DIAGNOSIS — Z7901 Long term (current) use of anticoagulants: Secondary | ICD-10-CM

## 2017-09-12 DIAGNOSIS — E872 Acidosis, unspecified: Secondary | ICD-10-CM

## 2017-09-12 DIAGNOSIS — I5031 Acute diastolic (congestive) heart failure: Secondary | ICD-10-CM | POA: Diagnosis present

## 2017-09-12 DIAGNOSIS — I5043 Acute on chronic combined systolic (congestive) and diastolic (congestive) heart failure: Secondary | ICD-10-CM | POA: Diagnosis not present

## 2017-09-12 DIAGNOSIS — R1110 Vomiting, unspecified: Secondary | ICD-10-CM | POA: Diagnosis not present

## 2017-09-12 DIAGNOSIS — M810 Age-related osteoporosis without current pathological fracture: Secondary | ICD-10-CM | POA: Diagnosis present

## 2017-09-12 DIAGNOSIS — Z515 Encounter for palliative care: Secondary | ICD-10-CM

## 2017-09-12 DIAGNOSIS — I481 Persistent atrial fibrillation: Secondary | ICD-10-CM

## 2017-09-12 DIAGNOSIS — Z66 Do not resuscitate: Secondary | ICD-10-CM | POA: Diagnosis present

## 2017-09-12 DIAGNOSIS — Z881 Allergy status to other antibiotic agents status: Secondary | ICD-10-CM

## 2017-09-12 DIAGNOSIS — E785 Hyperlipidemia, unspecified: Secondary | ICD-10-CM | POA: Diagnosis present

## 2017-09-12 DIAGNOSIS — R0600 Dyspnea, unspecified: Secondary | ICD-10-CM

## 2017-09-12 DIAGNOSIS — I130 Hypertensive heart and chronic kidney disease with heart failure and stage 1 through stage 4 chronic kidney disease, or unspecified chronic kidney disease: Principal | ICD-10-CM | POA: Diagnosis present

## 2017-09-12 DIAGNOSIS — R32 Unspecified urinary incontinence: Secondary | ICD-10-CM | POA: Diagnosis present

## 2017-09-12 DIAGNOSIS — N189 Chronic kidney disease, unspecified: Secondary | ICD-10-CM | POA: Diagnosis present

## 2017-09-12 DIAGNOSIS — G4733 Obstructive sleep apnea (adult) (pediatric): Secondary | ICD-10-CM | POA: Diagnosis present

## 2017-09-12 DIAGNOSIS — I480 Paroxysmal atrial fibrillation: Secondary | ICD-10-CM | POA: Diagnosis present

## 2017-09-12 DIAGNOSIS — I4891 Unspecified atrial fibrillation: Secondary | ICD-10-CM | POA: Diagnosis present

## 2017-09-12 DIAGNOSIS — Z7189 Other specified counseling: Secondary | ICD-10-CM

## 2017-09-12 DIAGNOSIS — N183 Chronic kidney disease, stage 3 (moderate): Secondary | ICD-10-CM | POA: Diagnosis present

## 2017-09-12 DIAGNOSIS — I5033 Acute on chronic diastolic (congestive) heart failure: Secondary | ICD-10-CM | POA: Diagnosis not present

## 2017-09-12 DIAGNOSIS — I4819 Other persistent atrial fibrillation: Secondary | ICD-10-CM

## 2017-09-12 DIAGNOSIS — R1111 Vomiting without nausea: Secondary | ICD-10-CM | POA: Diagnosis not present

## 2017-09-12 DIAGNOSIS — Z885 Allergy status to narcotic agent status: Secondary | ICD-10-CM

## 2017-09-12 DIAGNOSIS — Z90710 Acquired absence of both cervix and uterus: Secondary | ICD-10-CM

## 2017-09-12 DIAGNOSIS — I5032 Chronic diastolic (congestive) heart failure: Secondary | ICD-10-CM

## 2017-09-12 DIAGNOSIS — Z79899 Other long term (current) drug therapy: Secondary | ICD-10-CM

## 2017-09-12 DIAGNOSIS — R4189 Other symptoms and signs involving cognitive functions and awareness: Secondary | ICD-10-CM | POA: Diagnosis present

## 2017-09-12 DIAGNOSIS — M1990 Unspecified osteoarthritis, unspecified site: Secondary | ICD-10-CM | POA: Diagnosis present

## 2017-09-12 DIAGNOSIS — Z87442 Personal history of urinary calculi: Secondary | ICD-10-CM

## 2017-09-12 DIAGNOSIS — R109 Unspecified abdominal pain: Secondary | ICD-10-CM | POA: Diagnosis not present

## 2017-09-12 HISTORY — DX: Chronic kidney disease, unspecified: N18.9

## 2017-09-12 LAB — CBC WITH DIFFERENTIAL/PLATELET
BASOS PCT: 0 %
Basophils Absolute: 0 10*3/uL (ref 0.0–0.1)
Basophils Absolute: 0 10*3/uL (ref 0.0–0.1)
Basophils Relative: 1 %
Eosinophils Absolute: 0.1 10*3/uL (ref 0.0–0.7)
Eosinophils Absolute: 0.1 10*3/uL (ref 0.0–0.7)
Eosinophils Relative: 1 %
Eosinophils Relative: 1 %
HCT: 39.1 % (ref 36.0–46.0)
HEMATOCRIT: 42.2 % (ref 36.0–46.0)
HEMOGLOBIN: 14.2 g/dL (ref 12.0–15.0)
Hemoglobin: 13.1 g/dL (ref 12.0–15.0)
LYMPHS PCT: 23 %
Lymphocytes Relative: 24 %
Lymphs Abs: 1.7 10*3/uL (ref 0.7–4.0)
Lymphs Abs: 1.8 10*3/uL (ref 0.7–4.0)
MCH: 33 pg (ref 26.0–34.0)
MCH: 32.6 pg (ref 26.0–34.0)
MCHC: 33.6 g/dL (ref 30.0–36.0)
MCHC: 33.5 g/dL (ref 30.0–36.0)
MCV: 98.1 fL (ref 78.0–100.0)
MCV: 97.3 fL (ref 78.0–100.0)
MONO ABS: 0.5 10*3/uL (ref 0.1–1.0)
MONOS PCT: 7 %
Monocytes Absolute: 0.7 10*3/uL (ref 0.1–1.0)
Monocytes Relative: 10 %
NEUTROS ABS: 5.2 10*3/uL (ref 1.7–7.7)
NEUTROS PCT: 69 %
Neutro Abs: 4.7 10*3/uL (ref 1.7–7.7)
Neutrophils Relative %: 64 %
Platelets: 197 10*3/uL (ref 150–400)
Platelets: 207 10*3/uL (ref 150–400)
RBC: 4.3 MIL/uL (ref 3.87–5.11)
RBC: 4.02 MIL/uL (ref 3.87–5.11)
RDW: 14.6 % (ref 11.5–15.5)
RDW: 14.3 % (ref 11.5–15.5)
WBC: 7.6 10*3/uL (ref 4.0–10.5)
WBC: 7.3 10*3/uL (ref 4.0–10.5)

## 2017-09-12 LAB — COMPREHENSIVE METABOLIC PANEL
ALBUMIN: 3.6 g/dL (ref 3.5–5.0)
ALK PHOS: 130 U/L — AB (ref 38–126)
ALT: 18 U/L (ref 14–54)
ALT: 18 U/L (ref 14–54)
ANION GAP: 15 (ref 5–15)
AST: 29 U/L (ref 15–41)
AST: 29 U/L (ref 15–41)
Albumin: 3.4 g/dL — ABNORMAL LOW (ref 3.5–5.0)
Alkaline Phosphatase: 123 U/L (ref 38–126)
Anion gap: 14 (ref 5–15)
BILIRUBIN TOTAL: 1.2 mg/dL (ref 0.3–1.2)
BUN: 28 mg/dL — AB (ref 6–20)
BUN: 27 mg/dL — ABNORMAL HIGH (ref 6–20)
CALCIUM: 9.6 mg/dL (ref 8.9–10.3)
CO2: 24 mmol/L (ref 22–32)
CO2: 24 mmol/L (ref 22–32)
Calcium: 9.4 mg/dL (ref 8.9–10.3)
Chloride: 100 mmol/L — ABNORMAL LOW (ref 101–111)
Chloride: 101 mmol/L (ref 101–111)
Creatinine, Ser: 1.72 mg/dL — ABNORMAL HIGH (ref 0.44–1.00)
Creatinine, Ser: 1.69 mg/dL — ABNORMAL HIGH (ref 0.44–1.00)
GFR calc Af Amer: 31 mL/min — ABNORMAL LOW (ref >60)
GFR calc Af Amer: 31 mL/min — ABNORMAL LOW (ref >60)
GFR calc non Af Amer: 27 mL/min — ABNORMAL LOW (ref >60)
GFR, EST NON AFRICAN AMERICAN: 26 mL/min — AB (ref >60)
GLUCOSE: 90 mg/dL (ref 65–99)
Glucose, Bld: 80 mg/dL (ref 65–99)
POTASSIUM: 3.6 mmol/L (ref 3.5–5.1)
Potassium: 3.4 mmol/L — ABNORMAL LOW (ref 3.5–5.1)
Sodium: 139 mmol/L (ref 135–145)
Sodium: 139 mmol/L (ref 135–145)
TOTAL PROTEIN: 6.6 g/dL (ref 6.5–8.1)
Total Bilirubin: 0.9 mg/dL (ref 0.3–1.2)
Total Protein: 6.1 g/dL — ABNORMAL LOW (ref 6.5–8.1)

## 2017-09-12 LAB — APTT: aPTT: 42 seconds — ABNORMAL HIGH (ref 24–36)

## 2017-09-12 LAB — TROPONIN I
Troponin I: <0.03 ng/mL (ref <0.03)
Troponin I: <0.03 ng/mL (ref <0.03)
Troponin I: <0.03 ng/mL (ref <0.03)

## 2017-09-12 LAB — PROTIME-INR
INR: 2.39
INR: 2.3
Prothrombin Time: 25.9 seconds — ABNORMAL HIGH (ref 11.4–15.2)
Prothrombin Time: 25.1 seconds — ABNORMAL HIGH (ref 11.4–15.2)

## 2017-09-12 LAB — BRAIN NATRIURETIC PEPTIDE
B NATRIURETIC PEPTIDE 5: 241 pg/mL — AB (ref 0.0–100.0)
B Natriuretic Peptide: 232 pg/mL — ABNORMAL HIGH (ref 0.0–100.0)

## 2017-09-12 LAB — I-STAT CG4 LACTIC ACID, ED: Lactic Acid, Venous: 2.39 mmol/L (ref 0.5–1.9)

## 2017-09-12 LAB — MAGNESIUM: Magnesium: 2.5 mg/dL — ABNORMAL HIGH (ref 1.7–2.4)

## 2017-09-12 MED ORDER — DIAZEPAM 2 MG PO TABS
2.0000 mg | ORAL_TABLET | Freq: Every evening | ORAL | Status: DC | PRN
  Administered 2017-09-12: 2 mg via ORAL
  Filled 2017-09-12: qty 1

## 2017-09-12 MED ORDER — POLYETHYLENE GLYCOL 3350 17 G PO PACK
17.0000 g | PACK | Freq: Every day | ORAL | Status: DC
  Administered 2017-09-13 – 2017-09-16 (×4): 17 g via ORAL
  Filled 2017-09-12 (×6): qty 1

## 2017-09-12 MED ORDER — DONEPEZIL HCL 10 MG PO TABS
10.0000 mg | ORAL_TABLET | Freq: Every day | ORAL | Status: DC
  Administered 2017-09-12 – 2017-09-16 (×5): 10 mg via ORAL
  Filled 2017-09-12 (×6): qty 1

## 2017-09-12 MED ORDER — MEMANTINE HCL 10 MG PO TABS
10.0000 mg | ORAL_TABLET | Freq: Two times a day (BID) | ORAL | Status: DC
  Administered 2017-09-12 – 2017-09-17 (×10): 10 mg via ORAL
  Filled 2017-09-12 (×10): qty 1

## 2017-09-12 MED ORDER — ESCITALOPRAM OXALATE 10 MG PO TABS
10.0000 mg | ORAL_TABLET | Freq: Every day | ORAL | Status: DC
  Administered 2017-09-13 – 2017-09-17 (×5): 10 mg via ORAL
  Filled 2017-09-12 (×5): qty 1

## 2017-09-12 MED ORDER — HYDROCODONE-ACETAMINOPHEN 7.5-325 MG PO TABS
1.0000 | ORAL_TABLET | Freq: Three times a day (TID) | ORAL | Status: DC
  Administered 2017-09-12 – 2017-09-16 (×11): 1 via ORAL
  Filled 2017-09-12 (×11): qty 1

## 2017-09-12 MED ORDER — SODIUM CHLORIDE 0.9% FLUSH: 3.0000 mL | INTRAVENOUS | Status: DC | PRN

## 2017-09-12 MED ORDER — WARFARIN - PHARMACIST DOSING INPATIENT: Freq: Every day | Status: DC

## 2017-09-12 MED ORDER — ACETAMINOPHEN 500 MG PO TABS
500.0000 mg | ORAL_TABLET | Freq: Two times a day (BID) | ORAL | Status: DC
  Administered 2017-09-12 – 2017-09-13 (×2): 500 mg via ORAL
  Filled 2017-09-12 (×2): qty 1

## 2017-09-12 MED ORDER — SODIUM CHLORIDE 0.9 % IV SOLN: 250.0000 mL | INTRAVENOUS | Status: DC | PRN

## 2017-09-12 MED ORDER — METOPROLOL SUCCINATE ER 25 MG PO TB24
25.0000 mg | ORAL_TABLET | Freq: Every day | ORAL | Status: DC
  Administered 2017-09-13 – 2017-09-17 (×5): 25 mg via ORAL
  Filled 2017-09-12 (×6): qty 1

## 2017-09-12 MED ORDER — MAGNESIUM OXIDE 400 (241.3 MG) MG PO TABS
400.0000 mg | ORAL_TABLET | Freq: Every day | ORAL | Status: DC
  Administered 2017-09-13 – 2017-09-17 (×5): 400 mg via ORAL
  Filled 2017-09-12 (×5): qty 1

## 2017-09-12 MED ORDER — DOCUSATE SODIUM 100 MG PO CAPS
100.0000 mg | ORAL_CAPSULE | Freq: Every day | ORAL | Status: DC
  Administered 2017-09-13 – 2017-09-17 (×5): 100 mg via ORAL
  Filled 2017-09-12 (×5): qty 1

## 2017-09-12 MED ORDER — TRIMETHOPRIM 100 MG PO TABS
100.0000 mg | ORAL_TABLET | Freq: Every day | ORAL | Status: DC
  Administered 2017-09-13 – 2017-09-17 (×5): 100 mg via ORAL
  Filled 2017-09-12 (×5): qty 1

## 2017-09-12 MED ORDER — HYDROCODONE-ACETAMINOPHEN 5-325 MG PO TABS
1.0000 | ORAL_TABLET | Freq: Once | ORAL | Status: AC
  Administered 2017-09-12: 1 via ORAL
  Filled 2017-09-12: qty 1

## 2017-09-12 MED ORDER — SPIRONOLACTONE 25 MG PO TABS
25.0000 mg | ORAL_TABLET | Freq: Every day | ORAL | Status: DC
  Administered 2017-09-13 – 2017-09-17 (×5): 25 mg via ORAL
  Filled 2017-09-12 (×6): qty 1

## 2017-09-12 MED ORDER — GABAPENTIN 100 MG PO CAPS
100.0000 mg | ORAL_CAPSULE | Freq: Every day | ORAL | Status: DC
  Administered 2017-09-12 – 2017-09-14 (×3): 100 mg via ORAL
  Filled 2017-09-12 (×3): qty 1

## 2017-09-12 MED ORDER — DILTIAZEM HCL ER COATED BEADS 180 MG PO CP24
180.0000 mg | ORAL_CAPSULE | Freq: Every day | ORAL | Status: DC
  Administered 2017-09-13 – 2017-09-17 (×5): 180 mg via ORAL
  Filled 2017-09-12 (×5): qty 1

## 2017-09-12 MED ORDER — OXYBUTYNIN CHLORIDE ER 10 MG PO TB24
10.0000 mg | ORAL_TABLET | Freq: Every day | ORAL | Status: DC
  Administered 2017-09-12 – 2017-09-16 (×5): 10 mg via ORAL
  Filled 2017-09-12 (×5): qty 1

## 2017-09-12 MED ORDER — ROPINIROLE HCL 1 MG PO TABS
1.0000 mg | ORAL_TABLET | ORAL | Status: DC
  Administered 2017-09-12 – 2017-09-16 (×9): 1 mg via ORAL
  Filled 2017-09-12 (×10): qty 1

## 2017-09-12 MED ORDER — FUROSEMIDE 10 MG/ML IJ SOLN
60.0000 mg | Freq: Two times a day (BID) | INTRAMUSCULAR | Status: DC
  Administered 2017-09-12 – 2017-09-14 (×4): 60 mg via INTRAVENOUS
  Filled 2017-09-12 (×4): qty 6

## 2017-09-12 MED ORDER — SODIUM CHLORIDE 0.9% FLUSH
3.0000 mL | Freq: Two times a day (BID) | INTRAVENOUS | Status: DC
  Administered 2017-09-12 – 2017-09-16 (×7): 3 mL via INTRAVENOUS

## 2017-09-12 MED ORDER — WARFARIN SODIUM 2.5 MG PO TABS
2.5000 mg | ORAL_TABLET | Freq: Once | ORAL | Status: AC
  Administered 2017-09-12: 2.5 mg via ORAL
  Filled 2017-09-12: qty 1

## 2017-09-12 MED ORDER — ONDANSETRON HCL 4 MG/2ML IJ SOLN: 4.0000 mg | Freq: Four times a day (QID) | INTRAMUSCULAR | Status: DC | PRN

## 2017-09-12 NOTE — ED Provider Notes (Signed)
[facility] EMERGENCY DEPARTMENT Provider Note   CSN: [id number] Arrival date & time: 09/12/17  1253     History   Chief Complaint Chief Complaint  Patient presents with  . Abdominal Pain    HPI Angel Meyer is a 83 y.o. female.  HPI   83 year old female with past medical history of diastolic heart failure, A. fib, hypertension, hyperlipidemia here from cardiology clinic for evaluation of abdominal pain and edema.  The patient has mild dementia but is otherwise able to provide history.  She reports that for the last 2-3 days, she has had an aching, throbbing, epigastric and bilateral upper quadrant pain.  This radiates towards her back.  It is associated with early satiety.  She denies any blood in her stool or change in her stool habits.  She has not been constipated.  She has also noticed increased bilateral lower extremity edema as well as swelling of her abdomen.  She was seen in cardiology clinic today and noted to have diffuse anasarca.  She was subsequently sent to the ED for IV diuresis and admission.  She denies any current shortness of breath at rest, though she does get severely dyspneic with any kind of lying flat.  Past Medical History:  Diagnosis Date  . Arthritis   . Diastolic dysfunction    preserved EF,  CHF In setting of afib previously  . History of blood transfusion   . Hyperlipidemia   . Hypertension   . Incontinence of urine   . OSA (obstructive sleep apnea)    does not use CPAP  . Osteoporosis   . Persistent atrial fibrillation (HCC)    failed on Flecainide, Tikosyn, and amiodarone  . Renal calculi   . Restless legs   . Rheumatic fever    age 44  . Systolic heart failure (HCC)    EF 30 to 35% per echo 08/2013,  subsequently has normalized    Patient Active Problem List   Diagnosis Date Noted  . Acute diastolic heart failure ([place]) 09/12/2017  . Abdominal pain 08/23/2017  . Cholelithiasis 08/23/2017  . Acute on chronic diastolic  CHF (congestive heart failure) ([place]) 08/23/2017  . CHF exacerbation ([place]) 08/23/2017  . Long term (current) use of anticoagulants 06/08/2017  . Lichen simplex chronicus 04/29/2017  . Risk for falls 12/17/2015  . Urinary urgency 10/29/2014  . Cognitive impairment 07/18/2014  . Encounter for therapeutic drug monitoring 10/18/2013  . Lung nodule 10/03/2013  . CHF (congestive heart failure) ([place]) 09/08/2013  . Cough 08/07/2013  . Low blood pressure reading 08/07/2013  . Cerumen impaction 08/07/2013  . Anticoagulant long-term use 07/27/2013  . Acute upper respiratory infections of unspecified site 07/27/2013  . Wheezy bronchitis 07/27/2013  . OSA (obstructive sleep apnea) 12/20/2011  . Hypertension 12/20/2011  . Hyperlipidemia 12/20/2011  . Osteoarthritis 12/20/2011  . History of kidney stones 12/20/2011  . Osteoporosis 12/20/2011  . Urinary incontinence, urge 12/20/2011  . Restless leg syndrome 12/20/2011  . Atrial fibrillation ([place]) 12/20/2011    Past Surgical History:  Procedure Laterality Date  . ABDOMINAL HYSTERECTOMY    . BREAST SURGERY     benign biopsy  . CARDIOVERSION N/A 09/10/2013   Procedure: CARDIOVERSION;  Surgeon: Thompson Grayer, MD;  Location: [facility];  Service: Cardiovascular;  Laterality: N/A;  . CARDIOVERSION N/A 11/16/2013   Procedure: CARDIOVERSION;  Surgeon: Sueanne Margarita, MD;  Location: [facility] ENDOSCOPY;  Service: Cardiovascular;  Laterality: N/A;  . CARDIOVERSION N/A 08/18/2015   Procedure: CARDIOVERSION;  Surgeon: Sueanne Margarita, MD;  Location: [facility] ENDOSCOPY;  Service: Cardiovascular;  Laterality: N/A;  . CARDIOVERSION N/A 03/07/2017   Procedure: CARDIOVERSION;  Surgeon: Dorothy Spark, MD;  Location: [facility] ENDOSCOPY;  Service: Cardiovascular;  Laterality: N/A;  . CARDIOVERSION N/A 05/13/2017   Procedure: CARDIOVERSION;  Surgeon: Lelon Perla, MD;  Location: [facility] ENDOSCOPY;  Service: Cardiovascular;  Laterality: N/A;  . IR RADIOLOGIST EVAL & MGMT  02/04/2017  .  IR VERTEBROPLASTY LUMBAR BX INC UNI/BIL INC/INJECT/IMAGING  02/10/2017  . [place] removed  . REPLACEMENT TOTAL KNEE  2005  . SPINE SURGERY     laminectomy 1965, spinal fusion with rod 2005  . TEE WITHOUT CARDIOVERSION N/A 03/07/2017   Procedure: TRANSESOPHAGEAL ECHOCARDIOGRAM (TEE);  Surgeon: Dorothy Spark, MD;  Location: [facility] ENDOSCOPY;  Service: Cardiovascular;  Laterality: N/A;  . TEE WITHOUT CARDIOVERSION N/A 05/13/2017   Procedure: TRANSESOPHAGEAL ECHOCARDIOGRAM (TEE);  Surgeon: Lelon Perla, MD;  Location: [facility] ENDOSCOPY;  Service: Cardiovascular;  Laterality: N/A;  . TONSILLECTOMY      OB History    No data available       Home Medications    Prior to Admission medications   Medication Sig Start Date End Date Taking? Authorizing Provider  acetaminophen (TYLENOL) 500 MG tablet Take 500 mg by mouth 2 (two) times daily.   Yes [provider]  diazepam (VALIUM) 2 MG tablet Take 1 tablet (2 mg total) by mouth at bedtime as needed for muscle spasms. 08/26/17  Yes Patrecia Pour, MD  diltiazem (CARDIZEM CD) 180 MG 24 hr capsule Take 1 capsule (180 mg total) by mouth daily. 09/07/17  Yes Sherran Needs, NP  diltiazem (CARDIZEM) 30 MG tablet Take 30 mg by mouth every 6 (six) hours as needed (A-FIB - hold and call MD Pulse >120).   Yes [provider]  donepezil (ARICEPT ODT) 10 MG disintegrating tablet Take 1 tablet (10 mg total) by mouth at bedtime. 06/29/16  Yes Burchette, Alinda Sierras, MD  escitalopram (LEXAPRO) 10 MG tablet TAKE 1 TABLET EVERY DAY Patient taking differently: Take 10 mg by mouth once daily 03/08/17  Yes Burchette, Alinda Sierras, MD  gabapentin (NEURONTIN) 100 MG capsule Take 100 mg by mouth at bedtime.   Yes [provider]  HYDROcodone-acetaminophen (NORCO) 7.5-325 MG tablet Take 1 tablet by mouth See admin instructions. 1 tablet three times a day and may take an additional 2 doses as needed for nighttime pain between 2000 and  0800 Patient taking differently: Take 1 tablet by mouth 3 (three) times daily. May take additional two doses as needed for nighttime pain between 2000-0800 08/26/17  Yes Patrecia Pour, MD  Magnesium 500 MG TABS Take 500 mg by mouth at bedtime.   Yes [provider]  Melatonin 5 MG TABS Take 5 mg at bedtime by mouth.    Yes [provider]  memantine (NAMENDA) 10 MG tablet Take 10 mg 2 (two) times daily by mouth.   Yes [provider]  metoprolol succinate (TOPROL-XL) 25 MG 24 hr tablet Take 1 tablet (25 mg total) by mouth daily. 08/27/17  Yes Patrecia Pour, MD  Misc. Devices (ROLLER [place]) MISC Used as Directed. 01/12/16  Yes Burchette, Alinda Sierras, MD  Multiple Vitamins-Minerals (PRESERVISION AREDS) CAPS Take 1 capsule by mouth 2 (two) times daily.   Yes [provider]  nitroGLYCERIN (NITROSTAT) 0.4 MG SL tablet Place 1 tablet (0.4 mg total) under the tongue  every 5 (five) minutes as needed for chest pain. Patient taking differently: Place 0.4 mg under the tongue every 5 (five) minutes x 3 doses as needed for chest pain.  12/07/16  Yes Burtis Junes, NP  oxybutynin (DITROPAN-XL) 10 MG 24 hr tablet Take 10 mg by mouth at bedtime.  05/09/15  Yes [provider]  polyethylene glycol powder (GLYCOLAX/MIRALAX) powder Take 17 g by mouth daily. Take as needed to produce 1 normal bowel movement per day. 08/26/17  Yes Patrecia Pour, MD  rOPINIRole (REQUIP) 1 MG tablet Take 1 tablet (1 mg total) by mouth See admin instructions. Take 1 tablet (1 mg) by mouth daily at 4pm and at bedtime Patient taking differently: Take 1 mg by mouth 2 (two) times daily.  06/29/16  Yes Burchette, Alinda Sierras, MD  sennosides-docusate sodium (SENOKOT-S) 8.6-50 MG tablet Take 1 tablet by mouth daily.   Yes [provider]  spironolactone (ALDACTONE) 25 MG tablet Take 1 tablet (25 mg total) by mouth daily. 12/07/16  Yes Burtis Junes, NP  torsemide (DEMADEX) 20 MG tablet Take 1 tablet  by mouth in the morning and 1/2 tablet in the evening Patient taking differently: Take 20 mg by mouth 2 (two) times daily. Take 1 tablet by mouth in the morning and 1/2 tablet in the evening 09/08/17  Yes Sherran Needs, NP  triamcinolone cream (KENALOG) 0.1 % Apply 1 application topically See admin instructions. 1 application applied topically to both breasts two times a day as needed for irritation   Yes [provider]  trimethoprim (TRIMPEX) 100 MG tablet Take 100 mg by mouth daily.  05/18/15  Yes [provider]  trolamine salicylate (ASPERCREME) 10 % cream Apply 1 application every 12 (twelve) hours as needed topically for muscle pain.   Yes [provider]  warfarin (COUMADIN) 2.5 MG tablet Take as directed by anticoagulation clinic. Patient taking differently: Take 2.5 mg by mouth every evening.  01/25/17  Yes Burchette, Alinda Sierras, MD    Family History Family History  Problem Relation Age of Onset  . Cancer Mother        breast  . Diabetes Mother   . Cancer Paternal Grandfather     Social History Social History   Tobacco Use  . Smoking status: Never Smoker  . Smokeless tobacco: Never Used  Substance Use Topics  . Alcohol use: No  . Drug use: No     Allergies   Ciprofloxacin; Demerol [meperidine]; Sulfa antibiotics; and Sulfacetamide sodium   Review of Systems Review of Systems  Respiratory: Positive for cough and shortness of breath.   Cardiovascular: Positive for leg swelling.  Gastrointestinal: Positive for abdominal pain and nausea.  Neurological: Positive for weakness.  All other systems reviewed and are negative.    Physical Exam Updated Vital Signs BP 116/82   Pulse 98   Temp (!) 97.4 F (36.3 C) (Oral)   Resp (!) 24   Ht 5\' 1"  (1.549 m)   Wt 88.5 kg (195 lb)   SpO2 98%   BMI 36.84 kg/m   Physical Exam  Constitutional: She is oriented to person, place, and time. She appears well-developed and well-nourished. She appears  distressed.  HENT:  Head: Normocephalic and atraumatic.  Eyes: Conjunctivae are normal.  Neck: Neck supple.  Cardiovascular: Normal rate, regular rhythm and normal heart sounds. Exam reveals no friction rub.  No murmur heard. Pulmonary/Chest: Effort normal and breath sounds normal. No respiratory distress. She has no wheezes.  She has no rales.  Abdominal: Bowel sounds are normal. She exhibits no distension. There is generalized tenderness and tenderness in the epigastric area. There is no rigidity, no rebound and no guarding.  Musculoskeletal: She exhibits no edema.  Neurological: She is alert and oriented to person, place, and time. She exhibits normal muscle tone.  Skin: Skin is warm. Capillary refill takes less than 2 seconds.  Psychiatric: She has a normal mood and affect.  Nursing note and vitals reviewed.    ED Treatments / Results  Labs (all labs ordered are listed, but only abnormal results are displayed) Labs Reviewed  COMPREHENSIVE METABOLIC PANEL - Abnormal; Notable for the following components:      Result Value   Chloride 100 (*)    BUN 28 (*)    Creatinine, Ser 1.72 (*)    Alkaline Phosphatase 130 (*)    GFR calc non Af Amer 26 (*)    GFR calc Af Amer 31 (*)    All other components within normal limits  BRAIN NATRIURETIC PEPTIDE - Abnormal; Notable for the following components:   B Natriuretic Peptide 241.0 (*)    All other components within normal limits  PROTIME-INR - Abnormal; Notable for the following components:   Prothrombin Time 25.9 (*)    All other components within normal limits  I-STAT CG4 LACTIC ACID, ED - Abnormal; Notable for the following components:   Lactic Acid, Venous 2.39 (*)    All other components within normal limits  CBC WITH DIFFERENTIAL/PLATELET  TROPONIN I  I-STAT CG4 LACTIC ACID, ED    EKG  EKG Interpretation  Date/Time:  Monday September 12 2017 13:04:40 EST Ventricular Rate:  102 PR Interval:    QRS Duration: 87 QT  Interval:  380 QTC Calculation: 495 R Axis:   55 Text Interpretation:  Atrial fibrillation Low voltage, precordial leads Probable anteroseptal infarct, old Minimal ST depression, anterolateral leads Since last EKG, rate has increased Otherwise no significant change Confirmed by Duffy Bruce [id number]) on 09/12/2017 1:08:27 PM       Radiology Dg Chest 2 View  Result Date: 09/12/2017 CLINICAL DATA:  Rib pain, shortness of breath. EXAM: CHEST  2 VIEW COMPARISON:  Radiograph of August 23, 2017. FINDINGS: Stable cardiomegaly. No pneumothorax or significant pleural effusion is noted. Status post right shoulder arthroplasty. Right lung is clear. Stable left basilar curvilinear densities are noted most consistent with scarring, subsegmental atelectasis or less likely inflammation. IMPRESSION: Stable left basilar densities are noted most consistent with scarring, subsegmental atelectasis or less likely inflammation. Electronically Signed   By: Marijo Conception, M.D.   On: 09/12/2017 14:02    Procedures Procedures (including critical care time)  Medications Ordered in ED Medications  HYDROcodone-acetaminophen (NORCO/VICODIN) 5-325 MG per tablet 1 tablet (1 tablet Oral Given 09/12/17 1446)     Initial Impression / Assessment and Plan / ED Course  I have reviewed the triage vital signs and the nursing notes.  Pertinent labs & imaging results that were available during my care of the patient were reviewed by me and considered in my medical decision making (see chart for details).     83 year old female sent here by cardiology clinic for evaluation of anasarca and ongoing abdominal pain.  Patient was just admitted for abdominal pain and had negative workup.  She is on chronic pain medication for this.  Her current pain is similar to her usual pain, though mildly worse.  Her abdomen is soft and nontender on my exam.  She just had recent CT imaging for this.  Given normal white blood cell count with  reassuring exam, will elect to treat supportively, though if symptoms persist, may benefit from possible occult mesenteric ischemic evaluation.  Otherwise, cardiology has already seen her in the clinic and plans to direct admit her.  I feel this is reasonable.  She does appear overtly fluid overloaded.  Lactic acid mildly elevated.  I suspect this is multifactorial and possibly secondary to poor p.o. intake in the setting of her chronic abdominal pain.  However, given that she does have ongoing abdominal pain, I think it is reasonable to obtain a CT non-con.  She may benefit from further evaluation for mesenteric ischemia with ultrasound as indicated.  She cannot receive contrast due to AK I.  I notified the cards master who will notify the primary team to follow-up on this.  She is otherwise hemodynamically stable.  Given her reassuring vitals and exam, I feel it is reasonable to obtain the CT and continue admission to floor.  Final Clinical Impressions(s) / ED Diagnoses   Final diagnoses:  Acute on chronic diastolic heart failure ([place])  Lactic acidosis    ED Discharge Orders    None       Duffy Bruce, MD 09/12/17 1551

## 2017-09-12 NOTE — ED Notes (Addendum)
Pt ambulated to the bathroom and back to room pt did well with occasional 3seconds stops

## 2017-09-12 NOTE — Telephone Encounter (Signed)
Faxed to [facility] pt's office note. Fax # is [phone] phone # is [phone]. Pt did get admitted today.

## 2017-09-12 NOTE — Patient Instructions (Addendum)
Pre visit review using our clinic review tool, if applicable. No additional management support is needed unless otherwise documented below in the visit note.  Hold coumadin today and then continue to take 1 (2.5 mg tablet) daily.  Re-check in 4 weeks. Fax orders to [facility] Fax # is [phone] - Fax to Carla Drape or Ivin Booty. Copy of written instructions with recheck appointment also given to daughter.

## 2017-09-12 NOTE — Progress Notes (Signed)
CARDIOLOGY OFFICE NOTE  Date:  09/12/2017    Angel Meyer Date of Birth: Feb 11, 1933 Medical Record #[id number]  PCP:  [name], MD  Cardiologist:  [place]    Chief Complaint  Patient presents with  . Congestive Heart Failure    Follow up visit - seen for Dr. Rayann Heman    History of Present Illness: Angel Meyer is a 83 y.o. female who presents today for a follow up visit. Seen for Dr. Rayann Heman.   Patient with dementia and not able to provide a reliable history.   She has PAF, rheumatic fever at age 81, HTN, HLD, OA, incontinence, RLS, cognitive impairment and OSA. She has had a lung nodule that was followed by pulmonary which resolved. She failed on Flecainide and Tikosyn and was onlow doseamiodarone. Therapy has been limited by her HR. She was previously from [place] and has now moved here with her daughter. She has had some progressive memory issues. She remains on coumadin anticoagulation.   Saw me in [facility] 2016- found to be back in AF - was symptomatic - sent her for a cardioversion and back to EP. Amiodarone increased short term.   Saw Dr. Rayann Heman in [facility] 2016- poor candidate for ablation -and they have elected for conservative therapy. She has had her dose of amiodarone cut back and subsequently stopped.   I last saw her back in March of 2018 - depressed. Very sedentary. More memory issues. Last seen by Dr. Rayann Heman back in October - noted difficulty in controlling her HR - to consider AV nodal ablation.   Seen in the AF clinic on 11/26 - was short of breath and having swelling. Also with back pain. Weight was up.   Admitted the next day with abdominal pain/shortness of breath - found to have gallstones and right inguinal hernia without incarceration with diffuse diverticulosis without diverticulitis. HIDA scan normal. Her CCB was cut back due to swelling/weight gain. Toprol was added.   She was given IV diuretic.   I then saw her at the  request of her daughter - having progressive swelling. Still with belly pain. She was previously doing pretty well and was actually walking 3 times a day - up until November 17th. Unclear as to the exact etiology for her demise. She had been started on Gabapentin about the middle of November. Lots of constipation - to the point she has been impacted. We diuresed her. Added Miralax daily.   Saw Roderic Palau, NP - actually twice last week - having more issues with swelling - changed to Demadex.   Comes in today. Here with her daughter. Daughter is quite upset. Daughter provides the history due to patient not being able to give adequate/accurate history due to dementia. Swelling persists. Notes mild improvement but now "worse than ever". Unclear what her dry weight is. Persistent belly pain as well - too the point she cannot sit down while in the exam room. She is still not able to lie down in her bed. Short of breath at rest - worse with any exertion. Unable to put shoes on other than sandals. Feels miserable. Short of breath at rest. Coumadin was checked earlier today - 3.1 reported.   Past Medical History:  Diagnosis Date  . Arthritis   . Diastolic dysfunction    preserved EF,  CHF In setting of afib previously  . History of blood transfusion   . Hyperlipidemia   . Hypertension   . Incontinence of urine   .  OSA (obstructive sleep apnea)    does not use CPAP  . Osteoporosis   . Persistent atrial fibrillation (HCC)    failed on Flecainide, Tikosyn, and amiodarone  . Renal calculi   . Restless legs   . Rheumatic fever    age 36  . Systolic heart failure (HCC)    EF 30 to 35% per echo 08/2013,  subsequently has normalized    Past Surgical History:  Procedure Laterality Date  . ABDOMINAL HYSTERECTOMY    . BREAST SURGERY     benign biopsy  . CARDIOVERSION N/A 09/10/2013   Procedure: CARDIOVERSION;  Surgeon: Thompson Grayer, MD;  Location: [facility];  Service: Cardiovascular;  Laterality: N/A;    . CARDIOVERSION N/A 11/16/2013   Procedure: CARDIOVERSION;  Surgeon: Sueanne Margarita, MD;  Location: [facility] ENDOSCOPY;  Service: Cardiovascular;  Laterality: N/A;  . CARDIOVERSION N/A 08/18/2015   Procedure: CARDIOVERSION;  Surgeon: Sueanne Margarita, MD;  Location: [facility] ENDOSCOPY;  Service: Cardiovascular;  Laterality: N/A;  . CARDIOVERSION N/A 03/07/2017   Procedure: CARDIOVERSION;  Surgeon: Dorothy Spark, MD;  Location: [facility] ENDOSCOPY;  Service: Cardiovascular;  Laterality: N/A;  . CARDIOVERSION N/A 05/13/2017   Procedure: CARDIOVERSION;  Surgeon: Lelon Perla, MD;  Location: [facility] ENDOSCOPY;  Service: Cardiovascular;  Laterality: N/A;  . IR RADIOLOGIST EVAL & MGMT  02/04/2017  . IR VERTEBROPLASTY LUMBAR BX INC UNI/BIL INC/INJECT/IMAGING  02/10/2017  . [place] removed  . REPLACEMENT TOTAL KNEE  2005  . SPINE SURGERY     laminectomy 1965, spinal fusion with rod 2005  . TEE WITHOUT CARDIOVERSION N/A 03/07/2017   Procedure: TRANSESOPHAGEAL ECHOCARDIOGRAM (TEE);  Surgeon: Dorothy Spark, MD;  Location: [facility] ENDOSCOPY;  Service: Cardiovascular;  Laterality: N/A;  . TEE WITHOUT CARDIOVERSION N/A 05/13/2017   Procedure: TRANSESOPHAGEAL ECHOCARDIOGRAM (TEE);  Surgeon: Lelon Perla, MD;  Location: [facility] ENDOSCOPY;  Service: Cardiovascular;  Laterality: N/A;  . TONSILLECTOMY       Medications: Current Meds  Medication Sig  . acetaminophen (TYLENOL) 500 MG tablet Take 500 mg 2 (two) times daily by mouth.  . diltiazem (CARDIZEM CD) 180 MG 24 hr capsule Take 1 capsule (180 mg total) by mouth daily.  Marland Kitchen donepezil (ARICEPT ODT) 10 MG disintegrating tablet Take 1 tablet (10 mg total) by mouth at bedtime.  Marland Kitchen escitalopram (LEXAPRO) 10 MG tablet TAKE 1 TABLET EVERY DAY (Patient taking differently: Take 10 mg by mouth once a day)  . gabapentin (NEURONTIN) 100 MG capsule Take 100 mg by mouth at bedtime.  Marland Kitchen HYDROcodone-acetaminophen (NORCO) 7.5-325 MG tablet Take 1 tablet by mouth See  admin instructions. 1 tablet three times a day and may take an additional 2 doses as needed for nighttime pain between 2000 and 0800  . Magnesium 500 MG TABS Take 500 mg by mouth at bedtime.  . Melatonin 5 MG TABS Take 5 mg at bedtime by mouth.   . memantine (NAMENDA) 10 MG tablet Take 10 mg 2 (two) times daily by mouth.  . metoprolol succinate (TOPROL-XL) 25 MG 24 hr tablet Take 1 tablet (25 mg total) by mouth daily.  . Misc. Devices (ROLLER [place]) MISC Used as Directed.  . Multiple Vitamins-Minerals (PRESERVISION AREDS) CAPS Take 1 capsule by mouth 2 (two) times daily.  . nitroGLYCERIN (NITROSTAT) 0.4 MG SL tablet Place 1 tablet (0.4 mg total) under the tongue every 5 (five) minutes as needed for chest pain. (Patient taking differently: Place 0.4 mg under the tongue every  5 (five) minutes x 3 doses as needed for chest pain. )  . oxybutynin (DITROPAN-XL) 10 MG 24 hr tablet Take 10 mg by mouth at bedtime.   . polyethylene glycol powder (GLYCOLAX/MIRALAX) powder Take 17 g by mouth daily. Take as needed to produce 1 normal bowel movement per day.  Marland Kitchen rOPINIRole (REQUIP) 1 MG tablet Take 1 tablet (1 mg total) by mouth See admin instructions. Take 1 tablet (1 mg) by mouth daily at 4pm and at bedtime (Patient taking differently: Take 1 mg by mouth 2 (two) times daily. )  . spironolactone (ALDACTONE) 25 MG tablet Take 1 tablet (25 mg total) by mouth daily.  Marland Kitchen torsemide (DEMADEX) 20 MG tablet Take 1 tablet by mouth in the morning and 1/2 tablet in the evening  . triamcinolone cream (KENALOG) 0.1 % Apply 1 application topically See admin instructions. 1 application applied topically to both breasts two times a day as needed for irritation  . trimethoprim (TRIMPEX) 100 MG tablet Take 100 mg by mouth daily.   Marland Kitchen trolamine salicylate (ASPERCREME) 10 % cream Apply 1 application every 12 (twelve) hours as needed topically for muscle pain.  Marland Kitchen warfarin (COUMADIN) 2.5 MG tablet Take as directed by anticoagulation  clinic. (Patient taking differently: Take 2.5 mg by mouth every evening. )     Allergies: Allergies  Allergen Reactions  . Ciprofloxacin Nausea And Vomiting  . Demerol [Meperidine] Nausea And Vomiting  . Sulfa Antibiotics Nausea And Vomiting and Other (See Comments)    And all derivatives -  GI Bleeding (?)  . Sulfacetamide Sodium Nausea And Vomiting and Other (See Comments)    GI bleeding, also (?)    Social History: The patient  reports that  has never smoked. she has never used smokeless tobacco. She reports that she does not drink alcohol or use drugs.   Family History: The patient's family history includes Cancer in her mother and paternal grandfather; Diabetes in her mother.   Review of Systems: Please see the history of present illness.   Otherwise, the review of systems is positive for none.   All other systems are reviewed and negative.   Physical Exam: VS:  BP 102/70 (BP Location: Left Arm, Patient Position: Sitting, Cuff Size: Large)   Pulse 98   Ht 5\' 1"  (1.549 m)   Wt 195 lb 12.8 oz (88.8 kg)   BMI 37.00 kg/m  .  BMI Body mass index is 37 kg/m.  Wt Readings from Last 3 Encounters:  09/12/17 195 lb 12.8 oz (88.8 kg)  09/07/17 196 lb (88.9 kg)  09/02/17 201 lb 12.8 oz (91.5 kg)    General: Acutely ill - having to stand up and lean against wheelchair due to belly pain. Short of breath at rest.   HEENT: Normal.  Neck: Supple, no real JVD that I can appreciate.   Cardiac: Irregular irregular rhythm. Rate is a little fast.  Massive swelling in her legs.   Respiratory:  Lungs are fairly clear to auscultation bilaterally with normal work of breathing.  GI: Soft and tender on exam. Does not seem overly distended but notes diffuse tenderness/soreness.  MS: No deformity or atrophy. Gait not tested.  Skin: Warm and dry. Color is normal.  Neuro:  Strength and sensation are intact and no gross focal deficits noted.  Psych: Alert, appropriate and with normal  affect.   LABORATORY DATA:  EKG:  EKG is ordered today. This shows AF with VR of 98 today.   Lab Results  Component Value Date   WBC 7.5 09/02/2017   HGB 13.3 09/02/2017   HCT 39.8 09/02/2017   PLT 183 09/02/2017   GLUCOSE 105 (H) 09/07/2017   CHOL 215 (H) 12/07/2016   TRIG 130 12/07/2016   HDL 78 12/07/2016   LDLCALC 111 (H) 12/07/2016   ALT 19 09/02/2017   AST 26 09/02/2017   NA 137 09/07/2017   K 4.0 09/07/2017   CL 98 (L) 09/07/2017   CREATININE 1.45 (H) 09/07/2017   BUN 26 (H) 09/07/2017   CO2 28 09/07/2017   TSH 2.902 09/02/2017   INR 2.26 08/26/2017   Lab Results  Component Value Date   INR 2.26 08/26/2017   INR 2.39 08/25/2017   INR 2.02 08/24/2017     BNP (last 3 results) Recent Labs    08/23/17 1603 09/02/17 1100 09/07/17 1503  BNP 136.9* 258.1* 235.8*    ProBNP (last 3 results) No results for input(s): PROBNP in the last 8760 hours.   Other Studies Reviewed Today:  Echo Study Conclusions 08/25/2017  - Left ventricle: The cavity size was normal. Wall thickness was increased in a pattern of mild LVH. Systolic function was normal. The estimated ejection fraction was in the range of 50% to 55%. - Mitral valve: There was mild regurgitation. - Left atrium: The atrium was mildly dilated. - Pericardium, extracardiac: A trivial pericardial effusion was identified.   CT ABDOMEN IMPRESSION 08/23/2017: 1. Cholelithiasis without CT findings for acute cholecystitis. 2. Right inguinal hernia containing the cecum. No obvious obstruction/incarceration. 3. Diffuse colonic diverticulosis without findings for acute diverticulitis. 4. Advanced vascular disease.    CT CHEST IMPRESSION 08/13/2017: 1. No CT evidence of pulmonary embolism. 2. Small left pleural effusion and focal left lung base round atelectasis. Clinical correlation and follow-up recommended. 3. Cardiomegaly with coronary vascular calcification. 4. Aortic Atherosclerosis  (ICD10-I70.0).   Electronically Signed By: Anner Crete M.D. On: 08/13/2017 03:32   Myoview Impression from February 2015 Exercise Capacity: [place] with no exercise.  BP Response: Hypotensive blood pressure response.  Clinical Symptoms: There is chest heaviness.  ECG Impression: No significant ST segment change suggestive of ischemia.  Comparison with Prior Nuclear Study: No images to compare  Overall Impression: Normal stress nuclear study.  LV Ejection Fraction: Study not gated. LV Wall Motion: NA  Kirk Ruths   Assessment/Plan:  1. Acute on chronic diastolic congestive heart failure: unclear as to what her actual dry weight is - she was 189 in October but has been almost 200# back in the summer. Now with shortness of breath at rest - massive edema in the legs. She is on less CCB therapy now. Endorses abdominal bloating. Daughter does not feel the outpatient therapy is working - requesting admission - I agree. Will need IV diuresis.  Most recent echo with normal EF. Overall prognosis tenuous. Discussed with Dr. Rayann Heman here in the office and he is in agreement as well. Transferred by EMS. [place] aware.   2. Abdominal pain - gallstones had negative HIDA - she is on chronic narcotics - probably has some degree of constipation as well. She has pretty significant abdominal pain - ?mesenteric ischemia??? May need GI to see or general surgery to see again. May need CT angio to further define. Hopefully she will improve with IV diuresis - will need to follow.   3. Cholelithiasis: CT abdomen showed cholelithiasis without cholecystitis along with diffuse diverticulosis and right inguinal hernia.HIDA was normal. May need general surgery to see again - but she  still looks pretty deconditioned at this time. Off statin.   4. Chronic atrial fibrillation on coumadin: Historically difficult to control rate - they did not wish to proceed with AV nodal ablation/PPM - her HR  was much better earlier this month - now somewhat elevated - ?related to the current illness. Would follow for now.   5. Hypertension: BP remains soft.   6. Cognitive impairment: per PCP   Current medicines are reviewed with the patient today.  The patient does not have concerns regarding medicines other than what has been noted above.  The following changes have been made:  See above.  Labs/ tests ordered today include:    Orders Placed This Encounter  Procedures  . EKG 12-Lead     Disposition:   Further disposition pending. Admitting to [facility]. Transferred by EMS.   Patient is agreeable to this plan and will call if any problems develop in the interim.   SignedTruitt Merle, NP  09/12/2017 12:04 PM  [facility] [address] [place], [place]  [address] Phone: [phone] Fax: [phone]

## 2017-09-12 NOTE — ED Triage Notes (Signed)
Pt to ED from Cardiology office with c/o ongoing abd/rib/epigastric pain, was in hospital 08/23/17 for same.  Pt is alert, oriented x4, skin w/d, abd distended, bilateral leg swelling,

## 2017-09-12 NOTE — Patient Instructions (Addendum)
We are admitting you to the hospital today   If you need a refill on your cardiac medications before your next appointment, please call your pharmacy.   Call the [facility] office at [phone] if you have any questions, problems or concerns.      

## 2017-09-12 NOTE — H&P (Signed)
Office Visit   09/12/2017 [facility]    [place], Marlane Hatcher, NP  Cardiology   Acute diastolic heart failure [facility]) +3 more  Dx   Congestive Heart Failure   ; Referred by [name], MD  Reason for Visit   Additional Documentation   Vitals:   BP 102/70 (BP Location: Left Arm, Patient Position: Sitting, Cuff Size: Large)   Pulse 98   Ht 5\' 1"  (1.549 m)   Wt 195 lb 12.8 oz (88.8 kg)   BMI 37.00 kg/m   BSA 1.95 m      More Vitals   Flowsheets:   Anthropometrics,   MEWS Score     Encounter Info:   Billing Info,   History,   Allergies,   Detailed Report     All Notes   Procedures by Burtis Junes, NP at 09/12/2017 11:30 AM   Author: Burtis Junes, NP Author Type: Nurse Practitioner Filed: 09/12/2017 12:22 PM  Note Status: Signed Cosign: Cosign Not Required Encounter Date: 09/12/2017  Editor: Sallee Provencal L        Scan on 09/12/2017 12:22 PM by Myles Rosenthal: Ekg - [facility] Scan on 09/12/2017 12:22 PM by Sallee Provencal L: Ekg - [facility]   Progress Notes by Burtis Junes, NP at 09/12/2017 11:30 AM   Author: Burtis Junes, NP Author Type: Nurse Practitioner Filed: 09/12/2017 12:16 PM  Note Status: Signed Cosign: Cosign Not Required Encounter Date: 09/12/2017  Editor: Burtis Junes, NP (Nurse Practitioner)  Expand All Collapse All       CARDIOLOGY OFFICE NOTE  Date:  09/12/2017    Angel Meyer Date of Birth: [birth year] Medical Record #[id number]  PCP:  [name], MD           Cardiologist:  Serv[facility]        Chief Complaint  Patient presents with  . Congestive Heart Failure    Follow up visit - seen for Dr. Rayann Heman    History of Present Illness: Angel Meyer is a 83 y.o. female who presents today for a follow up visit. Seen for Dr. Rayann Heman.   Patient with dementia and not able to provide a reliable history.   She has PAF, rheumatic fever at age 89, HTN,  HLD, OA, incontinence, RLS, cognitive impairmentand OSA. She has hada lung nodule thatwasfollowed by pulmonary which resolved. She failed on Flecainide and Tikosyn and was onlow doseamiodarone. Therapy has been limited by her HR. She was previously from [place] and has now moved here with her daughter. She has had some progressive memory issues. She remains on coumadin anticoagulation.   Saw me in [facility] 2016- found to be back in AF - was symptomatic - sent her for a cardioversion and back to EP. Amiodarone increased short term.   Saw Dr. Rayann Heman in [facility] 2016- poor candidate for ablation -and they have elected for conservative therapy. She has had her dose of amiodarone cut backand subsequently stopped.  I last saw her back in [place]- depressed. Very sedentary. More memory issues. Last seen by Dr. Rayann Heman back in October - noted difficulty in controlling her HR - to consider AV nodal ablation.  Seen in the AF clinic on 11/26 - was short of breath and having swelling. Also with back pain. Weight was up.  Admittedthe next daywith abdominal pain/shortness of breath - found to have gallstones and right inguinal hernia without incarceration with diffuse diverticulosis without diverticulitis. HIDA  scan normal. Her CCB was cut backdue to swelling/weight gain. Toprol was added.She was given IV diuretic.   I then saw her at the request of her daughter - having progressive swelling. Still with belly pain. She was previously doing pretty well and was actually walking 3 times a day - up until November 17th. Unclear as to the exact etiology for her demise. She had been started on Gabapentin about the middle of November. Lots of constipation - to the point she has been impacted. We diuresed her. Added Miralax daily.   Saw Roderic Palau, NP - actually twice last week - having more issues with swelling - changed to Demadex.   Comes in today. Here with her daughter. Daughter  is quite upset. Daughter provides the history due to patient not being able to give adequate/accurate history due to dementia. Swelling persists. Notes mild improvement but now "worse than ever". Unclear what her dry weight is. Persistent belly pain as well - too the point she cannot sit down while in the exam room. She is still not able to lie down in her bed. Short of breath at rest - worse with any exertion. Unable to put shoes on other than sandals. Feels miserable. Short of breath at rest. Coumadin was checked earlier today - 3.1 reported.       Past Medical History:  Diagnosis Date  . Arthritis   . Diastolic dysfunction    preserved EF,  CHF In setting of afib previously  . History of blood transfusion   . Hyperlipidemia   . Hypertension   . Incontinence of urine   . OSA (obstructive sleep apnea)    does not use CPAP  . Osteoporosis   . Persistent atrial fibrillation (HCC)    failed on Flecainide, Tikosyn, and amiodarone  . Renal calculi   . Restless legs   . Rheumatic fever    age 89  . Systolic heart failure (HCC)    EF 30 to 35% per echo 08/2013,  subsequently has normalized         Past Surgical History:  Procedure Laterality Date  . ABDOMINAL HYSTERECTOMY    . BREAST SURGERY     benign biopsy  . CARDIOVERSION N/A 09/10/2013   Procedure: CARDIOVERSION;  Surgeon: Thompson Grayer, MD;  Location: [place];  Service: Cardiovascular;  Laterality: N/A;  . CARDIOVERSION N/A 11/16/2013   Procedure: CARDIOVERSION;  Surgeon: Sueanne Margarita, MD;  Location: [facility] ENDOSCOPY;  Service: Cardiovascular;  Laterality: N/A;  . CARDIOVERSION N/A 08/18/2015   Procedure: CARDIOVERSION;  Surgeon: Sueanne Margarita, MD;  Location: [facility] ENDOSCOPY;  Service: Cardiovascular;  Laterality: N/A;  . CARDIOVERSION N/A 03/07/2017   Procedure: CARDIOVERSION;  Surgeon: Dorothy Spark, MD;  Location: [facility] ENDOSCOPY;  Service: Cardiovascular;  Laterality: N/A;  . CARDIOVERSION N/A  05/13/2017   Procedure: CARDIOVERSION;  Surgeon: Lelon Perla, MD;  Location: [facility] ENDOSCOPY;  Service: Cardiovascular;  Laterality: N/A;  . IR RADIOLOGIST EVAL & MGMT  02/04/2017  . IR VERTEBROPLASTY LUMBAR BX INC UNI/BIL INC/INJECT/IMAGING  02/10/2017  . Cannon removed  . REPLACEMENT TOTAL KNEE  2005  . SPINE SURGERY     laminectomy 1965, spinal fusion with rod 2005  . TEE WITHOUT CARDIOVERSION N/A 03/07/2017   Procedure: TRANSESOPHAGEAL ECHOCARDIOGRAM (TEE);  Surgeon: Dorothy Spark, MD;  Location: [facility] ENDOSCOPY;  Service: Cardiovascular;  Laterality: N/A;  . TEE WITHOUT CARDIOVERSION N/A 05/13/2017   Procedure: TRANSESOPHAGEAL ECHOCARDIOGRAM (TEE);  Surgeon:  Lelon Perla, MD;  Location: [facility] ENDOSCOPY;  Service: Cardiovascular;  Laterality: N/A;  . TONSILLECTOMY       Medications: ActiveMedications      Current Meds  Medication Sig  . acetaminophen (TYLENOL) 500 MG tablet Take 500 mg 2 (two) times daily by mouth.  . diltiazem (CARDIZEM CD) 180 MG 24 hr capsule Take 1 capsule (180 mg total) by mouth daily.  Marland Kitchen donepezil (ARICEPT ODT) 10 MG disintegrating tablet Take 1 tablet (10 mg total) by mouth at bedtime.  Marland Kitchen escitalopram (LEXAPRO) 10 MG tablet TAKE 1 TABLET EVERY DAY (Patient taking differently: Take 10 mg by mouth once a day)  . gabapentin (NEURONTIN) 100 MG capsule Take 100 mg by mouth at bedtime.  Marland Kitchen HYDROcodone-acetaminophen (NORCO) 7.5-325 MG tablet Take 1 tablet by mouth See admin instructions. 1 tablet three times a day and may take an additional 2 doses as needed for nighttime pain between 2000 and 0800  . Magnesium 500 MG TABS Take 500 mg by mouth at bedtime.  . Melatonin 5 MG TABS Take 5 mg at bedtime by mouth.   . memantine (NAMENDA) 10 MG tablet Take 10 mg 2 (two) times daily by mouth.  . metoprolol succinate (TOPROL-XL) 25 MG 24 hr tablet Take 1 tablet (25 mg total) by mouth daily.  . Misc. Devices (ROLLER [place]) MISC  Used as Directed.  . Multiple Vitamins-Minerals (PRESERVISION AREDS) CAPS Take 1 capsule by mouth 2 (two) times daily.  . nitroGLYCERIN (NITROSTAT) 0.4 MG SL tablet Place 1 tablet (0.4 mg total) under the tongue every 5 (five) minutes as needed for chest pain. (Patient taking differently: Place 0.4 mg under the tongue every 5 (five) minutes x 3 doses as needed for chest pain. )  . oxybutynin (DITROPAN-XL) 10 MG 24 hr tablet Take 10 mg by mouth at bedtime.   . polyethylene glycol powder (GLYCOLAX/MIRALAX) powder Take 17 g by mouth daily. Take as needed to produce 1 normal bowel movement per day.  Marland Kitchen rOPINIRole (REQUIP) 1 MG tablet Take 1 tablet (1 mg total) by mouth See admin instructions. Take 1 tablet (1 mg) by mouth daily at 4pm and at bedtime (Patient taking differently: Take 1 mg by mouth 2 (two) times daily. )  . spironolactone (ALDACTONE) 25 MG tablet Take 1 tablet (25 mg total) by mouth daily.  Marland Kitchen torsemide (DEMADEX) 20 MG tablet Take 1 tablet by mouth in the morning and 1/2 tablet in the evening  . triamcinolone cream (KENALOG) 0.1 % Apply 1 application topically See admin instructions. 1 application applied topically to both breasts two times a day as needed for irritation  . trimethoprim (TRIMPEX) 100 MG tablet Take 100 mg by mouth daily.   Marland Kitchen trolamine salicylate (ASPERCREME) 10 % cream Apply 1 application every 12 (twelve) hours as needed topically for muscle pain.  Marland Kitchen warfarin (COUMADIN) 2.5 MG tablet Take as directed by anticoagulation clinic. (Patient taking differently: Take 2.5 mg by mouth every evening. )       Allergies:      Allergies  Allergen Reactions  . Ciprofloxacin Nausea And Vomiting  . Demerol [Meperidine] Nausea And Vomiting  . Sulfa Antibiotics Nausea And Vomiting and Other (See Comments)    And all derivatives -  GI Bleeding (?)  . Sulfacetamide Sodium Nausea And Vomiting and Other (See Comments)    GI bleeding, also (?)    Social History: The patient   reports that  has never smoked. she has never used smokeless tobacco.  She reports that she does not drink alcohol or use drugs.   Family History: The patient's family history includes Cancer in her mother and paternal grandfather; Diabetes in her mother.   Review of Systems: Please see the history of present illness.   Otherwise, the review of systems is positive for none.   All other systems are reviewed and negative.   Physical Exam: VS:  BP 102/70 (BP Location: Left Arm, Patient Position: Sitting, Cuff Size: Large)   Pulse 98   Ht 5\' 1"  (1.549 m)   Wt 195 lb 12.8 oz (88.8 kg)   BMI 37.00 kg/m  .  BMI Body mass index is 37 kg/m.     Wt Readings from Last 3 Encounters:  09/12/17 195 lb 12.8 oz (88.8 kg)  09/07/17 196 lb (88.9 kg)  09/02/17 201 lb 12.8 oz (91.5 kg)    General: Acutely ill - having to stand up and lean against wheelchair due to belly pain. Short of breath at rest.   HEENT: Normal.  Neck: Supple, no real JVD that I can appreciate.   Cardiac: Irregular irregular rhythm. Rate is a little fast.  Massive swelling in her legs.   Respiratory:  Lungs are fairly clear to auscultation bilaterally with normal work of breathing.  GI: Soft and tender on exam. Does not seem overly distended but notes diffuse tenderness/soreness.  MS: No deformity or atrophy. Gait not tested.  Skin: Warm and dry. Color is normal.  Neuro:  Strength and sensation are intact and no gross focal deficits noted.  Psych: Alert, appropriate and with normal affect.   LABORATORY DATA:  EKG:  EKG is ordered today. This shows AF with VR of 98 today.   RecentLabs       Lab Results  Component Value Date   WBC 7.5 09/02/2017   HGB 13.3 09/02/2017   HCT 39.8 09/02/2017   PLT 183 09/02/2017   GLUCOSE 105 (H) 09/07/2017   CHOL 215 (H) 12/07/2016   TRIG 130 12/07/2016   HDL 78 12/07/2016   LDLCALC 111 (H) 12/07/2016   ALT 19 09/02/2017   AST 26 09/02/2017   NA 137 09/07/2017    K 4.0 09/07/2017   CL 98 (L) 09/07/2017   CREATININE 1.45 (H) 09/07/2017   BUN 26 (H) 09/07/2017   CO2 28 09/07/2017   TSH 2.902 09/02/2017   INR 2.26 08/26/2017     RecentLabs       Lab Results  Component Value Date   INR 2.26 08/26/2017   INR 2.39 08/25/2017   INR 2.02 08/24/2017       BNP (last 3 results) RecentLabs(withinlast365days)       Recent Labs    08/23/17 1603 09/02/17 1100 09/07/17 1503  BNP 136.9* 258.1* 235.8*      ProBNP (last 3 results) RecentLabs(withinlast365days)  No results for input(s): PROBNP in the last 8760 hours.     Other Studies Reviewed Today:  EchoStudy Conclusions11/29/2018  - Left ventricle: The cavity size was normal. Wall thickness was increased in a pattern of mild LVH. Systolic function was normal. The estimated ejection fraction was in the range of 50% to 55%. - Mitral valve: There was mild regurgitation. - Left atrium: The atrium was mildly dilated. - Pericardium, extracardiac: A trivial pericardial effusion was identified.   CT ABDOMENIMPRESSION 08/23/2017: 1. Cholelithiasis without CT findings for acute cholecystitis. 2. Right inguinal hernia containing the cecum. No obvious obstruction/incarceration. 3. Diffuse colonic diverticulosis without findings for acute diverticulitis. 4. Advanced  vascular disease.    CT CHESTIMPRESSION11/17/2018: 1. No CT evidence of pulmonary embolism. 2. Small left pleural effusion and focal left lung base round atelectasis. Clinical correlation and follow-up recommended. 3. Cardiomegaly with coronary vascular calcification. 4. Aortic Atherosclerosis (ICD10-I70.0).   Electronically Signed By: Anner Crete M.D. On: 08/13/2017 03:32   Myoview Impression from February 2015 Exercise Capacity: [place] with no exercise.  BP Response: Hypotensive blood pressure response.  Clinical Symptoms: There is chest  heaviness.  ECG Impression: No significant ST segment change suggestive of ischemia.  Comparison with Prior Nuclear Study: No images to compare  Overall Impression: Normal stress nuclear study.  LV Ejection Fraction: Study not gated. LV Wall Motion: NA  Kirk Ruths   Assessment/Plan:  1.Acute on chronic diastolic congestive heart failure:unclear as to what her actual dry weight is - she was 189 in October but has been almost 200# back in the summer. Now with shortness of breath at rest - massive edema in the legs. She is on less CCB therapy now. Endorses abdominal bloating. Daughter does not feel the outpatient therapy is working - requesting admission - I agree. Will need IV diuresis.  Most recent echo with normal EF. Overall prognosis tenuous. Discussed with Dr. Rayann Heman here in the office and he is in agreement as well. Transferred by EMS. [place] aware.   2. Abdominal pain - gallstones had negative HIDA- she is on chronic narcotics - probably has some degree of constipation as well. She has pretty significant abdominal pain - ?mesenteric ischemia??? May need GI to see or general surgery to see again. May need CT angio to further define. Hopefully she will improve with IV diuresis - will need to follow.   3.Cholelithiasis: CT abdomen showed cholelithiasis without cholecystitis along with diffuse diverticulosis and right inguinal hernia.HIDA was normal. May need general surgery to see again - but she still looks pretty deconditioned at this time. Off statin.   4.Chronic atrial fibrillation on coumadin: Historically difficult to control rate- they did not wish to proceed with AV nodal ablation/PPM - her HR was much better earlier this month - now somewhat elevated - ?related to the current illness. Would follow for now.   5.Hypertension:BP remains soft.   6.Cognitive impairment:per PCP   Current medicines are reviewed with the patient today.  The patient does not  have concerns regarding medicines other than what has been noted above.  The following changes have been made:  See above.  Labs/ tests ordered today include:       Orders Placed This Encounter  Procedures  . EKG 12-Lead     Disposition:   Further disposition pending. Admitting to [facility]. Transferred by EMS.   Patient is agreeable to this plan and will call if any problems develop in the interim.   SignedTruitt Merle, NP  09/12/2017 12:04 PM  [facility] [address] [place], [place]  [address] Phone: [phone] Fax: [phone]           Patient Instructions by Burtis Junes, NP at 09/12/2017 11:30 AM   Author: Burtis Junes, NP Author Type: Nurse Practitioner Filed: 09/12/2017 12:03 PM  Note Status: Addendum Cosign: Cosign Not Required Encounter Date: 09/12/2017  Editor: Burtis Junes, NP (Nurse Practitioner)  Prior Versions: 1. Burtis Junes, NP (Nurse Practitioner) at 09/12/2017 11:35 AM - Signed    We are admitting you to the hospital today.    If you need a refill  on your cardiac medications before your next appointment, please call your pharmacy.   Call the [place] office at [phone] if you have any questions, problems or concerns.         Instructions   Patient Instructions        Communications      [facility] Provider CC Chart Rep sent to [name], MD     Chart Routed to Thompson Grayer, MD  Media   Electronic signature on 09/12/2017 11:25 AM   Communication Routing History   Recipient Method Sent by Date Sent  [name], MD In [place], Cecille Rubin C, NP 09/12/2017     Orders Placed      EKG 12-Lead  Medication Changes     None    Medication List  Visit Diagnoses      Acute diastolic heart failure ([place])    Persistent atrial fibrillation ([place])    Essential hypertension    Chronic diastolic heart  failure ([place])    Problem List  Level of Service   Level of Service  PR OFFICE OUTPATIENT VISIT 25 MINUTES [99214]  All Charges for This Encounter   Code  99214  Description: PR OFFICE OUTPATIENT VISIT 25 MINUTES  Service Date: 09/12/2017  Service Provider: Burtis Junes, NP  Qty: 1  [phone]  Description: PR CURRENT TOBACCO NON-USER  Service Date: 09/12/2017  Service Provider: Burtis Junes, NP  Qty: 1  [id number]  Description: PR ELECTROCARDIOGRAM, COMPLETE  Service Date: 09/12/2017  Service Provider: Burtis Junes, NP  Qty: 1

## 2017-09-12 NOTE — Progress Notes (Signed)
ANTICOAGULATION CONSULT NOTE - Initial Consult  Pharmacy Consult for Coumadin Indication: atrial fibrillation  Allergies  Allergen Reactions  . Ciprofloxacin Nausea And Vomiting  . Demerol [Meperidine] Nausea And Vomiting  . Sulfa Antibiotics Nausea And Vomiting and Other (See Comments)    And all derivatives -  GI Bleeding (?)  . Sulfacetamide Sodium Nausea And Vomiting and Other (See Comments)    GI bleeding, also (?)    Patient Measurements: Height: 5\' 1"  (154.9 cm) Weight: 195 lb (88.5 kg) IBW/kg (Calculated) : 47.8 Heparin Dosing Weight: n/a  Vital Signs: Temp: 97.4 F (36.3 C) (12/17 1303) Temp Source: Oral (12/17 1303) BP: 116/82 (12/17 1530) Pulse Rate: 98 (12/17 1530)  Labs: Recent Labs    09/12/17 1330  HGB 14.2  HCT 42.2  PLT 197  LABPROT 25.9*  INR 2.39  CREATININE 1.72*  TROPONINI <0.03    Estimated Creatinine Clearance: 25.5 mL/min (A) (by C-G formula based on SCr of 1.72 mg/dL (H)).   Medical History: Past Medical History:  Diagnosis Date  . Arthritis   . Diastolic dysfunction    preserved EF,  CHF In setting of afib previously  . History of blood transfusion   . Hyperlipidemia   . Hypertension   . Incontinence of urine   . OSA (obstructive sleep apnea)    does not use CPAP  . Osteoporosis   . Persistent atrial fibrillation (HCC)    failed on Flecainide, Tikosyn, and amiodarone  . Renal calculi   . Restless legs   . Rheumatic fever    age 70  . Systolic heart failure (HCC)    EF 30 to 35% per echo 08/2013,  subsequently has normalized    Medications:  Scheduled:  . acetaminophen  500 mg Oral BID  . diltiazem  180 mg Oral Daily  . docusate sodium  100 mg Oral Daily  . donepezil  10 mg Oral QHS  . [START ON 09/13/2017] escitalopram  10 mg Oral Daily  . furosemide  60 mg Intravenous BID  . gabapentin  100 mg Oral QHS  . HYDROcodone-acetaminophen  1 tablet Oral TID  . magnesium oxide  400 mg Oral Daily  . memantine  10 mg Oral  BID  . metoprolol succinate  25 mg Oral Daily  . oxybutynin  10 mg Oral QHS  . polyethylene glycol  17 g Oral Daily  . rOPINIRole  1 mg Oral 2 times per day  . sodium chloride flush  3 mL Intravenous Q12H  . spironolactone  25 mg Oral Daily  . [START ON 09/13/2017] trimethoprim  100 mg Oral Daily    Assessment: 83 yo female with afib on chronic Coumadin.  INR today at goal (2.39).  No overt bleeding or complications noted.  PTA Coumadin dose 2.5 mg daily, last dose last night.  Goal of Therapy:  INR 2-3 Monitor platelets by anticoagulation protocol: Yes   Plan:  1. Coumadin 2.5 mg x 1 tonight. 2. Daily PT/INR.  Uvaldo Rising, BCPS  Clinical Pharmacist Pager [phone]  09/12/2017 4:32 PM

## 2017-09-13 ENCOUNTER — Encounter (HOSPITAL_COMMUNITY): Payer: Self-pay

## 2017-09-13 ENCOUNTER — Observation Stay (HOSPITAL_COMMUNITY): Payer: Medicare Other

## 2017-09-13 DIAGNOSIS — R0600 Dyspnea, unspecified: Secondary | ICD-10-CM | POA: Diagnosis not present

## 2017-09-13 DIAGNOSIS — K5900 Constipation, unspecified: Secondary | ICD-10-CM | POA: Diagnosis present

## 2017-09-13 DIAGNOSIS — I5033 Acute on chronic diastolic (congestive) heart failure: Secondary | ICD-10-CM

## 2017-09-13 DIAGNOSIS — M810 Age-related osteoporosis without current pathological fracture: Secondary | ICD-10-CM | POA: Diagnosis present

## 2017-09-13 DIAGNOSIS — Z7189 Other specified counseling: Secondary | ICD-10-CM | POA: Diagnosis not present

## 2017-09-13 DIAGNOSIS — R4189 Other symptoms and signs involving cognitive functions and awareness: Secondary | ICD-10-CM | POA: Diagnosis present

## 2017-09-13 DIAGNOSIS — Z87442 Personal history of urinary calculi: Secondary | ICD-10-CM | POA: Diagnosis not present

## 2017-09-13 DIAGNOSIS — I5031 Acute diastolic (congestive) heart failure: Secondary | ICD-10-CM | POA: Diagnosis not present

## 2017-09-13 DIAGNOSIS — G2581 Restless legs syndrome: Secondary | ICD-10-CM | POA: Diagnosis present

## 2017-09-13 DIAGNOSIS — I130 Hypertensive heart and chronic kidney disease with heart failure and stage 1 through stage 4 chronic kidney disease, or unspecified chronic kidney disease: Secondary | ICD-10-CM | POA: Diagnosis present

## 2017-09-13 DIAGNOSIS — R109 Unspecified abdominal pain: Secondary | ICD-10-CM | POA: Diagnosis not present

## 2017-09-13 DIAGNOSIS — I481 Persistent atrial fibrillation: Secondary | ICD-10-CM | POA: Diagnosis present

## 2017-09-13 DIAGNOSIS — Z515 Encounter for palliative care: Secondary | ICD-10-CM | POA: Diagnosis not present

## 2017-09-13 DIAGNOSIS — E785 Hyperlipidemia, unspecified: Secondary | ICD-10-CM | POA: Diagnosis present

## 2017-09-13 DIAGNOSIS — I480 Paroxysmal atrial fibrillation: Secondary | ICD-10-CM | POA: Diagnosis present

## 2017-09-13 DIAGNOSIS — Z90710 Acquired absence of both cervix and uterus: Secondary | ICD-10-CM | POA: Diagnosis not present

## 2017-09-13 DIAGNOSIS — E872 Acidosis: Secondary | ICD-10-CM | POA: Diagnosis present

## 2017-09-13 DIAGNOSIS — Z882 Allergy status to sulfonamides status: Secondary | ICD-10-CM | POA: Diagnosis not present

## 2017-09-13 DIAGNOSIS — R0781 Pleurodynia: Secondary | ICD-10-CM | POA: Diagnosis not present

## 2017-09-13 DIAGNOSIS — I482 Chronic atrial fibrillation: Secondary | ICD-10-CM | POA: Diagnosis present

## 2017-09-13 DIAGNOSIS — R32 Unspecified urinary incontinence: Secondary | ICD-10-CM | POA: Diagnosis present

## 2017-09-13 DIAGNOSIS — M1990 Unspecified osteoarthritis, unspecified site: Secondary | ICD-10-CM | POA: Diagnosis present

## 2017-09-13 DIAGNOSIS — Z79899 Other long term (current) drug therapy: Secondary | ICD-10-CM | POA: Diagnosis not present

## 2017-09-13 DIAGNOSIS — Z885 Allergy status to narcotic agent status: Secondary | ICD-10-CM | POA: Diagnosis not present

## 2017-09-13 DIAGNOSIS — F0390 Unspecified dementia without behavioral disturbance: Secondary | ICD-10-CM | POA: Diagnosis present

## 2017-09-13 DIAGNOSIS — I5043 Acute on chronic combined systolic (congestive) and diastolic (congestive) heart failure: Secondary | ICD-10-CM | POA: Diagnosis present

## 2017-09-13 DIAGNOSIS — G4733 Obstructive sleep apnea (adult) (pediatric): Secondary | ICD-10-CM | POA: Diagnosis present

## 2017-09-13 DIAGNOSIS — Z7901 Long term (current) use of anticoagulants: Secondary | ICD-10-CM | POA: Diagnosis not present

## 2017-09-13 DIAGNOSIS — Z66 Do not resuscitate: Secondary | ICD-10-CM | POA: Diagnosis present

## 2017-09-13 DIAGNOSIS — Z881 Allergy status to other antibiotic agents status: Secondary | ICD-10-CM | POA: Diagnosis not present

## 2017-09-13 DIAGNOSIS — N183 Chronic kidney disease, stage 3 (moderate): Secondary | ICD-10-CM | POA: Diagnosis present

## 2017-09-13 DIAGNOSIS — J90 Pleural effusion, not elsewhere classified: Secondary | ICD-10-CM | POA: Diagnosis not present

## 2017-09-13 LAB — BASIC METABOLIC PANEL
Anion gap: 11 (ref 5–15)
BUN: 27 mg/dL — ABNORMAL HIGH (ref 6–20)
CO2: 24 mmol/L (ref 22–32)
Calcium: 9 mg/dL (ref 8.9–10.3)
Chloride: 104 mmol/L (ref 101–111)
Creatinine, Ser: 1.55 mg/dL — ABNORMAL HIGH (ref 0.44–1.00)
GFR calc Af Amer: 35 mL/min — ABNORMAL LOW (ref >60)
GFR calc non Af Amer: 30 mL/min — ABNORMAL LOW (ref >60)
Glucose, Bld: 81 mg/dL (ref 65–99)
Potassium: 3.5 mmol/L (ref 3.5–5.1)
Sodium: 139 mmol/L (ref 135–145)

## 2017-09-13 LAB — TROPONIN I: Troponin I: <0.03 ng/mL (ref <0.03)

## 2017-09-13 LAB — PROTIME-INR
INR: 2.2
PROTHROMBIN TIME: 24.3 s — AB (ref 11.4–15.2)

## 2017-09-13 MED ORDER — HYDROCODONE-ACETAMINOPHEN 7.5-325 MG PO TABS
1.0000 | ORAL_TABLET | Freq: Three times a day (TID) | ORAL | Status: DC | PRN
  Administered 2017-09-14 – 2017-09-16 (×4): 1 via ORAL
  Filled 2017-09-13 (×4): qty 1

## 2017-09-13 MED ORDER — ACETAMINOPHEN 500 MG PO TABS
500.0000 mg | ORAL_TABLET | Freq: Two times a day (BID) | ORAL | Status: DC
  Administered 2017-09-13 – 2017-09-14 (×2): 500 mg via ORAL
  Filled 2017-09-13 (×2): qty 1

## 2017-09-13 NOTE — Progress Notes (Signed)
ANTICOAGULATION CONSULT NOTE - Initial Consult  Pharmacy Consult for Coumadin Indication: atrial fibrillation  Allergies  Allergen Reactions  . Ciprofloxacin Nausea And Vomiting  . Demerol [Meperidine] Nausea And Vomiting  . Sulfa Antibiotics Nausea And Vomiting and Other (See Comments)    And all derivatives -  GI Bleeding (?)  . Sulfacetamide Sodium Nausea And Vomiting and Other (See Comments)    GI bleeding, also (?)   Patient Measurements: Height: 5\' 1"  (154.9 cm) Weight: 191 lb 8 oz (86.9 kg) IBW/kg (Calculated) : 47.8 Heparin Dosing Weight: n/a  Vital Signs: Temp: 98 F (36.7 C) (12/18 0655) Temp Source: Oral (12/18 0655) BP: 110/66 (12/18 0655) Pulse Rate: 100 (12/18 0655)  Labs: Recent Labs    09/12/17 1330 09/12/17 1623 09/12/17 2215 09/13/17 0315  HGB 14.2 13.1  --   --   HCT 42.2 39.1  --   --   PLT 197 207  --   --   APTT  --  42*  --   --   LABPROT 25.9* 25.1*  --  24.3*  INR 2.39 2.30  --  2.20  CREATININE 1.72* 1.69*  --  1.55*  TROPONINI <0.03 <0.03 <0.03 <0.03   Estimated Creatinine Clearance: 28 mL/min (A) (by C-G formula based on SCr of 1.55 mg/dL (H)).  Medical History: Past Medical History:  Diagnosis Date  . Arthritis   . Diastolic dysfunction    preserved EF,  CHF In setting of afib previously  . History of blood transfusion   . Hyperlipidemia   . Hypertension   . Incontinence of urine   . OSA (obstructive sleep apnea)    does not use CPAP  . Osteoporosis   . Persistent atrial fibrillation (HCC)    failed on Flecainide, Tikosyn, and amiodarone  . Renal calculi   . Restless legs   . Rheumatic fever    age 64  . Systolic heart failure (HCC)    EF 30 to 35% per echo 08/2013,  subsequently has normalized   Medications:  Scheduled:  . acetaminophen  500 mg Oral BID  . diltiazem  180 mg Oral Daily  . docusate sodium  100 mg Oral Daily  . donepezil  10 mg Oral QHS  . escitalopram  10 mg Oral Daily  . furosemide  60 mg  Intravenous BID  . gabapentin  100 mg Oral QHS  . HYDROcodone-acetaminophen  1 tablet Oral TID  . magnesium oxide  400 mg Oral Daily  . memantine  10 mg Oral BID  . metoprolol succinate  25 mg Oral Daily  . oxybutynin  10 mg Oral QHS  . polyethylene glycol  17 g Oral Daily  . rOPINIRole  1 mg Oral 2 times per day  . sodium chloride flush  3 mL Intravenous Q12H  . spironolactone  25 mg Oral Daily  . trimethoprim  100 mg Oral Daily  . Warfarin - Pharmacist Dosing Inpatient   Does not apply q1800   Assessment: 83 yo female with afib on chronic Coumadin.  INR therapeutic on admit.  No overt bleeding or complications noted.  PTA Coumadin dose 2.5 mg daily  INR therapeutic: 2.2  Goal of Therapy:  INR 2-3 Monitor platelets by anticoagulation protocol: Yes   Plan:  1. Coumadin 2.5mg  x 1 tonight. 2. Daily PT/INR. 3. Monitor for s/sx of bleeding  Georga Bora, PharmD Clinical Pharmacist 09/13/2017 8:44 AM

## 2017-09-13 NOTE — Progress Notes (Signed)
Iv infiltrated LFA iv removed.ice and heat alternated.

## 2017-09-13 NOTE — Clinical Social Work Note (Signed)
Patient is a resident at [facility]. CSW acknowledges consult regarding anticipated discharge to SNF. Please order PT evaluation if SNF is anticipated plan.  Dayton Scrape, [place]

## 2017-09-13 NOTE — Progress Notes (Signed)
Pt assisted to the bathroom using personal walker stand by assist. Updated on plan of the day. returned to bed and taken down for xray.

## 2017-09-13 NOTE — Care Management Note (Signed)
Case Management Note  Patient Details  Name: Angel Meyer MRN: [id number] Date of Birth: 1932-12-27  Subjective/Objective:   CHF                Action/Plan: Patient is a resident of [facility]; [name] SW following to assist with returning back to the facility at discharge.  Expected Discharge Date:    Possibly 09/15/2017              Expected Discharge Plan:  ALF In-House Referral:  Clinical Social Work  Discharge planning Services  CM Consult   Status of Service:  In process, will continue to follow  Sherrilyn Rist [phone] 09/13/2017, 11:42 AM

## 2017-09-13 NOTE — Progress Notes (Signed)
Progress Note  Patient Name: Angel Meyer Date of Encounter: 09/13/2017  Primary Cardiologist: Dr Rayann Heman  Subjective   Still dyspneic; no chest pain; complains of rib pain with inspiration; denies abdominal pain.  Inpatient Medications    Scheduled Meds: . acetaminophen  500 mg Oral BID  . diltiazem  180 mg Oral Daily  . docusate sodium  100 mg Oral Daily  . donepezil  10 mg Oral QHS  . escitalopram  10 mg Oral Daily  . furosemide  60 mg Intravenous BID  . gabapentin  100 mg Oral QHS  . HYDROcodone-acetaminophen  1 tablet Oral TID  . magnesium oxide  400 mg Oral Daily  . memantine  10 mg Oral BID  . metoprolol succinate  25 mg Oral Daily  . oxybutynin  10 mg Oral QHS  . polyethylene glycol  17 g Oral Daily  . rOPINIRole  1 mg Oral 2 times per day  . sodium chloride flush  3 mL Intravenous Q12H  . spironolactone  25 mg Oral Daily  . trimethoprim  100 mg Oral Daily  . Warfarin - Pharmacist Dosing Inpatient   Does not apply q1800   Continuous Infusions: . sodium chloride     PRN Meds: sodium chloride, diazepam, ondansetron (ZOFRAN) IV, sodium chloride flush   Vital Signs    Vitals:   09/12/17 1813 09/12/17 2129 09/13/17 0655 09/13/17 0926  BP: 120/71 98/81 110/66 110/83  Pulse: 60 (!) 43 100 65  Resp: 18 20 20 18   Temp:  (!) 97.4 F (36.3 C) 98 F (36.7 C)   TempSrc:  Oral Oral   SpO2: 95% 95% 98% 95%  Weight: 193 lb 14.4 oz (88 kg)  191 lb 8 oz (86.9 kg)   Height: 5\' 1"  (1.549 m)       Intake/Output Summary (Last 24 hours) at 09/13/2017 0956 Last data filed at 09/13/2017 0830 Gross per 24 hour  Intake 120 ml  Output 800 ml  Net -680 ml   Filed Weights   09/12/17 1259 09/12/17 1813 09/13/17 0655  Weight: 195 lb (88.5 kg) 193 lb 14.4 oz (88 kg) 191 lb 8 oz (86.9 kg)    Telemetry    Atrial fibrillation; rate reasonably well controlled Personally Reviewed   Physical Exam   GEN: Obese No acute distress.   Neck: No JVD Cardiac:  irregular Respiratory: Clear to auscultation bilaterally. GI: Soft, mild diffuse tenderness to palpation. MS: Trace to 1+ edema Neuro:  Nonfocal  Psych: Normal affect   Labs    Chemistry Recent Labs  Lab 09/12/17 1330 09/12/17 1623 09/13/17 0315  NA 139 139 139  K 3.6 3.4* 3.5  CL 100* 101 104  CO2 24 24 24   GLUCOSE 90 80 81  BUN 28* 27* 27*  CREATININE 1.72* 1.69* 1.55*  CALCIUM 9.6 9.4 9.0  PROT 6.6 6.1*  --   ALBUMIN 3.6 3.4*  --   AST 29 29  --   ALT 18 18  --   ALKPHOS 130* 123  --   BILITOT 1.2 0.9  --   GFRNONAA 26* 27* 30*  GFRAA 31* 31* 35*  ANIONGAP 15 14 11      Hematology Recent Labs  Lab 09/12/17 1330 09/12/17 1623  WBC 7.6 7.3  RBC 4.30 4.02  HGB 14.2 13.1  HCT 42.2 39.1  MCV 98.1 97.3  MCH 33.0 32.6  MCHC 33.6 33.5  RDW 14.6 14.3  PLT 197 207    Cardiac Enzymes Recent Labs  Lab 09/12/17 1330 09/12/17 1623 09/12/17 2215 09/13/17 0315  TROPONINI <0.03 <0.03 <0.03 <0.03    BNP Recent Labs  Lab 09/07/17 1503 09/12/17 1253 09/12/17 1623  BNP 235.8* 241.0* 232.0*       Radiology    Ct Abdomen Pelvis Wo Contrast  Result Date: 09/12/2017 CLINICAL DATA:  Abdominal pain.  Nausea and vomiting. EXAM: CT ABDOMEN AND PELVIS WITHOUT CONTRAST TECHNIQUE: Multidetector CT imaging of the abdomen and pelvis was performed following the standard protocol without IV contrast. COMPARISON:  CT 08/23/2017 FINDINGS: Lower chest: Round atelectasis at the left lung base with adjacent pleural thickening, unchanged from prior exam. Coronary artery calcifications. Hepatobiliary: No focal hepatic lesion. Gallstones within physiologically distended gallbladder. No pericholecystic inflammation. No biliary dilatation. Pancreas: No ductal dilatation or inflammation. Spleen: Normal in size.  Calcified granulomas. Adrenals/Urinary Tract: No adrenal nodule. No hydronephrosis or urolithiasis.  No perinephric edema. Urinary bladder is nondistended. Stomach/Bowel:  Stomach is physiologically distended. No small bowel inflammation, wall thickening or obstruction. Right inguinal hernia contains nonobstructed noninflamed distal ileum. Probable enteric sutures at the base of the cecum, suspect prior appendectomy. Appendix not visualized. Multifocal colonic diverticulosis, prominent in the sigmoid colon. No diverticulitis. No colonic inflammation or wall thickening. Vascular/Lymphatic: Advanced aortic atherosclerosis. No aneurysm. No enlarged abdominal or pelvic lymph nodes. Reproductive: Status post hysterectomy. No adnexal masses. Other: No free air, free fluid, or intra-abdominal fluid collection. Right inguinal hernia contains noninflamed nonobstructed small bowel. Musculoskeletal: Chronic postsurgical and degenerative change with multiple compression fractures in the lumbar spine. No acute osseous abnormality. Bilateral hip degenerative change, right greater than left. IMPRESSION: 1. No acute abnormality. 2. Cholelithiasis without CT findings of gallbladder inflammation. 3. Right inguinal hernia contains nonobstructed noninflamed distal ileum. 4. Highly extensive colonic diverticulosis without acute inflammation. 5.  Aortic Atherosclerosis (ICD10-I70.0). Electronically Signed   By: Jeb Levering M.D.   On: 09/12/2017 21:06   Dg Chest 2 View  Result Date: 09/13/2017 CLINICAL DATA:  Pain. EXAM: CHEST  2 VIEW COMPARISON:  09/12/2017 FINDINGS: The cardio pericardial silhouette is enlarged. Stable appearance chronic atelectasis or scarring at the left base. Interstitial markings are diffusely coarsened with chronic features. Pleural effusion and opacity over the lower spine on the lateral film is stable to prior. Lower lumbar compression deformity again noted. Bones are diffusely demineralized. Patient is status post right shoulder replacement. Telemetry leads overlie the chest. IMPRESSION: No substantial change in exam. Cardiomegaly with retrocardiac collapse/  consolidation and small left pleural effusion. Electronically Signed   By: Misty Stanley M.D.   On: 09/13/2017 09:44   Dg Chest 2 View  Result Date: 09/12/2017 CLINICAL DATA:  Rib pain, shortness of breath. EXAM: CHEST  2 VIEW COMPARISON:  Radiograph of August 23, 2017. FINDINGS: Stable cardiomegaly. No pneumothorax or significant pleural effusion is noted. Status post right shoulder arthroplasty. Right lung is clear. Stable left basilar curvilinear densities are noted most consistent with scarring, subsegmental atelectasis or less likely inflammation. IMPRESSION: Stable left basilar densities are noted most consistent with scarring, subsegmental atelectasis or less likely inflammation. Electronically Signed   By: Marijo Conception, M.D.   On: 09/12/2017 14:02     Patient Profile     83 y.o. female admitted with acute on chronic diastolic congestive heart failure.  Last echocardiogram November 2018 showed ejection fraction 50-55%, mild mitral regurgitation and mild left atrial enlargement.  Assessment & Plan    1 acute on chronic diastolic congestive heart failure-plan to continue present dose of Lasix.  Follow renal function closely.  She will need to follow low-sodium diet.  Fluid restrict to 1.5 L daily.  2 permanent atrial fibrillation-continue Coumadin.  Chads VASC 5.  Continue Cardizem and metoprolol for rate control.  3 rib pain-in discussions with patient today her symptoms are more consistent with a rib pain that increases with inspiration.  She states she is not having abdominal pain.  Abdominal CT yesterday shows cholelithiasis without gallbladder inflammation.  There is extensive colonic diverticulosis.  Previous hepatobiliary scan showed patent cystic and common bile ducts.  We will follow for now.  4 history of hypertension-blood pressure is controlled.  Continue present medications and follow.  5 chronic stage III kidney disease-follow renal function closely with  diuresis.  For questions or updates, please contact [place] Please consult [email or web address] for contact info under Cardiology/STEMI.      Signed, Kirk Ruths, MD  09/13/2017, 9:56 AM

## 2017-09-14 DIAGNOSIS — I5031 Acute diastolic (congestive) heart failure: Secondary | ICD-10-CM

## 2017-09-14 LAB — CBC
HEMATOCRIT: 40.4 % (ref 36.0–46.0)
HEMOGLOBIN: 13.6 g/dL (ref 12.0–15.0)
MCH: 33 pg (ref 26.0–34.0)
MCHC: 33.7 g/dL (ref 30.0–36.0)
MCV: 98.1 fL (ref 78.0–100.0)
Platelets: 214 10*3/uL (ref 150–400)
RBC: 4.12 MIL/uL (ref 3.87–5.11)
RDW: 14.6 % (ref 11.5–15.5)
WBC: 7.2 10*3/uL (ref 4.0–10.5)

## 2017-09-14 LAB — BASIC METABOLIC PANEL
Anion gap: 15 (ref 5–15)
BUN: 28 mg/dL — ABNORMAL HIGH (ref 6–20)
CO2: 22 mmol/L (ref 22–32)
Calcium: 9.3 mg/dL (ref 8.9–10.3)
Chloride: 99 mmol/L — ABNORMAL LOW (ref 101–111)
Creatinine, Ser: 1.62 mg/dL — ABNORMAL HIGH (ref 0.44–1.00)
GFR calc Af Amer: 33 mL/min — ABNORMAL LOW (ref >60)
GFR calc non Af Amer: 28 mL/min — ABNORMAL LOW (ref >60)
Glucose, Bld: 83 mg/dL (ref 65–99)
Potassium: 3.6 mmol/L (ref 3.5–5.1)
Sodium: 136 mmol/L (ref 135–145)

## 2017-09-14 LAB — PROTIME-INR
INR: 2.05
Prothrombin Time: 22.9 seconds — ABNORMAL HIGH (ref 11.4–15.2)

## 2017-09-14 MED ORDER — WARFARIN SODIUM 3 MG PO TABS
3.0000 mg | ORAL_TABLET | ORAL | Status: AC
  Administered 2017-09-14: 3 mg via ORAL
  Filled 2017-09-14: qty 1

## 2017-09-14 MED ORDER — ACETAMINOPHEN 500 MG PO TABS
500.0000 mg | ORAL_TABLET | Freq: Two times a day (BID) | ORAL | Status: DC
  Administered 2017-09-14 – 2017-09-17 (×6): 500 mg via ORAL
  Filled 2017-09-14 (×5): qty 1

## 2017-09-14 MED ORDER — FUROSEMIDE 10 MG/ML IJ SOLN
80.0000 mg | Freq: Two times a day (BID) | INTRAMUSCULAR | Status: DC
  Administered 2017-09-14 – 2017-09-15 (×2): 80 mg via INTRAVENOUS
  Filled 2017-09-14 (×3): qty 8

## 2017-09-14 MED ORDER — WARFARIN SODIUM 3 MG PO TABS: 3.0000 mg | ORAL_TABLET | Freq: Once | ORAL | Status: DC

## 2017-09-14 MED ORDER — WARFARIN SODIUM 2 MG PO TABS
2.0000 mg | ORAL_TABLET | Freq: Once | ORAL | Status: AC
  Administered 2017-09-14: 2 mg via ORAL
  Filled 2017-09-14: qty 1

## 2017-09-14 NOTE — Progress Notes (Signed)
Pt family at bedside refusing bed alarm  Pt family educated on high fall risk  Pt family still refused Will continue to monitor

## 2017-09-14 NOTE — Clinical Social Work Note (Signed)
Clinical Social Work Assessment  Patient Details  Name: Angel Meyer MRN: [id number] Date of Birth: 1933/06/05  Date of referral:  09/14/17               Reason for consult:  Discharge Planning                Permission sought to share information with:  Facility Sport and exercise psychologist, Family Supports Permission granted to share information::  Yes, Verbal Permission Granted  Name::     [name]  Agency::  [facility]  Relationship::  Daughter  Contact Information:  [phone]  Housing/Transportation Living arrangements for the past 2 months:  [place] of Information:  Patient, Medical Team, Adult Children Patient Interpreter Needed:  None Criminal Activity/Legal Involvement Pertinent to Current Situation/Hospitalization:  No - Comment as needed Significant Relationships:  Adult Children Lives with:  Facility Resident Do you feel safe going back to the place where you live?  Yes Need for family participation in patient care:  Yes (Comment)  Care giving concerns:  Patient is a resident at [facility].   Social Worker assessment / plan:  CSW met with patient. Daughter at bedside. CSW, patient, and daughter are familiar with each other from last admission. Patient and her daughter confirmed plan to return to [facility] once medically stable for discharge. Patient's daughter will transport by car depending on condition on day of discharge. Patient's daughter asked if she is now under inpatient status. CSW confirmed and said order was placed yesterday at 12:19 pm. No further concerns. CSW encouraged patient and her daughter to contact CSW as needed. CSW will continue to follow patient and her daughter for support and facilitate discharge to ALF once medically stable.  Employment status:  Retired Forensic scientist:  Medicare PT Recommendations:  Not assessed at this time [place] / Referral to community resources:  Other (Comment  Required)(Plan is to return to ALF.)  Patient/Family's Response to care:  Patient and her daughter agreeable to return to ALF. Patient's daughter supportive and involved in patient's care. Patient and her daughter appreciated social work intervention.  Patient/Family's Understanding of and Emotional Response to Diagnosis, Current Treatment, and Prognosis:  Patient and her daughter have a good understanding of the reason for admission and plan to return to ALF once medically stable. Patient and her daughter appear happy with hospital care.  Emotional Assessment Appearance:  Appears stated age Attitude/Demeanor/Rapport:  Other(Pleasant) Affect (typically observed):  Accepting, Appropriate, Calm, Pleasant Orientation:  Oriented to Self, Oriented to Place, Oriented to  Time, Oriented to Situation Alcohol / Substance use:  Never Used Psych involvement (Current and /or in the community):  No (Comment)  Discharge Needs  Concerns to be addressed:  Care Coordination Readmission within the last 30 days:  Yes Current discharge risk:  None Barriers to Discharge:  Continued Medical Work up   Candie Chroman, LCSW 09/14/2017, 9:55 AM

## 2017-09-14 NOTE — Progress Notes (Addendum)
ANTICOAGULATION CONSULT NOTE - Consult  Pharmacy Consult for Coumadin Indication: atrial fibrillation  Allergies  Allergen Reactions  . Ciprofloxacin Nausea And Vomiting  . Demerol [Meperidine] Nausea And Vomiting  . Sulfa Antibiotics Nausea And Vomiting and Other (See Comments)    And all derivatives -  GI Bleeding (?)  . Sulfacetamide Sodium Nausea And Vomiting and Other (See Comments)    GI bleeding, also (?)   Patient Measurements: Height: 5\' 1"  (154.9 cm) Weight: 187 lb 3.2 oz (84.9 kg)(scale b) IBW/kg (Calculated) : 47.8 Heparin Dosing Weight: n/a  Vital Signs: Temp: 97.5 F (36.4 C) (12/19 0417) Temp Source: Oral (12/19 0417) BP: 99/62 (12/19 0417) Pulse Rate: 84 (12/19 0417)  Labs: Recent Labs    09/12/17 1330 09/12/17 1623 09/12/17 2215 09/13/17 0315 09/14/17 0432  HGB 14.2 13.1  --   --  13.6  HCT 42.2 39.1  --   --  40.4  PLT 197 207  --   --  214  APTT  --  42*  --   --   --   LABPROT 25.9* 25.1*  --  24.3* 22.9*  INR 2.39 2.30  --  2.20 2.05  CREATININE 1.72* 1.69*  --  1.55* 1.62*  TROPONINI <0.03 <0.03 <0.03 <0.03  --    Estimated Creatinine Clearance: 26.5 mL/min (A) (by C-G formula based on SCr of 1.62 mg/dL (H)).  Medical History: Past Medical History:  Diagnosis Date  . Arthritis   . Diastolic dysfunction    preserved EF,  CHF In setting of afib previously  . History of blood transfusion   . Hyperlipidemia   . Hypertension   . Incontinence of urine   . OSA (obstructive sleep apnea)    does not use CPAP  . Osteoporosis   . Persistent atrial fibrillation (HCC)    failed on Flecainide, Tikosyn, and amiodarone  . Renal calculi   . Restless legs   . Rheumatic fever    age 83  . Systolic heart failure (HCC)    EF 30 to 35% per echo 08/2013,  subsequently has normalized   Medications:  Scheduled:  . acetaminophen  500 mg Oral BID  . diltiazem  180 mg Oral Daily  . docusate sodium  100 mg Oral Daily  . donepezil  10 mg Oral QHS  .  escitalopram  10 mg Oral Daily  . furosemide  60 mg Intravenous BID  . gabapentin  100 mg Oral QHS  . HYDROcodone-acetaminophen  1 tablet Oral TID  . magnesium oxide  400 mg Oral Daily  . memantine  10 mg Oral BID  . metoprolol succinate  25 mg Oral Daily  . oxybutynin  10 mg Oral QHS  . polyethylene glycol  17 g Oral Daily  . rOPINIRole  1 mg Oral 2 times per day  . sodium chloride flush  3 mL Intravenous Q12H  . spironolactone  25 mg Oral Daily  . trimethoprim  100 mg Oral Daily  . Warfarin - Pharmacist Dosing Inpatient   Does not apply q1800   Assessment: 83 yo female with afib on chronic Coumadin.  INR therapeutic on admit.  No overt bleeding or complications noted.  PTA Coumadin dose 2.5 mg daily. Warfarin was not given 12/20, will give one time increased dose STAT and resume regular dose tonight.  PTA warfarin dose 2.5mg  daily  INR therapeutic: 2.05  Goal of Therapy:  INR 2-3 Monitor platelets by anticoagulation protocol: Yes   Plan:  1. Coumadin  3mg  x 1 now and 2 mg tonight at 1800 2. Daily PT/INR. 3. Monitor for s/sx of bleeding  Georga Bora, PharmD Clinical Pharmacist 09/14/2017 8:47 AM

## 2017-09-14 NOTE — Progress Notes (Signed)
Progress Note  Patient Name: Angel Meyer Date of Encounter: 09/14/2017  Primary Cardiologist: Dr. Rayann Heman  Subjective   Pt is breathing better today with decreased rib pain.  Inpatient Medications    Scheduled Meds: . acetaminophen  500 mg Oral BID  . diltiazem  180 mg Oral Daily  . docusate sodium  100 mg Oral Daily  . donepezil  10 mg Oral QHS  . escitalopram  10 mg Oral Daily  . furosemide  60 mg Intravenous BID  . gabapentin  100 mg Oral QHS  . HYDROcodone-acetaminophen  1 tablet Oral TID  . magnesium oxide  400 mg Oral Daily  . memantine  10 mg Oral BID  . metoprolol succinate  25 mg Oral Daily  . oxybutynin  10 mg Oral QHS  . polyethylene glycol  17 g Oral Daily  . rOPINIRole  1 mg Oral 2 times per day  . sodium chloride flush  3 mL Intravenous Q12H  . spironolactone  25 mg Oral Daily  . trimethoprim  100 mg Oral Daily  . warfarin  3 mg Oral ONCE-1800  . Warfarin - Pharmacist Dosing Inpatient   Does not apply q1800   Continuous Infusions: . sodium chloride     PRN Meds: sodium chloride, HYDROcodone-acetaminophen, ondansetron (ZOFRAN) IV, sodium chloride flush   Vital Signs    Vitals:   09/13/17 0926 09/13/17 1137 09/13/17 2015 09/14/17 0417  BP: 110/83 107/76 106/67 99/62  Pulse: 65 100 (!) 103 84  Resp: 18 18 18 18   Temp:  (!) 97.5 F (36.4 C) (!) 97.4 F (36.3 C) (!) 97.5 F (36.4 C)  TempSrc:  Oral Oral Oral  SpO2: 95% 93% 93% 98%  Weight:    187 lb 3.2 oz (84.9 kg)  Height:        Intake/Output Summary (Last 24 hours) at 09/14/2017 1019 Last data filed at 09/14/2017 0830 Gross per 24 hour  Intake 360 ml  Output 800 ml  Net -440 ml   Filed Weights   09/12/17 1813 09/13/17 0655 09/14/17 0417  Weight: 193 lb 14.4 oz (88 kg) 191 lb 8 oz (86.9 kg) 187 lb 3.2 oz (84.9 kg)     Physical Exam   General: Well developed, well nourished, female appearing in no acute distress. Head: Normocephalic, atraumatic.  Neck: Supple without bruits, no  JVD. Lungs:  Resp regular and unlabored, CTA. Heart: irregular rhythm, regular rate, no murmur; no rub. Abdomen: Soft, non-tender, non-distended with normoactive bowel sounds. No hepatomegaly. No rebound/guarding. No obvious abdominal masses. Extremities: No clubbing, cyanosis, trace edema. Distal pedal pulses are 1+ bilaterally. Neuro: Alert and oriented X 3. Moves all extremities spontaneously. Psych: Normal affect.  Labs    Chemistry Recent Labs  Lab 09/12/17 1330 09/12/17 1623 09/13/17 0315 09/14/17 0432  NA 139 139 139 136  K 3.6 3.4* 3.5 3.6  CL 100* 101 104 99*  CO2 24 24 24 22   GLUCOSE 90 80 81 83  BUN 28* 27* 27* 28*  CREATININE 1.72* 1.69* 1.55* 1.62*  CALCIUM 9.6 9.4 9.0 9.3  PROT 6.6 6.1*  --   --   ALBUMIN 3.6 3.4*  --   --   AST 29 29  --   --   ALT 18 18  --   --   ALKPHOS 130* 123  --   --   BILITOT 1.2 0.9  --   --   GFRNONAA 26* 27* 30* 28*  GFRAA 31* 31* 35* 33*  ANIONGAP 15 14 11 15      Hematology Recent Labs  Lab 09/12/17 1330 09/12/17 1623 09/14/17 0432  WBC 7.6 7.3 7.2  RBC 4.30 4.02 4.12  HGB 14.2 13.1 13.6  HCT 42.2 39.1 40.4  MCV 98.1 97.3 98.1  MCH 33.0 32.6 33.0  MCHC 33.6 33.5 33.7  RDW 14.6 14.3 14.6  PLT 197 207 214    Cardiac Enzymes Recent Labs  Lab 09/12/17 1330 09/12/17 1623 09/12/17 2215 09/13/17 0315  TROPONINI <0.03 <0.03 <0.03 <0.03   No results for input(s): TROPIPOC in the last 168 hours.   BNP Recent Labs  Lab 09/07/17 1503 09/12/17 1253 09/12/17 1623  BNP 235.8* 241.0* 232.0*     DDimer No results for input(s): DDIMER in the last 168 hours.   Radiology    Ct Abdomen Pelvis Wo Contrast  Result Date: 09/12/2017 CLINICAL DATA:  Abdominal pain.  Nausea and vomiting. EXAM: CT ABDOMEN AND PELVIS WITHOUT CONTRAST TECHNIQUE: Multidetector CT imaging of the abdomen and pelvis was performed following the standard protocol without IV contrast. COMPARISON:  CT 08/23/2017 FINDINGS: Lower chest: Round  atelectasis at the left lung base with adjacent pleural thickening, unchanged from prior exam. Coronary artery calcifications. Hepatobiliary: No focal hepatic lesion. Gallstones within physiologically distended gallbladder. No pericholecystic inflammation. No biliary dilatation. Pancreas: No ductal dilatation or inflammation. Spleen: Normal in size.  Calcified granulomas. Adrenals/Urinary Tract: No adrenal nodule. No hydronephrosis or urolithiasis.  No perinephric edema. Urinary bladder is nondistended. Stomach/Bowel: Stomach is physiologically distended. No small bowel inflammation, wall thickening or obstruction. Right inguinal hernia contains nonobstructed noninflamed distal ileum. Probable enteric sutures at the base of the cecum, suspect prior appendectomy. Appendix not visualized. Multifocal colonic diverticulosis, prominent in the sigmoid colon. No diverticulitis. No colonic inflammation or wall thickening. Vascular/Lymphatic: Advanced aortic atherosclerosis. No aneurysm. No enlarged abdominal or pelvic lymph nodes. Reproductive: Status post hysterectomy. No adnexal masses. Other: No free air, free fluid, or intra-abdominal fluid collection. Right inguinal hernia contains noninflamed nonobstructed small bowel. Musculoskeletal: Chronic postsurgical and degenerative change with multiple compression fractures in the lumbar spine. No acute osseous abnormality. Bilateral hip degenerative change, right greater than left. IMPRESSION: 1. No acute abnormality. 2. Cholelithiasis without CT findings of gallbladder inflammation. 3. Right inguinal hernia contains nonobstructed noninflamed distal ileum. 4. Highly extensive colonic diverticulosis without acute inflammation. 5.  Aortic Atherosclerosis (ICD10-I70.0). Electronically Signed   By: Jeb Levering M.D.   On: 09/12/2017 21:06   Dg Chest 2 View  Result Date: 09/13/2017 CLINICAL DATA:  Pain. EXAM: CHEST  2 VIEW COMPARISON:  09/12/2017 FINDINGS: The cardio  pericardial silhouette is enlarged. Stable appearance chronic atelectasis or scarring at the left base. Interstitial markings are diffusely coarsened with chronic features. Pleural effusion and opacity over the lower spine on the lateral film is stable to prior. Lower lumbar compression deformity again noted. Bones are diffusely demineralized. Patient is status post right shoulder replacement. Telemetry leads overlie the chest. IMPRESSION: No substantial change in exam. Cardiomegaly with retrocardiac collapse/ consolidation and small left pleural effusion. Electronically Signed   By: Misty Stanley M.D.   On: 09/13/2017 09:44   Dg Chest 2 View  Result Date: 09/12/2017 CLINICAL DATA:  Rib pain, shortness of breath. EXAM: CHEST  2 VIEW COMPARISON:  Radiograph of August 23, 2017. FINDINGS: Stable cardiomegaly. No pneumothorax or significant pleural effusion is noted. Status post right shoulder arthroplasty. Right lung is clear. Stable left basilar curvilinear densities are noted most consistent with scarring, subsegmental atelectasis or less  likely inflammation. IMPRESSION: Stable left basilar densities are noted most consistent with scarring, subsegmental atelectasis or less likely inflammation. Electronically Signed   By: Marijo Conception, M.D.   On: 09/12/2017 14:02     Telemetry    Afib - Personally Reviewed  ECG    No new tracings - Personally Reviewed   Cardiac Studies   Echo 08/25/17: Study Conclusions - Left ventricle: The cavity size was normal. Wall thickness was   increased in a pattern of mild LVH. Systolic function was normal.   The estimated ejection fraction was in the range of 50% to 55%. - Mitral valve: There was mild regurgitation. - Left atrium: The atrium was mildly dilated. - Pericardium, extracardiac: A trivial pericardial effusion was   identified.  Patient Profile     83 y.o. female admitted with acute on chronic diastolic congestive heart failure.  Last  echocardiogram November 2018 showed ejection fraction 50-55%, mild mitral regurgitation and mild left atrial enlargement.  Assessment & Plan    1. Acute on chronic diastolic heart failure - she is overall net negative 1.2 L, weight is 187 lbs from 195 lb on admission - sCr 1.62 (1.55), baseline appears to be 1.2-1.45 - diuresing on 60 mg IV lasix BID - pt does not appear significantly volume overloaded on exam - she states her abdominal pain and rib pain are better today and she is breathing better - 80 mg PO lasix was previously not effective as a home dose; may consider going back to demadex - will discuss with attending  2. Permanent Afib - continue coumadin for a CHA2DS2-VASc Score of 5.  - continue cardizem and toprol for rate control - ventricular rate 90s  3. HTN - pressures well-controlled - continue current medications  4. CKD stage III - sCr 1.62 (1.55) - continue current diuresis for one more day - follow daily labs   Signed, Ledora Bottcher , PA-C 10:19 AM 09/14/2017 Pager: [phone] As above, patient seen and examined.  Her dyspnea is mildly improved.  She does describe mild abdominal discomfort and rib pain.  She states the symptoms improved previously after diuresis.  I will increase Lasix to 80 mg twice daily.  We will continue to follow renal function.  We will need to accept some degree of renal insufficiency to keep her euvolemic.  I have also requested palliative care consult. Kirk Ruths, MD

## 2017-09-15 DIAGNOSIS — Z7189 Other specified counseling: Secondary | ICD-10-CM

## 2017-09-15 DIAGNOSIS — Z515 Encounter for palliative care: Secondary | ICD-10-CM

## 2017-09-15 DIAGNOSIS — R0600 Dyspnea, unspecified: Secondary | ICD-10-CM

## 2017-09-15 DIAGNOSIS — R0781 Pleurodynia: Secondary | ICD-10-CM

## 2017-09-15 LAB — CBC
HCT: 42.2 % (ref 36.0–46.0)
HEMOGLOBIN: 14.7 g/dL (ref 12.0–15.0)
MCH: 33.9 pg (ref 26.0–34.0)
MCHC: 34.8 g/dL (ref 30.0–36.0)
MCV: 97.2 fL (ref 78.0–100.0)
PLATELETS: 199 10*3/uL (ref 150–400)
RBC: 4.34 MIL/uL (ref 3.87–5.11)
RDW: 14.4 % (ref 11.5–15.5)
WBC: 7.6 10*3/uL (ref 4.0–10.5)

## 2017-09-15 LAB — BASIC METABOLIC PANEL
Anion gap: 12 (ref 5–15)
BUN: 25 mg/dL — ABNORMAL HIGH (ref 6–20)
CO2: 24 mmol/L (ref 22–32)
Calcium: 9.3 mg/dL (ref 8.9–10.3)
Chloride: 100 mmol/L — ABNORMAL LOW (ref 101–111)
Creatinine, Ser: 1.38 mg/dL — ABNORMAL HIGH (ref 0.44–1.00)
GFR calc Af Amer: 40 mL/min — ABNORMAL LOW (ref >60)
GFR calc non Af Amer: 35 mL/min — ABNORMAL LOW (ref >60)
Glucose, Bld: 108 mg/dL — ABNORMAL HIGH (ref 65–99)
Potassium: 3.5 mmol/L (ref 3.5–5.1)
Sodium: 136 mmol/L (ref 135–145)

## 2017-09-15 LAB — PROTIME-INR
INR: 2.22
PROTHROMBIN TIME: 24.4 s — AB (ref 11.4–15.2)

## 2017-09-15 MED ORDER — TORSEMIDE 20 MG PO TABS
20.0000 mg | ORAL_TABLET | Freq: Once | ORAL | Status: AC
  Administered 2017-09-15: 20 mg via ORAL
  Filled 2017-09-15: qty 1

## 2017-09-15 MED ORDER — WARFARIN SODIUM 2.5 MG PO TABS
2.5000 mg | ORAL_TABLET | Freq: Once | ORAL | Status: AC
  Administered 2017-09-15: 2.5 mg via ORAL
  Filled 2017-09-15: qty 1

## 2017-09-15 MED ORDER — GABAPENTIN 100 MG PO CAPS
100.0000 mg | ORAL_CAPSULE | Freq: Two times a day (BID) | ORAL | Status: DC
  Administered 2017-09-15 – 2017-09-17 (×5): 100 mg via ORAL
  Filled 2017-09-15 (×5): qty 1

## 2017-09-15 NOTE — Progress Notes (Signed)
IV team came to place new IV but patient is refusing. Patient informed and educated that she is not getting any IV medications tonight however, we need IV in case of emergency. Patient verbalized understanding and stated that she will wait till morning when her IV medication is due to have new IV placed. Cardiology APP on call B.Strader informed.  Will continue to monitor.  Aman Batley, RN

## 2017-09-15 NOTE — Progress Notes (Signed)
Progress Note  Patient Name: Angel Meyer Date of Encounter: 09/15/2017  Primary Cardiologist: Dr. Rayann Heman  Subjective   Pt continues to complain of rib pain. Today, it is worse on the right side compared to the left.  Inpatient Medications    Scheduled Meds: . acetaminophen  500 mg Oral BID  . diltiazem  180 mg Oral Daily  . docusate sodium  100 mg Oral Daily  . donepezil  10 mg Oral QHS  . escitalopram  10 mg Oral Daily  . furosemide  80 mg Intravenous BID  . gabapentin  100 mg Oral BID  . HYDROcodone-acetaminophen  1 tablet Oral TID  . magnesium oxide  400 mg Oral Daily  . memantine  10 mg Oral BID  . metoprolol succinate  25 mg Oral Daily  . oxybutynin  10 mg Oral QHS  . polyethylene glycol  17 g Oral Daily  . rOPINIRole  1 mg Oral 2 times per day  . sodium chloride flush  3 mL Intravenous Q12H  . spironolactone  25 mg Oral Daily  . trimethoprim  100 mg Oral Daily  . Warfarin - Pharmacist Dosing Inpatient   Does not apply q1800   Continuous Infusions: . sodium chloride     PRN Meds: sodium chloride, HYDROcodone-acetaminophen, ondansetron (ZOFRAN) IV, sodium chloride flush   Vital Signs    Vitals:   09/14/17 1116 09/14/17 2107 09/15/17 0009 09/15/17 0411  BP: (!) 107/52 109/72 (!) 90/40 102/68  Pulse: 72 94 87 68  Resp: 16 18 18 18   Temp: (!) 97.4 F (36.3 C) 97.7 F (36.5 C) 97.6 F (36.4 C) 97.7 F (36.5 C)  TempSrc: Oral Oral Oral Oral  SpO2: 93% 95% 94% 92%  Weight:    186 lb 12.8 oz (84.7 kg)  Height:        Intake/Output Summary (Last 24 hours) at 09/15/2017 0957 Last data filed at 09/15/2017 1610 Gross per 24 hour  Intake 600 ml  Output 2150 ml  Net -1550 ml   Filed Weights   09/13/17 0655 09/14/17 0417 09/15/17 0411  Weight: 191 lb 8 oz (86.9 kg) 187 lb 3.2 oz (84.9 kg) 186 lb 12.8 oz (84.7 kg)     Physical Exam   General: Well developed, well nourished, female appearing in no acute distress. Head: Normocephalic,  atraumatic.  Neck: Supple without bruits, no JVD. Lungs:  Resp regular and unlabored, CTA. Heart: irregular rhythm, regular rate, no murmur; no rub. Abdomen: Soft, non-tender, non-distended with normoactive bowel sounds. No hepatomegaly. No rebound/guarding. No obvious abdominal masses. Extremities: No clubbing, cyanosis, no edema. Distal pedal pulses are 1+ bilaterally. Neuro: Alert and oriented X 3. Moves all extremities spontaneously. Psych: Normal affect.  Labs    Chemistry Recent Labs  Lab 09/12/17 1330 09/12/17 1623 09/13/17 0315 09/14/17 0432 09/15/17 0827  NA 139 139 139 136 136  K 3.6 3.4* 3.5 3.6 3.5  CL 100* 101 104 99* 100*  CO2 24 24 24 22 24   GLUCOSE 90 80 81 83 108*  BUN 28* 27* 27* 28* 25*  CREATININE 1.72* 1.69* 1.55* 1.62* 1.38*  CALCIUM 9.6 9.4 9.0 9.3 9.3  PROT 6.6 6.1*  --   --   --   ALBUMIN 3.6 3.4*  --   --   --   AST 29 29  --   --   --   ALT 18 18  --   --   --   ALKPHOS 130* 123  --   --   --  BILITOT 1.2 0.9  --   --   --   GFRNONAA 26* 27* 30* 28* 35*  GFRAA 31* 31* 35* 33* 40*  ANIONGAP 15 14 11 15 12      Hematology Recent Labs  Lab 09/12/17 1623 09/14/17 0432 09/15/17 0827  WBC 7.3 7.2 7.6  RBC 4.02 4.12 4.34  HGB 13.1 13.6 14.7  HCT 39.1 40.4 42.2  MCV 97.3 98.1 97.2  MCH 32.6 33.0 33.9  MCHC 33.5 33.7 34.8  RDW 14.3 14.6 14.4  PLT 207 214 199    Cardiac Enzymes Recent Labs  Lab 09/12/17 1330 09/12/17 1623 09/12/17 2215 09/13/17 0315  TROPONINI <0.03 <0.03 <0.03 <0.03   No results for input(s): TROPIPOC in the last 168 hours.   BNP Recent Labs  Lab 09/12/17 1253 09/12/17 1623  BNP 241.0* 232.0*     DDimer No results for input(s): DDIMER in the last 168 hours.   Radiology    No results found.   Telemetry    Afib 110s - Personally Reviewed  ECG    No new tracings - Personally Reviewed   Cardiac Studies   Echo 08/25/17: Study Conclusions - Left ventricle: The cavity size was normal. Wall  thickness was increased in a pattern of mild LVH. Systolic function was normal. The estimated ejection fraction was in the range of 50% to 55%. - Mitral valve: There was mild regurgitation. - Left atrium: The atrium was mildly dilated. - Pericardium, extracardiac: A trivial pericardial effusion was identified.  Patient Profile     83 y.o. female admitted with acute on chronic diastolic congestive heart failure. Last echocardiogram November 2018 showed ejection fraction 50-55%, mild mitral regurgitation and mild left atrial enlargement.  Assessment & Plan    1. Acute on chronic diastolic heart failure - she is overall net negative 3.3 L with 2.8 L urine output yesterday - pt is diuresing well on 80 mg IV lasix BID - weight is 186 lbs, from 195 lbs on admission - creatinine is improving - will continue diureses  2. CKD stage III - sCr 1.38 (1.62) - continue current diuresis today  3. Permanent Afib - continue coumadin for CHA2DS2-VASc Score of 5 - continue cardizem and toprol - no room to increase for better rate control given her marginal pressures  4. HTN - pressures well-controlled if not a bit marginal at times - continue current regimen, decrease as needed to avoid symptoms and orthostasis  5. Palliative care consult appreciated. - Pt would benefit from a hospital bed at home.  6. Abdominal pain - consider GI consult for lump on right upper abdomen   Signed, Ledora Bottcher , PA-C 9:57 AM 09/15/2017 Pager: [phone] As above, patient seen and examined.  She states her dyspnea is mildly improved.  She denies chest pain.  She describes rib pain and abdominal soreness.  On exam she is tender to palpation in her ribs.  Her abdominal exam is soft with no masses palpated.  Some of her symptoms seem out of proportion to findings on physical exam.  She does not appear to be markedly volume overloaded.  However she states her symptoms typically improve with  diuresis.  We will continue present dose of Lasix.  Follow renal function closely. Continue coumadin, cardizem and metoprolol for atrial fibrillation. Kirk Ruths, MD

## 2017-09-15 NOTE — Progress Notes (Signed)
  Patient lost IV access this afternoon and the IV Team has been unable to regain access thus far (planning to try again later tonight). Will given PTA Torsemide 20mg  for one dose this evening and reassess in AM regarding continual IV diuresis vs. PO diuretics.  Signed, Erma Heritage, PA-C 09/15/2017, 6:29 PM Pager: [phone]

## 2017-09-15 NOTE — Progress Notes (Signed)
ANTICOAGULATION CONSULT NOTE - Follow Up Consult  Pharmacy Consult for Coumadin Indication: atrial fibrillation  Allergies  Allergen Reactions  . Ciprofloxacin Nausea And Vomiting  . Demerol [Meperidine] Nausea And Vomiting  . Sulfa Antibiotics Nausea And Vomiting and Other (See Comments)    And all derivatives -  GI Bleeding (?)  . Sulfacetamide Sodium Nausea And Vomiting and Other (See Comments)    GI bleeding, also (?)    Patient Measurements: Height: 5\' 1"  (154.9 cm) Weight: 186 lb 12.8 oz (84.7 kg) IBW/kg (Calculated) : 47.8  Vital Signs: Temp: 97.7 F (36.5 C) (12/20 0411) Temp Source: Oral (12/20 0411) BP: 107/69 (12/20 0957) Pulse Rate: 61 (12/20 0957)  Labs: Recent Labs    09/12/17 1623 09/12/17 2215 09/13/17 0315 09/14/17 0432 09/15/17 0827  HGB 13.1  --   --  13.6 14.7  HCT 39.1  --   --  40.4 42.2  PLT 207  --   --  214 199  APTT 42*  --   --   --   --   LABPROT 25.1*  --  24.3* 22.9* 24.4*  INR 2.30  --  2.20 2.05 2.22  CREATININE 1.69*  --  1.55* 1.62* 1.38*  TROPONINI <0.03 <0.03 <0.03  --   --     Estimated Creatinine Clearance: 31.1 mL/min (A) (by C-G formula based on SCr of 1.38 mg/dL (H)).  Assessment:  83 yr old female continues on Coumadin for afib.  INR remains therapeutic at 2.22 today.  Coumadin dose missed on 12/18 pm, got 3 mg in the am and 2 mg in the pm on 12/19.   Home Coumadin regimen: 2.5 mg daily.  INR 2.30 on admit 09/12/17.  Goal of Therapy:  INR 2-3 Monitor platelets by anticoagulation protocol: Yes   Plan:   Coumadin 2.5 mg today, usual dose.  Daily PT/INR for now.  Arty Baumgartner, [place] Pager: [phone] 09/15/2017,11:13 AM

## 2017-09-15 NOTE — Consult Note (Signed)
Consultation Note Date: 09/15/2017   Patient Name: Angel Meyer  DOB: 05/27/1933  MRN: [id number]  Age / Sex: 83 y.o., female  PCP: Angel Post, MD Referring Physician: Lelon Perla, MD  Reason for Consultation: Establishing goals of care  HPI/Patient Profile: 83 y.o. female  with past medical history of systolic CHF with EF 98-92%, afib on coumadin, multiple cardioversion's, restless leg, OSA, HTN, HLD, mild dementia admitted on 09/12/2017 with swelling and abdominal pain. Patient with acute on chronic CHF. Since admission, diuresing well on Lasix 57m IV BID. Also with CKD III and monitoring renal function. Permanent atrial fibrillation on coumadin, cardizem, and metoprolol. Patient complains of rib pain. Abdominal CT reveals cholelithiasis without gallbladder inflammation. Notes reviewed from recent hospitalization. Rib pain felt to be musculoskeletal. Receiving scheduled Norco. Palliative medicine consultation for goals of care/symptom management.  Clinical Assessment and Goals of Care: I have reviewed medical records, discussed with care team, and met with patient and daughter (Angel Meyer at bedside to discuss diagnosis, [place] EOL wishes, and disposition options.   Introduced Palliative Medicine as specialized medical care for people living with serious illness. It focuses on providing relief from the symptoms and stress of a serious illness. The goal is to improve quality of life for both the patient and the family.  We discussed a brief life review of the patient. Two daughters (Angel Meyer and [place] so her daughter could care for her. She was moved into ALF this past summer. Initially diagnosed with heart failure 4 years ago when she was hospitalized for CHF exacerbation.   Baseline, she is fairly independent,  able to perform ADL's, and with good appetite. She has mild cognitive impairment. Lately, she has been more comfortable sleeping in a recliner due to orthopnea.   We discussed events leading up to hospitalization, diagnoses, and interventions. Discussed disease trajectory of CHF. Patient and daughter have a good understanding that CHF is a chronic, progressive disease.   Advanced directives, concepts specific to code status, and artifical feeding and hydration were discussed. Patient has a documented living will with both daughters as HCPOA. Requested copy. Patient speaks clearly on her wishes against aggressive interventions such as resuscitation/life support. Shakes her head 'no' to feeding tube. Introduced MOST form and encouraged they complete with any healthcare provider as an extension to a DNR.   I attempted to elicit values and goals of care important to the patient. She states "I'm not ready to give up yet" but also emphasizes "comfort" and "pain relief." She feels better today with decreased swelling and rib pain. She is hopeful to be discharged "before Christmas" and return to ALF. It is important that she can continue in group activities at ALF including bingo and spa days.   We discussed symptom management. Ms. BCederberghas been seen outpatient by ortho who initiated scheduled Tylenol 500 BID and scheduled Norco. She does state relief from current pain regimen and also feels pain  relief from losing water weight. Ortho also started Gabapentin 17m HS. TLarena Glassmanis interested in discontinuing medications that are not a benefit to her mother including Aricept, Namenda, and oxybutynin. Encouraged she discuss this with PCP.   Palliative Care services outpatient were explained and offered. Patient and daughter agree with continued palliative support outpatient, also understanding this can transition to hospice services when appropriate. Daughter is requesting hospital bed on discharge and believes this  will provide her mother more comfort.   Questions and concerns were addressed.  Hard Choices booklet left for review. Daughter has PMT contact information.    SUMMARY OF RECOMMENDATIONS    DNR/DNI  Continue medical management.   Introduced MOST form.   Discussed disease trajectory of CHF.  Symptom management--see below.   SW consult. Daughter requesting hospital bed at ALF on discharge.  Recommend continued support from palliative services outpatient.   Code Status/Advance Care Planning:  DNR   Symptom Management:   Increased-Gabapentin 10106mPO BID  Continue Acetaminophen 50049mO BID  Continue Norco 7.5-325mg PO TID  May have Norco 7.5-325mg PO q8h prn severe pain  Daily Miralax and Colace  Palliative Prophylaxis:   Bowel Regimen and Frequent Pain Assessment  Psycho-social/Spiritual:   Desire for further Chaplaincy support: yes  Additional Recommendations: Caregiving  Support/Resources and Education on Hospice  Prognosis:   Unable to determine  Discharge Planning: Back to ALF with home health/palliative outpatient      Primary Diagnoses: Present on Admission: . Acute diastolic heart failure ([place] I have reviewed the medical record, interviewed the patient and family, and examined the patient. The following aspects are pertinent.  Past Medical History:  Diagnosis Date  . Arthritis   . Diastolic dysfunction    preserved EF,  CHF In setting of afib previously  . History of blood transfusion   . Hyperlipidemia   . Hypertension   . Incontinence of urine   . OSA (obstructive sleep apnea)    does not use CPAP  . Osteoporosis   . Persistent atrial fibrillation (HCC)    failed on Flecainide, Tikosyn, and amiodarone  . Renal calculi   . Restless legs   . Rheumatic fever    age 19  88 Systolic heart failure (HCC)    EF 30 to 35% per echo 08/2013,  subsequently has normalized   Social History   Socioeconomic History  . Marital status:  Divorced    Spouse name: None  . Number of children: None  . Years of education: None  . Highest education level: None  Social Needs  . Financial resource strain: None  . Food insecurity - worry: None  . Food insecurity - inability: None  . Transportation needs - medical: None  . Transportation needs - non-medical: None  Occupational History  . None  Tobacco Use  . Smoking status: Never Smoker  . Smokeless tobacco: Never Used  Substance and Sexual Activity  . Alcohol use: No  . Drug use: No  . Sexual activity: Not Currently  Other Topics Concern  . None  Social History Narrative   [place]  [place].   Retired secNetwork engineerFamily History  Problem Relation Age of Onset  . Cancer Mother        breast  . Diabetes Mother   . Cancer Paternal Grandfather    Scheduled Meds: . acetaminophen  500 mg Oral BID  . diltiazem  180 mg  Oral Daily  . docusate sodium  100 mg Oral Daily  . donepezil  10 mg Oral QHS  . escitalopram  10 mg Oral Daily  . furosemide  80 mg Intravenous BID  . gabapentin  100 mg Oral BID  . HYDROcodone-acetaminophen  1 tablet Oral TID  . magnesium oxide  400 mg Oral Daily  . memantine  10 mg Oral BID  . metoprolol succinate  25 mg Oral Daily  . oxybutynin  10 mg Oral QHS  . polyethylene glycol  17 g Oral Daily  . rOPINIRole  1 mg Oral 2 times per day  . sodium chloride flush  3 mL Intravenous Q12H  . spironolactone  25 mg Oral Daily  . trimethoprim  100 mg Oral Daily  . Warfarin - Pharmacist Dosing Inpatient   Does not apply q1800   Continuous Infusions: . sodium chloride     PRN Meds:.sodium chloride, HYDROcodone-acetaminophen, ondansetron (ZOFRAN) IV, sodium chloride flush Medications Prior to Admission:  Prior to Admission medications   Medication Sig Start Date End Date Taking? Authorizing Provider  acetaminophen (TYLENOL) 500 MG tablet Take 500 mg by mouth 2 (two) times daily.    Yes [provider]  diazepam (VALIUM) 2 MG tablet Take 1 tablet (2 mg total) by mouth at bedtime as needed for muscle spasms. 08/26/17  Yes Patrecia Pour, MD  diltiazem (CARDIZEM CD) 180 MG 24 hr capsule Take 1 capsule (180 mg total) by mouth daily. 09/07/17  Yes Sherran Needs, NP  diltiazem (CARDIZEM) 30 MG tablet Take 30 mg by mouth every 6 (six) hours as needed (A-FIB - hold and call MD Pulse >120).   Yes [provider]  donepezil (ARICEPT ODT) 10 MG disintegrating tablet Take 1 tablet (10 mg total) by mouth at bedtime. 06/29/16  Yes Burchette, Alinda Sierras, MD  escitalopram (LEXAPRO) 10 MG tablet TAKE 1 TABLET EVERY DAY Patient taking differently: Take 10 mg by mouth once daily 03/08/17  Yes Burchette, Alinda Sierras, MD  gabapentin (NEURONTIN) 100 MG capsule Take 100 mg by mouth at bedtime.   Yes [provider]  HYDROcodone-acetaminophen (NORCO) 7.5-325 MG tablet Take 1 tablet by mouth See admin instructions. 1 tablet three times a day and may take an additional 2 doses as needed for nighttime pain between 2000 and 0800 Patient taking differently: Take 1 tablet by mouth 3 (three) times daily. May take additional two doses as needed for nighttime pain between 2000-0800 08/26/17  Yes Patrecia Pour, MD  Magnesium 500 MG TABS Take 500 mg by mouth at bedtime.   Yes [provider]  Melatonin 5 MG TABS Take 5 mg at bedtime by mouth.    Yes [provider]  memantine (NAMENDA) 10 MG tablet Take 10 mg 2 (two) times daily by mouth.   Yes [provider]  metoprolol succinate (TOPROL-XL) 25 MG 24 hr tablet Take 1 tablet (25 mg total) by mouth daily. 08/27/17  Yes Patrecia Pour, MD  Misc. Devices (ROLLER Floral) MISC Used as Directed. 01/12/16  Yes Burchette, Alinda Sierras, MD  Multiple Vitamins-Minerals (PRESERVISION AREDS) CAPS Take 1 capsule by mouth 2 (two) times daily.   Yes [provider]  nitroGLYCERIN (NITROSTAT) 0.4 MG SL tablet Place 1 tablet (0.4  mg total) under the tongue every 5 (five) minutes as needed for chest pain. Patient taking differently: Place 0.4 mg under the tongue every 5 (five) minutes x 3 doses as needed for chest pain.  12/07/16  Yes Burtis Junes, NP  oxybutynin (DITROPAN-XL) 10 MG 24 hr tablet Take 10 mg by mouth at bedtime.  05/09/15  Yes [provider]  polyethylene glycol powder (GLYCOLAX/MIRALAX) powder Take 17 g by mouth daily. Take as needed to produce 1 normal bowel movement per day. 08/26/17  Yes Patrecia Pour, MD  rOPINIRole (REQUIP) 1 MG tablet Take 1 tablet (1 mg total) by mouth See admin instructions. Take 1 tablet (1 mg) by mouth daily at 4pm and at bedtime Patient taking differently: Take 1 mg by mouth 2 (two) times daily.  06/29/16  Yes Burchette, Alinda Sierras, MD  sennosides-docusate sodium (SENOKOT-S) 8.6-50 MG tablet Take 1 tablet by mouth daily.   Yes [provider]  spironolactone (ALDACTONE) 25 MG tablet Take 1 tablet (25 mg total) by mouth daily. 12/07/16  Yes Burtis Junes, NP  torsemide (DEMADEX) 20 MG tablet Take 1 tablet by mouth in the morning and 1/2 tablet in the evening Patient taking differently: Take 20 mg by mouth 2 (two) times daily. Take 1 tablet by mouth in the morning and 1/2 tablet in the evening 09/08/17  Yes Sherran Needs, NP  triamcinolone cream (KENALOG) 0.1 % Apply 1 application topically See admin instructions. 1 application applied topically to both breasts two times a day as needed for irritation   Yes [provider]  trimethoprim (TRIMPEX) 100 MG tablet Take 100 mg by mouth daily.  05/18/15  Yes [provider]  trolamine salicylate (ASPERCREME) 10 % cream Apply 1 application every 12 (twelve) hours as needed topically for muscle pain.   Yes [provider]  warfarin (COUMADIN) 2.5 MG tablet Take as directed by anticoagulation clinic. Patient taking differently: Take 2.5 mg by mouth every evening.  01/25/17  Yes Burchette, Alinda Sierras, MD     Allergies  Allergen Reactions  . Ciprofloxacin Nausea And Vomiting  . Demerol [Meperidine] Nausea And Vomiting  . Sulfa Antibiotics Nausea And Vomiting and Other (See Comments)    And all derivatives -  GI Bleeding (?)  . Sulfacetamide Sodium Nausea And Vomiting and Other (See Comments)    GI bleeding, also (?)   Review of Systems  Constitutional: Positive for fatigue.  Cardiovascular: Positive for leg swelling.  Musculoskeletal:       Bilateral rib pain   Physical Exam  Constitutional: She is oriented to person, place, and time. She is cooperative.  Pulmonary/Chest: Effort normal.  Musculoskeletal: She exhibits edema (BLE).  Tenderness bilateral lower ribs R>L  Neurological: She is alert and oriented to person, place, and time.  Skin: Skin is warm and dry.  Nursing note and vitals reviewed.  Vital Signs: BP 116/79 (BP Location: Left Arm)   Pulse 65   Temp 97.7 F (36.5 C) (Oral)   Resp 18   Ht 5' 1"  (1.549 m)   Wt 84.7 kg (186 lb 12.8 oz)   SpO2 94%   BMI 35.30 kg/m  Pain Assessment: 0-10   Pain Score: 6   SpO2: SpO2: 94 % O2 Device:SpO2: 94 % O2 Flow Rate: .   IO: Intake/output summary:   Intake/Output Summary (Last 24 hours) at 09/15/2017 1759 Last data filed at 09/15/2017 1331 Gross per 24 hour  Intake 960 ml  Output 2400 ml  Net -1440 ml    LBM: Last BM Date: 09/14/17 Baseline Weight: Weight: 88.5 kg (195 lb) Most recent weight: Weight: 84.7 kg (186 lb 12.8 oz)     Palliative Assessment/Data: PPS 60%  Flowsheet Rows     Most Recent Value  Intake Tab  Referral Department  Cardiology  Unit at Time of Referral  Cardiac/Telemetry Unit  Palliative Care Primary Diagnosis  Cardiac  Palliative Care Type  New Palliative care  Date first seen by Palliative Care  09/15/17  Clinical Assessment  Palliative Performance Scale Score  60%  Psychosocial & Spiritual Assessment  Palliative Care Outcomes  Patient/Family meeting held?  Yes  Who was at the  meeting?  patient and daughter Angel Meyer)  Palliative Care Outcomes  Clarified goals of care, Counseled regarding hospice, Improved pain interventions, Improved non-pain symptom therapy, Provided psychosocial or spiritual support, Linked to palliative care logitudinal support, ACP counseling assistance      Time In: 0845 Time Out: 1000 Time Total:24mn Greater than 50%  of this time was spent counseling and coordinating care related to the above assessment and plan.  Signed by:  MIhor Dow FNP-C Palliative Medicine Team  Phone: [phone]: [phone]  Please contact Palliative Medicine Team phone at [phone] questions and concerns.  For individual provider: See AShea Evans

## 2017-09-15 NOTE — Progress Notes (Signed)
Pt. Refusing IV start at this time. Fran Lowes, RN VAST

## 2017-09-15 NOTE — Consult Note (Signed)
   [facility] CM Inpatient Consult   09/15/2017  Angel Meyer 18-Jul-1933 [id number]  Patient screened for re-admission in [facility]. Patient is in the [place] [facility] services under patient's Medicare plan. Chart review that patient is from [facility] facility as a resident.  Currently, in conference with staff.  Notes reflect a palliative consult has been requested for goals of care.  Will follow.   Please place a [facility] Care Management consult or for questions contact:   Natividad Brood, RN BSN [facility] Liaison  [phone] business mobile phone Toll free office [phone]

## 2017-09-16 DIAGNOSIS — Z515 Encounter for palliative care: Secondary | ICD-10-CM

## 2017-09-16 LAB — BASIC METABOLIC PANEL
ANION GAP: 11 (ref 5–15)
BUN: 25 mg/dL — AB (ref 6–20)
CHLORIDE: 100 mmol/L — AB (ref 101–111)
CO2: 25 mmol/L (ref 22–32)
Calcium: 9.6 mg/dL (ref 8.9–10.3)
Creatinine, Ser: 1.44 mg/dL — ABNORMAL HIGH (ref 0.44–1.00)
GFR calc Af Amer: 38 mL/min — ABNORMAL LOW (ref >60)
GFR, EST NON AFRICAN AMERICAN: 33 mL/min — AB (ref >60)
GLUCOSE: 97 mg/dL (ref 65–99)
POTASSIUM: 3.5 mmol/L (ref 3.5–5.1)
Sodium: 136 mmol/L (ref 135–145)

## 2017-09-16 LAB — CBC
HCT: 42.9 % (ref 36.0–46.0)
Hemoglobin: 14.5 g/dL (ref 12.0–15.0)
MCH: 32.9 pg (ref 26.0–34.0)
MCHC: 33.8 g/dL (ref 30.0–36.0)
MCV: 97.3 fL (ref 78.0–100.0)
PLATELETS: 239 10*3/uL (ref 150–400)
RBC: 4.41 MIL/uL (ref 3.87–5.11)
RDW: 14.4 % (ref 11.5–15.5)
WBC: 7.4 10*3/uL (ref 4.0–10.5)

## 2017-09-16 LAB — PROTIME-INR
INR: 2.06
Prothrombin Time: 23.1 seconds — ABNORMAL HIGH (ref 11.4–15.2)

## 2017-09-16 MED ORDER — TORSEMIDE 20 MG PO TABS
20.0000 mg | ORAL_TABLET | Freq: Once | ORAL | Status: AC
  Administered 2017-09-16: 20 mg via ORAL
  Filled 2017-09-16: qty 1

## 2017-09-16 MED ORDER — TORSEMIDE 20 MG PO TABS
20.0000 mg | ORAL_TABLET | Freq: Two times a day (BID) | ORAL | Status: DC
  Administered 2017-09-16 – 2017-09-17 (×2): 20 mg via ORAL
  Filled 2017-09-16 (×2): qty 1

## 2017-09-16 MED ORDER — HYDROCODONE-ACETAMINOPHEN 10-325 MG PO TABS
1.0000 | ORAL_TABLET | Freq: Three times a day (TID) | ORAL | Status: DC
  Administered 2017-09-16 – 2017-09-17 (×3): 1 via ORAL
  Filled 2017-09-16 (×3): qty 1

## 2017-09-16 MED ORDER — WARFARIN SODIUM 2 MG PO TABS
4.0000 mg | ORAL_TABLET | Freq: Once | ORAL | Status: AC
  Administered 2017-09-16: 4 mg via ORAL
  Filled 2017-09-16 (×2): qty 2

## 2017-09-16 NOTE — Progress Notes (Signed)
Progress Note  Patient Name: Angel Meyer Date of Encounter: 09/16/2017  Primary Cardiologist: Dr. Rayann Heman  Subjective   Complains of right rib pain; dyspnea improved; no chest pain  Inpatient Medications    Scheduled Meds: . acetaminophen  500 mg Oral BID  . diltiazem  180 mg Oral Daily  . docusate sodium  100 mg Oral Daily  . donepezil  10 mg Oral QHS  . escitalopram  10 mg Oral Daily  . gabapentin  100 mg Oral BID  . HYDROcodone-acetaminophen  1 tablet Oral TID  . magnesium oxide  400 mg Oral Daily  . memantine  10 mg Oral BID  . metoprolol succinate  25 mg Oral Daily  . oxybutynin  10 mg Oral QHS  . polyethylene glycol  17 g Oral Daily  . rOPINIRole  1 mg Oral 2 times per day  . sodium chloride flush  3 mL Intravenous Q12H  . spironolactone  25 mg Oral Daily  . trimethoprim  100 mg Oral Daily  . warfarin  4 mg Oral ONCE-1800  . Warfarin - Pharmacist Dosing Inpatient   Does not apply q1800   Continuous Infusions: . sodium chloride     PRN Meds: sodium chloride, HYDROcodone-acetaminophen, ondansetron (ZOFRAN) IV, sodium chloride flush   Vital Signs    Vitals:   09/15/17 1200 09/15/17 1639 09/15/17 2056 09/16/17 0453  BP: 100/64 116/79 97/61 (!) 109/44  Pulse: 90 65 88 96  Resp:   18 20  Temp:   97.7 F (36.5 C) 97.8 F (36.6 C)  TempSrc:   Oral Oral  SpO2: 96% 94% 95% 95%  Weight:    184 lb 9.6 oz (83.7 kg)  Height:        Intake/Output Summary (Last 24 hours) at 09/16/2017 1042 Last data filed at 09/16/2017 0855 Gross per 24 hour  Intake 600 ml  Output 1650 ml  Net -1050 ml   Filed Weights   09/14/17 0417 09/15/17 0411 09/16/17 0453  Weight: 187 lb 3.2 oz (84.9 kg) 186 lb 12.8 oz (84.7 kg) 184 lb 9.6 oz (83.7 kg)     Physical Exam   General: Well developed, obese female NAD Head: Normal Neck: Supple  Lungs:  CTA; no wheeze Heart: irregular Abdomen: Soft, NT/ND, tender over right ribs Extremities: No edema Neuro: grossly  intact   Labs    Chemistry Recent Labs  Lab 09/12/17 1330 09/12/17 1623  09/14/17 0432 09/15/17 0827 09/16/17 0742  NA 139 139   < > 136 136 136  K 3.6 3.4*   < > 3.6 3.5 3.5  CL 100* 101   < > 99* 100* 100*  CO2 24 24   < > 22 24 25   GLUCOSE 90 80   < > 83 108* 97  BUN 28* 27*   < > 28* 25* 25*  CREATININE 1.72* 1.69*   < > 1.62* 1.38* 1.44*  CALCIUM 9.6 9.4   < > 9.3 9.3 9.6  PROT 6.6 6.1*  --   --   --   --   ALBUMIN 3.6 3.4*  --   --   --   --   AST 29 29  --   --   --   --   ALT 18 18  --   --   --   --   ALKPHOS 130* 123  --   --   --   --   BILITOT 1.2 0.9  --   --   --   --  GFRNONAA 26* 27*   < > 28* 35* 33*  GFRAA 31* 31*   < > 33* 40* 38*  ANIONGAP 15 14   < > 15 12 11    < > = values in this interval not displayed.     Hematology Recent Labs  Lab 09/14/17 0432 09/15/17 0827 09/16/17 0622  WBC 7.2 7.6 7.4  RBC 4.12 4.34 4.41  HGB 13.6 14.7 14.5  HCT 40.4 42.2 42.9  MCV 98.1 97.2 97.3  MCH 33.0 33.9 32.9  MCHC 33.7 34.8 33.8  RDW 14.6 14.4 14.4  PLT 214 199 239    Cardiac Enzymes Recent Labs  Lab 09/12/17 1330 09/12/17 1623 09/12/17 2215 09/13/17 0315  TROPONINI <0.03 <0.03 <0.03 <0.03     BNP Recent Labs  Lab 09/12/17 1253 09/12/17 1623  BNP 241.0* 232.0*      Telemetry    Afib rate controlled - Personally Reviewed   Cardiac Studies   Echo 08/25/17: Study Conclusions - Left ventricle: The cavity size was normal. Wall thickness was increased in a pattern of mild LVH. Systolic function was normal. The estimated ejection fraction was in the range of 50% to 55%. - Mitral valve: There was mild regurgitation. - Left atrium: The atrium was mildly dilated. - Pericardium, extracardiac: A trivial pericardial effusion was identified.  Patient Profile     83 y.o. female admitted with acute on chronic diastolic congestive heart failure. Last echocardiogram November 2018 showed ejection fraction 50-55%, mild mitral  regurgitation and mild left atrial enlargement.  Assessment & Plan    1. Acute on chronic diastolic heart failure - -4560 since admission; change to demadex 20 mg BID; follow renal function; continue low Na diet and fluid restiction. Pt not volume overloaded on exam.  2. CKD stage III - follow renal function with diuresis.  3. Permanent Afib - continue cardizem, metoprolol and coumadin  4. HTN - controlled; continue present meds  5. Palliative care consult appreciated. - pt continues to complain of rib pain; likely musculoskeletal; they have requested palliative care reassess for recommendations concerning pain management.  6. Abdominal pain - improved  Possible DC in AM if stable  Signed, Kirk Ruths , MD 10:42 AM 09/16/2017 Pager: [phone]

## 2017-09-16 NOTE — Progress Notes (Signed)
Daily Progress Note   Patient Name: Angel Meyer       Date: 09/16/2017 DOB: 1932-10-20  Age: 83 y.o. MRN#: [id number] Attending Physician: Lelon Perla, MD Primary Care Physician: [name], MD Admit Date: 09/12/2017  Reason for Consultation/Follow-up: Establishing goals of care, pain managment  Subjective: Angel Meyer is complaining of low right rib pain (directly under her right breast) 7/10.   Length of Stay: 3  Current Medications: Scheduled Meds:  . acetaminophen  500 mg Oral BID  . diltiazem  180 mg Oral Daily  . docusate sodium  100 mg Oral Daily  . donepezil  10 mg Oral QHS  . escitalopram  10 mg Oral Daily  . gabapentin  100 mg Oral BID  . HYDROcodone-acetaminophen  1 tablet Oral TID  . magnesium oxide  400 mg Oral Daily  . memantine  10 mg Oral BID  . metoprolol succinate  25 mg Oral Daily  . oxybutynin  10 mg Oral QHS  . polyethylene glycol  17 g Oral Daily  . rOPINIRole  1 mg Oral 2 times per day  . sodium chloride flush  3 mL Intravenous Q12H  . spironolactone  25 mg Oral Daily  . torsemide  20 mg Oral BID  . trimethoprim  100 mg Oral Daily  . warfarin  4 mg Oral ONCE-1800  . Warfarin - Pharmacist Dosing Inpatient   Does not apply q1800    Continuous Infusions: . sodium chloride      PRN Meds: sodium chloride, HYDROcodone-acetaminophen, ondansetron (ZOFRAN) IV, sodium chloride flush  Physical Exam  Constitutional: She is oriented to person, place, and time. She appears well-developed and well-nourished.  HENT:  Head: Normocephalic and atraumatic.  Cardiovascular: Normal rate. An irregularly irregular rhythm present.  Pulmonary/Chest: Effort normal. No accessory muscle usage. No tachypnea. No respiratory distress.  Occasional dry  cough Tenderness/pain to palpation under right breast  Abdominal: Soft. Normal appearance. She exhibits no distension. There is no tenderness.  Neurological: She is alert and oriented to person, place, and time.  Nursing note and vitals reviewed.           Vital Signs: BP 119/88 (BP Location: Left Arm)   Pulse 99   Temp 97.7 F (36.5 C) (Oral)   Resp 20   Ht 5' 1"  (1.549  m)   Wt 83.7 kg (184 lb 9.6 oz)   SpO2 96%   BMI 34.88 kg/m  SpO2: SpO2: 96 % O2 Device: O2 Device: Not Delivered O2 Flow Rate:    Intake/output summary:   Intake/Output Summary (Last 24 hours) at 09/16/2017 1311 Last data filed at 09/16/2017 1126 Gross per 24 hour  Intake 600 ml  Output 1600 ml  Net -1000 ml   LBM: Last BM Date: 09/15/17 Baseline Weight: Weight: 88.5 kg (195 lb) Most recent weight: Weight: 83.7 kg (184 lb 9.6 oz)       Palliative Assessment/Data: 60%    Flowsheet Rows     Most Recent Value  Intake Tab  Referral Department  Cardiology  Unit at Time of Referral  Cardiac/Telemetry Unit  Palliative Care Primary Diagnosis  Cardiac  Palliative Care Type  New Palliative care  Date first seen by Palliative Care  09/15/17  Clinical Assessment  Palliative Performance Scale Score  60%  Psychosocial & Spiritual Assessment  Palliative Care Outcomes  Patient/Family meeting held?  Yes  Who was at the meeting?  patient and daughter Angel Meyer)  Palliative Care Outcomes  Clarified goals of care, Counseled regarding hospice, Improved pain interventions, Improved non-pain symptom therapy, Provided psychosocial or spiritual support, Linked to palliative care logitudinal support, ACP counseling assistance      Patient Active Problem List   Diagnosis Date Noted  . Dyspnea   . Rib pain   . Palliative care by specialist   . Acute diastolic heart failure ([place]) 09/12/2017  . Abdominal pain 08/23/2017  . Cholelithiasis 08/23/2017  . Acute on chronic diastolic heart failure ([place]) 08/23/2017  . CHF  exacerbation ([place]) 08/23/2017  . Long term (current) use of anticoagulants 06/08/2017  . Lichen simplex chronicus 04/29/2017  . Risk for falls 12/17/2015  . Urinary urgency 10/29/2014  . Cognitive impairment 07/18/2014  . Goals of care, counseling/discussion 10/18/2013  . Lung nodule 10/03/2013  . CHF (congestive heart failure) ([place]) 09/08/2013  . Cough 08/07/2013  . Low blood pressure reading 08/07/2013  . Cerumen impaction 08/07/2013  . Anticoagulant long-term use 07/27/2013  . Acute upper respiratory infections of unspecified site 07/27/2013  . Wheezy bronchitis 07/27/2013  . OSA (obstructive sleep apnea) 12/20/2011  . Hypertension 12/20/2011  . Hyperlipidemia 12/20/2011  . Osteoarthritis 12/20/2011  . History of kidney stones 12/20/2011  . Osteoporosis 12/20/2011  . Urinary incontinence, urge 12/20/2011  . Restless leg syndrome 12/20/2011  . Atrial fibrillation ([place]) 12/20/2011    Palliative Care Assessment & Plan   HPI: 83 y.o. female  with past medical history of systolic CHF with EF 42-68%, afib on coumadin, multiple cardioversion's, restless leg, OSA, HTN, HLD, mild dementia admitted on 09/12/2017 with swelling and abdominal pain. Patient with acute on chronic CHF. Since admission, diuresing well on Lasix 61m IV BID. Also with CKD III and monitoring renal function. Permanent atrial fibrillation on coumadin, cardizem, and metoprolol. Patient complains of rib pain. Abdominal CT reveals cholelithiasis without gallbladder inflammation. Notes reviewed from recent hospitalization. Rib pain felt to be musculoskeletal since tender to palpation and worse with cough. Receiving scheduled Norco. Palliative medicine consultation for goals of care/symptom management.  Assessment: I met today with Ms. BPickerilland daughter, TLarena Meyer They tell me about her low right rib pain under right breast. This pain is tender to the touch. She struggles to describe the pain. She follows with Dr. RNelva Bushfor  pain management but has had difficulty with recent appointments d/Angel hospital admissions. Her pain  has been worse today. Norco is most effective but now only lasting ~2-3 hours before pain escalating back to 7-8/10. She takes Tylenol between Norco doses as well. Both deny any side effect from Angel Meyer and does not make her sleepy or confused. We agreed to increase Norco and monitor closely. Especially since their goal is mainly for comfort and QOL at this stage. Their desire is for pain to be controlled for her to return to ALF and participate in social activities there. I will follow up on tolerance of increased Norco tomorrow.   Recommendations/Plan:  Recommend heat pack prn and increase activity, ROM, movement as well.   Gabapentin 161m PO BID increased 12/20  Continue Acetaminophen 5050mPO BID  Norco increased to 10-32589mO TID  May have Norco 7.5-325mg PO q8h prn severe pain  Daily Miralax and Colace, LBM 12/20  Goals of Care and Additional Recommendations:  Limitations on Scope of Treatment: No Artificial Feeding  Code Status:  DNR  Prognosis:   Unable to determine  Discharge Planning:  Return to ALF with outpatient palliative follow up, PT f/u  Thank you for allowing the Palliative Medicine Team to assist in the care of this patient.   Total Time 20 min Prolonged Time Billed  no       Greater than 50%  of this time was spent counseling and coordinating care related to the above assessment and plan.  AliVinie SillP Palliative Medicine Team Pager # [phone] 8a-5p) Team Phone # [phone]/Weekends)

## 2017-09-16 NOTE — Progress Notes (Signed)
Received message that the patient needs a hospital bed at discharge; CM called [facility] and talked to [place] / and the RN on the floor; they stated that they have an order for a hospital but her PCP needs to sign the order. No other needs identified at this time. Mindi Slicker RN,MHA,BSn [phone]

## 2017-09-16 NOTE — Care Management Important Message (Signed)
Important Message  Patient Details  Name: Angel Meyer MRN: [id number] Date of Birth: 10/12/1932   Medicare Important Message Given:  Yes    [name] Montine Circle 09/16/2017, 1:41 PM

## 2017-09-16 NOTE — Clinical Social Work Note (Addendum)
CSW spoke with Jenny Reichmann at [facility] regarding getting a hospital bed. She stated that the patient has an order for one already but there was some sort of barrier to that. She will find out for sure and call CSW back with information. CSW also notified her that patient will need outpatient palliative services as well.  Dayton Scrape, [place] [phone]  12:25 pm Received call back from [place]. She stated we would need to order hospital bed and have it delivered to the facility. RNCM notified. Per attending MD's note, patient will likely discharge tomorrow. Jenny Reichmann stated they do not do weekend discharges because their pharmacy is closed but did say if they can get a preliminary discharge summary and FL2 they will consider it. The pharmacy closes at 3:00 pm today. CSW sent a text page to PA to notify.  Dayton Scrape, [place] [phone]  3:06 pm PA stated that palliative is supposed to make some medication adjustments so he did not feel comfortable doing a preliminary discharge summary yet. ALF notified.  Dayton Scrape, [place] [phone]  3:59 pm Discussed case with director of social work. She stated that if need be, they can involve the facilities executive director if they refuse to take the patient back due to inability to obtain medications. CSW spoke with staff regarding possible solutions and asked if patient's daughter could have any new or changed medications filled at a regular pharmacy and bring them in. They will discuss with administration to get approval and call CSW back. CSW called patient's daughter to provide update. She is agreeable to filling the prescriptions at a local pharmacy if needed. She also stated that the facility asked for the hospital bed order 10 days ago. See RNCM's note from today.  Dayton Scrape, CSW [phone]  4:18 pm CSW sent H&P and preliminary FL2 to Dottie at ALF.  Dayton Scrape, CSW [phone]  4:44 pm ALF can take her back tomorrow. Daughter will  fill any new/adjusted medications at [facility]. They want her at the facility by 1:00 pm tomorrow. CSW included info in sticky note for MD.  Dayton Scrape, [place]

## 2017-09-16 NOTE — Progress Notes (Signed)
Per pt and family request, palliative care services has been text paged regarding pt's pain management. Family wants palliative care to reassess pain management. Pt continues to complain of rib cage pain. Scheduled pain med given and pt has already had PRN pain med earlier this AM.

## 2017-09-16 NOTE — NC FL2 (Signed)
[place] LEVEL OF CARE SCREENING TOOL     IDENTIFICATION  Patient Name: Angel Meyer Birthdate: 1933-06-14 Sex: female Admission Date (Current Location): 09/12/2017  [facility] and [place] Number:  Herbalist and Address:  The . [facility], [place] [address] [place], [place] [address]      Provider Number: [id number]  Attending Physician Name and Address:  Lelon Perla, MD  Relative Name and Phone Number:       Current Level of Care: Hospital Recommended Level of Care: Assisted Living Facility(with palliative follow up) Prior Approval Number:    Date Approved/Denied:   PASRR Number:    Discharge Plan: Other (Comment)(ALF with palliative follow up)    Current Diagnoses: Patient Active Problem List   Diagnosis Date Noted  . Palliative care encounter   . Dyspnea   . Rib pain   . Palliative care by specialist   . Acute diastolic heart failure ([place]) 09/12/2017  . Abdominal pain 08/23/2017  . Cholelithiasis 08/23/2017  . Acute on chronic diastolic heart failure ([place]) 08/23/2017  . CHF exacerbation ([place]) 08/23/2017  . Long term (current) use of anticoagulants 06/08/2017  . Lichen simplex chronicus 04/29/2017  . Risk for falls 12/17/2015  . Urinary urgency 10/29/2014  . Cognitive impairment 07/18/2014  . Goals of care, counseling/discussion 10/18/2013  . Lung nodule 10/03/2013  . CHF (congestive heart failure) ([place]) 09/08/2013  . Cough 08/07/2013  . Low blood pressure reading 08/07/2013  . Cerumen impaction 08/07/2013  . Anticoagulant long-term use 07/27/2013  . Acute upper respiratory infections of unspecified site 07/27/2013  . Wheezy bronchitis 07/27/2013  . OSA (obstructive sleep apnea) 12/20/2011  . Hypertension 12/20/2011  . Hyperlipidemia 12/20/2011  . Osteoarthritis 12/20/2011  . History of kidney stones 12/20/2011  . Osteoporosis 12/20/2011  . Urinary incontinence, urge 12/20/2011  . Restless leg syndrome  12/20/2011  . Atrial fibrillation ([place]) 12/20/2011    Orientation RESPIRATION BLADDER Height & Weight     Self, Time, Situation, Place  Normal Continent Weight: 184 lb 9.6 oz (83.7 kg) Height:  5\' 1"  (154.9 cm)  BEHAVIORAL SYMPTOMS/MOOD NEUROLOGICAL BOWEL NUTRITION STATUS  (None) (None) Continent Diet(Heart healthy)  AMBULATORY STATUS COMMUNICATION OF NEEDS Skin     Verbally Skin abrasions, Bruising, Other (Comment)(Skin tear. )                       Personal Care Assistance Level of Assistance              Functional Limitations Info  Sight, Hearing, Speech Sight Info: Adequate Hearing Info: Adequate Speech Info: Adequate    SPECIAL CARE FACTORS FREQUENCY                       Contractures Contractures Info: Not present    Additional Factors Info  Code Status, Allergies Code Status Info: DNR Allergies Info: Ciprofloxacin, Demerol (Meperidine), Sulfa Antibiotics, Sulfacetamide Sodium           Current Medications (09/16/2017):  This is the current hospital active medication list Current Facility-Administered Medications  Medication Dose Route Frequency Provider Last Rate Last Dose  . 0.9 %  sodium chloride infusion  250 mL Intravenous PRN Truitt Merle C, NP      . acetaminophen (TYLENOL) tablet 500 mg  500 mg Oral BID Lelon Perla, MD   500 mg at 09/16/17 1327  . diltiazem (CARDIZEM CD) 24 hr capsule 180 mg  180 mg  Oral Daily Burtis Junes, NP   180 mg at 09/16/17 7782  . docusate sodium (COLACE) capsule 100 mg  100 mg Oral Daily Truitt Merle C, NP   100 mg at 09/16/17 4235  . donepezil (ARICEPT) tablet 10 mg  10 mg Oral QHS Truitt Merle C, NP   10 mg at 09/15/17 2110  . escitalopram (LEXAPRO) tablet 10 mg  10 mg Oral Daily Truitt Merle C, NP   10 mg at 09/16/17 0958  . gabapentin (NEURONTIN) capsule 100 mg  100 mg Oral BID Basilio Cairo, NP   100 mg at 09/16/17 3614  . HYDROcodone-acetaminophen (NORCO) 10-325 MG per tablet 1 tablet  1  tablet Oral TID Pershing Proud, NP      . HYDROcodone-acetaminophen (NORCO) 7.5-325 MG per tablet 1 tablet  1 tablet Oral Q8H PRN Ledora Bottcher, PA   1 tablet at 09/16/17 4315  . magnesium oxide (MAG-OX) tablet 400 mg  400 mg Oral Daily Truitt Merle C, NP   400 mg at 09/16/17 0958  . memantine (NAMENDA) tablet 10 mg  10 mg Oral BID Truitt Merle C, NP   10 mg at 09/16/17 0958  . metoprolol succinate (TOPROL-XL) 24 hr tablet 25 mg  25 mg Oral Daily Truitt Merle C, NP   25 mg at 09/16/17 0958  . ondansetron (ZOFRAN) injection 4 mg  4 mg Intravenous Q6H PRN Truitt Merle C, NP      . oxybutynin (DITROPAN-XL) 24 hr tablet 10 mg  10 mg Oral QHS Truitt Merle C, NP   10 mg at 09/15/17 2110  . polyethylene glycol (MIRALAX / GLYCOLAX) packet 17 g  17 g Oral Daily Truitt Merle C, NP   17 g at 09/16/17 0959  . rOPINIRole (REQUIP) tablet 1 mg  1 mg Oral 2 times per day Burtis Junes, NP   1 mg at 09/15/17 2110  . sodium chloride flush (NS) 0.9 % injection 3 mL  3 mL Intravenous PRN Truitt Merle C, NP      . sodium chloride flush (NS) 0.9 % injection 3 mL  3 mL Intravenous Q12H Truitt Merle C, NP   3 mL at 09/16/17 1000  . spironolactone (ALDACTONE) tablet 25 mg  25 mg Oral Daily Truitt Merle C, NP   25 mg at 09/16/17 0958  . torsemide (DEMADEX) tablet 20 mg  20 mg Oral BID Lelon Perla, MD      . trimethoprim (TRIMPEX) tablet 100 mg  100 mg Oral Daily Truitt Merle C, NP   100 mg at 09/16/17 0958  . warfarin (COUMADIN) tablet 4 mg  4 mg Oral ONCE-1800 Bajbus, Lauren D, RPH      . Warfarin - Pharmacist Dosing Inpatient   Does not apply q1800 Carney, Gay Filler, [facility]         Discharge Medications: Please see discharge summary for a list of discharge medications.  Relevant Imaging Results:  Relevant Lab Results:   Additional Information SS#: [id number]. Needs hospital bed.  Candie Chroman, LCSW

## 2017-09-16 NOTE — Progress Notes (Signed)
ANTICOAGULATION CONSULT NOTE - Follow Up Consult  Pharmacy Consult for Coumadin Indication: atrial fibrillation  Allergies  Allergen Reactions  . Ciprofloxacin Nausea And Vomiting  . Demerol [Meperidine] Nausea And Vomiting  . Sulfa Antibiotics Nausea And Vomiting and Other (See Comments)    And all derivatives -  GI Bleeding (?)  . Sulfacetamide Sodium Nausea And Vomiting and Other (See Comments)    GI bleeding, also (?)    Patient Measurements: Height: 5\' 1"  (154.9 cm) Weight: 184 lb 9.6 oz (83.7 kg) IBW/kg (Calculated) : 47.8  Vital Signs: Temp: 97.8 F (36.6 C) (12/21 0453) Temp Source: Oral (12/21 0453) BP: 109/44 (12/21 0453) Pulse Rate: 96 (12/21 0453)  Labs: Recent Labs    09/14/17 0432 09/15/17 0827 09/16/17 0622 09/16/17 0742  HGB 13.6 14.7 14.5  --   HCT 40.4 42.2 42.9  --   PLT 214 199 239  --   LABPROT 22.9* 24.4*  --  23.1*  INR 2.05 2.22  --  2.06  CREATININE 1.62* 1.38*  --  1.44*    Estimated Creatinine Clearance: 29.6 mL/min (A) (by C-G formula based on SCr of 1.44 mg/dL (H)).  Assessment: 83 yr old female continues on Coumadin for afib.  Home Coumadin regimen: 2.5 mg daily.  INR 2.3 on admit 09/12/17.  Missed dose on 12/18 and received two doses on 12/19 to make up for that. INR today 2.06. Hgb and plts normal, no bleeding noted.  Goal of Therapy:  INR 2-3 Monitor platelets by anticoagulation protocol: Yes   Plan:  Warfarin 4mg  po x1 tonight Daily INR Follow s/s bleeding  Marieta Markov D. Yazir Koerber, PharmD, BCPS Clinical Pharmacist Clinical Phone for 09/16/2017 until 3:30pm: [phone] If after 3:30pm, please call main pharmacy at [phone] 09/16/2017 10:39 AM

## 2017-09-17 ENCOUNTER — Encounter (HOSPITAL_COMMUNITY): Payer: Self-pay | Admitting: Physician Assistant

## 2017-09-17 ENCOUNTER — Other Ambulatory Visit: Payer: Self-pay | Admitting: Physician Assistant

## 2017-09-17 DIAGNOSIS — N189 Chronic kidney disease, unspecified: Secondary | ICD-10-CM

## 2017-09-17 LAB — CBC
HCT: 43 % (ref 36.0–46.0)
Hemoglobin: 14.6 g/dL (ref 12.0–15.0)
MCH: 33 pg (ref 26.0–34.0)
MCHC: 34 g/dL (ref 30.0–36.0)
MCV: 97.1 fL (ref 78.0–100.0)
PLATELETS: 201 10*3/uL (ref 150–400)
RBC: 4.43 MIL/uL (ref 3.87–5.11)
RDW: 14.3 % (ref 11.5–15.5)
WBC: 7 10*3/uL (ref 4.0–10.5)

## 2017-09-17 LAB — BASIC METABOLIC PANEL
Anion gap: 12 (ref 5–15)
BUN: 28 mg/dL — ABNORMAL HIGH (ref 6–20)
CO2: 24 mmol/L (ref 22–32)
Calcium: 9.4 mg/dL (ref 8.9–10.3)
Chloride: 100 mmol/L — ABNORMAL LOW (ref 101–111)
Creatinine, Ser: 1.64 mg/dL — ABNORMAL HIGH (ref 0.44–1.00)
GFR calc Af Amer: 33 mL/min — ABNORMAL LOW (ref >60)
GFR calc non Af Amer: 28 mL/min — ABNORMAL LOW (ref >60)
Glucose, Bld: 92 mg/dL (ref 65–99)
Potassium: 3.5 mmol/L (ref 3.5–5.1)
Sodium: 136 mmol/L (ref 135–145)

## 2017-09-17 LAB — PROTIME-INR
INR: 2.36
Prothrombin Time: 25.6 seconds — ABNORMAL HIGH (ref 11.4–15.2)

## 2017-09-17 MED ORDER — GABAPENTIN 100 MG PO CAPS: 100.0000 mg | ORAL_CAPSULE | Freq: Two times a day (BID) | ORAL | 3 refills | Status: DC

## 2017-09-17 MED ORDER — HYDROCODONE-ACETAMINOPHEN 10-325 MG PO TABS: 1.0000 | ORAL_TABLET | ORAL | 0 refills | Status: DC | PRN

## 2017-09-17 MED ORDER — HYDROCODONE-ACETAMINOPHEN 7.5-325 MG PO TABS: 1.0000 | ORAL_TABLET | ORAL | 0 refills | Status: DC | PRN

## 2017-09-17 MED ORDER — GABAPENTIN 100 MG PO CAPS: 100.0000 mg | ORAL_CAPSULE | Freq: Two times a day (BID) | ORAL | 0 refills | Status: DC

## 2017-09-17 MED ORDER — TORSEMIDE 20 MG PO TABS: 20.0000 mg | ORAL_TABLET | Freq: Two times a day (BID) | ORAL | 0 refills | Status: DC

## 2017-09-17 MED ORDER — HYDROCODONE-ACETAMINOPHEN 10-325 MG PO TABS: 1.0000 | ORAL_TABLET | Freq: Three times a day (TID) | ORAL | 0 refills | Status: DC

## 2017-09-17 MED ORDER — TORSEMIDE 20 MG PO TABS: 20.0000 mg | ORAL_TABLET | Freq: Two times a day (BID) | ORAL | 3 refills | Status: DC

## 2017-09-17 MED ORDER — WARFARIN SODIUM 2.5 MG PO TABS: 2.5000 mg | ORAL_TABLET | Freq: Once | ORAL | Status: DC

## 2017-09-17 NOTE — Progress Notes (Signed)
Pt is been discharge back to [facility], report given to the nurse there, vitals are stable, discharge instructions and summary review with daugther and pt, denied any concerns at this time.Pt daughter is transporting her to facility.

## 2017-09-17 NOTE — Progress Notes (Addendum)
Daily Progress Note   Patient Name: Angel Meyer       Date: 09/17/2017 DOB: August 02, 1933  Age: 83 y.o. MRN#: [id number] Attending Physician: Lelon Perla, MD Primary Care Physician: [name], MD Admit Date: 09/12/2017  Reason for Consultation/Follow-up: Establishing goals of care, pain managment  Subjective: Angel Meyer is looking forward to returning home today. Rib pain is much better and only hurts with deep breathe or cough per Angel Meyer.   Length of Stay: 4  Current Medications: Scheduled Meds:  . acetaminophen  500 mg Oral BID  . diltiazem  180 mg Oral Daily  . docusate sodium  100 mg Oral Daily  . donepezil  10 mg Oral QHS  . escitalopram  10 mg Oral Daily  . gabapentin  100 mg Oral BID  . HYDROcodone-acetaminophen  1 tablet Oral TID  . magnesium oxide  400 mg Oral Daily  . memantine  10 mg Oral BID  . metoprolol succinate  25 mg Oral Daily  . oxybutynin  10 mg Oral QHS  . polyethylene glycol  17 g Oral Daily  . rOPINIRole  1 mg Oral 2 times per day  . sodium chloride flush  3 mL Intravenous Q12H  . spironolactone  25 mg Oral Daily  . torsemide  20 mg Oral BID  . trimethoprim  100 mg Oral Daily  . Warfarin - Pharmacist Dosing Inpatient   Does not apply q1800    Continuous Infusions: . sodium chloride      PRN Meds: sodium chloride, HYDROcodone-acetaminophen, ondansetron (ZOFRAN) IV, sodium chloride flush  Physical Exam  Constitutional: She is oriented to person, place, and time. She appears well-developed and well-nourished.  HENT:  Head: Normocephalic and atraumatic.  Cardiovascular: Normal rate. An irregularly irregular rhythm present.  Pulmonary/Chest: Effort normal. No accessory muscle usage. No tachypnea. No respiratory distress.  Occasional  dry cough Tenderness/pain to palpation under right breast  Abdominal: Soft. Normal appearance. She exhibits no distension. There is no tenderness.  Neurological: She is alert and oriented to person, place, and time.  Nursing note and vitals reviewed.           Vital Signs: BP 113/80 (BP Location: Left Arm)   Pulse 81   Temp (!) 97.5 F (36.4 C) (Oral)   Resp 20   Ht  5' 1"  (1.549 m)   Wt 83.3 kg (183 lb 11.2 oz) Comment: scale b  SpO2 95%   BMI 34.71 kg/m  SpO2: SpO2: 95 % O2 Device: O2 Device: Not Delivered O2 Flow Rate:    Intake/output summary:   Intake/Output Summary (Last 24 hours) at 09/17/2017 0174 Last data filed at 09/17/2017 0100 Gross per 24 hour  Intake 360 ml  Output 700 ml  Net -340 ml   LBM: Last BM Date: 09/16/17 Baseline Weight: Weight: 88.5 kg (195 lb) Most recent weight: Weight: 83.3 kg (183 lb 11.2 oz)(scale b)       Palliative Assessment/Data: 60%    Flowsheet Rows     Most Recent Value  Intake Tab  Referral Department  Cardiology  Unit at Time of Referral  Cardiac/Telemetry Unit  Palliative Care Primary Diagnosis  Cardiac  Date Notified  09/14/17  Palliative Care Type  New Palliative care  Reason for referral  Pain  Date of Admission  09/12/17  Date first seen by Palliative Care  09/15/17  # of days Palliative referral response time  1 Day(s)  # of days IP prior to Palliative referral  2  Clinical Assessment  Palliative Performance Scale Score  60%  Psychosocial & Spiritual Assessment  Palliative Care Outcomes  Patient/Family meeting held?  Yes  Who was at the meeting?  patient and daughter [name])  Palliative Care Outcomes  Clarified goals of care, Counseled regarding hospice, Improved pain interventions, Improved non-pain symptom therapy, Provided psychosocial or spiritual support, Linked to palliative care logitudinal support, ACP counseling assistance      Patient Active Problem List   Diagnosis Date Noted  . Palliative care  encounter   . Dyspnea   . Rib pain   . Palliative care by specialist   . Acute diastolic heart failure ([place]) 09/12/2017  . Abdominal pain 08/23/2017  . Cholelithiasis 08/23/2017  . Acute on chronic diastolic heart failure ([place]) 08/23/2017  . CHF exacerbation ([place]) 08/23/2017  . Long term (current) use of anticoagulants 06/08/2017  . Lichen simplex chronicus 04/29/2017  . Risk for falls 12/17/2015  . Urinary urgency 10/29/2014  . Cognitive impairment 07/18/2014  . Goals of care, counseling/discussion 10/18/2013  . Lung nodule 10/03/2013  . CHF (congestive heart failure) ([place]) 09/08/2013  . Cough 08/07/2013  . Low blood pressure reading 08/07/2013  . Cerumen impaction 08/07/2013  . Anticoagulant long-term use 07/27/2013  . Acute upper respiratory infections of unspecified site 07/27/2013  . Wheezy bronchitis 07/27/2013  . OSA (obstructive sleep apnea) 12/20/2011  . Hypertension 12/20/2011  . Hyperlipidemia 12/20/2011  . Osteoarthritis 12/20/2011  . History of kidney stones 12/20/2011  . Osteoporosis 12/20/2011  . Urinary incontinence, urge 12/20/2011  . Restless leg syndrome 12/20/2011  . Atrial fibrillation ([place]) 12/20/2011    Palliative Care Assessment & Plan   HPI: 83 y.o. female  with past medical history of systolic CHF with EF 94-49%, afib on coumadin, multiple cardioversion's, restless leg, OSA, HTN, HLD, mild dementia admitted on 09/12/2017 with swelling and abdominal pain. Patient with acute on chronic CHF. Since admission, diuresing well on Lasix 16m IV BID. Also with CKD III and monitoring renal function. Permanent atrial fibrillation on coumadin, cardizem, and metoprolol. Patient complains of rib pain. Abdominal CT reveals cholelithiasis without gallbladder inflammation. Notes reviewed from recent hospitalization. Rib pain felt to be musculoskeletal since tender to palpation and worse with cough. Receiving scheduled Norco. Palliative medicine consultation for goals of  care/symptom management.  Assessment: I met today  with Angel Meyer and daughter, Angel Meyer. They tell me about her low right rib pain under right breast. This pain is tender to the touch. She struggles to describe the pain. She follows with Dr. Nelva Bush for pain management but has had difficulty with recent appointments d/t hospital admissions. Her pain has been worse today. Norco is most effective but now only lasting ~2-3 hours before pain escalating back to 7-8/10. She takes Tylenol between Norco doses as well. Both deny any side effect from [place] and does not make her sleepy or confused. We agreed to increase Norco and monitor closely. Especially since their goal is mainly for comfort and QOL at this stage. Their desire is for pain to be controlled for her to return to ALF and participate in social activities there. I will follow up on tolerance of increased Norco tomorrow.   Recommendations/Plan:  Recommend heat pack prn and increase activity, ROM, movement as well.   Gabapentin 122m PO BID  Continue Acetaminophen 5037mPO BID  Norco 10-32568mO TID - she had good response with this dose and tolerated well, continue increased scheduled dose  May have Norco 7.5-325mg PO 2000-0800 q4h prn (per regular home dose schedule)  Daily Miralax and Colace, LBM 12/20  Daughter will need prescriptions for scheduled Norco, gabapentin, torsemide (daughter plans to pick up prescriptions given weekend/holiday pharmacy restrictions at ALF)  Goals of Care and Additional Recommendations:  Limitations on Scope of Treatment: No Artificial Feeding  Code Status:  DNR  Prognosis:   Unable to determine  Discharge Planning:  Return to ALF with outpatient palliative follow up, PT f/u  Thank you for allowing the Palliative Medicine Team to assist in the care of this patient.   Total Time 20 min Prolonged Time Billed  no       Greater than 50%  of this time was spent counseling and coordinating care  related to the above assessment and plan.  AliVinie SillP Palliative Medicine Team Pager # [phone] 8a-5p) Team Phone # [phone]/Weekends)

## 2017-09-17 NOTE — Progress Notes (Signed)
ANTICOAGULATION CONSULT NOTE - Follow Up Consult  Pharmacy Consult for Coumadin Indication: atrial fibrillation  Allergies  Allergen Reactions  . Ciprofloxacin Nausea And Vomiting  . Demerol [Meperidine] Nausea And Vomiting  . Sulfa Antibiotics Nausea And Vomiting and Other (See Comments)    And all derivatives -  GI Bleeding (?)  . Sulfacetamide Sodium Nausea And Vomiting and Other (See Comments)    GI bleeding, also (?)    Patient Measurements: Height: 5\' 1"  (154.9 cm) Weight: 183 lb 11.2 oz (83.3 kg)(scale b) IBW/kg (Calculated) : 47.8  Vital Signs: Temp: 97.8 F (36.6 C) (12/22 1051) Temp Source: Oral (12/22 1051) BP: 123/70 (12/22 1051) Pulse Rate: 54 (12/22 1051)  Labs: Recent Labs    09/15/17 0827 09/16/17 0622 09/16/17 0742 09/17/17 0635  HGB 14.7 14.5  --  14.6  HCT 42.2 42.9  --  43.0  PLT 199 239  --  201  LABPROT 24.4*  --  23.1* 25.6*  INR 2.22  --  2.06 2.36  CREATININE 1.38*  --  1.44* 1.64*    Estimated Creatinine Clearance: 25.9 mL/min (A) (by C-G formula based on SCr of 1.64 mg/dL (H)).  Assessment: 83 yr old female continues on Coumadin for afib.  Home Coumadin regimen: 2.5 mg daily.  INR 2.3 on admit 09/12/17.  Missed dose on 12/18 and received two doses on 12/19 to make up for that. INR today remains therapeutic. CBC wnl, no bleeding noted.  Goal of Therapy:  INR 2-3 Monitor platelets by anticoagulation protocol: Yes   Plan:  Warfarin 2.5mg  po x 1 tonight Daily INR Monitor CBC, s/s bleeding  Elicia Lamp, PharmD, BCPS Clinical Pharmacist Clinical phone for 09/17/2017 until 3:30pm: [phone] If after 3:30pm, please call main pharmacy at: [phone] 09/17/2017 11:25 AM

## 2017-09-17 NOTE — Progress Notes (Signed)
Clinical Social Worker facilitated patient discharge including contacting patient family and facility to confirm patient discharge plans.  Clinical information faxed to facility and family agreeable with plan.  Patients daughter will be transporting   patient back to [facility].  RN to call [phone] for report prior to discharge.  Clinical Social Worker will sign off for now as social work intervention is no longer needed. Please consult [place] again if new need arises.  Rhea Pink, MSW, [place]

## 2017-09-17 NOTE — Progress Notes (Addendum)
Progress Note  Patient Name: Angel Meyer Date of Encounter: 09/17/2017  Primary Cardiologist: Dr. Rayann Heman  Subjective   No dyspnea or CP; right rib pain continues  Inpatient Medications    Scheduled Meds: . acetaminophen  500 mg Oral BID  . diltiazem  180 mg Oral Daily  . docusate sodium  100 mg Oral Daily  . donepezil  10 mg Oral QHS  . escitalopram  10 mg Oral Daily  . gabapentin  100 mg Oral BID  . HYDROcodone-acetaminophen  1 tablet Oral TID  . magnesium oxide  400 mg Oral Daily  . memantine  10 mg Oral BID  . metoprolol succinate  25 mg Oral Daily  . oxybutynin  10 mg Oral QHS  . polyethylene glycol  17 g Oral Daily  . rOPINIRole  1 mg Oral 2 times per day  . sodium chloride flush  3 mL Intravenous Q12H  . spironolactone  25 mg Oral Daily  . torsemide  20 mg Oral BID  . trimethoprim  100 mg Oral Daily  . Warfarin - Pharmacist Dosing Inpatient   Does not apply q1800   Continuous Infusions: . sodium chloride     PRN Meds: sodium chloride, HYDROcodone-acetaminophen, ondansetron (ZOFRAN) IV, sodium chloride flush   Vital Signs    Vitals:   09/16/17 0453 09/16/17 1126 09/16/17 1923 09/17/17 0526  BP: (!) 109/44 119/88 110/81 (P) 113/80  Pulse: 96 99 87 (P) 81  Resp: 20 20 20  (P) 20  Temp: 97.8 F (36.6 C) 97.7 F (36.5 C) 97.8 F (36.6 C) (!) (P) 97.5 F (36.4 C)  TempSrc: Oral Oral Oral (P) Oral  SpO2: 95% 96% 95% (P) 95%  Weight: 184 lb 9.6 oz (83.7 kg)   (P) 183 lb 11.2 oz (83.3 kg)  Height:        Intake/Output Summary (Last 24 hours) at 09/17/2017 0825 Last data filed at 09/17/2017 0100 Gross per 24 hour  Intake 360 ml  Output 900 ml  Net -540 ml   Filed Weights   09/15/17 0411 09/16/17 0453 09/17/17 0526  Weight: 186 lb 12.8 oz (84.7 kg) 184 lb 9.6 oz (83.7 kg) (P) 183 lb 11.2 oz (83.3 kg)     Physical Exam   General: WD obese Head: normal  Neck: Supple, no JVD  Lungs:  CTA; no rhonchi; tender over right ribs Heart:  irregular Abdomen: Soft, NT/ND; tender over right ribs Extremities: No edema Neuro: no focal findings   Labs    Chemistry Recent Labs  Lab 09/12/17 1330 09/12/17 1623  09/15/17 0827 09/16/17 0742 09/17/17 0635  NA 139 139   < > 136 136 136  K 3.6 3.4*   < > 3.5 3.5 3.5  CL 100* 101   < > 100* 100* 100*  CO2 24 24   < > 24 25 24   GLUCOSE 90 80   < > 108* 97 92  BUN 28* 27*   < > 25* 25* 28*  CREATININE 1.72* 1.69*   < > 1.38* 1.44* 1.64*  CALCIUM 9.6 9.4   < > 9.3 9.6 9.4  PROT 6.6 6.1*  --   --   --   --   ALBUMIN 3.6 3.4*  --   --   --   --   AST 29 29  --   --   --   --   ALT 18 18  --   --   --   --   [facility]  130* 123  --   --   --   --   BILITOT 1.2 0.9  --   --   --   --   GFRNONAA 26* 27*   < > 35* 33* 28*  GFRAA 31* 31*   < > 40* 38* 33*  ANIONGAP 15 14   < > 12 11 12    < > = values in this interval not displayed.     Hematology Recent Labs  Lab 09/15/17 0827 09/16/17 0622 09/17/17 0635  WBC 7.6 7.4 7.0  RBC 4.34 4.41 4.43  HGB 14.7 14.5 14.6  HCT 42.2 42.9 43.0  MCV 97.2 97.3 97.1  MCH 33.9 32.9 33.0  MCHC 34.8 33.8 34.0  RDW 14.4 14.4 14.3  PLT 199 239 201    Cardiac Enzymes Recent Labs  Lab 09/12/17 1330 09/12/17 1623 09/12/17 2215 09/13/17 0315  TROPONINI <0.03 <0.03 <0.03 <0.03     BNP Recent Labs  Lab 09/12/17 1253 09/12/17 1623  BNP 241.0* 232.0*      Telemetry    Afib rate controlled - Personally Reviewed   Cardiac Studies   Echo 08/25/17: Study Conclusions - Left ventricle: The cavity size was normal. Wall thickness was increased in a pattern of mild LVH. Systolic function was normal. The estimated ejection fraction was in the range of 50% to 55%. - Mitral valve: There was mild regurgitation. - Left atrium: The atrium was mildly dilated. - Pericardium, extracardiac: A trivial pericardial effusion was identified.  Patient Profile     83 y.o. female admitted with acute on chronic diastolic congestive heart  failure. Last echocardiogram November 2018 showed ejection fraction 50-55%, mild mitral regurgitation and mild left atrial enlargement.  Assessment & Plan    1. Acute on chronic diastolic heart failure - B/P-794; continue demadex 20 mg BID; check BMET one week; continue low Na diet and fluid restiction.   2. CKD stage III - check BMET one week following DC  3. Permanent Afib - continue cardizem, metoprolol and coumadin  4. HTN - BP is controlled; continue present meds  5. Palliative care consult appreciated. - Norco increased yesterday by palliative care; fu as outpt.  6. Abdominal pain - improved  DC today with TOC appt one week and then fu with Dr Rayann Heman 3 months >30 min PA and physician time D2  Signed, Kirk Ruths , MD 8:25 AM 09/17/2017 Pager: [phone]

## 2017-09-17 NOTE — Discharge Summary (Signed)
Discharge Summary    Patient ID: Angel Meyer,  MRN: [id number], DOB/AGE: 1933/02/14 83 y.o.  Admit date: 09/12/2017 Discharge date: 09/17/2017  Primary Care Provider: [name] Cardiologist: Dr. Rayann Heman   Discharge Diagnoses    Principal Problem:   Acute diastolic heart failure [facility]) Active Problems:   Hypertension   Restless leg syndrome   Atrial fibrillation (HCC)   Anticoagulant long-term use   Goals of care, counseling/discussion   Rib pain   Palliative care encounter   CKD (chronic kidney disease)   Allergies Allergies  Allergen Reactions  . Ciprofloxacin Nausea And Vomiting  . Demerol [Meperidine] Nausea And Vomiting  . Sulfa Antibiotics Nausea And Vomiting and Other (See Comments)    And all derivatives -  GI Bleeding (?)  . Sulfacetamide Sodium Nausea And Vomiting and Other (See Comments)    GI bleeding, also (?)     History of Present Illness     83 y.o.femalewith past medical history of diastolic CHF with EF 09-47%, permanant afib on coumadin, multiple cardioversion's, restless leg, OSA, HTN, HLD, mild dementia admitted from the office on 12/17/2018with acute on chronic CHF.  She was seen in the office by Truitt Merle NP on 09/12/17 for worsening SOB, swelling and abdominal pain. She was sent to [facility] for admission.   Hospital Course     Consultants: none  1. Acute on chronic diastolic heart failure: - Net neg 5.4L. Weight down 12 lbs (195--> 183lbs). She was transitioned to demadex 20 mg BID and will continue this at DC. Check BMET one week; continue low Na diet and fluid restiction. I have asked the facility to obtain and record daily weights.   2. CKD stage III: creat 1.64 today. Check BMET one week following DC  3. Permanent Afib: rate well controlled on cardizem, metoprolol. Continue coumadin  4. HTN: well controlled. Continue current medications  5. Abdominal pain: improved with diruesis.   6. Palliative care  consult appreciated. Norco increased yesterday by palliative care; fu as outpt.  Recommendations/Plan: (as outlined by palliative care)  Recommend heat pack prn and increase activity, ROM, movement as well.   Gabapentin 100mg  PO BID  Continue Acetaminophen 500mg  PO BID  Norco 10-325mg  PO TID - she had good response with this dose and tolerated well, continue increased scheduled dose (she takes these at 8 am, 2pm and 8pm)  May have Norco 7.5-325mg  PO 2000-0800 q4h prn (per regular home dose schedule)  Daily Miralax and Colace, LBM 12/20   The patient has had an uncomplicated hospital course and is recovering well. She has been seen by Dr. Stanford Breed today and deemed ready for discharge home. A staff message for all follow-up appointments has been sent. Discharge medications are listed below.  _____________  Discharge Vitals Blood pressure 123/70, pulse (!) 54, temperature 97.8 F (36.6 C), temperature source Oral, resp. rate 20, height 5\' 1"  (1.549 m), weight 183 lb 11.2 oz (83.3 kg), SpO2 95 %.  Filed Weights   09/15/17 0411 09/16/17 0453 09/17/17 0526  Weight: 186 lb 12.8 oz (84.7 kg) 184 lb 9.6 oz (83.7 kg) 183 lb 11.2 oz (83.3 kg)    Labs & Radiologic Studies     CBC Recent Labs    09/16/17 0622 09/17/17 0635  WBC 7.4 7.0  HGB 14.5 14.6  HCT 42.9 43.0  MCV 97.3 97.1  PLT 239 096   Basic Metabolic Panel Recent Labs    09/16/17 0742 09/17/17 0635  NA 136  136  K 3.5 3.5  CL 100* 100*  CO2 25 24  GLUCOSE 97 92  BUN 25* 28*  CREATININE 1.44* 1.64*  CALCIUM 9.6 9.4   Liver Function Tests No results for input(s): AST, ALT, ALKPHOS, BILITOT, PROT, ALBUMIN in the last 72 hours. No results for input(s): LIPASE, AMYLASE in the last 72 hours. Cardiac Enzymes No results for input(s): CKTOTAL, CKMB, CKMBINDEX, TROPONINI in the last 72 hours. BNP Invalid input(s): POCBNP D-Dimer No results for input(s): DDIMER in the last 72 hours. Hemoglobin A1C No results for  input(s): HGBA1C in the last 72 hours. Fasting Lipid Panel No results for input(s): CHOL, HDL, LDLCALC, TRIG, CHOLHDL, LDLDIRECT in the last 72 hours. Thyroid Function Tests No results for input(s): TSH, T4TOTAL, T3FREE, THYROIDAB in the last 72 hours.  Invalid input(s): FREET3  Ct Abdomen Pelvis Wo Contrast  Result Date: 09/12/2017 CLINICAL DATA:  Abdominal pain.  Nausea and vomiting. EXAM: CT ABDOMEN AND PELVIS WITHOUT CONTRAST TECHNIQUE: Multidetector CT imaging of the abdomen and pelvis was performed following the standard protocol without IV contrast. COMPARISON:  CT 08/23/2017 FINDINGS: Lower chest: Round atelectasis at the left lung base with adjacent pleural thickening, unchanged from prior exam. Coronary artery calcifications. Hepatobiliary: No focal hepatic lesion. Gallstones within physiologically distended gallbladder. No pericholecystic inflammation. No biliary dilatation. Pancreas: No ductal dilatation or inflammation. Spleen: Normal in size.  Calcified granulomas. Adrenals/Urinary Tract: No adrenal nodule. No hydronephrosis or urolithiasis.  No perinephric edema. Urinary bladder is nondistended. Stomach/Bowel: Stomach is physiologically distended. No small bowel inflammation, wall thickening or obstruction. Right inguinal hernia contains nonobstructed noninflamed distal ileum. Probable enteric sutures at the base of the cecum, suspect prior appendectomy. Appendix not visualized. Multifocal colonic diverticulosis, prominent in the sigmoid colon. No diverticulitis. No colonic inflammation or wall thickening. Vascular/Lymphatic: Advanced aortic atherosclerosis. No aneurysm. No enlarged abdominal or pelvic lymph nodes. Reproductive: Status post hysterectomy. No adnexal masses. Other: No free air, free fluid, or intra-abdominal fluid collection. Right inguinal hernia contains noninflamed nonobstructed small bowel. Musculoskeletal: Chronic postsurgical and degenerative change with multiple  compression fractures in the lumbar spine. No acute osseous abnormality. Bilateral hip degenerative change, right greater than left. IMPRESSION: 1. No acute abnormality. 2. Cholelithiasis without CT findings of gallbladder inflammation. 3. Right inguinal hernia contains nonobstructed noninflamed distal ileum. 4. Highly extensive colonic diverticulosis without acute inflammation. 5.  Aortic Atherosclerosis (ICD10-I70.0). Electronically Signed   By: Jeb Levering M.D.   On: 09/12/2017 21:06   Dg Chest 2 View  Result Date: 09/13/2017 CLINICAL DATA:  Pain. EXAM: CHEST  2 VIEW COMPARISON:  09/12/2017 FINDINGS: The cardio pericardial silhouette is enlarged. Stable appearance chronic atelectasis or scarring at the left base. Interstitial markings are diffusely coarsened with chronic features. Pleural effusion and opacity over the lower spine on the lateral film is stable to prior. Lower lumbar compression deformity again noted. Bones are diffusely demineralized. Patient is status post right shoulder replacement. Telemetry leads overlie the chest. IMPRESSION: No substantial change in exam. Cardiomegaly with retrocardiac collapse/ consolidation and small left pleural effusion. Electronically Signed   By: Misty Stanley M.D.   On: 09/13/2017 09:44   Dg Chest 2 View  Result Date: 09/12/2017 CLINICAL DATA:  Rib pain, shortness of breath. EXAM: CHEST  2 VIEW COMPARISON:  Radiograph of August 23, 2017. FINDINGS: Stable cardiomegaly. No pneumothorax or significant pleural effusion is noted. Status post right shoulder arthroplasty. Right lung is clear. Stable left basilar curvilinear densities are noted most consistent with scarring, subsegmental atelectasis  or less likely inflammation. IMPRESSION: Stable left basilar densities are noted most consistent with scarring, subsegmental atelectasis or less likely inflammation. Electronically Signed   By: Marijo Conception, M.D.   On: 09/12/2017 14:02   Dg Chest 2  View  Result Date: 08/22/2017 CLINICAL DATA:  Dyspnea EXAM: CHEST  2 VIEW COMPARISON:  08/13/2017 FINDINGS: Chronic lung disease with prominent lung markings. Left lower lobe airspace disease unchanged. Negative for heart failure or pneumonia. No significant pleural effusion. Mild cardiac enlargement Right shoulder replacement.  Lumbar fusion hardware. IMPRESSION: Chronic lung disease with scarring and left lower lobe atelectasis, stable. No interval change. Electronically Signed   By: Franchot Gallo M.D.   On: 08/22/2017 15:06   Nm Hepatobiliary Liver Func  Result Date: 08/24/2017 CLINICAL DATA:  Severe left upper quadrant pain.  Cholelithiasis. EXAM: NUCLEAR MEDICINE HEPATOBILIARY IMAGING TECHNIQUE: Sequential images of the abdomen were obtained out to 60 minutes following intravenous administration of radiopharmaceutical. RADIOPHARMACEUTICALS:  5.3 mCi Tc-73m  Choletec IV COMPARISON:  None. FINDINGS: Prompt uptake and biliary excretion of activity by the liver is seen. Gallbladder activity is visualized, consistent with patency of cystic duct. Biliary activity passes into small bowel, consistent with patent common bile duct. IMPRESSION: Normal hepatobiliary imaging demonstrating patency of both cystic and common bile ducts. Electronically Signed   By: Earle Gell M.D.   On: 08/24/2017 14:43   Ct Abdomen Pelvis W Contrast  Result Date: 08/23/2017 CLINICAL DATA:  Left-sided abdominal pain and bilateral lower extremity swelling. EXAM: CT ABDOMEN AND PELVIS WITH CONTRAST TECHNIQUE: Multidetector CT imaging of the abdomen and pelvis was performed using the standard protocol following bolus administration of intravenous contrast. CONTRAST:  <See Chart> ISOVUE-300 IOPAMIDOL (ISOVUE-300) INJECTION 61% COMPARISON:  None. FINDINGS: Lower chest: Stable area of rounded atelectasis at the left lung base when compared to prior chest CT scan from 11/17 218. The heart is mildly enlarged and coronary artery  calcifications are noted. Hepatobiliary: No focal hepatic lesions or intrahepatic biliary dilatation. Gallbladder is mildly distended. A small calcified gallstone is noted. No CT findings to suggest acute cholecystitis. Pancreas: No mass, inflammation or ductal dilatation. Spleen: Normal size.  Small calcified granulomas. Adrenals/Urinary Tract: The adrenal glands are unremarkable. No renal lesions or hydronephrosis. The delayed images do not demonstrate any significant collecting system abnormality cyst. No worrisome renal lesions. Stomach/Bowel: The stomach, duodenum, small bowel and colon are grossly normal. No acute inflammatory changes, mass lesions or obstructive findings. Diffuse colonic diverticulosis without definite findings for acute diverticulitis. The terminal ileum is normal. Vascular/Lymphatic: Advanced atherosclerotic calcifications involving aorta and iliac arteries. No aneurysm or dissection. The branch vessels are patent. The major venous structures are patent. The circumaortic left renal vein is noted. No mesenteric or retroperitoneal mass or adenopathy. Reproductive: Surgically absent. Other: There is a right inguinal hernia containing the cecum just distal to the ileocecal valve. No findings for obstruction or incarceration but could be a source of pain. Musculoskeletal: No significant bony findings. There are vertebral augmentation changes noted at L3 and there is spinal stabilization hardware at L1-2. Remote appearing L1 fracture. IMPRESSION: 1. Cholelithiasis without CT findings for acute cholecystitis. 2. Right inguinal hernia containing the cecum. No obvious obstruction/incarceration. 3. Diffuse colonic diverticulosis without findings for acute diverticulitis. 4. Advanced vascular disease. Electronically Signed   By: Marijo Sanes M.D.   On: 08/23/2017 16:06   Dg Chest Port 1 View  Result Date: 08/23/2017 CLINICAL DATA:  Bibasilar crackers. Exertional dyspnea. Chronic shortness of  breath.  EXAM: PORTABLE CHEST 1 VIEW COMPARISON:  08/22/2017 FINDINGS: The cardiac silhouette is enlarged. Mediastinal contours appear intact. There is no evidence of focal airspace consolidation, pleural effusion or pneumothorax. Chronic linear peribronchial densities in the left lung base. Osseous structures are without acute abnormality. Soft tissues are grossly normal. IMPRESSION: Persistent linear peribronchial airspace opacities in the left lung base. This may represent atelectasis, scarring or peribronchial airspace consolidation. Electronically Signed   By: Fidela Salisbury M.D.   On: 08/23/2017 13:31   US Abdomen Limited Ruq  Result Date: 08/23/2017 CLINICAL DATA:  83 year old with right upper quadrant pain. EXAM: ULTRASOUND ABDOMEN LIMITED RIGHT UPPER QUADRANT COMPARISON:  08/23/2017 FINDINGS: Gallbladder: Mild distention of the gallbladder. Gallbladder wall measures up to 0.2 cm. Echogenic foci in the gallbladder are suggestive for stones. Reportedly, the patient does have a sonographic Murphy sign. No evidence for pericholecystic fluid. Common bile duct: Diameter: 0.3 cm. Liver: Liver is not well visualized on this examination. Liver is mildly heterogeneous. No significant biliary dilatation. Portal vein is patent on color Doppler imaging with normal direction of blood flow towards the liver. IMPRESSION: Cholelithiasis with mild gallbladder distension and a Murphy sign. Findings are concerning for acute cholecystitis. No biliary dilatation. Electronically Signed   By: Markus Daft M.D.   On: 08/23/2017 17:50     Diagnostic Studies/Procedures    None.  _____________    Disposition   Pt is being discharged home today in good condition.  Follow-up Plans & Appointments    Follow-up Information    Burtis Junes, NP Follow up.   Specialties:  Nurse Practitioner, Interventional Cardiology, Cardiology, Radiology Why:  The office will call you to arrange an appointment and labs with  [facility] or another PA/NP in 1 week.  Contact information: [place] [place] [address] [phone]          Discharge Instructions    Diet - low sodium heart healthy   Complete by:  As directed    Discharge instructions   Complete by:  As directed    Please obtain and record daily weight   Increase activity slowly   Complete by:  As directed       Discharge Medications     Medication List    TAKE these medications   acetaminophen 500 MG tablet Commonly known as:  TYLENOL Take 500 mg by mouth 2 (two) times daily.   diazepam 2 MG tablet Commonly known as:  VALIUM Take 1 tablet (2 mg total) by mouth at bedtime as needed for muscle spasms.   diltiazem 180 MG 24 hr capsule Commonly known as:  CARDIZEM CD Take 1 capsule (180 mg total) by mouth daily.   diltiazem 30 MG tablet Commonly known as:  CARDIZEM Take 30 mg by mouth every 6 (six) hours as needed (A-FIB - hold and call MD Pulse >120).   donepezil 10 MG disintegrating tablet Commonly known as:  ARICEPT ODT Take 1 tablet (10 mg total) by mouth at bedtime.   escitalopram 10 MG tablet Commonly known as:  LEXAPRO TAKE 1 TABLET EVERY DAY What changed:    how much to take  how to take this  when to take this   gabapentin 100 MG capsule Commonly known as:  NEURONTIN Take 1 capsule (100 mg total) by mouth 2 (two) times daily. What changed:  when to take this   HYDROcodone-acetaminophen 7.5-325 MG tablet Commonly known as:  NORCO Take 1 tablet by mouth See admin instructions.  1 tablet three times a day and may take an additional 2 doses as needed for nighttime pain between 2000 and 0800 What changed:    when to take this  additional instructions   HYDROcodone-acetaminophen 10-325 MG tablet Commonly known as:  NORCO Take 1 tablet by mouth 3 (three) times daily. What changed:  You were already taking a medication with the same name, and this prescription was added. Make sure you  understand how and when to take each.   Magnesium 500 MG Tabs Take 500 mg by mouth at bedtime.   Melatonin 5 MG Tabs Take 5 mg at bedtime by mouth.   memantine 10 MG tablet Commonly known as:  NAMENDA Take 10 mg 2 (two) times daily by mouth.   metoprolol succinate 25 MG 24 hr tablet Commonly known as:  TOPROL-XL Take 1 tablet (25 mg total) by mouth daily.   nitroGLYCERIN 0.4 MG SL tablet Commonly known as:  NITROSTAT Place 1 tablet (0.4 mg total) under the tongue every 5 (five) minutes as needed for chest pain. What changed:  when to take this   oxybutynin 10 MG 24 hr tablet Commonly known as:  DITROPAN-XL Take 10 mg by mouth at bedtime.   polyethylene glycol powder powder Commonly known as:  GLYCOLAX/MIRALAX Take 17 g by mouth daily. Take as needed to produce 1 normal bowel movement per day.   PRESERVISION AREDS Caps Take 1 capsule by mouth 2 (two) times daily.   Roller [employer] Used as Directed.   rOPINIRole 1 MG tablet Commonly known as:  REQUIP Take 1 tablet (1 mg total) by mouth See admin instructions. Take 1 tablet (1 mg) by mouth daily at 4pm and at bedtime What changed:    when to take this  additional instructions   sennosides-docusate sodium 8.6-50 MG tablet Commonly known as:  SENOKOT-S Take 1 tablet by mouth daily.   spironolactone 25 MG tablet Commonly known as:  ALDACTONE Take 1 tablet (25 mg total) by mouth daily.   torsemide 20 MG tablet Commonly known as:  DEMADEX Take 1 tablet (20 mg total) by mouth 2 (two) times daily. Take an additional 20 mg for swelling or weight increase >3 lbs What changed:    how much to take  how to take this  when to take this  additional instructions   triamcinolone cream 0.1 % Commonly known as:  KENALOG Apply 1 application topically See admin instructions. 1 application applied topically to both breasts two times a day as needed for irritation   trimethoprim 100 MG tablet Commonly known as:   TRIMPEX Take 100 mg by mouth daily.   trolamine salicylate 10 % cream Commonly known as:  ASPERCREME Apply 1 application every 12 (twelve) hours as needed topically for muscle pain.   warfarin 2.5 MG tablet Commonly known as:  COUMADIN Take as directed. If you are unsure how to take this medication, talk to your nurse or doctor. Original instructions:  Take as directed by anticoagulation clinic. What changed:    how much to take  how to take this  when to take this  additional instructions         Outstanding Labs/Studies  BMET   Duration of Discharge Encounter   Greater than 30 minutes including physician time.  Signed, Angelena Form PA-C 09/17/2017, 12:38 PM

## 2017-09-17 NOTE — Discharge Instructions (Signed)
PLEASE OBTAIN AND RECORD DAILY WEIGHTS

## 2017-09-21 ENCOUNTER — Telehealth: Payer: Self-pay | Admitting: Internal Medicine

## 2017-09-21 ENCOUNTER — Other Ambulatory Visit: Payer: PRIVATE HEALTH INSURANCE

## 2017-09-21 NOTE — Telephone Encounter (Signed)
New message     TOC 09/23/17 Truitt Merle  2pm

## 2017-09-22 ENCOUNTER — Other Ambulatory Visit: Payer: Self-pay | Admitting: Family Medicine

## 2017-09-22 DIAGNOSIS — R2681 Unsteadiness on feet: Secondary | ICD-10-CM | POA: Diagnosis not present

## 2017-09-22 DIAGNOSIS — Z9181 History of falling: Secondary | ICD-10-CM | POA: Diagnosis not present

## 2017-09-22 DIAGNOSIS — M545 Low back pain: Secondary | ICD-10-CM | POA: Diagnosis not present

## 2017-09-22 DIAGNOSIS — R262 Difficulty in walking, not elsewhere classified: Secondary | ICD-10-CM | POA: Diagnosis not present

## 2017-09-22 DIAGNOSIS — R531 Weakness: Secondary | ICD-10-CM | POA: Diagnosis not present

## 2017-09-22 NOTE — Telephone Encounter (Signed)
Refill for 6 months. 

## 2017-09-22 NOTE — Telephone Encounter (Signed)
Looks like Rx given by ER physician 11/27-is this OK to refill?

## 2017-09-22 NOTE — Telephone Encounter (Signed)
1st TCM call:   TCM call made with no answer on phone, message left for a call back. A reminder of her appointment tomorrow at 2pm left on machine.

## 2017-09-23 ENCOUNTER — Ambulatory Visit (INDEPENDENT_AMBULATORY_CARE_PROVIDER_SITE_OTHER): Payer: Medicare Other | Admitting: Nurse Practitioner

## 2017-09-23 ENCOUNTER — Encounter: Payer: Self-pay | Admitting: Nurse Practitioner

## 2017-09-23 VITALS — BP 90/60 | HR 76 | Ht 60.0 in | Wt 190.1 lb

## 2017-09-23 DIAGNOSIS — R109 Unspecified abdominal pain: Secondary | ICD-10-CM

## 2017-09-23 DIAGNOSIS — I5032 Chronic diastolic (congestive) heart failure: Secondary | ICD-10-CM | POA: Diagnosis not present

## 2017-09-23 DIAGNOSIS — R2681 Unsteadiness on feet: Secondary | ICD-10-CM | POA: Diagnosis not present

## 2017-09-23 DIAGNOSIS — I481 Persistent atrial fibrillation: Secondary | ICD-10-CM | POA: Diagnosis not present

## 2017-09-23 DIAGNOSIS — Z9181 History of falling: Secondary | ICD-10-CM | POA: Diagnosis not present

## 2017-09-23 DIAGNOSIS — M545 Low back pain: Secondary | ICD-10-CM | POA: Diagnosis not present

## 2017-09-23 DIAGNOSIS — R262 Difficulty in walking, not elsewhere classified: Secondary | ICD-10-CM | POA: Diagnosis not present

## 2017-09-23 DIAGNOSIS — I10 Essential (primary) hypertension: Secondary | ICD-10-CM | POA: Diagnosis not present

## 2017-09-23 DIAGNOSIS — I4819 Other persistent atrial fibrillation: Secondary | ICD-10-CM

## 2017-09-23 DIAGNOSIS — Z7901 Long term (current) use of anticoagulants: Secondary | ICD-10-CM | POA: Diagnosis not present

## 2017-09-23 MED ORDER — TORSEMIDE 20 MG PO TABS: 40.0000 mg | ORAL_TABLET | Freq: Every day | ORAL | 3 refills | Status: DC

## 2017-09-23 NOTE — Progress Notes (Signed)
CARDIOLOGY OFFICE NOTE  Date:  09/23/2017    Angel Meyer Date of Birth: 08-07-1933 Medical Record #[id number]  PCP:  [name], MD  Cardiologist:  Vincent    Chief Complaint  Patient presents with  . Congestive Heart Failure    Post hospital visit - seen for Dr. Rayann Heman    History of Present Illness: Angel Meyer is a 83 y.o. female who presents today for a post hospital visit. Seen for Dr. Rayann Heman.   She has a history of rheumatic fever at age 73, HTN, HLD, OA, incontinence, RLS, cognitive impairmentand OSA. She has hada lung nodule thatwasfollowed by pulmonary which resolved. She failed on Flecainide and Tikosyn and was onlow doseamiodarone. Other issues include diastolic CHF with EF 76-54%, now with permanant afib on coumadin, prior multiple cardioversion's, restless leg, & OSA.   I saw her earlier this month - having acute CHF/abdominal/rib pain - subsequently admitted from the office. She was diuresed down12 pounds. Rib pain noted to have resolved. Seen by palliative care as well.   Comes in today. Here with her granddaughter [name] today. I have spoken with her daughter by [facility]. Weight is already going back up. Still with right sided rib pain - she tells me that this really never improved. Some swelling. Sitting with her legs down. No chest pain. Getting evening dose of diuretic at 8pm. Seeing Dr. Nelva Bush on Tuesday. Does not have follow up with PCP yet.   Past Medical History:  Diagnosis Date  . Arthritis   . CKD (chronic kidney disease)   . Diastolic dysfunction    preserved EF,  CHF In setting of afib previously  . History of blood transfusion   . Hyperlipidemia   . Hypertension   . Incontinence of urine   . OSA (obstructive sleep apnea)    does not use CPAP  . Osteoporosis   . Persistent atrial fibrillation (HCC)    failed on Flecainide, Tikosyn, and amiodarone  . Renal calculi   . Restless legs   . Rheumatic fever    age 28  .  Systolic heart failure (HCC)    EF 30 to 35% per echo 08/2013,  subsequently has normalized    Past Surgical History:  Procedure Laterality Date  . ABDOMINAL HYSTERECTOMY    . BREAST SURGERY     benign biopsy  . CARDIOVERSION N/A 09/10/2013   Procedure: CARDIOVERSION;  Surgeon: Thompson Grayer, MD;  Location: [facility];  Service: Cardiovascular;  Laterality: N/A;  . CARDIOVERSION N/A 11/16/2013   Procedure: CARDIOVERSION;  Surgeon: Sueanne Margarita, MD;  Location[facility] ENDOSCOPY;  Service: Cardiovascular;  Laterality: N/A;  . CARDIOVERSION N/A 08/18/2015   Procedure: CARDIOVERSION;  Surgeon: Sueanne Margarita, MD;  Location: [facility] ENDOSCOPY;  Service: Cardiovascular;  Laterality: N/A;  . CARDIOVERSION N/A 03/07/2017   Procedure: CARDIOVERSION;  Surgeon: Dorothy Spark, MD;  Location: [facility] ENDOSCOPY;  Service: Cardiovascular;  Laterality: N/A;  . CARDIOVERSION N/A 05/13/2017   Procedure: CARDIOVERSION;  Surgeon: Lelon Perla, MD;  Location: [facility] ENDOSCOPY;  Service: Cardiovascular;  Laterality: N/A;  . IR RADIOLOGIST EVAL & MGMT  02/04/2017  . IR VERTEBROPLASTY LUMBAR BX INC UNI/BIL INC/INJECT/IMAGING  02/10/2017  . [place] removed  . REPLACEMENT TOTAL KNEE  2005  . SPINE SURGERY     laminectomy 1965, spinal fusion with rod 2005  . TEE WITHOUT CARDIOVERSION N/A 03/07/2017   Procedure: TRANSESOPHAGEAL ECHOCARDIOGRAM (TEE);  Surgeon:  Dorothy Spark, MD;  Location: [facility] ENDOSCOPY;  Service: Cardiovascular;  Laterality: N/A;  . TEE WITHOUT CARDIOVERSION N/A 05/13/2017   Procedure: TRANSESOPHAGEAL ECHOCARDIOGRAM (TEE);  Surgeon: Lelon Perla, MD;  Location: [facility] ENDOSCOPY;  Service: Cardiovascular;  Laterality: N/A;  . TONSILLECTOMY       Medications: Current Meds  Medication Sig  . acetaminophen (TYLENOL) 500 MG tablet Take 500 mg by mouth 2 (two) times daily.  . diazepam (VALIUM) 2 MG tablet Take 1 tablet (2 mg total) by mouth at bedtime as needed for muscle spasms.  Marland Kitchen  diltiazem (CARDIZEM CD) 180 MG 24 hr capsule Take 1 capsule (180 mg total) by mouth daily.  Marland Kitchen diltiazem (CARDIZEM) 30 MG tablet Take 30 mg by mouth every 6 (six) hours as needed (A-FIB - hold and call MD Pulse >120).  . donepezil (ARICEPT ODT) 10 MG disintegrating tablet Take 1 tablet (10 mg total) by mouth at bedtime.  Marland Kitchen escitalopram (LEXAPRO) 10 MG tablet TAKE 1 TABLET EVERY DAY (Patient taking differently: Take 10 mg by mouth once daily)  . gabapentin (NEURONTIN) 100 MG capsule Take 1 capsule (100 mg total) by mouth 2 (two) times daily.  Marland Kitchen HYDROcodone-acetaminophen (NORCO) 10-325 MG tablet Take 1 tablet by mouth 3 (three) times daily.  Marland Kitchen HYDROcodone-acetaminophen (NORCO) 7.5-325 MG tablet Take 1 tablet by mouth See admin instructions. 1 tablet three times a day and may take an additional 2 doses as needed for nighttime pain between 2000 and 0800 (Patient taking differently: Take 1 tablet by mouth 3 (three) times daily. May take additional two doses as needed for nighttime pain between 2000-0800)  . Magnesium 500 MG TABS Take 500 mg by mouth at bedtime.  . Melatonin 5 MG TABS Take 5 mg at bedtime by mouth.   . memantine (NAMENDA) 10 MG tablet Take 10 mg 2 (two) times daily by mouth.  . metoprolol succinate (TOPROL-XL) 25 MG 24 hr tablet TAKE 1 TABLET BY MOUTH ONCE A DAY.  . Misc. Devices (ROLLER [place]) MISC Used as Directed.  . Multiple Vitamins-Minerals (PRESERVISION AREDS) CAPS Take 1 capsule by mouth 2 (two) times daily.  . nitroGLYCERIN (NITROSTAT) 0.4 MG SL tablet Place 1 tablet (0.4 mg total) under the tongue every 5 (five) minutes as needed for chest pain. (Patient taking differently: Place 0.4 mg under the tongue every 5 (five) minutes x 3 doses as needed for chest pain. )  . oxybutynin (DITROPAN-XL) 10 MG 24 hr tablet Take 10 mg by mouth at bedtime.   . polyethylene glycol powder (GLYCOLAX/MIRALAX) powder Take 17 g by mouth daily. Take as needed to produce 1 normal bowel movement per day.    Marland Kitchen rOPINIRole (REQUIP) 1 MG tablet Take 1 tablet (1 mg total) by mouth See admin instructions. Take 1 tablet (1 mg) by mouth daily at 4pm and at bedtime (Patient taking differently: Take 1 mg by mouth 2 (two) times daily. )  . sennosides-docusate sodium (SENOKOT-S) 8.6-50 MG tablet Take 1 tablet by mouth daily.  Marland Kitchen spironolactone (ALDACTONE) 25 MG tablet Take 1 tablet (25 mg total) by mouth daily.  Marland Kitchen triamcinolone cream (KENALOG) 0.1 % Apply 1 application topically See admin instructions. 1 application applied topically to both breasts two times a day as needed for irritation  . trimethoprim (TRIMPEX) 100 MG tablet Take 100 mg by mouth daily.   Marland Kitchen trolamine salicylate (ASPERCREME) 10 % cream Apply 1 application every 12 (twelve) hours as needed topically for muscle pain.  Marland Kitchen warfarin (COUMADIN) 2.5  MG tablet Take as directed by anticoagulation clinic. (Patient taking differently: Take 2.5 mg by mouth every evening. )  . [DISCONTINUED] torsemide (DEMADEX) 20 MG tablet Take 1 tablet (20 mg total) by mouth 2 (two) times daily. Take an additional 20 mg for swelling or weight increase >3 lbs     Allergies: Allergies  Allergen Reactions  . Ciprofloxacin Nausea And Vomiting  . Demerol [Meperidine] Nausea And Vomiting  . Sulfa Antibiotics Nausea And Vomiting and Other (See Comments)    And all derivatives -  GI Bleeding (?)  . Sulfacetamide Sodium Nausea And Vomiting and Other (See Comments)    GI bleeding, also (?)    Social History: The patient  reports that  has never smoked. she has never used smokeless tobacco. She reports that she does not drink alcohol or use drugs.   Family History: The patient's family history includes Cancer in her mother and paternal grandfather; Diabetes in her mother.   Review of Systems: Please see the history of present illness.   Otherwise, the review of systems is positive for none.   All other systems are reviewed and negative.   Physical Exam: VS:  BP 90/60  (BP Location: Left Arm, Patient Position: Sitting, Cuff Size: Large)   Pulse 76   Ht 5' (1.524 m)   Wt 190 lb 1.9 oz (86.2 kg)   SpO2 96% Comment: at rest  BMI 37.13 kg/m  .  BMI Body mass index is 37.13 kg/m.  Wt Readings from Last 3 Encounters:  09/23/17 190 lb 1.9 oz (86.2 kg)  09/17/17 183 lb 11.2 oz (83.3 kg)  09/12/17 195 lb 12.8 oz (88.8 kg)    General: Elderly. Chronically ill. Alert and in no acute distress. She is in a wheelchair today. Does not seem to be in as much distress as she was last week. Weight already climbing back up.   HEENT: Normal.  Neck: Supple, no JVD, carotid bruits, or masses noted.  Cardiac: Irregular irregular rhythm. Rate is ok. No murmurs, rubs, or gallops. Legs are full with 1+edema.  Respiratory:  Lungs are clear to auscultation bilaterally with normal work of breathing.  GI: Soft. She has definite tenderness over the right upper quadrant - she flinches every time I touch her.  MS: No deformity or atrophy. Gait not tested/  Skin: Warm and dry. Color is normal.  Neuro:  Strength and sensation are intact and no gross focal deficits noted.  Psych: Alert, appropriate and with normal affect.   LABORATORY DATA:  EKG:  EKG is not ordered today.  Lab Results  Component Value Date   WBC 7.0 09/17/2017   HGB 14.6 09/17/2017   HCT 43.0 09/17/2017   PLT 201 09/17/2017   GLUCOSE 92 09/17/2017   CHOL 215 (H) 12/07/2016   TRIG 130 12/07/2016   HDL 78 12/07/2016   LDLCALC 111 (H) 12/07/2016   ALT 18 09/12/2017   AST 29 09/12/2017   NA 136 09/17/2017   K 3.5 09/17/2017   CL 100 (L) 09/17/2017   CREATININE 1.64 (H) 09/17/2017   BUN 28 (H) 09/17/2017   CO2 24 09/17/2017   TSH 2.902 09/02/2017   INR 2.36 09/17/2017     BNP (last 3 results) Recent Labs    09/07/17 1503 09/12/17 1253 09/12/17 1623  BNP 235.8* 241.0* 232.0*    ProBNP (last 3 results) No results for input(s): PROBNP in the last 8760 hours.   Other Studies Reviewed  Today:  Echo Study Conclusions  November 2018  - Left ventricle: The cavity size was normal. Wall thickness was   increased in a pattern of mild LVH. Systolic function was normal.   The estimated ejection fraction was in the range of 50% to 55%. - Mitral valve: There was mild regurgitation. - Left atrium: The atrium was mildly dilated. - Pericardium, extracardiac: A trivial pericardial effusion was   identified.   Assessment/Plan:  1. Acute on chronic diastolic heart failure: Weight is already going back up - changing Demadex to 40 mg in the AM and 20 mg in early afternoon. Lab today.   2. Right sided rib pain - seeing Dr. Nelva Bush on Tuesday - ?needs bone scan. ?if this is really her gallbladder - will get her back to see Dr. Rush Farmer. I do not think this is related to her heart failure with the degree of point tenderness that she has on exam.   3. CKD - recheck lab today  4. Persistent AF - managed with rate control and anticoagulation.   5. HTN - BP ok on current regimen.  6. Dementia    -Current medicines are reviewed with the patient today.  The patient does not have concerns regarding medicines other than what has been noted above.  The following changes have been made:  See above.  Labs/ tests ordered today include:    Orders Placed This Encounter  Procedures  . Basic metabolic panel  . Pro b natriuretic peptide (BNP)  . Ambulatory referral to General Surgery     Disposition:   FU with me in about a month. Long discussion with the daughter. She is asking about Hospice. Would continue with palliative care. Overall prognosis is tenuous at best.   Patient is agreeable to this plan and will call if any problems develop in the interim.   SignedTruitt Merle, NP  09/23/2017 2:19 PM  [facility] [address] [place], [place]  [address] Phone: [phone] Fax: [phone]

## 2017-09-23 NOTE — Patient Instructions (Addendum)
We will be checking the following labs today - BMET and BNP   Medication Instructions:    Continue with your current medicines. BUT  I am increasing the Torsemide to 40 mg at 8AM and 20 mg at [facility] every day    Testing/Procedures To Be Arranged:  N/A  Follow-Up:   See me in about a month  Referral to Dr. Unknown Jim for gallbladder    Other Special Instructions:   N/A    If you need a refill on your cardiac medications before your next appointment, please call your pharmacy.   Call the [place] office at [phone] if you have any questions, problems or concerns.

## 2017-09-23 NOTE — Telephone Encounter (Signed)
Rx done. 

## 2017-09-26 ENCOUNTER — Telehealth: Payer: Self-pay | Admitting: *Deleted

## 2017-09-26 ENCOUNTER — Telehealth: Payer: Self-pay | Admitting: Family Medicine

## 2017-09-26 ENCOUNTER — Telehealth: Payer: Self-pay | Admitting: Internal Medicine

## 2017-09-26 DIAGNOSIS — R2681 Unsteadiness on feet: Secondary | ICD-10-CM | POA: Diagnosis not present

## 2017-09-26 DIAGNOSIS — R262 Difficulty in walking, not elsewhere classified: Secondary | ICD-10-CM | POA: Diagnosis not present

## 2017-09-26 DIAGNOSIS — Z9181 History of falling: Secondary | ICD-10-CM | POA: Diagnosis not present

## 2017-09-26 DIAGNOSIS — M545 Low back pain: Secondary | ICD-10-CM | POA: Diagnosis not present

## 2017-09-26 MED ORDER — TORSEMIDE 20 MG PO TABS: 40.0000 mg | ORAL_TABLET | Freq: Two times a day (BID) | ORAL | 3 refills | Status: DC

## 2017-09-26 MED ORDER — TORSEMIDE 20 MG PO TABS: 40.0000 mg | ORAL_TABLET | Freq: Two times a day (BID) | ORAL | Status: DC

## 2017-09-26 NOTE — Telephone Encounter (Signed)
Daughter calling in inquiring about pain medication that was called in for her mother.   According to the notes the Norco was faxed to the pharmacy in [place], [place] on Dec 27 and Dec 31st.    I gave her the number to the pharmacy so she could check on it prior to going to pick it up.

## 2017-09-26 NOTE — Telephone Encounter (Signed)
Follow up     Needs written ordered faxed to them asap to implement the increase in medication   Fax [phone] - Make sure order is signed by doctor

## 2017-09-26 NOTE — Telephone Encounter (Signed)
Copied from [place]. Topic: General - Other >> Sep 26, 2017 10:04 AM Neva Seat wrote: [facility] [phone] Faxed an order on Dec. 27 and refaxing it today.  Tylenol 500 Hydrocodone APAP 10/325 [facility] - [place], [place] [address] Phone: [phone] Fax: [phone]

## 2017-09-26 NOTE — Telephone Encounter (Signed)
Patient's daughter, Olin Hauser [facility]) is calling stating that patient has gained 10 lbs in one week. Patient weights 193 lbs today. Patient had an appointment with Truitt Merle NP on Friday and her weight was 190 lbs at that time. Daughter was requesting an increase in patient's diuretic. Patient's last recorded BP was 90/60 and HR 76. Asked daughter what patient's BP and HR are today. Patient's daughter stated she will call patient at the assisted living and have them take her vital signs. Patient's daughter will call back.  Patient recent labs showed BNP 2,089. BMET still pending.  Patient's BP 88/63 and HR 77. Swelling in BLE. According to scales at assisted living patient has gained 5.4 lbs. Will forward to Truitt Merle NP, who saw patient on Friday.

## 2017-09-26 NOTE — Telephone Encounter (Signed)
[facility] called to get a nurse to call pharmacy for verbal order on medication Demadex.  Cecille Rubin just signed script and Ivor Reining faxed to appropriate place.

## 2017-09-26 NOTE — Telephone Encounter (Signed)
Ok to increase the Demadex to 40 mg BID (8am and 2pm)  Waiting on the BMET still from Friday.   May need to add potassium - will have to see.

## 2017-09-26 NOTE — Telephone Encounter (Signed)
New message   Seen Angel Meyer last week   Pt c/o swelling: STAT is pt has developed SOB within 24 hours  1) How much weight have you gained and in what time span? 10 lbs 7 days   2) If swelling, where is the swelling located? Ankles , eyes are puffy, abdominal pain  3) Are you currently taking a fluid pill? Yes ,   4) Are you currently SOB? Not as bad as it usually is  5) Do you have a log of your daily weights (if so, list)? 183 last Saturday this Friday 186 Saturday 189, this morning 193  6) Have you gained 3 pounds in a day or 5 pounds in a week? yes  7) Have you traveled recently? No

## 2017-09-26 NOTE — Telephone Encounter (Signed)
Printed prescription and had Truitt Merle NP sign. Faxed to number provided.

## 2017-09-26 NOTE — Telephone Encounter (Signed)
Patient is requesting refill of Hydrocodone APAP 10/325 and Tylenol 500, last OV 07/2017 with PCP, no refills on medication.

## 2017-09-26 NOTE — Telephone Encounter (Signed)
Pt had hospital f/u with Truitt Merle, NP on 09/23/17.

## 2017-09-26 NOTE — Telephone Encounter (Signed)
Called [place] at [facility] as directed by patient's daughter Jeannene Patella. Left message for her to call back.

## 2017-09-26 NOTE — Telephone Encounter (Signed)
Cindy with [facility] called back. Informed her of Truitt Merle NP recommendation. She will needs orders faxed to them. Will fax note to number provided. [phone].

## 2017-09-26 NOTE — Telephone Encounter (Signed)
Follow Up:    Jenny Reichmann says she needs the order signed by the doctor please.

## 2017-09-28 DIAGNOSIS — M546 Pain in thoracic spine: Secondary | ICD-10-CM | POA: Diagnosis not present

## 2017-09-28 DIAGNOSIS — G894 Chronic pain syndrome: Secondary | ICD-10-CM | POA: Diagnosis not present

## 2017-09-28 DIAGNOSIS — R0781 Pleurodynia: Secondary | ICD-10-CM | POA: Diagnosis not present

## 2017-09-28 LAB — PRO B NATRIURETIC PEPTIDE: NT-Pro BNP: 2089 pg/mL — ABNORMAL HIGH (ref 0–738)

## 2017-09-28 LAB — BASIC METABOLIC PANEL

## 2017-09-28 NOTE — Telephone Encounter (Signed)
Would not recommend both plain Tylenol and the Vicodin (since Vicodin has Tylenol in it).

## 2017-09-28 NOTE — Telephone Encounter (Signed)
Rx for Hydrocodone faxed and confirmed. Okay to send Rx for Tylenol 500?  Directions please

## 2017-09-28 NOTE — Telephone Encounter (Signed)
Cindy from [facility] with fax a form to discontinue Tylenol 500 for patient.

## 2017-09-29 ENCOUNTER — Other Ambulatory Visit: Payer: Medicare Other

## 2017-09-29 ENCOUNTER — Telehealth: Payer: Self-pay | Admitting: Family Medicine

## 2017-09-29 ENCOUNTER — Other Ambulatory Visit: Payer: Medicare Other | Admitting: *Deleted

## 2017-09-29 ENCOUNTER — Other Ambulatory Visit: Payer: Self-pay

## 2017-09-29 DIAGNOSIS — I10 Essential (primary) hypertension: Secondary | ICD-10-CM

## 2017-09-29 NOTE — Telephone Encounter (Signed)
For review

## 2017-09-29 NOTE — Telephone Encounter (Signed)
Copied from [place] [id number]. Topic: Appointment Scheduling - Scheduling Inquiry for Clinic >> Sep 29, 2017 12:19 PM Oliver Pila B wrote: Reason for CRM: pt's daughter called b/c she has came from [place] to take care of pt(mom) and is being updated on pt status, apparently pt was released from hospital on the 27th of Dec but is trying to make a hos f/u now, pt's child states the pt is having difficulty w/ pain when breathing, contact pt to advise on whether pt can be fit in earlier but pt currently has an appt on 1.9.19 11am  Rescheduled office visit for 10/04/2017 here, pt was seen on 09/23/18 at Cardiology.

## 2017-09-30 ENCOUNTER — Other Ambulatory Visit: Payer: Self-pay | Admitting: *Deleted

## 2017-09-30 DIAGNOSIS — Z9181 History of falling: Secondary | ICD-10-CM | POA: Diagnosis not present

## 2017-09-30 DIAGNOSIS — R262 Difficulty in walking, not elsewhere classified: Secondary | ICD-10-CM | POA: Diagnosis not present

## 2017-09-30 DIAGNOSIS — M545 Low back pain: Secondary | ICD-10-CM | POA: Diagnosis not present

## 2017-09-30 DIAGNOSIS — I5031 Acute diastolic (congestive) heart failure: Secondary | ICD-10-CM

## 2017-09-30 DIAGNOSIS — R2681 Unsteadiness on feet: Secondary | ICD-10-CM | POA: Diagnosis not present

## 2017-09-30 LAB — BASIC METABOLIC PANEL
BUN/Creatinine Ratio: 19 (ref 12–28)
BUN: 27 mg/dL (ref 8–27)
CO2: 23 mmol/L (ref 20–29)
Calcium: 9.6 mg/dL (ref 8.7–10.3)
Chloride: 96 mmol/L (ref 96–106)
Creatinine, Ser: 1.4 mg/dL — ABNORMAL HIGH (ref 0.57–1.00)
GFR calc Af Amer: 40 mL/min/{1.73_m2} — ABNORMAL LOW (ref >59)
GFR calc non Af Amer: 35 mL/min/{1.73_m2} — ABNORMAL LOW (ref >59)
Glucose: 92 mg/dL (ref 65–99)
Potassium: 4.1 mmol/L (ref 3.5–5.2)
Sodium: 141 mmol/L (ref 134–144)

## 2017-10-04 ENCOUNTER — Telehealth: Payer: Self-pay | Admitting: Family Medicine

## 2017-10-04 ENCOUNTER — Ambulatory Visit (INDEPENDENT_AMBULATORY_CARE_PROVIDER_SITE_OTHER): Payer: Medicare Other | Admitting: Family Medicine

## 2017-10-04 ENCOUNTER — Encounter: Payer: Self-pay | Admitting: Family Medicine

## 2017-10-04 VITALS — BP 110/70 | HR 100 | Temp 97.9°F | Wt 191.0 lb

## 2017-10-04 DIAGNOSIS — R0781 Pleurodynia: Secondary | ICD-10-CM | POA: Diagnosis not present

## 2017-10-04 DIAGNOSIS — N183 Chronic kidney disease, stage 3 (moderate): Secondary | ICD-10-CM

## 2017-10-04 DIAGNOSIS — I5032 Chronic diastolic (congestive) heart failure: Secondary | ICD-10-CM | POA: Diagnosis not present

## 2017-10-04 DIAGNOSIS — R0789 Other chest pain: Secondary | ICD-10-CM

## 2017-10-04 DIAGNOSIS — R2681 Unsteadiness on feet: Secondary | ICD-10-CM | POA: Diagnosis not present

## 2017-10-04 DIAGNOSIS — R0782 Intercostal pain: Secondary | ICD-10-CM | POA: Diagnosis not present

## 2017-10-04 DIAGNOSIS — R4189 Other symptoms and signs involving cognitive functions and awareness: Secondary | ICD-10-CM | POA: Diagnosis not present

## 2017-10-04 DIAGNOSIS — I10 Essential (primary) hypertension: Secondary | ICD-10-CM | POA: Diagnosis not present

## 2017-10-04 DIAGNOSIS — R262 Difficulty in walking, not elsewhere classified: Secondary | ICD-10-CM | POA: Diagnosis not present

## 2017-10-04 DIAGNOSIS — N189 Chronic kidney disease, unspecified: Secondary | ICD-10-CM

## 2017-10-04 DIAGNOSIS — Z9181 History of falling: Secondary | ICD-10-CM | POA: Diagnosis not present

## 2017-10-04 DIAGNOSIS — I4891 Unspecified atrial fibrillation: Secondary | ICD-10-CM | POA: Diagnosis not present

## 2017-10-04 DIAGNOSIS — M545 Low back pain: Secondary | ICD-10-CM | POA: Diagnosis not present

## 2017-10-04 NOTE — Progress Notes (Signed)
Subjective:     Patient ID: Angel Meyer, female   DOB: May 08, 1933, 84 y.o.   MRN: [id number]  HPI Patient seen for hospital follow-up. She has actually had a couple recent admissions for congestive heart failure exacerbation. She is currently followed closely by cardiology and is on torsemide 40 mg twice daily and getting weekly basic metabolic panels through them.   Her chronic problems include history of obesity, hypertension, congestive heart failure, atrial fibrillation, obstructive sleep apnea, cholelithiasis without acute cholecystitis, osteoporosis, chronic kidney disease, restless leg syndrome, cognitive impairment, restless leg syndrome. Recent initiation of palliative care consult.  She was admitted most recently 12/17 through 12/22  with acute diastolic heart failure. Her weight was reduced from 195 pounds to 183 pounds. She was transitioned at that point to torsemide 20 mg twice a day and since titrated to 40 mg twice daily. Her home weights are being checked daily and discharged at 183 pound and currently up to 188 pounds.  She has had some chronic right-sided pain around her right rib cage area. She does have known gallstones but no evidence for acute cholecystitis. She's had multiple imaging studies recently including ultrasound and CT scan and even hepatobiliary function which showed patency of the common bile duct and cystic ducts and prompt uptake and biliary excretion by the liver.  She then tried on multiple medications for pain control including Tylenol, gabapentin, hydrocodone, and most recently Percocet 5/325 mg currently taking one 3 times a day and then fourth dose as needed. This has been prescribed by her orthopedist Dr Nelva Bush.  Her pain is worse with movement and also deep breathing. She notices some soreness to touch over the rib cage area. Chest x-rays and imaging above has not revealed any clear etiology. Someone in hospital suggested possible bone scan. She's not had any  fever, hemoptysis, or any obvious appetite or weight changes other than related to her weight gain from fluid retention as above  She is seeing multiple specialists including cardiology, orthopedics, pulmonary, and general surgery.  General surgery consult during hospitalization and they did not feel like she had any acute gallbladder issues. She has follow-up with pulmonary later this week to evaluate her pleuritic pain.  Past Medical History:  Diagnosis Date  . Arthritis   . CKD (chronic kidney disease)   . Diastolic dysfunction    preserved EF,  CHF In setting of afib previously  . History of blood transfusion   . Hyperlipidemia   . Hypertension   . Incontinence of urine   . OSA (obstructive sleep apnea)    does not use CPAP  . Osteoporosis   . Persistent atrial fibrillation (HCC)    failed on Flecainide, Tikosyn, and amiodarone  . Renal calculi   . Restless legs   . Rheumatic fever    age 35  . Systolic heart failure (HCC)    EF 30 to 35% per echo 08/2013,  subsequently has normalized   Past Surgical History:  Procedure Laterality Date  . ABDOMINAL HYSTERECTOMY    . BREAST SURGERY     benign biopsy  . CARDIOVERSION N/A 09/10/2013   Procedure: CARDIOVERSION;  Surgeon: Thompson Grayer, MD;  Location: [place];  Service: Cardiovascular;  Laterality: N/A;  . CARDIOVERSION N/A 11/16/2013   Procedure: CARDIOVERSION;  Surgeon: Sueanne Margarita, MD;  Location: [facility] ENDOSCOPY;  Service: Cardiovascular;  Laterality: N/A;  . CARDIOVERSION N/A 08/18/2015   Procedure: CARDIOVERSION;  Surgeon: Sueanne Margarita, MD;  Location: [place];  Service:  Cardiovascular;  Laterality: N/A;  . CARDIOVERSION N/A 03/07/2017   Procedure: CARDIOVERSION;  Surgeon: Dorothy Spark, MD;  Location: [facility] ENDOSCOPY;  Service: Cardiovascular;  Laterality: N/A;  . CARDIOVERSION N/A 05/13/2017   Procedure: CARDIOVERSION;  Surgeon: Lelon Perla, MD;  Location: [facility] ENDOSCOPY;  Service: Cardiovascular;  Laterality: N/A;   . IR RADIOLOGIST EVAL & MGMT  02/04/2017  . IR VERTEBROPLASTY LUMBAR BX INC UNI/BIL INC/INJECT/IMAGING  02/10/2017  . [place] removed  . REPLACEMENT TOTAL KNEE  2005  . SPINE SURGERY     laminectomy 1965, spinal fusion with rod 2005  . TEE WITHOUT CARDIOVERSION N/A 03/07/2017   Procedure: TRANSESOPHAGEAL ECHOCARDIOGRAM (TEE);  Surgeon: Dorothy Spark, MD;  Location: [facility] ENDOSCOPY;  Service: Cardiovascular;  Laterality: N/A;  . TEE WITHOUT CARDIOVERSION N/A 05/13/2017   Procedure: TRANSESOPHAGEAL ECHOCARDIOGRAM (TEE);  Surgeon: Lelon Perla, MD;  Location: [facility] ENDOSCOPY;  Service: Cardiovascular;  Laterality: N/A;  . TONSILLECTOMY      reports that  has never smoked. she has never used smokeless tobacco. She reports that she does not drink alcohol or use drugs. family history includes Cancer in her mother and paternal grandfather; Diabetes in her mother. Allergies  Allergen Reactions  . Ciprofloxacin Nausea And Vomiting  . Demerol [Meperidine] Nausea And Vomiting  . Sulfa Antibiotics Nausea And Vomiting and Other (See Comments)    And all derivatives -  GI Bleeding (?)  . Sulfacetamide Sodium Nausea And Vomiting and Other (See Comments)    GI bleeding, also (?)     Review of Systems  Constitutional: Negative for chills and fever.  Respiratory: Negative for cough and shortness of breath.   Cardiovascular: Positive for leg swelling. Negative for palpitations.  Gastrointestinal: Negative for abdominal pain, nausea and vomiting.  Skin: Negative for rash.  Neurological: Negative for weakness.  Hematological: Negative for adenopathy.       Objective:   Physical Exam  Constitutional: She appears well-developed and well-nourished.  Cardiovascular: Normal rate and regular rhythm.  Pulmonary/Chest: Effort normal and breath sounds normal. No respiratory distress. She has no wheezes. She has no rales.  She does have some tenderness to palpation along her  anterior and lateral right lower rib cage region but this is fairly diffuse  Abdominal: Soft. She exhibits no distension and no mass. There is no tenderness. There is no rebound and no guarding.  No epigastric or right upper quadrant tenderness to deep palpation  Musculoskeletal:  Trace nonpitting edema legs bilaterally  Neurological: She is alert.  Skin: No rash noted.       Assessment:     #1 history of diastolic heart failure with couple of recent hospitalizations currently on high-dose diuretic therapy and stable  #2 chronic kidney disease stage III  #3 several month history of persistent right-sided chest wall pain without clear etiology  #4 hypertension stable and at goal  #5 history of restless leg syndrome  #6 history of atrial fibrillation on chronic anticoagulation  #7 cognitive impairment on Namenda and Aricept  #8 polypharmacy    Plan:     -We spent quite some time going over all of her medications and recommend trying to make some simplifications.  Not clear she is benefiting from oxybutynin and we recommended discontinuing that at this time.  May consider discontinuing Namenda and Aricept but would like to do follow-up cognitive assessment in one month and make determination of that point  -Discussed appropriate goals of care. Given  her age and multiple comorbidities think palliative care consult is very reasonable  -She's had several month history of right rib cage pain without clear etiology. We discussed whether to work this up further versus pain control. We discussed consideration for bone scan to further assess  -Reiterated importance of having one provider and one practice manage pain meds and at this point that will be orthopedics with Dr. Nelva Bush.  -over 40 minutes spent face to face with patient with >50% coordinating care, discussing medications, possible further diagnostic studies  [name] MD [facility] at [facility]

## 2017-10-04 NOTE — Telephone Encounter (Signed)
done

## 2017-10-04 NOTE — Patient Instructions (Signed)
Let's try leaving off the Oxybutynin.   We will set up bone density scan.

## 2017-10-04 NOTE — Telephone Encounter (Signed)
Copied from [place] [id number]. Topic: Inquiry >> Oct 03, 2017  4:55 PM Tye Maryland wrote: Carlyon Shadow from [facility] called to check the status of the standing orders sent over, contact the number on file or fax docs to Fax [phone]

## 2017-10-05 ENCOUNTER — Inpatient Hospital Stay: Payer: Medicare Other | Admitting: Family Medicine

## 2017-10-05 ENCOUNTER — Telehealth: Payer: Self-pay | Admitting: Family Medicine

## 2017-10-05 NOTE — Telephone Encounter (Signed)
Already ordered.  We are waiting on response from radiology

## 2017-10-05 NOTE — Telephone Encounter (Signed)
Copied from [place]. Topic: Inquiry >> Oct 05, 2017  2:22 PM Malena Catholic I, NT wrote: Reason for CRM: pt Daughter Call and said they still looking for a Bone skin Order please Call Pt Daughter @[phone]

## 2017-10-05 NOTE — Telephone Encounter (Signed)
[facility] following up on orders that they need faxed back asap

## 2017-10-05 NOTE — Telephone Encounter (Signed)
Paperwork has been faxed and confirmed

## 2017-10-06 ENCOUNTER — Telehealth: Payer: Self-pay | Admitting: Family Medicine

## 2017-10-06 ENCOUNTER — Encounter: Payer: Self-pay | Admitting: Internal Medicine

## 2017-10-06 ENCOUNTER — Other Ambulatory Visit: Payer: Medicare Other | Admitting: *Deleted

## 2017-10-06 ENCOUNTER — Ambulatory Visit (INDEPENDENT_AMBULATORY_CARE_PROVIDER_SITE_OTHER): Payer: Medicare Other | Admitting: Internal Medicine

## 2017-10-06 VITALS — BP 122/62 | HR 112 | Ht 60.0 in | Wt 180.0 lb

## 2017-10-06 DIAGNOSIS — I5031 Acute diastolic (congestive) heart failure: Secondary | ICD-10-CM | POA: Diagnosis not present

## 2017-10-06 DIAGNOSIS — R0781 Pleurodynia: Secondary | ICD-10-CM

## 2017-10-06 LAB — BASIC METABOLIC PANEL
BUN/Creatinine Ratio: 20 (ref 12–28)
BUN: 27 mg/dL (ref 8–27)
CO2: 23 mmol/L (ref 20–29)
Calcium: 9.5 mg/dL (ref 8.7–10.3)
Chloride: 100 mmol/L (ref 96–106)
Creatinine, Ser: 1.38 mg/dL — ABNORMAL HIGH (ref 0.57–1.00)
GFR calc Af Amer: 41 mL/min/{1.73_m2} — ABNORMAL LOW (ref >59)
GFR calc non Af Amer: 36 mL/min/{1.73_m2} — ABNORMAL LOW (ref >59)
Glucose: 85 mg/dL (ref 65–99)
Potassium: 3.8 mmol/L (ref 3.5–5.2)
Sodium: 143 mmol/L (ref 134–144)

## 2017-10-06 LAB — DRAW FEE (FINGERSTICK)

## 2017-10-06 LAB — PRO B NATRIURETIC PEPTIDE: NT-Pro BNP: 2031 pg/mL — ABNORMAL HIGH (ref 0–738)

## 2017-10-06 NOTE — Assessment & Plan Note (Addendum)
Onset Aug 11 2017 in setting of chronic back pain  - CTa neg on R  08/13/17 and CT portion of abd  09/12/17  This is classic mscp ? From rib injury pt does not recall or  Shingles s assoc rash but clearly not pulmonary or pleural in nature at this point and there is nothing obvious on multiple ct's of chest and abd   rec Bone scan as planned  Consider trial of zostrix or gabapentin next  Pulmonary f/u is not needed   Total time devoted to counseling  > 50 % of initial 60 min office visit:  review case( including ER and inpt records)  and  with pt/caretakers discussion of options/alternatives/ personally creating written customized instructions  in presence of pt  then going over those specific  Instructions directly with the pt including how to use all of the meds but in particular covering each new medication in detail and the difference between the maintenance= "automatic" meds and the prns using an action plan format for the latter (If this problem/symptom => do that organization reading Left to right).  Please see AVS from this visit for a full list of these instructions which I personally wrote for this pt and  are unique to this visit.

## 2017-10-06 NOTE — Patient Instructions (Signed)
Bone scan is the next step  Pulmonary follow up is as needed

## 2017-10-06 NOTE — Telephone Encounter (Signed)
Please advise 

## 2017-10-06 NOTE — Telephone Encounter (Signed)
Copied from [place] [phone]. Topic: Quick Communication - Rx Refill/Question >> Oct 06, 2017  2:14 PM Scherrie Gerlach wrote: Medication: Oxybutynin.   Daughter Jeannene Patella states since the pt stopped the Oxybutynin, pt is going about every 15 min to the restroom.  They would like to resume this med since it appears to have been working. They will need resume order faxed to [place]  Please call asap!! Daughter Jeannene Patella, wants and needs to get back to [place]

## 2017-10-06 NOTE — Telephone Encounter (Deleted)
Copied from [place] [phone] [phone]. Topic: Quick Communication - Rx Refill/Question >> Oct 06, 2017  2:13 PM Scherrie Gerlach wrote: Medication: ***  Has the patient contacted their pharmacy? {yes [id number]}  (Agent: If no, request that the patient contact the pharmacy for the refill.)  Preferred Pharmacy (with phone number or street name): ***  Agent: Please be advised that RX refills may take up to 3 business days. We ask that you follow-up with your pharmacy.

## 2017-10-06 NOTE — Progress Notes (Signed)
Subjective:     Patient ID: Angel Meyer, female   DOB: [birth year], 84 y.o.   MRN: [id number]  HPI   74 yowf never smoker referred by cards s/p admit:   Admit date: 09/12/2017 Discharge date: 09/17/2017  Primary Care Provider: Eulas Meyer Primary Cardiologist: Angel. Rayann Meyer   Discharge Diagnoses    Principal Problem:   Acute diastolic heart failure [facility]) Active Problems:   Hypertension   Restless leg syndrome   Atrial fibrillation (HCC)   Anticoagulant long-term use   Goals of care, counseling/discussion   Rib pain   Palliative care encounter   CKD (chronic kidney disease)   Allergies      Allergies  Allergen Reactions  . Ciprofloxacin Nausea And Vomiting  . Demerol [Meperidine] Nausea And Vomiting  . Sulfa Antibiotics Nausea And Vomiting and Other (See Comments)    And all derivatives -  GI Bleeding (?)  . Sulfacetamide Sodium Nausea And Vomiting and Other (See Comments)    GI bleeding, also (?)     History of Present Illness     84 y.o.femalewith past medical history of diastolic CHF with EF 50-15%, permanant afib on coumadin, multiple cardioversion's, restless leg, OSA, HTN, HLD, mild dementia admitted from the office on 12/17/2018with acute on chronic CHF.  She was seen in the office by Angel Merle NP on 09/12/17 for worsening SOB, swelling and abdominal pain. She was sent to [facility] for admission.   Hospital Course     Consultants: none  1. Acute on chronic diastolic heart failure: -Net neg 5.4L. Weight down 12 lbs (195--> 183lbs). She was transitioned to demadex 20 mg BID and will continue this at DC. Check BMET one week; continue low Na diet and fluid restiction. I have asked the facility to obtain and record daily weights.   2. CKD stage III: creat 1.64 today. Check BMET one week following DC  3. Permanent Afib: rate well controlled on cardizem, metoprolol. Continue coumadin  4. HTN: well controlled. Continue current  medications  5. Abdominal pain: improved with diruesis.   6. Palliative care consult appreciated.Norco increased yesterday by palliative care; fu as outpt.  Recommendations/Plan: (as outlined by palliative care)  Recommend heat pack prn and increase activity, ROM, movement as well.   Gabapentin 100mg  PO BID  Continue Acetaminophen 500mg  PO BID  Norco 10-325mg  PO TID- she had good response with this dose and tolerated well, continue increased scheduled dose (she takes these at 8 am, 2pm and 8pm)  May have Norco 7.5-325mg  PO2000-0800 q4h prn (per regular home dose schedule)  Daily Miralax and Colace, LBM 12/20      10/06/2017 1st   office visit/ Angel Meyer  Re cw pain in pt with chronic back pain  Chief Complaint  Patient presents with  . Pulmonary Consult    Pt states that she was referred by Angel Meyer. Pt was seen in the past by Angel Meyer, last in Oct 2015.  Pt c/o rib side rib pain since Nov 2018.  She states that the pain is constant and worse with taking a deep breath. She states she just started coughing today, non prod.   chest wall pain since August 11 2017 / worse in supine position and aggravated by bendiing or twisting > breathing Taking percocet up to qid 5-325 / no h/o rash or chest wall injury that she's aware of Breathing less labored with diuresis which seems to help  the chest wall pain to some extent so fm focuse on  that issue Already on coumadin with neg CTa 08/13/17 for PE Not clear whether onset was really acute or not but fm certain  not changed much at all since original report/ no better with pain injections by Angel Meyer done in lumbar spine    No obvious day to day or daytime variability or assoc excess/ purulent sputum or mucus plugs or hemoptysi or chest tightness, subjective wheeze or overt sinus or hb symptoms. No unusual exposure hx or h/o childhood pna/ asthma or knowledge of premature birth.    Also denies any obvious fluctuation of symptoms  with weather or environmental changes or other aggravating or alleviating factors except as outlined above   Current Allergies, Complete Past Medical History, Past Surgical History, Family History, and Social History were reviewed in [employer] record.  ROS  The following are not active complaints unless bolded Hoarseness, sore throat, dysphagia, dental problems, itching, sneezing,  nasal congestion or discharge of excess mucus or purulent secretions, ear ache,   fever, chills, sweats, unintended wt loss or wt gain, classically   exertional cp,  orthopnea pnd or leg swelling, presyncope, palpitations, abdominal pain, anorexia, nausea, vomiting, diarrhea  or change in bowel habits or change in bladder habits, change in stools or change in urine, dysuria, hematuria,  rash, arthralgias, visual complaints, headache, numbness, weakness or ataxia or problems with walking or coordination,  change in mood/affect or memory.        Current Meds  Medication Sig  . diltiazem (CARDIZEM CD) 180 MG 24 hr capsule Take 1 capsule (180 mg total) by mouth daily.  Marland Kitchen diltiazem (CARDIZEM) 30 MG tablet Take 30 mg by mouth every 6 (six) hours as needed.  . donepezil (ARICEPT ODT) 10 MG disintegrating tablet Take 1 tablet (10 mg total) by mouth at bedtime.  Marland Kitchen escitalopram (LEXAPRO) 10 MG tablet TAKE 1 TABLET EVERY DAY (Patient taking differently: Take 10 mg by mouth once daily)  . Magnesium 500 MG TABS Take 500 mg by mouth at bedtime.  . Melatonin 5 MG TABS Take 5 mg at bedtime by mouth.   . memantine (NAMENDA) 10 MG tablet Take 10 mg 2 (two) times daily by mouth.  . metoprolol succinate (TOPROL-XL) 25 MG 24 hr tablet TAKE 1 TABLET BY MOUTH ONCE A DAY.  . Misc. Devices (ROLLER [place]) MISC Used as Directed.  . Multiple Vitamins-Minerals (PRESERVISION AREDS) CAPS Take 1 capsule by mouth 2 (two) times daily.  . nitroGLYCERIN (NITROSTAT) 0.4 MG SL tablet Place 1 tablet (0.4 mg total) under the tongue  every 5 (five) minutes as needed for chest pain. (Patient taking differently: Place 0.4 mg under the tongue every 5 (five) minutes x 3 doses as needed for chest pain. )  . oxyCODONE-acetaminophen (PERCOCET/ROXICET) 5-325 MG tablet Take 1 tablet by mouth every 6 (six) hours as needed for severe pain.  . polyethylene glycol powder (GLYCOLAX/MIRALAX) powder Take 17 g by mouth daily. Take as needed to produce 1 normal bowel movement per day.  Marland Kitchen rOPINIRole (REQUIP) 1 MG tablet Take 1 tablet (1 mg total) by mouth See admin instructions. Take 1 tablet (1 mg) by mouth daily at 4pm and at bedtime (Patient taking differently: Take 1 mg by mouth 2 (two) times daily. )  . sennosides-docusate sodium (SENOKOT-S) 8.6-50 MG tablet Take 1 tablet by mouth daily.  Marland Kitchen spironolactone (ALDACTONE) 25 MG tablet Take 1 tablet (25 mg total) by mouth daily.  Marland Kitchen torsemide (DEMADEX) 20 MG tablet Take 2 tablets (40  mg total) by mouth 2 (two) times daily.  Marland Kitchen triamcinolone cream (KENALOG) 0.1 % Apply 1 application topically See admin instructions. 1 application applied topically to both breasts two times a day as needed for irritation  . trimethoprim (TRIMPEX) 100 MG tablet Take 100 mg by mouth daily.   Marland Kitchen warfarin (COUMADIN) 2.5 MG tablet Take as directed by anticoagulation clinic. (Patient taking differently: Take 2.5 mg by mouth every evening. )              Review of Systems     Objective:   Physical Exam    w/c bound elderly wf nad/ does not appear to be in any distress at all    Wt Readings from Last 3 Encounters:  10/06/17 180 lb (81.6 kg)  10/04/17 191 lb (86.6 kg)  09/23/17 190 lb 1.9 oz (86.2 kg)     Vital signs reviewed - Note on arrival 02 sats  95% on RA    HEENT: nl dentition, turbinates bilaterally, and oropharynx. Nl external ear canals without cough reflex   NECK :  without JVD/Nodes/TM/ nl carotid upstrokes bilaterally   LUNGS: no acc muscle use,  Nl contour chest which is clear to A and P  bilaterally without cough on insp or exp maneuvers   CV:  RRR  no s3 or murmur or increase in P2, and  1+ ptting both lower ext sym   ABD:  soft and nontender with nl inspiratory excursion in the supine position. No bruits or organomegaly appreciated, bowel sounds nl  MS:   ext warm without deformities, calf tenderness, cyanosis or clubbing No obvious joint restrictions / pos tenderness over R ant lateral chest wall superficially s obvious rib fx  SKIN: warm and dry without lesions  - no rash over painful area of R ant lateral chest wall   NEURO:  alert, approp, nl sensorium with  no motor or cerebellar deficits apparent.        I personally reviewed images and agree with radiology impression as follows:   Chest CT portion of abd ct 09/12/17 1. No acute abnormality. 2. Cholelithiasis without CT findings of gallbladder inflammation. 3. Right inguinal hernia contains nonobstructed noninflamed distal ileum. 4. Highly extensive colonic diverticulosis without acute Inflammation.   Labs ordered/ reviewed:      Chemistry      Component Value Date/Time   NA 141 09/29/2017 1323   K 4.1 09/29/2017 1323   CL 96 09/29/2017 1323   CO2 23 09/29/2017 1323   BUN 27 09/29/2017 1323   CREATININE 1.40 (H) 09/29/2017 1323   CREATININE 1.03 (H) 06/07/2016 1007      Component Value Date/Time   CALCIUM 9.6 09/29/2017 1323   ALKPHOS 123 09/12/2017 1623   AST 29 09/12/2017 1623   ALT 18 09/12/2017 1623   BILITOT 0.9 09/12/2017 1623   BILITOT 0.6 08/29/2017 1605        Lab Results  Component Value Date   WBC 7.0 09/17/2017   HGB 14.6 09/17/2017   HCT 43.0 09/17/2017   MCV 97.1 09/17/2017   PLT 201 09/17/2017       Lab Results  Component Value Date   TSH 2.902 09/02/2017     Lab Results  Component Value Date   PROBNP 2,089 (H) 09/23/2017         Assessment:

## 2017-10-07 ENCOUNTER — Telehealth: Payer: Self-pay

## 2017-10-07 DIAGNOSIS — I5031 Acute diastolic (congestive) heart failure: Secondary | ICD-10-CM

## 2017-10-07 DIAGNOSIS — R531 Weakness: Secondary | ICD-10-CM | POA: Diagnosis not present

## 2017-10-07 DIAGNOSIS — I10 Essential (primary) hypertension: Secondary | ICD-10-CM

## 2017-10-07 MED ORDER — POTASSIUM CHLORIDE ER 10 MEQ PO TBCR: 10.0000 meq | EXTENDED_RELEASE_TABLET | Freq: Every day | ORAL | 3 refills | Status: DC

## 2017-10-07 MED ORDER — TORSEMIDE 20 MG PO TABS: ORAL_TABLET | ORAL | 3 refills | Status: DC

## 2017-10-07 MED ORDER — OXYBUTYNIN CHLORIDE ER 10 MG PO TB24: 10.0000 mg | ORAL_TABLET | Freq: Every day | ORAL | 5 refills | Status: AC

## 2017-10-07 NOTE — Telephone Encounter (Signed)
Daughter wants Apolonio Schneiders to call her back. Call back is [phone]

## 2017-10-07 NOTE — Telephone Encounter (Signed)
Angel Meyer( [facility]) is calling because they will need a written order for this Change . Please call if you have any questions at [phone]

## 2017-10-07 NOTE — Telephone Encounter (Signed)
Left message for daughter to call back. Patient lives in assisted living their number is [phone]. Will fax instructions to [phone]. Patient will need to come in on 10/13/17 for lab work. Patient's daughter will need to make lab appointment, per [facility].

## 2017-10-07 NOTE — Telephone Encounter (Signed)
Left detailed message on machine for daughter that the prescription will be faxed.

## 2017-10-07 NOTE — Telephone Encounter (Signed)
OK to start back Oxybutynin.

## 2017-10-07 NOTE — Telephone Encounter (Signed)
-----   Message from Burtis Junes, NP sent at 10/06/2017  4:50 PM EST ----- Ok to report. Need to speak to daughter as well as [facility] staff - Kidney function stable - potassium lower but still ok. Fluid level remains elevated.  Verify that she is taking 40 mg of Torsemide BID. If that is correct - increase to 60 mg in the AM and 40 mg in the early afternoon.  Would also add KCL 103meq daily BMET and BNP in one week.

## 2017-10-07 NOTE — Telephone Encounter (Signed)
Prescription faxed and confirmed 

## 2017-10-07 NOTE — Telephone Encounter (Signed)
Sent prescription of Torsemide and KCL to fax number provided. [facility] will call back if they have any questions.

## 2017-10-11 DIAGNOSIS — R0781 Pleurodynia: Secondary | ICD-10-CM | POA: Diagnosis not present

## 2017-10-11 DIAGNOSIS — M546 Pain in thoracic spine: Secondary | ICD-10-CM | POA: Diagnosis not present

## 2017-10-11 DIAGNOSIS — G894 Chronic pain syndrome: Secondary | ICD-10-CM | POA: Diagnosis not present

## 2017-10-13 ENCOUNTER — Other Ambulatory Visit: Payer: Medicare Other | Admitting: *Deleted

## 2017-10-13 DIAGNOSIS — R531 Weakness: Secondary | ICD-10-CM | POA: Diagnosis not present

## 2017-10-13 DIAGNOSIS — I5031 Acute diastolic (congestive) heart failure: Secondary | ICD-10-CM

## 2017-10-13 DIAGNOSIS — I10 Essential (primary) hypertension: Secondary | ICD-10-CM

## 2017-10-14 ENCOUNTER — Encounter (HOSPITAL_COMMUNITY): Payer: Medicare Other

## 2017-10-14 LAB — BASIC METABOLIC PANEL
BUN/Creatinine Ratio: 17 (ref 12–28)
BUN: 32 mg/dL — ABNORMAL HIGH (ref 8–27)
CO2: 26 mmol/L (ref 20–29)
Calcium: 9.8 mg/dL (ref 8.7–10.3)
Chloride: 93 mmol/L — ABNORMAL LOW (ref 96–106)
Creatinine, Ser: 1.91 mg/dL — ABNORMAL HIGH (ref 0.57–1.00)
GFR calc Af Amer: 28 mL/min/{1.73_m2} — ABNORMAL LOW (ref >59)
GFR calc non Af Amer: 24 mL/min/{1.73_m2} — ABNORMAL LOW (ref >59)
Glucose: 101 mg/dL — ABNORMAL HIGH (ref 65–99)
Potassium: 4.2 mmol/L (ref 3.5–5.2)
Sodium: 139 mmol/L (ref 134–144)

## 2017-10-14 LAB — PRO B NATRIURETIC PEPTIDE: NT-Pro BNP: 1536 pg/mL — ABNORMAL HIGH (ref 0–738)

## 2017-10-17 ENCOUNTER — Other Ambulatory Visit: Payer: Self-pay | Admitting: Nurse Practitioner

## 2017-10-17 ENCOUNTER — Other Ambulatory Visit: Payer: Medicare Other

## 2017-10-17 ENCOUNTER — Ambulatory Visit (INDEPENDENT_AMBULATORY_CARE_PROVIDER_SITE_OTHER): Payer: Medicare Other | Admitting: Nurse Practitioner

## 2017-10-17 ENCOUNTER — Encounter: Payer: Self-pay | Admitting: Nurse Practitioner

## 2017-10-17 ENCOUNTER — Other Ambulatory Visit: Payer: Self-pay | Admitting: *Deleted

## 2017-10-17 VITALS — BP 90/60 | HR 78 | Ht 60.0 in | Wt 183.0 lb

## 2017-10-17 DIAGNOSIS — I5031 Acute diastolic (congestive) heart failure: Secondary | ICD-10-CM | POA: Diagnosis not present

## 2017-10-17 DIAGNOSIS — I10 Essential (primary) hypertension: Secondary | ICD-10-CM

## 2017-10-17 DIAGNOSIS — I4819 Other persistent atrial fibrillation: Secondary | ICD-10-CM

## 2017-10-17 DIAGNOSIS — R109 Unspecified abdominal pain: Secondary | ICD-10-CM | POA: Diagnosis not present

## 2017-10-17 DIAGNOSIS — I481 Persistent atrial fibrillation: Secondary | ICD-10-CM | POA: Diagnosis not present

## 2017-10-17 DIAGNOSIS — I5032 Chronic diastolic (congestive) heart failure: Secondary | ICD-10-CM | POA: Diagnosis not present

## 2017-10-17 LAB — BASIC METABOLIC PANEL
BUN/Creatinine Ratio: 17 (ref 12–28)
BUN: 33 mg/dL — ABNORMAL HIGH (ref 8–27)
CO2: 26 mmol/L (ref 20–29)
Calcium: 9.7 mg/dL (ref 8.7–10.3)
Chloride: 92 mmol/L — ABNORMAL LOW (ref 96–106)
Creatinine, Ser: 1.9 mg/dL — ABNORMAL HIGH (ref 0.57–1.00)
GFR calc Af Amer: 28 mL/min/{1.73_m2} — ABNORMAL LOW (ref >59)
GFR calc non Af Amer: 24 mL/min/{1.73_m2} — ABNORMAL LOW (ref >59)
Glucose: 98 mg/dL (ref 65–99)
Potassium: 4.4 mmol/L (ref 3.5–5.2)
Sodium: 137 mmol/L (ref 134–144)

## 2017-10-17 LAB — PRO B NATRIURETIC PEPTIDE: NT-Pro BNP: 2014 pg/mL — ABNORMAL HIGH (ref 0–738)

## 2017-10-17 NOTE — Patient Instructions (Addendum)
We will be checking the following labs today - BMET and BNP   Medication Instructions:    Continue with your current medicines.     Testing/Procedures To Be Arranged:  N/A  Follow-Up:   See you in 6 weeks.    Other Special Instructions:   N/A    If you need a refill on your cardiac medications before your next appointment, please call your pharmacy.   Call the [place] office at [phone] if you have any questions, problems or concerns.

## 2017-10-17 NOTE — Progress Notes (Signed)
CARDIOLOGY OFFICE NOTE  Date:  10/17/2017    [name] Date of Birth: 02-15-1933 Medical Record #[id number]  PCP:  [name], MD  Cardiologist:  [place]   Chief Complaint  Patient presents with  . Shortness of Breath  . Atrial Fibrillation    Follow up visit - seen for Dr. Rayann Heman    History of Present Illness: Chieko Neises is a 84 y.o. female who presents today for a follow up visit. Seen for Dr. Rayann Heman.   She has a history of rheumatic fever at age 39, HTN, HLD, OA, incontinence, RLS, cognitive impairmentand OSA. She has hada lung nodule thatwasfollowed by pulmonary which resolved. She failed on Flecainide and Tikosyn and was onlow doseamiodarone. Other issues include diastolicCHF with EF 99-37%,JIR with permanantafib on coumadin, prior multiple cardioversion's, restless leg, & OSA.   I saw her last month - having acute CHF/abdominal/rib pain - subsequently admitted from the office. She was diuresed down 12 pounds. Rib pain noted to have resolved. Seen by palliative care as well.   I then saw her right after that admission - weight was already going back up - still having the rib pain. Was to see Dr. Nelva Bush and to go back to PCP. BNP remains elevated. Diuretics increased again at last visit.   Comes in today. Here with her daughter [name] today. Telitha is in a wheelchair. Klea has seen Dr. Nelva Bush - pain medicines have been changed. She has seen her PCP. She has seen Dr. Melvyn Novas as well. Still no answer as to why she has this rib pain. Bone scan for later this month but waiting to see Dr. Nelva Bush back this week and he may order some other type of imaging. Still having the rib pain - this is now been going on for several months - now has moved to the other side as well. Worse with breathing and moving.  More nauseated and not eating as much - probably from pain medicines. BP soft today - not really symptomatic. Swelling seems to be stable - she sits all day with  her legs down - this is not going to change. Currently on Torsemide 40 mg BID and no potassium. Lab from last week noted.   Past Medical History:  Diagnosis Date  . Arthritis   . CKD (chronic kidney disease)   . Diastolic dysfunction    preserved EF,  CHF In setting of afib previously  . History of blood transfusion   . Hyperlipidemia   . Hypertension   . Incontinence of urine   . OSA (obstructive sleep apnea)    does not use CPAP  . Osteoporosis   . Persistent atrial fibrillation (HCC)    failed on Flecainide, Tikosyn, and amiodarone  . Renal calculi   . Restless legs   . Rheumatic fever    age 67  . Systolic heart failure (HCC)    EF 30 to 35% per echo 08/2013,  subsequently has normalized    Past Surgical History:  Procedure Laterality Date  . ABDOMINAL HYSTERECTOMY    . BREAST SURGERY     benign biopsy  . CARDIOVERSION N/A 09/10/2013   Procedure: CARDIOVERSION;  Surgeon: Thompson Grayer, MD;  Location: [facility];  Service: Cardiovascular;  Laterality: N/A;  . CARDIOVERSION N/A 11/16/2013   Procedure: CARDIOVERSION;  Surgeon: Sueanne Margarita, MD;  Location: [facility];  Service: Cardiovascular;  Laterality: N/A;  . CARDIOVERSION N/A 08/18/2015   Procedure: CARDIOVERSION;  Surgeon: Tressia Miners  Remonia Richter, MD;  Location: [facility];  Service: Cardiovascular;  Laterality: N/A;  . CARDIOVERSION N/A 03/07/2017   Procedure: CARDIOVERSION;  Surgeon: Dorothy Spark, MD;  [facility] END[facility];  Service: Cardiovascular;  Laterality: N/A;  . CARDIOVERSION N/A 05/13/2017   Procedure: CARDIOVERSION;  Surgeon: Lelon Perla, MD;  Location: [facility] ENDOSCOPY;  Service: Cardiovascular;  Laterality: N/A;  . IR RADIOLOGIST EVAL & MGMT  02/04/2017  . IR VERTEBROPLASTY LUMBAR BX INC UNI/BIL INC/INJECT/IMAGING  02/10/2017  . [place] removed  . REPLACEMENT TOTAL KNEE  2005  . SPINE SURGERY     laminectomy 1965, spinal fusion with rod 2005  . TEE WITHOUT CARDIOVERSION N/A  03/07/2017   Procedure: TRANSESOPHAGEAL ECHOCARDIOGRAM (TEE);  Surgeon: Dorothy Spark, MD;  [facility] END[facility]COPY;  Service: Cardiovascular;  Laterality: N/A;  . TEE WITHOUT CARDIOVERSION N/A 05/13/2017   Procedure: TRANSESOPHAGEAL ECHOCARDIOGRAM (TEE);  Surgeon: Lelon Perla, MD;  Location: [facility] ENDOSCOPY;  Service: Cardiovascular;  Laterality: N/A;  . TONSILLECTOMY       Medications: Current Meds  Medication Sig  . diazepam (VALIUM) 2 MG tablet Take 2 mg by mouth every 6 (six) hours as needed for anxiety.   Marland Kitchen diltiazem (CARDIZEM CD) 180 MG 24 hr capsule Take 1 capsule (180 mg total) by mouth daily.  Marland Kitchen diltiazem (CARDIZEM) 30 MG tablet Take 30 mg by mouth every 6 (six) hours as needed.  . donepezil (ARICEPT ODT) 10 MG disintegrating tablet Take 1 tablet (10 mg total) by mouth at bedtime.  . donepezil (ARICEPT) 10 MG tablet Take 10 mg by mouth 2 (two) times daily.   Marland Kitchen escitalopram (LEXAPRO) 10 MG tablet TAKE 1 TABLET EVERY DAY (Patient taking differently: Take 10 mg by mouth once daily)  . HYDROcodone-acetaminophen (NORCO) 10-325 MG tablet Take 1 tablet by mouth every 6 (six) hours.   . Magnesium 500 MG TABS Take 500 mg by mouth at bedtime.  . Melatonin 5 MG TABS Take 5 mg at bedtime by mouth.   . memantine (NAMENDA) 10 MG tablet Take 10 mg 2 (two) times daily by mouth.  . metoprolol succinate (TOPROL-XL) 25 MG 24 hr tablet TAKE 1 TABLET BY MOUTH ONCE A DAY.  . Misc. Devices (ROLLER [place]) MISC Used as Directed.  . Multiple Vitamins-Minerals (PRESERVISION AREDS) CAPS Take 1 capsule by mouth 2 (two) times daily.  . nitroGLYCERIN (NITROSTAT) 0.4 MG SL tablet Place 1 tablet (0.4 mg total) under the tongue every 5 (five) minutes as needed for chest pain. (Patient taking differently: Place 0.4 mg under the tongue every 5 (five) minutes x 3 doses as needed for chest pain. )  . ondansetron (ZOFRAN) 4 MG tablet Take 4 mg by mouth every 8 (eight) hours as needed for nausea.   Marland Kitchen oxybutynin  (DITROPAN-XL) 10 MG 24 hr tablet Take 1 tablet (10 mg total) by mouth at bedtime.  Marland Kitchen oxyCODONE-acetaminophen (PERCOCET/ROXICET) 5-325 MG tablet Take 1 tablet by mouth every 6 (six) hours as needed for severe pain.  . polyethylene glycol powder (GLYCOLAX/MIRALAX) powder Take 17 g by mouth daily. Take as needed to produce 1 normal bowel movement per day.  Marland Kitchen rOPINIRole (REQUIP) 1 MG tablet Take 1 tablet (1 mg total) by mouth See admin instructions. Take 1 tablet (1 mg) by mouth daily at 4pm and at bedtime (Patient taking differently: Take 1 mg by mouth 2 (two) times daily. )  . sennosides-docusate sodium (SENOKOT-S) 8.6-50 MG tablet Take 1 tablet by  mouth daily.  Marland Kitchen spironolactone (ALDACTONE) 25 MG tablet Take 1 tablet (25 mg total) by mouth daily.  Marland Kitchen torsemide (DEMADEX) 20 MG tablet Take 40 mg by mouth 2 (two) times daily.  Marland Kitchen triamcinolone cream (KENALOG) 0.1 % Apply 1 application topically See admin instructions. 1 application applied topically to both breasts two times a day as needed for irritation  . trimethoprim (TRIMPEX) 100 MG tablet Take 100 mg by mouth daily.   Marland Kitchen trolamine salicylate (ASPERCREME) 10 % cream Apply 1 application every 12 (twelve) hours as needed topically for muscle pain.  Marland Kitchen warfarin (COUMADIN) 2.5 MG tablet Take as directed by anticoagulation clinic. (Patient taking differently: Take 2.5 mg by mouth every evening. )  . [DISCONTINUED] potassium chloride (K-DUR) 10 MEQ tablet Take 1 tablet (10 mEq total) by mouth daily.  . [DISCONTINUED] torsemide (DEMADEX) 20 MG tablet Take 60 mg  (3 tablets) by mouth in the morning and 40 mg (2 tablets) by mouth in the afternoon (Patient taking differently: Take 40 mg by mouth 2 (two) times daily. )     Allergies: Allergies  Allergen Reactions  . Ciprofloxacin Nausea And Vomiting  . Demerol [Meperidine] Nausea And Vomiting  . Sulfa Antibiotics Nausea And Vomiting and Other (See Comments)    And all derivatives -  GI Bleeding (?)  .  Sulfacetamide Sodium Nausea And Vomiting and Other (See Comments)    GI bleeding, also (?)    Social History: The patient  reports that  has never smoked. she has never used smokeless tobacco. She reports that she does not drink alcohol or use drugs.   Family History: The patient's family history includes Cancer in her mother and paternal grandfather; Diabetes in her mother.   Review of Systems: Please see the history of present illness.   Otherwise, the review of systems is positive for none.   All other systems are reviewed and negative.   Physical Exam: VS:  BP 90/60 (BP Location: Left Arm, Patient Position: Sitting, Cuff Size: Large)   Pulse 78   Ht 5' (1.524 m)   Wt 183 lb (83 kg)   SpO2 92% Comment: at rest  BMI 35.74 kg/m  .  BMI Body mass index is 35.74 kg/m.  Wt Readings from Last 3 Encounters:  10/17/17 183 lb (83 kg)  10/06/17 180 lb (81.6 kg)  10/04/17 191 lb (86.6 kg)    General: Pleasant. Elderly female. Alert and in no acute distress.  Looks chronically ill. Down 7 pounds since my last visit.  HEENT: Normal.  Neck: Supple, no JVD, carotid bruits, or masses noted.  Cardiac: Irregular rhythm.+1 to 2 edema.  Respiratory:  Lungs are clear to auscultation bilaterally with normal work of breathing. She winces with deep breathing.  GI: Soft - still tender - diffuse.  MS: No deformity or atrophy. Gait not tested Skin: Warm and dry. Color is normal.  Neuro:  Strength and sensation are intact and no gross focal deficits noted.  Psych: Alert, appropriate and with normal affect.   LABORATORY DATA:  EKG:  EKG is not ordered today.  Lab Results  Component Value Date   WBC 7.0 09/17/2017   HGB 14.6 09/17/2017   HCT 43.0 09/17/2017   PLT 201 09/17/2017   GLUCOSE 101 (H) 10/13/2017   CHOL 215 (H) 12/07/2016   TRIG 130 12/07/2016   HDL 78 12/07/2016   LDLCALC 111 (H) 12/07/2016   ALT 18 09/12/2017   AST 29 09/12/2017   NA 139 10/13/2017  K 4.2 10/13/2017   CL  93 (L) 10/13/2017   CREATININE 1.91 (H) 10/13/2017   BUN 32 (H) 10/13/2017   CO2 26 10/13/2017   TSH 2.902 09/02/2017   INR 2.36 09/17/2017     BNP (last 3 results) Recent Labs    09/07/17 1503 09/12/17 1253 09/12/17 1623  BNP 235.8* 241.0* 232.0*    ProBNP (last 3 results) Recent Labs    09/23/17 1421 10/06/17 1232 10/13/17 1445  PROBNP 2,089* 2,031* 1,536*     Other Studies Reviewed Today:  Echo Study Conclusions November 2018  - Left ventricle: The cavity size was normal. Wall thickness was increased in a pattern of mild LVH. Systolic function was normal. The estimated ejection fraction was in the range of 50% to 55%. - Mitral valve: There was mild regurgitation. - Left atrium: The atrium was mildly dilated. - Pericardium, extracardiac: A trivial pericardial effusion was identified.   Assessment/Plan:  1. Chronic diastolic heart failure: weight is down - lab from last week noted - would favor staying on current course of diuretics. Call for weight gain of 5 pounds in a week - I suspect she will continue to have exacerbations. May need to consider once or twice a week Zaroxolyn. For now, no changes.  2. Right and left sided rib pain - seeing Dr. Nelva Bush later this week - may have other imaging arranged.    3. CKD - lab from last week noted.   4. Persistent AF - managed with rate control and anticoagulation.   5. HTN - BP soft today - will follow for now.   6. Dementia - now on palliative care.   Current medicines are reviewed with the patient today.  The patient does not have concerns regarding medicines other than what has been noted above.  The following changes have been made:  See above.  Labs/ tests ordered today include:   No orders of the defined types were placed in this encounter.    Disposition:   FU with me in about 6 weeks.   Patient is agreeable to this plan and will call if any problems develop in the interim.    SignedTruitt Merle, NP  10/17/2017 11:29 AM  [facility] [address] [place], [place]  [address] Phone: [phone] Fax: [phone]

## 2017-10-18 ENCOUNTER — Telehealth: Payer: Self-pay | Admitting: *Deleted

## 2017-10-18 NOTE — Telephone Encounter (Signed)
Faxing to [facility] @ [phone] # is [phone] ov note from Truitt Merle, NP.

## 2017-10-19 DIAGNOSIS — F419 Anxiety disorder, unspecified: Secondary | ICD-10-CM | POA: Diagnosis not present

## 2017-10-19 DIAGNOSIS — M546 Pain in thoracic spine: Secondary | ICD-10-CM | POA: Diagnosis not present

## 2017-10-21 DIAGNOSIS — R531 Weakness: Secondary | ICD-10-CM | POA: Diagnosis not present

## 2017-10-22 ENCOUNTER — Emergency Department (HOSPITAL_COMMUNITY): Payer: Medicare Other

## 2017-10-22 ENCOUNTER — Encounter (HOSPITAL_COMMUNITY): Payer: Self-pay

## 2017-10-22 ENCOUNTER — Emergency Department (HOSPITAL_COMMUNITY)
Admission: EM | Admit: 2017-10-22 | Discharge: 2017-10-22 | Disposition: A | Discharge status: Home / Self Care | Payer: Medicare Other | Attending: Emergency Medicine | Admitting: Emergency Medicine

## 2017-10-22 ENCOUNTER — Other Ambulatory Visit: Payer: Self-pay | Admitting: Family Medicine

## 2017-10-22 DIAGNOSIS — W06XXXA Fall from bed, initial encounter: Secondary | ICD-10-CM | POA: Insufficient documentation

## 2017-10-22 DIAGNOSIS — I130 Hypertensive heart and chronic kidney disease with heart failure and stage 1 through stage 4 chronic kidney disease, or unspecified chronic kidney disease: Secondary | ICD-10-CM | POA: Insufficient documentation

## 2017-10-22 DIAGNOSIS — S32512A Fracture of superior rim of left pubis, initial encounter for closed fracture: Secondary | ICD-10-CM | POA: Diagnosis not present

## 2017-10-22 DIAGNOSIS — Y999 Unspecified external cause status: Secondary | ICD-10-CM | POA: Insufficient documentation

## 2017-10-22 DIAGNOSIS — S99911A Unspecified injury of right ankle, initial encounter: Secondary | ICD-10-CM | POA: Diagnosis not present

## 2017-10-22 DIAGNOSIS — Y92129 Unspecified place in nursing home as the place of occurrence of the external cause: Secondary | ICD-10-CM | POA: Insufficient documentation

## 2017-10-22 DIAGNOSIS — Z471 Aftercare following joint replacement surgery: Secondary | ICD-10-CM | POA: Diagnosis not present

## 2017-10-22 DIAGNOSIS — Z09 Encounter for follow-up examination after completed treatment for conditions other than malignant neoplasm: Secondary | ICD-10-CM

## 2017-10-22 DIAGNOSIS — S0990XA Unspecified injury of head, initial encounter: Secondary | ICD-10-CM | POA: Diagnosis not present

## 2017-10-22 DIAGNOSIS — S0003XA Contusion of scalp, initial encounter: Secondary | ICD-10-CM | POA: Insufficient documentation

## 2017-10-22 DIAGNOSIS — I5031 Acute diastolic (congestive) heart failure: Secondary | ICD-10-CM | POA: Diagnosis not present

## 2017-10-22 DIAGNOSIS — S2241XD Multiple fractures of ribs, right side, subsequent encounter for fracture with routine healing: Secondary | ICD-10-CM | POA: Diagnosis not present

## 2017-10-22 DIAGNOSIS — R791 Abnormal coagulation profile: Secondary | ICD-10-CM | POA: Insufficient documentation

## 2017-10-22 DIAGNOSIS — G8911 Acute pain due to trauma: Secondary | ICD-10-CM | POA: Diagnosis not present

## 2017-10-22 DIAGNOSIS — W19XXXA Unspecified fall, initial encounter: Secondary | ICD-10-CM

## 2017-10-22 DIAGNOSIS — S99912A Unspecified injury of left ankle, initial encounter: Secondary | ICD-10-CM | POA: Diagnosis not present

## 2017-10-22 DIAGNOSIS — T148XXA Other injury of unspecified body region, initial encounter: Secondary | ICD-10-CM | POA: Diagnosis not present

## 2017-10-22 DIAGNOSIS — Z96652 Presence of left artificial knee joint: Secondary | ICD-10-CM | POA: Diagnosis not present

## 2017-10-22 DIAGNOSIS — S199XXA Unspecified injury of neck, initial encounter: Secondary | ICD-10-CM | POA: Diagnosis not present

## 2017-10-22 DIAGNOSIS — S8991XA Unspecified injury of right lower leg, initial encounter: Secondary | ICD-10-CM | POA: Diagnosis not present

## 2017-10-22 DIAGNOSIS — S299XXA Unspecified injury of thorax, initial encounter: Secondary | ICD-10-CM | POA: Diagnosis not present

## 2017-10-22 DIAGNOSIS — R0781 Pleurodynia: Secondary | ICD-10-CM | POA: Diagnosis not present

## 2017-10-22 DIAGNOSIS — M25551 Pain in right hip: Secondary | ICD-10-CM | POA: Diagnosis not present

## 2017-10-22 DIAGNOSIS — Y939 Activity, unspecified: Secondary | ICD-10-CM | POA: Insufficient documentation

## 2017-10-22 DIAGNOSIS — N189 Chronic kidney disease, unspecified: Secondary | ICD-10-CM | POA: Diagnosis not present

## 2017-10-22 DIAGNOSIS — Z7901 Long term (current) use of anticoagulants: Secondary | ICD-10-CM | POA: Diagnosis not present

## 2017-10-22 DIAGNOSIS — S3991XA Unspecified injury of abdomen, initial encounter: Secondary | ICD-10-CM | POA: Diagnosis not present

## 2017-10-22 DIAGNOSIS — S32010D Wedge compression fracture of first lumbar vertebra, subsequent encounter for fracture with routine healing: Secondary | ICD-10-CM | POA: Diagnosis not present

## 2017-10-22 DIAGNOSIS — S79911A Unspecified injury of right hip, initial encounter: Secondary | ICD-10-CM | POA: Diagnosis not present

## 2017-10-22 DIAGNOSIS — S0083XA Contusion of other part of head, initial encounter: Secondary | ICD-10-CM | POA: Diagnosis not present

## 2017-10-22 DIAGNOSIS — R0789 Other chest pain: Secondary | ICD-10-CM | POA: Diagnosis not present

## 2017-10-22 DIAGNOSIS — R4182 Altered mental status, unspecified: Secondary | ICD-10-CM | POA: Diagnosis not present

## 2017-10-22 LAB — COMPREHENSIVE METABOLIC PANEL
ALT: 15 U/L (ref 14–54)
ANION GAP: 19 — AB (ref 5–15)
AST: 23 U/L (ref 15–41)
Albumin: 3.8 g/dL (ref 3.5–5.0)
Alkaline Phosphatase: 198 U/L — ABNORMAL HIGH (ref 38–126)
BUN: 38 mg/dL — ABNORMAL HIGH (ref 6–20)
CALCIUM: 9.3 mg/dL (ref 8.9–10.3)
CHLORIDE: 93 mmol/L — AB (ref 101–111)
CO2: 22 mmol/L (ref 22–32)
Creatinine, Ser: 2.23 mg/dL — ABNORMAL HIGH (ref 0.44–1.00)
GFR, EST AFRICAN AMERICAN: 22 mL/min — AB (ref >60)
GFR, EST NON AFRICAN AMERICAN: 19 mL/min — AB (ref >60)
Glucose, Bld: 112 mg/dL — ABNORMAL HIGH (ref 65–99)
Potassium: 3.1 mmol/L — ABNORMAL LOW (ref 3.5–5.1)
SODIUM: 134 mmol/L — AB (ref 135–145)
Total Bilirubin: 1.5 mg/dL — ABNORMAL HIGH (ref 0.3–1.2)
Total Protein: 6.6 g/dL (ref 6.5–8.1)

## 2017-10-22 LAB — CBC WITH DIFFERENTIAL/PLATELET
BASOS ABS: 0 10*3/uL (ref 0.0–0.1)
BASOS PCT: 0 %
EOS ABS: 0.1 10*3/uL (ref 0.0–0.7)
EOS PCT: 1 %
HCT: 44.3 % (ref 36.0–46.0)
Hemoglobin: 15 g/dL (ref 12.0–15.0)
Lymphocytes Relative: 10 %
Lymphs Abs: 1.1 10*3/uL (ref 0.7–4.0)
MCH: 32.9 pg (ref 26.0–34.0)
MCHC: 33.9 g/dL (ref 30.0–36.0)
MCV: 97.1 fL (ref 78.0–100.0)
MONO ABS: 0.9 10*3/uL (ref 0.1–1.0)
Monocytes Relative: 8 %
Neutro Abs: 8.9 10*3/uL — ABNORMAL HIGH (ref 1.7–7.7)
Neutrophils Relative %: 81 %
PLATELETS: 185 10*3/uL (ref 150–400)
RBC: 4.56 MIL/uL (ref 3.87–5.11)
RDW: 14.4 % (ref 11.5–15.5)
WBC: 11 10*3/uL — ABNORMAL HIGH (ref 4.0–10.5)

## 2017-10-22 LAB — PROTIME-INR
INR: 4.67 — AB
Prothrombin Time: 43.7 seconds — ABNORMAL HIGH (ref 11.4–15.2)

## 2017-10-22 MED ORDER — OXYCODONE-ACETAMINOPHEN 5-325 MG PO TABS
1.0000 | ORAL_TABLET | Freq: Once | ORAL | Status: AC
  Administered 2017-10-22: 1 via ORAL
  Filled 2017-10-22: qty 1

## 2017-10-22 MED ORDER — MORPHINE SULFATE (PF) 4 MG/ML IV SOLN
2.0000 mg | Freq: Once | INTRAVENOUS | Status: AC
  Administered 2017-10-22: 2 mg via INTRAVENOUS
  Filled 2017-10-22: qty 1

## 2017-10-22 MED ORDER — MORPHINE SULFATE (PF) 4 MG/ML IV SOLN
2.0000 mg | Freq: Once | INTRAVENOUS | Status: AC
Start: 2017-10-22 — End: 2017-10-22
  Administered 2017-10-22: 2 mg via INTRAVENOUS
  Filled 2017-10-22: qty 1

## 2017-10-22 MED ORDER — POTASSIUM CHLORIDE CRYS ER 20 MEQ PO TBCR
40.0000 meq | EXTENDED_RELEASE_TABLET | Freq: Once | ORAL | Status: AC
  Administered 2017-10-22: 40 meq via ORAL
  Filled 2017-10-22: qty 2

## 2017-10-22 NOTE — ED Triage Notes (Signed)
To hallway bed via EMS from [facility].  Pt got up from chair using walker, took two steps and fell down on knees then back on buttocks.  C/o bilateral rib cage and bilateral knee pain.  No head injury or LOC.  EMS gave Fentanyl 100 mcg.

## 2017-10-22 NOTE — ED Provider Notes (Signed)
[facility] EMERGENCY DEPARTMENT Provider Note   CSN: [id number] Arrival date & time: 10/22/17  1642     History   Chief Complaint Chief Complaint  Patient presents with  . Fall  . Rib Injury  . Hip Pain    HPI Angel Meyer is a 84 y.o. female.  HPI  84 year old female history of, CKD, hyperlipidemia hypertension on palliative care at assisted living care facility who had a fall today.  She reports no lightheadedness or loss of consciousness.  She states she fell forward and struck her knees and then her chest and her head.  He is complaining of headache and right chest wall pain.  She states that she has been having bilateral rib cage pain at home and is scheduled to have a bone scan for this.  The pain is worse after the fall.  She denies any lateralized weakness, change in vision, neck pain, dyspnea, abdominal pain, fever, chills, or urinary tract infection symptoms.  The pain is from the fall and is worse with certain movements.  She is on chronic pain medicine for her lateral rib pain.  She is taking oxycodone 10 mg tablets once every 6 hours.  She is on chronic anticoagulation for chronic atrial fibrillation.  Past Medical History:  Diagnosis Date  . Arthritis   . CKD (chronic kidney disease)   . Diastolic dysfunction    preserved EF,  CHF In setting of afib previously  . History of blood transfusion   . Hyperlipidemia   . Hypertension   . Incontinence of urine   . OSA (obstructive sleep apnea)    does not use CPAP  . Osteoporosis   . Persistent atrial fibrillation (HCC)    failed on Flecainide, Tikosyn, and amiodarone  . Renal calculi   . Restless legs   . Rheumatic fever    age 65  . Systolic heart failure (HCC)    EF 30 to 35% per echo 08/2013,  subsequently has normalized    Patient Active Problem List   Diagnosis Date Noted  . CKD (chronic kidney disease)   . Palliative care encounter   . Rib pain   . Acute diastolic heart failure ([place])  09/12/2017  . Abdominal pain 08/23/2017  . Cholelithiasis 08/23/2017  . CHF exacerbation ([place]) 08/23/2017  . Long term (current) use of anticoagulants 06/08/2017  . Lichen simplex chronicus 04/29/2017  . Risk for falls 12/17/2015  . Urinary urgency 10/29/2014  . Cognitive impairment 07/18/2014  . Goals of care, counseling/discussion 10/18/2013  . Lung nodule 10/03/2013  . Cough 08/07/2013  . Low blood pressure reading 08/07/2013  . Cerumen impaction 08/07/2013  . Anticoagulant long-term use 07/27/2013  . Acute upper respiratory infections of unspecified site 07/27/2013  . Wheezy bronchitis 07/27/2013  . OSA (obstructive sleep apnea) 12/20/2011  . Hypertension 12/20/2011  . Hyperlipidemia 12/20/2011  . Osteoarthritis 12/20/2011  . History of kidney stones 12/20/2011  . Osteoporosis 12/20/2011  . Urinary incontinence, urge 12/20/2011  . Restless leg syndrome 12/20/2011  . Atrial fibrillation ([place]) 12/20/2011    Past Surgical History:  Procedure Laterality Date  . ABDOMINAL HYSTERECTOMY    . BREAST SURGERY     benign biopsy  . CARDIOVERSION N/A 09/10/2013   Procedure: CARDIOVERSION;  Surgeon: Thompson Grayer, MD;  Location: [facility];  Service: Cardiovascular;  Laterality: N/A;  . CARDIOVERSION N/A 11/16/2013   Procedure: CARDIOVERSION;  Surgeon: Sueanne Margarita, MD;  Location: [facility];  Service: Cardiovascular;  Laterality: N/A;  .  CARDIOVERSION N/A 08/18/2015   Procedure: CARDIOVERSION;  Surgeon: Sueanne Margarita, MD;  Location: [facility] ENDOSCOPY;  Service: Cardiovascular;  Laterality: N/A;  . CARDIOVERSION N/A 03/07/2017   Procedure: CARDIOVERSION;  Surgeon: Dorothy Spark, MD;  Location: [facility] ENDOSCOPY;  Service: Cardiovascular;  Laterality: N/A;  . CARDIOVERSION N/A 05/13/2017   Procedure: CARDIOVERSION;  Surgeon: Lelon Perla, MD;  Location: [facility] ENDOSCOPY;  Service: Cardiovascular;  Laterality: N/A;  . IR RADIOLOGIST EVAL & MGMT  02/04/2017  . IR VERTEBROPLASTY LUMBAR BX INC  UNI/BIL INC/INJECT/IMAGING  02/10/2017  . [place] removed  . REPLACEMENT TOTAL KNEE  2005  . SPINE SURGERY     laminectomy 1965, spinal fusion with rod 2005  . TEE WITHOUT CARDIOVERSION N/A 03/07/2017   Procedure: TRANSESOPHAGEAL ECHOCARDIOGRAM (TEE);  Surgeon: Dorothy Spark, MD;  Location: [facility] ENDOSCOPY;  Service: Cardiovascular;  Laterality: N/A;  . TEE WITHOUT CARDIOVERSION N/A 05/13/2017   Procedure: TRANSESOPHAGEAL ECHOCARDIOGRAM (TEE);  Surgeon: Lelon Perla, MD;  Location: [facility] ENDOSCOPY;  Service: Cardiovascular;  Laterality: N/A;  . TONSILLECTOMY      OB History    No data available       Home Medications    Prior to Admission medications   Medication Sig Start Date End Date Taking? Authorizing Provider  acetaminophen (TYLENOL) 500 MG tablet Take 500 mg by mouth at bedtime.   Yes [provider]  bisacodyl (DULCOLAX) 10 MG suppository Place 10 mg rectally See admin instructions. Every Tuesday and Friday after breakfast. Hold for loose stool.   Yes [provider]  diltiazem (CARDIZEM CD) 180 MG 24 hr capsule Take 1 capsule (180 mg total) by mouth daily. 09/07/17  Yes Sherran Needs, NP  donepezil (ARICEPT ODT) 10 MG disintegrating tablet Take 1 tablet (10 mg total) by mouth at bedtime. 06/29/16  Yes Burchette, Alinda Sierras, MD  escitalopram (LEXAPRO) 10 MG tablet TAKE 1 TABLET EVERY DAY Patient taking differently: Take 10 mg by mouth once daily 03/08/17  Yes Burchette, Alinda Sierras, MD  Lidocaine (ASPERCREME LIDOCAINE) 4 % PTCH Apply 2 patches topically every morning. Under each breast. Remove at bedtime.   Yes [provider]  Magnesium 500 MG TABS Take 500 mg by mouth at bedtime.   Yes [provider]  Melatonin 5 MG TABS Take 5 mg at bedtime by mouth.    Yes [provider]  memantine (NAMENDA) 10 MG tablet Take 10 mg 2 (two) times daily by mouth.   Yes [provider]  metoprolol succinate (TOPROL-XL)  25 MG 24 hr tablet TAKE 1 TABLET BY MOUTH ONCE A DAY. Patient taking differently: TAKE 1 TABLET (25mg ) BY MOUTH ONCE A DAY. 09/23/17  Yes Burchette, Alinda Sierras, MD  Multiple Vitamins-Minerals (PRESERVISION AREDS) CAPS Take 1 capsule by mouth 2 (two) times daily.   Yes [provider]  nitroGLYCERIN (NITROSTAT) 0.4 MG SL tablet Place 1 tablet (0.4 mg total) under the tongue every 5 (five) minutes as needed for chest pain. Patient taking differently: Place 0.4 mg under the tongue every 5 (five) minutes x 3 doses as needed for chest pain.  12/07/16  Yes Burtis Junes, NP  ondansetron (ZOFRAN) 4 MG tablet Take 4 mg by mouth daily.  10/07/17  Yes [provider]  oxybutynin (DITROPAN-XL) 10 MG 24 hr tablet Take 1 tablet (10 mg total) by mouth at bedtime. 10/07/17  Yes Burchette, Alinda Sierras, MD  oxyCODONE-acetaminophen (PERCOCET) 10-325 MG tablet Take 1  tablet by mouth See admin instructions. Four times daily at 0600, 1200, 1800, 2400   Yes [provider]  polyethylene glycol powder (GLYCOLAX/MIRALAX) powder Take 17 g by mouth daily. Take as needed to produce 1 normal bowel movement per day. Patient taking differently: Take 17 g by mouth daily.  08/26/17  Yes Patrecia Pour, MD  rOPINIRole (REQUIP) 1 MG tablet Take 1 tablet (1 mg total) by mouth See admin instructions. Take 1 tablet (1 mg) by mouth daily at 4pm and at bedtime Patient taking differently: Take 1 mg by mouth See admin instructions. Take 1 tablet (1 mg) by mouth daily at 1600 and at 2000. 06/29/16  Yes Burchette, Alinda Sierras, MD  sennosides-docusate sodium (SENOKOT-S) 8.6-50 MG tablet Take 1 tablet by mouth daily.   Yes [provider]  spironolactone (ALDACTONE) 25 MG tablet Take 1 tablet (25 mg total) by mouth daily. 12/07/16  Yes Burtis Junes, NP  torsemide (DEMADEX) 20 MG tablet Take 40 mg by mouth 2 (two) times daily.   Yes [provider]  trimethoprim (TRIMPEX) 100 MG tablet Take 100 mg by mouth  daily.  05/18/15  Yes [provider]  warfarin (COUMADIN) 2.5 MG tablet Take as directed by anticoagulation clinic. Patient taking differently: Take 2.5 mg by mouth every evening.  01/25/17  Yes Burchette, Alinda Sierras, MD  donepezil (ARICEPT) 10 MG tablet Take 10 mg by mouth 2 (two) times daily.  10/12/17   [provider]  Misc. Devices (ROLLER [place]) MISC Used as Directed. 01/12/16   Burchette, Alinda Sierras, MD    Family History Family History  Problem Relation Age of Onset  . Cancer Mother        breast  . Diabetes Mother   . Cancer Paternal Grandfather     Social History Social History   Tobacco Use  . Smoking status: Never Smoker  . Smokeless tobacco: Never Used  Substance Use Topics  . Alcohol use: No  . Drug use: No     Allergies   Ciprofloxacin; Demerol [meperidine]; Sulfa antibiotics; and Sulfacetamide sodium   Review of Systems Review of Systems  All other systems reviewed and are negative.    Physical Exam Updated Vital Signs BP 123/68 (BP Location: Right Arm)   Pulse 94   Resp 18   SpO2 93%   Physical Exam  Constitutional: She is oriented to person, place, and time. She appears well-developed and well-nourished. She appears distressed.  HENT:  Head: Normocephalic.    Right Ear: External ear normal.  Left Ear: External ear normal.  Mouth/Throat: Oropharynx is clear and moist.  Eyes: EOM are normal. Pupils are equal, round, and reactive to light.  Neck: Normal range of motion. Neck supple.  Cardiovascular: An irregularly irregular rhythm present.    Pulmonary/Chest: Effort normal and breath sounds normal.  Abdominal: Soft. Bowel sounds are normal.  Musculoskeletal: She exhibits tenderness.  Bilateral knee and ankle ttp with right hip ttp, full arom Pulses and sensation intact  Neurological: She is alert and oriented to person, place, and time.  Skin: Skin is warm and dry.  Psychiatric: She has a normal mood and affect.  Nursing note  and vitals reviewed.    ED Treatments / Results  Labs (all labs ordered are listed, but only abnormal results are displayed) Labs Reviewed  CBC WITH DIFFERENTIAL/PLATELET - Abnormal; Notable for the following components:      Result Value   WBC 11.0 (*)    Neutro Abs  8.9 (*)    All other components within normal limits  COMPREHENSIVE METABOLIC PANEL - Abnormal; Notable for the following components:   Sodium 134 (*)    Potassium 3.1 (*)    Chloride 93 (*)    Glucose, Bld 112 (*)    BUN 38 (*)    Creatinine, Ser 2.23 (*)    Alkaline Phosphatase 198 (*)    Total Bilirubin 1.5 (*)    GFR calc non Af Amer 19 (*)    GFR calc Af Amer 22 (*)    Anion gap 19 (*)    All other components within normal limits  PROTIME-INR    EKG  EKG Interpretation None       Radiology No results found.  Procedures Procedures (including critical care time)  Medications Ordered in ED Medications  oxyCODONE-acetaminophen (PERCOCET/ROXICET) 5-325 MG per tablet 1 tablet (not administered)  morphine 4 MG/ML injection 2 mg (not administered)  morphine 4 MG/ML injection 2 mg (2 mg Intravenous Given 10/22/17 1731)     Initial Impression / Assessment and Plan / ED Course  I have reviewed the triage vital signs and the nursing notes.  Pertinent labs & imaging results that were available during my care of the patient were reviewed by me and considered in my medical decision making (see chart for details).     Patient seen here after fall at assisted living care facility.  She has multiple areas of pain with most being worked up- evaluation with plain x-rays and ct reveal right subacute healing posterior lateral seventh, eighth, ninth, and 10th rib fractures, mild T6 and T9 vertebral compression fractures new since 08/13/2017, subacute healing anterior left fifth rib fracture and healed posterior left 11th rib fracture.  Patient has been being evaluated for these and was scheduled to have a bone scan.   Discussed these with the patient and daughter.  They are not appear to be any new chest or rib fractures.  T6 and T9 vertebral compression fractures of uncertain chronicity, but patient does not appear to have new acute pain in these areas.  CT head reveals no evidence of acute abnormality or bleeding.  There is no acute cervical spine fracture noted.  Left ankle shows no acute abnormality, left knee shows no acute abnormality, right ankle shows no acute abnormality, right knee shows degenerative changes but no acute osseous abnormality.  There is a possible right superior pubic ramus fracture and patient is tender on the side.  She is amatory here with a walker.  She is in assisted living care facility.  Discussed that she needs to hold her Coumadin and have her INR rechecked on Monday.  We discussed return precautions for any worsening headache or mental status changes. Fall Head contusion Chronic anticoagulation use Elevated INR Mild hypokalemia Multiple healing subacute rib fractures Compression fractures T6 and T9 of unknown known age Mild hyponatremia Chronic kidney disease with elevated creatinine  Final Clinical Impressions(s) / ED Diagnoses   Final diagnoses:  Fall, initial encounter  Contusion of scalp, initial encounter  Chronic anticoagulation  Elevated INR  Closed fracture of superior ramus of left pubis, initial encounter ([place])   Please walk only with assistance Hold coumadin and have inr rechecked on Monday Return if worsening headache, weakness, or change in mental status.  Please walk only with assistance Hold coumadin and have inr rechecked on Monday Return if worsening headache, weakness, or change in mental status.  Drink plenty of fluids Recheck potassium and creatinine and  inr Monday.   ED Discharge Orders    None       Pattricia Boss, MD 10/22/17 2203

## 2017-10-22 NOTE — ED Notes (Signed)
PTAR at bedside, report given.

## 2017-10-22 NOTE — Discharge Instructions (Addendum)
Please walk only with assistance Hold coumadin and have inr rechecked on Monday Return if worsening headache, weakness, or change in mental status.  Drink plenty of fluids Recheck potassium and creatinine and inr Monday.

## 2017-10-22 NOTE — ED Notes (Signed)
Called report to Carlyon Shadow, med tech @ [facility].  Advised that daughter would be staying night with pt and referral has been placed for [facility] by Dr. Jeanell Sparrow.  Will send discharge instructions and DNR order with pt.

## 2017-10-22 NOTE — Progress Notes (Unsigned)
On call note, patient was found down, complained on pain, was sent to ER via EMS.  To PCP as FYI.

## 2017-10-23 ENCOUNTER — Telehealth: Payer: Self-pay | Admitting: Surgery

## 2017-10-23 NOTE — Telephone Encounter (Signed)
ED CM received consult concerning patient who was discharged from [facility] ED yesterday with EDP attempting to arrange [facility] services patient was told  That [facility] will be out to visit the next day. ED CM contacted patient and spoke with daughter Gilmore Laroche to clarify the [facility] referrals process. Daughter assured me that they will not be needing the assistance the ALF will provide the care at the facility. No further ED CM needs identified.

## 2017-10-24 ENCOUNTER — Other Ambulatory Visit: Payer: Medicare Other | Admitting: *Deleted

## 2017-10-24 ENCOUNTER — Ambulatory Visit (HOSPITAL_COMMUNITY): Payer: Medicare Other

## 2017-10-24 ENCOUNTER — Other Ambulatory Visit: Payer: Self-pay | Admitting: *Deleted

## 2017-10-24 ENCOUNTER — Other Ambulatory Visit (HOSPITAL_COMMUNITY): Payer: Medicare Other

## 2017-10-24 ENCOUNTER — Telehealth: Payer: Self-pay | Admitting: *Deleted

## 2017-10-24 ENCOUNTER — Ambulatory Visit (INDEPENDENT_AMBULATORY_CARE_PROVIDER_SITE_OTHER): Payer: Medicare Other | Admitting: General Practice

## 2017-10-24 DIAGNOSIS — E876 Hypokalemia: Secondary | ICD-10-CM | POA: Diagnosis not present

## 2017-10-24 DIAGNOSIS — Z7901 Long term (current) use of anticoagulants: Secondary | ICD-10-CM | POA: Diagnosis not present

## 2017-10-24 DIAGNOSIS — I4891 Unspecified atrial fibrillation: Secondary | ICD-10-CM

## 2017-10-24 NOTE — Telephone Encounter (Signed)
S/w pt's daughter stated the good news is pt was in  Hospital this weekend and had 4 right healing rib fractures and 1 left healing rib fracture.  Pt's daughter was calling to alert Cecille Rubin that the Dr. In hospital wanted Cecille Rubin to keep eye on potassium which was 3.1, did give pt extra potassium in hospital and Creatinine was 2.23.  Will send to Cecille Rubin to advise.

## 2017-10-24 NOTE — Telephone Encounter (Signed)
ER note reviewed.   She was to have INR, potassium and creatinine rechecked today - would agree with this.  Bone scan looks to be tomorrow.

## 2017-10-24 NOTE — Telephone Encounter (Signed)
S/w pt's daughter per [facility]) stated is waiting to hear back from Dr.Ramos if pt needs bone scan since the result of pt's pain was found in ER.  Stated Cecille Rubin wanted pt to have bone scan and pt's daughter stated is it because Cecille Rubin thinks it metastasized and is something else. Stated could not answer that.  Stated to see Dr. Elease Hashimoto, ASAP, after bone scan.  Pt's daughter also stated does Cecille Rubin not want to schedule the blood work for K and creat.??  Cecille Rubin it the one that handles this, does Cecille Rubin not want to do this anymore and let Dr. Elease Hashimoto be the one to manage potassium and creat.??  Will send to Cecille Rubin to advise.

## 2017-10-24 NOTE — Telephone Encounter (Signed)
S/w pt's daughter per [facility]) is aware of Lori's recommendation's.  Scheduled a bmet for today, appt made, orders in and linked.

## 2017-10-24 NOTE — Telephone Encounter (Signed)
I was trying to minimize number of visits for today.   Ok to get BMET here.   Will need INR with PCP  Would advise getting follow up visit with PCP in regards to the rib fractures and ?etiology.

## 2017-10-24 NOTE — Patient Instructions (Addendum)
Pre visit review using our clinic review tool, if applicable. No additional management support is needed unless otherwise documented below in the visit note.  Hold coumadin today and tomorrow (1/28 and 1/29) and then continue 2.5 mg daily.   Re-check 1 week. Orders faxed to [facility].  Fax # is [phone] - Fax to Carla Drape or Ivin Booty. Dosing instruction given verbally to daughter.

## 2017-10-25 ENCOUNTER — Encounter (HOSPITAL_COMMUNITY)
Admission: RE | Admit: 2017-10-25 | Discharge: 2017-10-25 | Disposition: A | Discharge status: Home / Self Care | Payer: Medicare Other | Source: Ambulatory Visit | Attending: Family Medicine | Admitting: Family Medicine

## 2017-10-25 ENCOUNTER — Ambulatory Visit (HOSPITAL_COMMUNITY)
Admission: RE | Admit: 2017-10-25 | Discharge: 2017-10-25 | Disposition: A | Discharge status: Home / Self Care | Payer: Medicare Other | Source: Ambulatory Visit | Attending: Family Medicine | Admitting: Family Medicine

## 2017-10-25 DIAGNOSIS — R0782 Intercostal pain: Secondary | ICD-10-CM

## 2017-10-25 DIAGNOSIS — S2231XA Fracture of one rib, right side, initial encounter for closed fracture: Secondary | ICD-10-CM | POA: Diagnosis not present

## 2017-10-25 DIAGNOSIS — X58XXXA Exposure to other specified factors, initial encounter: Secondary | ICD-10-CM | POA: Diagnosis not present

## 2017-10-25 DIAGNOSIS — S22000A Wedge compression fracture of unspecified thoracic vertebra, initial encounter for closed fracture: Secondary | ICD-10-CM | POA: Insufficient documentation

## 2017-10-25 DIAGNOSIS — R0789 Other chest pain: Secondary | ICD-10-CM | POA: Diagnosis present

## 2017-10-25 DIAGNOSIS — S2232XA Fracture of one rib, left side, initial encounter for closed fracture: Secondary | ICD-10-CM | POA: Insufficient documentation

## 2017-10-25 LAB — BASIC METABOLIC PANEL
BUN/Creatinine Ratio: 21 (ref 12–28)
BUN: 36 mg/dL — ABNORMAL HIGH (ref 8–27)
CO2: 23 mmol/L (ref 20–29)
Calcium: 9.4 mg/dL (ref 8.7–10.3)
Chloride: 92 mmol/L — ABNORMAL LOW (ref 96–106)
Creatinine, Ser: 1.73 mg/dL — ABNORMAL HIGH (ref 0.57–1.00)
GFR calc Af Amer: 31 mL/min/{1.73_m2} — ABNORMAL LOW (ref >59)
GFR calc non Af Amer: 27 mL/min/{1.73_m2} — ABNORMAL LOW (ref >59)
Glucose: 28 mg/dL — CL (ref 65–99)
Potassium: 5 mmol/L (ref 3.5–5.2)
Sodium: 141 mmol/L (ref 134–144)

## 2017-10-25 MED ORDER — TECHNETIUM TC 99M MEDRONATE IV KIT
20.3000 | PACK | Freq: Once | INTRAVENOUS | Status: AC | PRN
  Administered 2017-10-25: 20.3 via INTRAVENOUS

## 2017-10-26 ENCOUNTER — Other Ambulatory Visit: Payer: Self-pay | Admitting: *Deleted

## 2017-10-26 ENCOUNTER — Encounter: Payer: Self-pay | Admitting: Family Medicine

## 2017-10-26 ENCOUNTER — Ambulatory Visit (INDEPENDENT_AMBULATORY_CARE_PROVIDER_SITE_OTHER): Payer: Medicare Other | Admitting: General Practice

## 2017-10-26 ENCOUNTER — Ambulatory Visit (INDEPENDENT_AMBULATORY_CARE_PROVIDER_SITE_OTHER): Payer: Medicare Other | Admitting: Family Medicine

## 2017-10-26 VITALS — BP 124/84 | HR 81 | Temp 98.2°F | Wt 174.6 lb

## 2017-10-26 DIAGNOSIS — E162 Hypoglycemia, unspecified: Secondary | ICD-10-CM | POA: Diagnosis not present

## 2017-10-26 DIAGNOSIS — N189 Chronic kidney disease, unspecified: Secondary | ICD-10-CM | POA: Diagnosis not present

## 2017-10-26 DIAGNOSIS — I4891 Unspecified atrial fibrillation: Secondary | ICD-10-CM

## 2017-10-26 DIAGNOSIS — R4189 Other symptoms and signs involving cognitive functions and awareness: Secondary | ICD-10-CM | POA: Diagnosis not present

## 2017-10-26 DIAGNOSIS — S22000D Wedge compression fracture of unspecified thoracic vertebra, subsequent encounter for fracture with routine healing: Secondary | ICD-10-CM | POA: Diagnosis not present

## 2017-10-26 DIAGNOSIS — R1011 Right upper quadrant pain: Secondary | ICD-10-CM

## 2017-10-26 DIAGNOSIS — Z7901 Long term (current) use of anticoagulants: Secondary | ICD-10-CM

## 2017-10-26 DIAGNOSIS — R0781 Pleurodynia: Secondary | ICD-10-CM

## 2017-10-26 LAB — POCT INR: INR: 5.9

## 2017-10-26 MED ORDER — TORSEMIDE 20 MG PO TABS: 20.0000 mg | ORAL_TABLET | Freq: Two times a day (BID) | ORAL | 9 refills | Status: AC

## 2017-10-26 NOTE — Patient Instructions (Addendum)
Pre visit review using our clinic review tool, if applicable. No additional management support is needed unless otherwise documented below in the visit note.  Hold coumadin today, tomorrow and Friday. Give patient 1/2 tablet on Saturday and Sunday and re-check on Monday.   Orders faxed to [facility].  Fax # is [phone] - Fax to Carla Drape or Ivin Booty.  Dosing instructions given to daughter.

## 2017-10-26 NOTE — Patient Instructions (Signed)
Stop the Aricept and Namenda Return for fasitng glucose.

## 2017-10-26 NOTE — Progress Notes (Signed)
Subjective:     Patient ID: Angel Meyer, female   DOB: [birth year], 84 y.o.   MRN: [id number]  HPI Patient seen for hospital follow-up. She was seen on 09/2617 followed a fall. She lives in assisted living and apparently fell forward onto her knees and then chest and head but no loss of consciousness. She complained of some headache and right chest wall pain on presentation to ER. She's had background of some bilateral rib cage pain following previous injury. She takes oxycodone 10 mg 4 times daily at baseline.   Other chronic problems include history of chronic kidney disease, diastolic dysfunction, hypokalemia, hypertension, obstructive sleep apnea, osteoporosis, atrial fibrillation, restless leg syndrome, history of kidney stones.  She is followed by Dr. Nelva Bush for pain management. Patient's had some persistent right upper quadrant pain. She's had extensive evaluation including CT scan along with ultrasound and gallbladder nuclear scan which did not show any significant abnormalities. She has some chronic low-grade nausea.  Decline in appetite. Gradual weight loss.  Recent ER notes reviewed. Potassium low at 3.1. She had potassium replacement and had follow-up labs through cardiology 2 days ago and potassium was back to normal.   Surprisingly, she had CBG of 28. She does not take any hyperglycemic medications. She felt generally poor but was not having confusion (above baseline) and accuracy of lab was somewhat in question.  Patient had multiple x-rays including recent bone scan which showed previous compression fractures T6 and T9 along with bilateral rib fractures which are healing. She had multiple x-rays including CT of the head with no acute normality. Cervical spine no fracture. Left knee no acute abnormalities. Right ankle no acute abnormalities. Right knee degenerative changes without acute abnormalities. Possible right superior pubic ramus fracture.  Elevated INR of 4.3. Coumadin held for  couple days and recommended follow-up early next week  Past Medical History:  Diagnosis Date  . Arthritis   . CKD (chronic kidney disease)   . Diastolic dysfunction    preserved EF,  CHF In setting of afib previously  . History of blood transfusion   . Hyperlipidemia   . Hypertension   . Incontinence of urine   . OSA (obstructive sleep apnea)    does not use CPAP  . Osteoporosis   . Persistent atrial fibrillation (HCC)    failed on Flecainide, Tikosyn, and amiodarone  . Renal calculi   . Restless legs   . Rheumatic fever    age 21  . Systolic heart failure (HCC)    EF 30 to 35% per echo 08/2013,  subsequently has normalized   Past Surgical History:  Procedure Laterality Date  . ABDOMINAL HYSTERECTOMY    . BREAST SURGERY     benign biopsy  . CARDIOVERSION N/A 09/10/2013   Procedure: CARDIOVERSION;  Surgeon: Thompson Grayer, MD;  Location: [facility];  Service: Cardiovascular;  Laterality: N/A;  . CARDIOVERSION N/A 11/16/2013   Procedure: CARDIOVERSION;  Surgeon: Sueanne Margarita, MD;  Location: [facility] ENDOSCOPY;  Service: Cardiovascular;  Laterality: N/A;  . CARDIOVERSION N/A 08/18/2015   Procedure: CARDIOVERSION;  Surgeon: Sueanne Margarita, MD;  Location: [facility] ENDOSCOPY;  Service: Cardiovascular;  Laterality: N/A;  . CARDIOVERSION N/A 03/07/2017   Procedure: CARDIOVERSION;  Surgeon: Dorothy Spark, MD;  Location: [facility] ENDOSCOPY;  Service: Cardiovascular;  Laterality: N/A;  . CARDIOVERSION N/A 05/13/2017   Procedure: CARDIOVERSION;  Surgeon: Lelon Perla, MD;  Location: [facility] ENDOSCOPY;  Service: Cardiovascular;  Laterality: N/A;  . IR RADIOLOGIST EVAL & MGMT  02/04/2017  . IR VERTEBROPLASTY LUMBAR BX INC UNI/BIL INC/INJECT/IMAGING  02/10/2017  . [place] removed  . REPLACEMENT TOTAL KNEE  2005  . SPINE SURGERY     laminectomy 1965, spinal fusion with rod 2005  . TEE WITHOUT CARDIOVERSION N/A 03/07/2017   Procedure: TRANSESOPHAGEAL ECHOCARDIOGRAM (TEE);  Surgeon: Dorothy Spark, MD;  Location: [facility] ENDOSCOPY;  Service: Cardiovascular;  Laterality: N/A;  . TEE WITHOUT CARDIOVERSION N/A 05/13/2017   Procedure: TRANSESOPHAGEAL ECHOCARDIOGRAM (TEE);  Surgeon: Lelon Perla, MD;  Location: [facility] ENDOSCOPY;  Service: Cardiovascular;  Laterality: N/A;  . TONSILLECTOMY      reports that  has never smoked. she has never used smokeless tobacco. She reports that she does not drink alcohol or use drugs. family history includes Cancer in her mother and paternal grandfather; Diabetes in her mother. Allergies  Allergen Reactions  . Ciprofloxacin Nausea And Vomiting  . Demerol [Meperidine] Nausea And Vomiting  . Sulfa Antibiotics Nausea And Vomiting and Other (See Comments)    And all derivatives -  GI Bleeding (?)  . Sulfacetamide Sodium Nausea And Vomiting and Other (See Comments)    GI bleeding, also (?)     Review of Systems  Constitutional: Positive for appetite change and fatigue. Negative for chills and fever.  Respiratory: Negative for cough.   Cardiovascular: Negative for chest pain.  Gastrointestinal: Positive for nausea. Negative for diarrhea and vomiting.  Genitourinary: Negative for dysuria.  Musculoskeletal: Positive for back pain.  Neurological: Negative for dizziness.  Psychiatric/Behavioral: Negative for agitation.       Objective:   Physical Exam  Constitutional: She appears well-developed and well-nourished.  HENT:  Mouth/Throat: Oropharynx is clear and moist.  Neck: Neck supple.  Cardiovascular: Normal rate.  Pulmonary/Chest: Effort normal and breath sounds normal. No respiratory distress. She has no wheezes. She has no rales.  Abdominal: Soft. There is no tenderness. There is no guarding.  Musculoskeletal: She exhibits no edema.  Neurological: She is alert.       Assessment:     #1 chronic rib and back pain with history of multiple rib fractures and compression fractures T6 and T9.  Pain poorly controlled on Oxycodone  #2 poor  appetite and some chronic nausea. Question medication related (Aricept, Oxycodone). No evidence for gallstones and recent gallbladder nuclear study unremarkable  #3 history of dementia. Suspect very little benefit from Aricept and Namenda at this point  #4 recent hypokalemia  #5 recent capillary blood glucose of 28 which is surprising since she is not on any glycemic medications  #6 chronic Coumadin therapy recently over replaced with INR 4.3  #7 history of atrial fibrillation      Plan:     -discontinue Aricept and Namenda -Recent bone scan results reviewed with patient and daughter -Return for fasting blood glucose -Continue follow-up with Dr. Nelva Bush regarding her chronic pain -we discussed other possible GI workup for her nausea with either GI referral or possible gastric emptying study at this point it was to wait -consider trial of PPI to see if this helps her nausea any. -over 40 minutes spent with patient and daughter- of which > 50% in direct assessment and counseling regarding recent labs/x-rays, review of medications, discussing future labs, reviewing things to watch for.  [name] MD [facility] at [facility]

## 2017-10-26 NOTE — Telephone Encounter (Signed)
Pt's daughter calling in today stated pt did bone scan, just showed fractures and osteoporosis.  Pt is not eating or drinking and is down 5 lbs.  INR Saturday was 4.2, held coumadin pt got INR checked today 5.9.  Saw Dr. Sheldon Silvan stated pt is very nausea thinks it is from high dose of torsemide (40 mg ) bid.  S/w Truitt Merle, NP, decreased torsemide to (20 mg ) bid.  Lori left for the day, Ignacia Bayley, NP, signed order for torsemide.  Faxing to [facility] @ [phone] pts. Order.

## 2017-10-27 DIAGNOSIS — S22000A Wedge compression fracture of unspecified thoracic vertebra, initial encounter for closed fracture: Secondary | ICD-10-CM | POA: Insufficient documentation

## 2017-10-28 ENCOUNTER — Inpatient Hospital Stay: Payer: Medicare Other | Admitting: Family Medicine

## 2017-10-28 ENCOUNTER — Telehealth: Payer: Self-pay | Admitting: Family Medicine

## 2017-10-28 DIAGNOSIS — R531 Weakness: Secondary | ICD-10-CM | POA: Diagnosis not present

## 2017-10-28 NOTE — Telephone Encounter (Signed)
Copied from [place]. Topic: Quick Communication - See Telephone Encounter >> Oct 28, 2017  3:01 PM Bea Graff, NT wrote: CRM for notification. See Telephone encounter for: Angel Meyer from [facility] calling about a FYI that was faxed on 10/18/17 about that the patients lidocaine patch was not removed the night before and they needed a signature on the FYI and faxed back. Fax#: [phone]. Pleae advise.  10/28/17.

## 2017-10-31 ENCOUNTER — Ambulatory Visit: Payer: Medicare Other

## 2017-10-31 ENCOUNTER — Ambulatory Visit (INDEPENDENT_AMBULATORY_CARE_PROVIDER_SITE_OTHER): Payer: Medicare Other | Admitting: General Practice

## 2017-10-31 DIAGNOSIS — Z7901 Long term (current) use of anticoagulants: Secondary | ICD-10-CM | POA: Diagnosis not present

## 2017-10-31 DIAGNOSIS — I4891 Unspecified atrial fibrillation: Secondary | ICD-10-CM

## 2017-10-31 LAB — POCT INR: INR: 6.4

## 2017-10-31 NOTE — Patient Instructions (Addendum)
Pre visit review using our clinic review tool, if applicable. No additional management support is needed unless otherwise documented below in the visit note.  Hold coumadin until after injection on 2/12.  Asked daughter to call Villa Herb, RN on 2/12 and report INR.   Orders faxed to [facility].  Fax # is [phone] - Fax to Carla Drape or Ivin Booty. Dosing instruction given to daughter. (Patient's prior dosage has been 2.5 mg daily).

## 2017-10-31 NOTE — Telephone Encounter (Signed)
Did you send bone scan results also?  Thanks.

## 2017-10-31 NOTE — Telephone Encounter (Signed)
-----   Message from [name], MD sent at 10/27/2017  8:33 AM EST ----- Arville Go,  Could you send copy of this office note and copy of her recent bone scan to Dr Nelva Bush with [facility]?  Thanks!

## 2017-10-31 NOTE — Telephone Encounter (Signed)
Bone scan results faxed to [phone].

## 2017-10-31 NOTE — Telephone Encounter (Signed)
Office notes faxed to Dr Nelva Bush at [phone].

## 2017-11-01 ENCOUNTER — Telehealth: Payer: Self-pay | Admitting: Family Medicine

## 2017-11-01 NOTE — Telephone Encounter (Signed)
Copied from [place] [phone]. Topic: Quick Communication - See Telephone Encounter >> Nov 01, 2017 12:29 PM Aurelio Brash B wrote: CRM for notification. See Telephone encounter for:  Jenny Reichmann from [facility] called to confirm  fax concerning rx for Aspercreme lidocaine patch was received and said she needs it signed and faxed back  11/01/17.

## 2017-11-02 NOTE — Telephone Encounter (Signed)
Thanks for your input.  Agree with Hospice consideration.

## 2017-11-02 NOTE — Telephone Encounter (Signed)
I have not seen yet

## 2017-11-02 NOTE — Telephone Encounter (Signed)
I have spoken to Angel Meyer regarding her mom - she has dropped another 10 pounds below her dry weight. She is not eating. Coumadin level is very difficult to control. They are considering Hospice care if the back injection fails to offer relief. Seen by palliative care earlier today.   I favor proceeding with the steroid shot if this will alleviate her pain - we can adjust her diuretics if needed if the goal is to get her comfortable and provide pain relief.   I am also in favor of Hospice care   I have cut her dose of Torsemide to 20 mg just once a day - order faxed to [facility].

## 2017-11-02 NOTE — Telephone Encounter (Signed)
Pt's daughter calling in due to pt is seeing palliative care tomorrow and pt's daughter wanted to know if pt gets a high dose of steroid for back would this be ok??  Nurse stated would cause pt to retain fluid and pt's daughter wanted Lori's advise.  Asked a lot of questions about coumadin stated this needed to be addressed with the coumadin clinic and also stated is Cecille Rubin still fine with pt being on torsemide 20 mg bid.  Stated discuss this last time was decreased due to pt's nausea.  Will send to [facility].

## 2017-11-03 ENCOUNTER — Telehealth: Payer: Self-pay | Admitting: Family Medicine

## 2017-11-03 ENCOUNTER — Telehealth: Payer: Self-pay | Admitting: Nurse Practitioner

## 2017-11-03 DIAGNOSIS — R531 Weakness: Secondary | ICD-10-CM | POA: Diagnosis not present

## 2017-11-03 NOTE — Telephone Encounter (Signed)
[facility] @ Hospice called to check on request. They would like order to admit pt to Hospice facility today. They are aware Dr. Elease Hashimoto is not in office.   Dr. Maudie Mercury - Please advise if ok for verbal order until Dr. Elease Hashimoto returns tomorrow? Thanks!

## 2017-11-03 NOTE — Telephone Encounter (Signed)
Copied from [place]. Topic: General - Other >> Nov 03, 2017  2:04 PM Yvette Rack wrote: Reason for CRM: Estill Bamberg from [facility] [phone] is calling stating that they had faxed over a medical clearance on 10-31-17 for pt to stop the warfarin (COUMADIN) 2.5 MG tablet STARTING TODAY for 5 days the patient has a lumbar injection scheduled on 11-08-17

## 2017-11-03 NOTE — Telephone Encounter (Signed)
Angel Meyer calling back, requesting to know ASAP, med will need to be stopped today or procedure will need to be rescehuled. Call back [phone] ext [phone]

## 2017-11-03 NOTE — Telephone Encounter (Signed)
I spoke with Angel Meyer, Dr. Maudie Mercury gave approval for verbal order for Hospice care, Dr. Elease Hashimoto was out of office today. Angel Meyer said she would look into this.

## 2017-11-03 NOTE — Telephone Encounter (Signed)
Copied from [place] [phone]. Topic: Quick Communication - See Telephone Encounter >> Nov 03, 2017 10:37 AM Robina Ade, Helene Kelp D wrote: CRM for notification. See Telephone encounter for: 11/03/17. Wynell Balloon from [facility] called and would like to talk to someone about having patient brought to hospice because patient needs it. She would like an order sent to them. Rodena Piety would like to talk to a CMA or another provider about this. She can be contacted at [phone].

## 2017-11-03 NOTE — Telephone Encounter (Signed)
Spoke with [facility]. Gave verbal order to admit to Hospice facility. She states that pt is trending down quickly. She is not eating more than 200 calories per day, and only that with assistance. She recommends using the following dx codes for admission request: CHF, Protein calorie malnutrition, Dementia and weight loss  Dr. Elease Hashimoto - She is having office send over order via fax to sign. Thanks!

## 2017-11-03 NOTE — Telephone Encounter (Signed)
New Message   Pt c/o medication issue:  1. Name of Medication: Torsemide  2. How are you currently taking this medication (dosage and times per day)? 2mg   3. Are you having a reaction (difficulty breathing--STAT)? none  4. What is your medication issue?  Angel Meyer is calling on behalf of her mother. She states that Angel Meyer was to fax an prescription to [facility]. Angel Meyer was changing the torsemide to 20 mg just once a day. But [facility] has not received the order. Please call to discuss and resend order to [facility]

## 2017-11-03 NOTE — Telephone Encounter (Signed)
See prior phone note - Torsemide was cut to 20 mg a day. Order was faxed to [facility] yesterday as well.

## 2017-11-03 NOTE — Telephone Encounter (Signed)
Called [facility] to clarify fax number- [phone].  Faxed over order to [facility].   Spoke with daughter and made her aware that orders have been faxed over to [facility] again.  Daughter appreciative for call.

## 2017-11-03 NOTE — Telephone Encounter (Signed)
Is she already under hospice care? If sp. PCP in office in the morning - ok to send orders for PCP to sing in next 5 days - that is typically what is done with my patients. Thanks.

## 2017-11-03 NOTE — Telephone Encounter (Signed)
Dtr tells me that she spoke with Cecille Rubin yesterday around 4:45 pm and was told she was going to decrease Torsemide to 20 mg once daily. Informed that I cannot find documentation of that and Cecille Rubin is not in the office today.   Explained that Cecille Rubin may confirm this today or that it may be tomorrow before we can address/confirm. Dtr verbalized understanding.

## 2017-11-04 ENCOUNTER — Telehealth: Payer: Self-pay | Admitting: Family Medicine

## 2017-11-04 DIAGNOSIS — N184 Chronic kidney disease, stage 4 (severe): Secondary | ICD-10-CM | POA: Diagnosis not present

## 2017-11-04 DIAGNOSIS — F0150 Vascular dementia without behavioral disturbance: Secondary | ICD-10-CM | POA: Diagnosis not present

## 2017-11-04 DIAGNOSIS — R0902 Hypoxemia: Secondary | ICD-10-CM | POA: Diagnosis not present

## 2017-11-04 DIAGNOSIS — N390 Urinary tract infection, site not specified: Secondary | ICD-10-CM | POA: Diagnosis not present

## 2017-11-04 DIAGNOSIS — I10 Essential (primary) hypertension: Secondary | ICD-10-CM | POA: Diagnosis not present

## 2017-11-04 DIAGNOSIS — R0600 Dyspnea, unspecified: Secondary | ICD-10-CM | POA: Diagnosis not present

## 2017-11-04 DIAGNOSIS — I4891 Unspecified atrial fibrillation: Secondary | ICD-10-CM | POA: Diagnosis not present

## 2017-11-04 DIAGNOSIS — L280 Lichen simplex chronicus: Secondary | ICD-10-CM | POA: Diagnosis not present

## 2017-11-04 DIAGNOSIS — I509 Heart failure, unspecified: Secondary | ICD-10-CM | POA: Diagnosis not present

## 2017-11-04 DIAGNOSIS — G2581 Restless legs syndrome: Secondary | ICD-10-CM | POA: Diagnosis not present

## 2017-11-04 DIAGNOSIS — E785 Hyperlipidemia, unspecified: Secondary | ICD-10-CM | POA: Diagnosis not present

## 2017-11-04 DIAGNOSIS — N3281 Overactive bladder: Secondary | ICD-10-CM | POA: Diagnosis not present

## 2017-11-04 NOTE — Telephone Encounter (Signed)
Copied from [place]. Topic: General - Other >> Nov 04, 2017  8:42 AM Yvette Rack wrote: Reason for CRM: Jenn from [facility] calling to get verbal order care Dr Elease Hashimoto is the attending provider they would like for pt current office visits a Nurse will be going out today at 10:00 today to see patient  please fax papers to [phone]

## 2017-11-04 NOTE — Telephone Encounter (Signed)
OK   We will sign any necessary orders.

## 2017-11-04 NOTE — Telephone Encounter (Signed)
Verbal orders given to [facility]

## 2017-11-06 DIAGNOSIS — I10 Essential (primary) hypertension: Secondary | ICD-10-CM | POA: Diagnosis not present

## 2017-11-06 DIAGNOSIS — N184 Chronic kidney disease, stage 4 (severe): Secondary | ICD-10-CM | POA: Diagnosis not present

## 2017-11-06 DIAGNOSIS — F0150 Vascular dementia without behavioral disturbance: Secondary | ICD-10-CM | POA: Diagnosis not present

## 2017-11-06 DIAGNOSIS — I509 Heart failure, unspecified: Secondary | ICD-10-CM | POA: Diagnosis not present

## 2017-11-06 DIAGNOSIS — E785 Hyperlipidemia, unspecified: Secondary | ICD-10-CM | POA: Diagnosis not present

## 2017-11-06 DIAGNOSIS — I4891 Unspecified atrial fibrillation: Secondary | ICD-10-CM | POA: Diagnosis not present

## 2017-11-07 DIAGNOSIS — N184 Chronic kidney disease, stage 4 (severe): Secondary | ICD-10-CM | POA: Diagnosis not present

## 2017-11-07 DIAGNOSIS — F0150 Vascular dementia without behavioral disturbance: Secondary | ICD-10-CM | POA: Diagnosis not present

## 2017-11-07 DIAGNOSIS — I4891 Unspecified atrial fibrillation: Secondary | ICD-10-CM | POA: Diagnosis not present

## 2017-11-07 DIAGNOSIS — I509 Heart failure, unspecified: Secondary | ICD-10-CM | POA: Diagnosis not present

## 2017-11-07 DIAGNOSIS — I10 Essential (primary) hypertension: Secondary | ICD-10-CM | POA: Diagnosis not present

## 2017-11-07 DIAGNOSIS — E785 Hyperlipidemia, unspecified: Secondary | ICD-10-CM | POA: Diagnosis not present

## 2017-11-08 ENCOUNTER — Telehealth: Payer: Self-pay | Admitting: Family Medicine

## 2017-11-08 NOTE — Telephone Encounter (Signed)
Spoke with [facility] and they are Fentanyl patches.  Okay to d/c?

## 2017-11-08 NOTE — Telephone Encounter (Signed)
I am confused and need some clarification.  Palliative care just saw her and just started the patches.  Why are we getting call to d/c?  Fentanyl is changed every 3 days- or, are they asking to stop them altogether?  If so, why?/

## 2017-11-08 NOTE — Telephone Encounter (Signed)
Spoke with nurse at [facility] and they are going to call the doctor that prescribed Fentanyl for orders.

## 2017-11-08 NOTE — Telephone Encounter (Signed)
Copied from [place] [phone]. Topic: Quick Communication - See Telephone Encounter >> Nov 08, 2017 10:02 AM [name] wrote: CRM for notification. See Telephone encounter for:   Fax [place] [phone]  signed order needed for removal of patches. Dr. Jonni Sanger had given verbal order for 2 patches   She said these should be taken off tomorrow 11/08/17.

## 2017-11-08 NOTE — Telephone Encounter (Signed)
Make sure to clarify Lidocaine patches.  OK to d/c those.

## 2017-11-09 DIAGNOSIS — I4891 Unspecified atrial fibrillation: Secondary | ICD-10-CM | POA: Diagnosis not present

## 2017-11-09 DIAGNOSIS — E785 Hyperlipidemia, unspecified: Secondary | ICD-10-CM | POA: Diagnosis not present

## 2017-11-09 DIAGNOSIS — I509 Heart failure, unspecified: Secondary | ICD-10-CM | POA: Diagnosis not present

## 2017-11-09 DIAGNOSIS — F0150 Vascular dementia without behavioral disturbance: Secondary | ICD-10-CM | POA: Diagnosis not present

## 2017-11-09 DIAGNOSIS — N184 Chronic kidney disease, stage 4 (severe): Secondary | ICD-10-CM | POA: Diagnosis not present

## 2017-11-09 DIAGNOSIS — I10 Essential (primary) hypertension: Secondary | ICD-10-CM | POA: Diagnosis not present

## 2017-11-11 DIAGNOSIS — I10 Essential (primary) hypertension: Secondary | ICD-10-CM | POA: Diagnosis not present

## 2017-11-11 DIAGNOSIS — N184 Chronic kidney disease, stage 4 (severe): Secondary | ICD-10-CM | POA: Diagnosis not present

## 2017-11-11 DIAGNOSIS — I4891 Unspecified atrial fibrillation: Secondary | ICD-10-CM | POA: Diagnosis not present

## 2017-11-11 DIAGNOSIS — I509 Heart failure, unspecified: Secondary | ICD-10-CM | POA: Diagnosis not present

## 2017-11-11 DIAGNOSIS — F0150 Vascular dementia without behavioral disturbance: Secondary | ICD-10-CM | POA: Diagnosis not present

## 2017-11-11 DIAGNOSIS — E785 Hyperlipidemia, unspecified: Secondary | ICD-10-CM | POA: Diagnosis not present

## 2017-11-14 DIAGNOSIS — F0150 Vascular dementia without behavioral disturbance: Secondary | ICD-10-CM | POA: Diagnosis not present

## 2017-11-14 DIAGNOSIS — I509 Heart failure, unspecified: Secondary | ICD-10-CM | POA: Diagnosis not present

## 2017-11-14 DIAGNOSIS — N184 Chronic kidney disease, stage 4 (severe): Secondary | ICD-10-CM | POA: Diagnosis not present

## 2017-11-14 DIAGNOSIS — I10 Essential (primary) hypertension: Secondary | ICD-10-CM | POA: Diagnosis not present

## 2017-11-14 DIAGNOSIS — E785 Hyperlipidemia, unspecified: Secondary | ICD-10-CM | POA: Diagnosis not present

## 2017-11-14 DIAGNOSIS — I4891 Unspecified atrial fibrillation: Secondary | ICD-10-CM | POA: Diagnosis not present

## 2017-11-15 DIAGNOSIS — I509 Heart failure, unspecified: Secondary | ICD-10-CM | POA: Diagnosis not present

## 2017-11-15 DIAGNOSIS — E785 Hyperlipidemia, unspecified: Secondary | ICD-10-CM | POA: Diagnosis not present

## 2017-11-15 DIAGNOSIS — F0150 Vascular dementia without behavioral disturbance: Secondary | ICD-10-CM | POA: Diagnosis not present

## 2017-11-15 DIAGNOSIS — I10 Essential (primary) hypertension: Secondary | ICD-10-CM | POA: Diagnosis not present

## 2017-11-15 DIAGNOSIS — N184 Chronic kidney disease, stage 4 (severe): Secondary | ICD-10-CM | POA: Diagnosis not present

## 2017-11-15 DIAGNOSIS — I4891 Unspecified atrial fibrillation: Secondary | ICD-10-CM | POA: Diagnosis not present

## 2017-11-18 DIAGNOSIS — I509 Heart failure, unspecified: Secondary | ICD-10-CM | POA: Diagnosis not present

## 2017-11-18 DIAGNOSIS — E785 Hyperlipidemia, unspecified: Secondary | ICD-10-CM | POA: Diagnosis not present

## 2017-11-18 DIAGNOSIS — I4891 Unspecified atrial fibrillation: Secondary | ICD-10-CM | POA: Diagnosis not present

## 2017-11-18 DIAGNOSIS — I10 Essential (primary) hypertension: Secondary | ICD-10-CM | POA: Diagnosis not present

## 2017-11-18 DIAGNOSIS — F0150 Vascular dementia without behavioral disturbance: Secondary | ICD-10-CM | POA: Diagnosis not present

## 2017-11-18 DIAGNOSIS — N184 Chronic kidney disease, stage 4 (severe): Secondary | ICD-10-CM | POA: Diagnosis not present

## 2017-11-21 ENCOUNTER — Telehealth: Payer: Self-pay

## 2017-11-21 DIAGNOSIS — I509 Heart failure, unspecified: Secondary | ICD-10-CM | POA: Diagnosis not present

## 2017-11-21 DIAGNOSIS — F0150 Vascular dementia without behavioral disturbance: Secondary | ICD-10-CM | POA: Diagnosis not present

## 2017-11-21 DIAGNOSIS — I10 Essential (primary) hypertension: Secondary | ICD-10-CM | POA: Diagnosis not present

## 2017-11-21 DIAGNOSIS — N184 Chronic kidney disease, stage 4 (severe): Secondary | ICD-10-CM | POA: Diagnosis not present

## 2017-11-21 DIAGNOSIS — I4891 Unspecified atrial fibrillation: Secondary | ICD-10-CM | POA: Diagnosis not present

## 2017-11-21 DIAGNOSIS — E785 Hyperlipidemia, unspecified: Secondary | ICD-10-CM | POA: Diagnosis not present

## 2017-11-21 NOTE — Telephone Encounter (Signed)
Copied from [place]. Topic: Inquiry >> Nov 21, 2017  1:44 PM Margot Ables wrote: Reason for CRM: 1 plan of care and 2 supplemental orders have been faxed over for pt. Each has been sent 3x and have not received a signed/returned copy of order for Medicare billing. Please advise. Return Fax# [phone]

## 2017-11-22 DIAGNOSIS — I10 Essential (primary) hypertension: Secondary | ICD-10-CM | POA: Diagnosis not present

## 2017-11-22 DIAGNOSIS — I4891 Unspecified atrial fibrillation: Secondary | ICD-10-CM | POA: Diagnosis not present

## 2017-11-22 DIAGNOSIS — N184 Chronic kidney disease, stage 4 (severe): Secondary | ICD-10-CM | POA: Diagnosis not present

## 2017-11-22 DIAGNOSIS — I509 Heart failure, unspecified: Secondary | ICD-10-CM | POA: Diagnosis not present

## 2017-11-22 DIAGNOSIS — F0150 Vascular dementia without behavioral disturbance: Secondary | ICD-10-CM | POA: Diagnosis not present

## 2017-11-22 DIAGNOSIS — E785 Hyperlipidemia, unspecified: Secondary | ICD-10-CM | POA: Diagnosis not present

## 2017-11-25 DIAGNOSIS — E785 Hyperlipidemia, unspecified: Secondary | ICD-10-CM | POA: Diagnosis not present

## 2017-11-25 DIAGNOSIS — N184 Chronic kidney disease, stage 4 (severe): Secondary | ICD-10-CM | POA: Diagnosis not present

## 2017-11-25 DIAGNOSIS — F0150 Vascular dementia without behavioral disturbance: Secondary | ICD-10-CM | POA: Diagnosis not present

## 2017-11-25 DIAGNOSIS — G2581 Restless legs syndrome: Secondary | ICD-10-CM | POA: Diagnosis not present

## 2017-11-25 DIAGNOSIS — I10 Essential (primary) hypertension: Secondary | ICD-10-CM | POA: Diagnosis not present

## 2017-11-25 DIAGNOSIS — N390 Urinary tract infection, site not specified: Secondary | ICD-10-CM | POA: Diagnosis not present

## 2017-11-25 DIAGNOSIS — N3281 Overactive bladder: Secondary | ICD-10-CM | POA: Diagnosis not present

## 2017-11-25 DIAGNOSIS — R0600 Dyspnea, unspecified: Secondary | ICD-10-CM | POA: Diagnosis not present

## 2017-11-25 DIAGNOSIS — L280 Lichen simplex chronicus: Secondary | ICD-10-CM | POA: Diagnosis not present

## 2017-11-25 DIAGNOSIS — I4891 Unspecified atrial fibrillation: Secondary | ICD-10-CM | POA: Diagnosis not present

## 2017-11-25 DIAGNOSIS — R0902 Hypoxemia: Secondary | ICD-10-CM | POA: Diagnosis not present

## 2017-11-25 DIAGNOSIS — I509 Heart failure, unspecified: Secondary | ICD-10-CM | POA: Diagnosis not present

## 2017-11-28 ENCOUNTER — Ambulatory Visit: Payer: Medicare Other | Admitting: Nurse Practitioner

## 2017-11-28 DIAGNOSIS — I10 Essential (primary) hypertension: Secondary | ICD-10-CM | POA: Diagnosis not present

## 2017-11-28 DIAGNOSIS — E785 Hyperlipidemia, unspecified: Secondary | ICD-10-CM | POA: Diagnosis not present

## 2017-11-28 DIAGNOSIS — I509 Heart failure, unspecified: Secondary | ICD-10-CM | POA: Diagnosis not present

## 2017-11-28 DIAGNOSIS — I4891 Unspecified atrial fibrillation: Secondary | ICD-10-CM | POA: Diagnosis not present

## 2017-11-28 DIAGNOSIS — F0150 Vascular dementia without behavioral disturbance: Secondary | ICD-10-CM | POA: Diagnosis not present

## 2017-11-28 DIAGNOSIS — N184 Chronic kidney disease, stage 4 (severe): Secondary | ICD-10-CM | POA: Diagnosis not present

## 2017-11-28 NOTE — Telephone Encounter (Signed)
Spoke with [facility] and she states that all order requests are faxed electronically from their EMR. She does not ever physically fax anything. She will monitor the requests and let us know if she continues to have any issues with getting them returned. Nothing further needed at this time.   Dr. Elease Hashimoto - FYI. Thanks!

## 2017-11-28 NOTE — Telephone Encounter (Signed)
Placed in Dr Burchette's basket. 

## 2017-11-28 NOTE — Telephone Encounter (Signed)
Please verify, if we can, what is going on.  My red folder was empty this morning when I got here- so we need to determine why these forms aren't getting to [place].

## 2017-11-28 NOTE — Telephone Encounter (Signed)
LMTCB for [employer]

## 2017-11-29 DIAGNOSIS — E785 Hyperlipidemia, unspecified: Secondary | ICD-10-CM | POA: Diagnosis not present

## 2017-11-29 DIAGNOSIS — F0150 Vascular dementia without behavioral disturbance: Secondary | ICD-10-CM | POA: Diagnosis not present

## 2017-11-29 DIAGNOSIS — I4891 Unspecified atrial fibrillation: Secondary | ICD-10-CM | POA: Diagnosis not present

## 2017-11-29 DIAGNOSIS — N184 Chronic kidney disease, stage 4 (severe): Secondary | ICD-10-CM | POA: Diagnosis not present

## 2017-11-29 DIAGNOSIS — I509 Heart failure, unspecified: Secondary | ICD-10-CM | POA: Diagnosis not present

## 2017-11-29 DIAGNOSIS — I10 Essential (primary) hypertension: Secondary | ICD-10-CM | POA: Diagnosis not present

## 2017-12-02 DIAGNOSIS — E785 Hyperlipidemia, unspecified: Secondary | ICD-10-CM | POA: Diagnosis not present

## 2017-12-02 DIAGNOSIS — I509 Heart failure, unspecified: Secondary | ICD-10-CM | POA: Diagnosis not present

## 2017-12-02 DIAGNOSIS — N184 Chronic kidney disease, stage 4 (severe): Secondary | ICD-10-CM | POA: Diagnosis not present

## 2017-12-02 DIAGNOSIS — I10 Essential (primary) hypertension: Secondary | ICD-10-CM | POA: Diagnosis not present

## 2017-12-02 DIAGNOSIS — F0150 Vascular dementia without behavioral disturbance: Secondary | ICD-10-CM | POA: Diagnosis not present

## 2017-12-02 DIAGNOSIS — I4891 Unspecified atrial fibrillation: Secondary | ICD-10-CM | POA: Diagnosis not present

## 2017-12-05 DIAGNOSIS — I509 Heart failure, unspecified: Secondary | ICD-10-CM | POA: Diagnosis not present

## 2017-12-05 DIAGNOSIS — I4891 Unspecified atrial fibrillation: Secondary | ICD-10-CM | POA: Diagnosis not present

## 2017-12-05 DIAGNOSIS — F0150 Vascular dementia without behavioral disturbance: Secondary | ICD-10-CM | POA: Diagnosis not present

## 2017-12-05 DIAGNOSIS — E785 Hyperlipidemia, unspecified: Secondary | ICD-10-CM | POA: Diagnosis not present

## 2017-12-05 DIAGNOSIS — N184 Chronic kidney disease, stage 4 (severe): Secondary | ICD-10-CM | POA: Diagnosis not present

## 2017-12-05 DIAGNOSIS — I10 Essential (primary) hypertension: Secondary | ICD-10-CM | POA: Diagnosis not present

## 2017-12-06 ENCOUNTER — Ambulatory Visit: Payer: Medicare Other | Admitting: Nurse Practitioner

## 2017-12-06 DIAGNOSIS — I509 Heart failure, unspecified: Secondary | ICD-10-CM | POA: Diagnosis not present

## 2017-12-06 DIAGNOSIS — I10 Essential (primary) hypertension: Secondary | ICD-10-CM | POA: Diagnosis not present

## 2017-12-06 DIAGNOSIS — F0150 Vascular dementia without behavioral disturbance: Secondary | ICD-10-CM | POA: Diagnosis not present

## 2017-12-06 DIAGNOSIS — N184 Chronic kidney disease, stage 4 (severe): Secondary | ICD-10-CM | POA: Diagnosis not present

## 2017-12-06 DIAGNOSIS — I4891 Unspecified atrial fibrillation: Secondary | ICD-10-CM | POA: Diagnosis not present

## 2017-12-06 DIAGNOSIS — E785 Hyperlipidemia, unspecified: Secondary | ICD-10-CM | POA: Diagnosis not present

## 2017-12-09 DIAGNOSIS — I10 Essential (primary) hypertension: Secondary | ICD-10-CM | POA: Diagnosis not present

## 2017-12-09 DIAGNOSIS — F0150 Vascular dementia without behavioral disturbance: Secondary | ICD-10-CM | POA: Diagnosis not present

## 2017-12-09 DIAGNOSIS — N184 Chronic kidney disease, stage 4 (severe): Secondary | ICD-10-CM | POA: Diagnosis not present

## 2017-12-09 DIAGNOSIS — E785 Hyperlipidemia, unspecified: Secondary | ICD-10-CM | POA: Diagnosis not present

## 2017-12-09 DIAGNOSIS — I4891 Unspecified atrial fibrillation: Secondary | ICD-10-CM | POA: Diagnosis not present

## 2017-12-09 DIAGNOSIS — I509 Heart failure, unspecified: Secondary | ICD-10-CM | POA: Diagnosis not present

## 2017-12-10 DIAGNOSIS — I509 Heart failure, unspecified: Secondary | ICD-10-CM | POA: Diagnosis not present

## 2017-12-10 DIAGNOSIS — N184 Chronic kidney disease, stage 4 (severe): Secondary | ICD-10-CM | POA: Diagnosis not present

## 2017-12-10 DIAGNOSIS — E785 Hyperlipidemia, unspecified: Secondary | ICD-10-CM | POA: Diagnosis not present

## 2017-12-10 DIAGNOSIS — F0150 Vascular dementia without behavioral disturbance: Secondary | ICD-10-CM | POA: Diagnosis not present

## 2017-12-10 DIAGNOSIS — I4891 Unspecified atrial fibrillation: Secondary | ICD-10-CM | POA: Diagnosis not present

## 2017-12-10 DIAGNOSIS — I10 Essential (primary) hypertension: Secondary | ICD-10-CM | POA: Diagnosis not present

## 2017-12-12 DIAGNOSIS — N184 Chronic kidney disease, stage 4 (severe): Secondary | ICD-10-CM | POA: Diagnosis not present

## 2017-12-12 DIAGNOSIS — I4891 Unspecified atrial fibrillation: Secondary | ICD-10-CM | POA: Diagnosis not present

## 2017-12-12 DIAGNOSIS — E785 Hyperlipidemia, unspecified: Secondary | ICD-10-CM | POA: Diagnosis not present

## 2017-12-12 DIAGNOSIS — F0150 Vascular dementia without behavioral disturbance: Secondary | ICD-10-CM | POA: Diagnosis not present

## 2017-12-12 DIAGNOSIS — I10 Essential (primary) hypertension: Secondary | ICD-10-CM | POA: Diagnosis not present

## 2017-12-12 DIAGNOSIS — I509 Heart failure, unspecified: Secondary | ICD-10-CM | POA: Diagnosis not present

## 2017-12-13 DIAGNOSIS — I482 Chronic atrial fibrillation: Secondary | ICD-10-CM | POA: Diagnosis not present

## 2017-12-13 DIAGNOSIS — G8929 Other chronic pain: Secondary | ICD-10-CM | POA: Diagnosis not present

## 2017-12-13 DIAGNOSIS — F0150 Vascular dementia without behavioral disturbance: Secondary | ICD-10-CM | POA: Diagnosis not present

## 2017-12-13 DIAGNOSIS — K5900 Constipation, unspecified: Secondary | ICD-10-CM | POA: Diagnosis not present

## 2017-12-13 DIAGNOSIS — I5042 Chronic combined systolic (congestive) and diastolic (congestive) heart failure: Secondary | ICD-10-CM | POA: Diagnosis not present

## 2017-12-13 DIAGNOSIS — G2581 Restless legs syndrome: Secondary | ICD-10-CM | POA: Diagnosis not present

## 2017-12-13 DIAGNOSIS — E785 Hyperlipidemia, unspecified: Secondary | ICD-10-CM | POA: Diagnosis not present

## 2017-12-13 DIAGNOSIS — I509 Heart failure, unspecified: Secondary | ICD-10-CM | POA: Diagnosis not present

## 2017-12-13 DIAGNOSIS — I10 Essential (primary) hypertension: Secondary | ICD-10-CM | POA: Diagnosis not present

## 2017-12-13 DIAGNOSIS — I4891 Unspecified atrial fibrillation: Secondary | ICD-10-CM | POA: Diagnosis not present

## 2017-12-13 DIAGNOSIS — N184 Chronic kidney disease, stage 4 (severe): Secondary | ICD-10-CM | POA: Diagnosis not present

## 2017-12-14 DIAGNOSIS — F0150 Vascular dementia without behavioral disturbance: Secondary | ICD-10-CM | POA: Diagnosis not present

## 2017-12-14 DIAGNOSIS — N184 Chronic kidney disease, stage 4 (severe): Secondary | ICD-10-CM | POA: Diagnosis not present

## 2017-12-14 DIAGNOSIS — I4891 Unspecified atrial fibrillation: Secondary | ICD-10-CM | POA: Diagnosis not present

## 2017-12-14 DIAGNOSIS — I509 Heart failure, unspecified: Secondary | ICD-10-CM | POA: Diagnosis not present

## 2017-12-14 DIAGNOSIS — E785 Hyperlipidemia, unspecified: Secondary | ICD-10-CM | POA: Diagnosis not present

## 2017-12-14 DIAGNOSIS — I10 Essential (primary) hypertension: Secondary | ICD-10-CM | POA: Diagnosis not present

## 2017-12-16 DIAGNOSIS — I509 Heart failure, unspecified: Secondary | ICD-10-CM | POA: Diagnosis not present

## 2017-12-16 DIAGNOSIS — I4891 Unspecified atrial fibrillation: Secondary | ICD-10-CM | POA: Diagnosis not present

## 2017-12-16 DIAGNOSIS — I10 Essential (primary) hypertension: Secondary | ICD-10-CM | POA: Diagnosis not present

## 2017-12-16 DIAGNOSIS — F0150 Vascular dementia without behavioral disturbance: Secondary | ICD-10-CM | POA: Diagnosis not present

## 2017-12-16 DIAGNOSIS — E785 Hyperlipidemia, unspecified: Secondary | ICD-10-CM | POA: Diagnosis not present

## 2017-12-16 DIAGNOSIS — N184 Chronic kidney disease, stage 4 (severe): Secondary | ICD-10-CM | POA: Diagnosis not present

## 2017-12-17 DIAGNOSIS — E785 Hyperlipidemia, unspecified: Secondary | ICD-10-CM | POA: Diagnosis not present

## 2017-12-17 DIAGNOSIS — I10 Essential (primary) hypertension: Secondary | ICD-10-CM | POA: Diagnosis not present

## 2017-12-17 DIAGNOSIS — N184 Chronic kidney disease, stage 4 (severe): Secondary | ICD-10-CM | POA: Diagnosis not present

## 2017-12-17 DIAGNOSIS — F0150 Vascular dementia without behavioral disturbance: Secondary | ICD-10-CM | POA: Diagnosis not present

## 2017-12-17 DIAGNOSIS — I509 Heart failure, unspecified: Secondary | ICD-10-CM | POA: Diagnosis not present

## 2017-12-17 DIAGNOSIS — I4891 Unspecified atrial fibrillation: Secondary | ICD-10-CM | POA: Diagnosis not present

## 2017-12-18 DIAGNOSIS — N184 Chronic kidney disease, stage 4 (severe): Secondary | ICD-10-CM | POA: Diagnosis not present

## 2017-12-18 DIAGNOSIS — I509 Heart failure, unspecified: Secondary | ICD-10-CM | POA: Diagnosis not present

## 2017-12-18 DIAGNOSIS — F0150 Vascular dementia without behavioral disturbance: Secondary | ICD-10-CM | POA: Diagnosis not present

## 2017-12-18 DIAGNOSIS — I10 Essential (primary) hypertension: Secondary | ICD-10-CM | POA: Diagnosis not present

## 2017-12-18 DIAGNOSIS — E785 Hyperlipidemia, unspecified: Secondary | ICD-10-CM | POA: Diagnosis not present

## 2017-12-18 DIAGNOSIS — I4891 Unspecified atrial fibrillation: Secondary | ICD-10-CM | POA: Diagnosis not present

## 2017-12-19 DIAGNOSIS — I10 Essential (primary) hypertension: Secondary | ICD-10-CM | POA: Diagnosis not present

## 2017-12-19 DIAGNOSIS — I4891 Unspecified atrial fibrillation: Secondary | ICD-10-CM | POA: Diagnosis not present

## 2017-12-19 DIAGNOSIS — I509 Heart failure, unspecified: Secondary | ICD-10-CM | POA: Diagnosis not present

## 2017-12-19 DIAGNOSIS — G3184 Mild cognitive impairment, so stated: Secondary | ICD-10-CM | POA: Diagnosis not present

## 2017-12-19 DIAGNOSIS — F0150 Vascular dementia without behavioral disturbance: Secondary | ICD-10-CM | POA: Diagnosis not present

## 2017-12-19 DIAGNOSIS — N184 Chronic kidney disease, stage 4 (severe): Secondary | ICD-10-CM | POA: Diagnosis not present

## 2017-12-19 DIAGNOSIS — E785 Hyperlipidemia, unspecified: Secondary | ICD-10-CM | POA: Diagnosis not present

## 2017-12-19 DIAGNOSIS — F419 Anxiety disorder, unspecified: Secondary | ICD-10-CM | POA: Diagnosis not present

## 2017-12-20 DIAGNOSIS — F0150 Vascular dementia without behavioral disturbance: Secondary | ICD-10-CM | POA: Diagnosis not present

## 2017-12-20 DIAGNOSIS — I509 Heart failure, unspecified: Secondary | ICD-10-CM | POA: Diagnosis not present

## 2017-12-20 DIAGNOSIS — E785 Hyperlipidemia, unspecified: Secondary | ICD-10-CM | POA: Diagnosis not present

## 2017-12-20 DIAGNOSIS — I10 Essential (primary) hypertension: Secondary | ICD-10-CM | POA: Diagnosis not present

## 2017-12-20 DIAGNOSIS — N184 Chronic kidney disease, stage 4 (severe): Secondary | ICD-10-CM | POA: Diagnosis not present

## 2017-12-20 DIAGNOSIS — I4891 Unspecified atrial fibrillation: Secondary | ICD-10-CM | POA: Diagnosis not present

## 2017-12-21 DIAGNOSIS — I482 Chronic atrial fibrillation: Secondary | ICD-10-CM | POA: Diagnosis not present

## 2017-12-21 DIAGNOSIS — I10 Essential (primary) hypertension: Secondary | ICD-10-CM | POA: Diagnosis not present

## 2017-12-21 DIAGNOSIS — E785 Hyperlipidemia, unspecified: Secondary | ICD-10-CM | POA: Diagnosis not present

## 2017-12-21 DIAGNOSIS — I509 Heart failure, unspecified: Secondary | ICD-10-CM | POA: Diagnosis not present

## 2017-12-21 DIAGNOSIS — I4891 Unspecified atrial fibrillation: Secondary | ICD-10-CM | POA: Diagnosis not present

## 2017-12-21 DIAGNOSIS — F0150 Vascular dementia without behavioral disturbance: Secondary | ICD-10-CM | POA: Diagnosis not present

## 2017-12-21 DIAGNOSIS — N184 Chronic kidney disease, stage 4 (severe): Secondary | ICD-10-CM | POA: Diagnosis not present

## 2017-12-22 DIAGNOSIS — N184 Chronic kidney disease, stage 4 (severe): Secondary | ICD-10-CM | POA: Diagnosis not present

## 2017-12-22 DIAGNOSIS — I509 Heart failure, unspecified: Secondary | ICD-10-CM | POA: Diagnosis not present

## 2017-12-22 DIAGNOSIS — I10 Essential (primary) hypertension: Secondary | ICD-10-CM | POA: Diagnosis not present

## 2017-12-22 DIAGNOSIS — I4891 Unspecified atrial fibrillation: Secondary | ICD-10-CM | POA: Diagnosis not present

## 2017-12-22 DIAGNOSIS — F0150 Vascular dementia without behavioral disturbance: Secondary | ICD-10-CM | POA: Diagnosis not present

## 2017-12-22 DIAGNOSIS — E785 Hyperlipidemia, unspecified: Secondary | ICD-10-CM | POA: Diagnosis not present

## 2017-12-23 DIAGNOSIS — E785 Hyperlipidemia, unspecified: Secondary | ICD-10-CM | POA: Diagnosis not present

## 2017-12-23 DIAGNOSIS — N184 Chronic kidney disease, stage 4 (severe): Secondary | ICD-10-CM | POA: Diagnosis not present

## 2017-12-23 DIAGNOSIS — I10 Essential (primary) hypertension: Secondary | ICD-10-CM | POA: Diagnosis not present

## 2017-12-23 DIAGNOSIS — I509 Heart failure, unspecified: Secondary | ICD-10-CM | POA: Diagnosis not present

## 2017-12-23 DIAGNOSIS — F0150 Vascular dementia without behavioral disturbance: Secondary | ICD-10-CM | POA: Diagnosis not present

## 2017-12-23 DIAGNOSIS — I4891 Unspecified atrial fibrillation: Secondary | ICD-10-CM | POA: Diagnosis not present

## 2017-12-24 DIAGNOSIS — I509 Heart failure, unspecified: Secondary | ICD-10-CM | POA: Diagnosis not present

## 2017-12-24 DIAGNOSIS — F0150 Vascular dementia without behavioral disturbance: Secondary | ICD-10-CM | POA: Diagnosis not present

## 2017-12-24 DIAGNOSIS — N184 Chronic kidney disease, stage 4 (severe): Secondary | ICD-10-CM | POA: Diagnosis not present

## 2017-12-24 DIAGNOSIS — I4891 Unspecified atrial fibrillation: Secondary | ICD-10-CM | POA: Diagnosis not present

## 2017-12-24 DIAGNOSIS — E785 Hyperlipidemia, unspecified: Secondary | ICD-10-CM | POA: Diagnosis not present

## 2017-12-24 DIAGNOSIS — I10 Essential (primary) hypertension: Secondary | ICD-10-CM | POA: Diagnosis not present

## 2017-12-25 DIAGNOSIS — I10 Essential (primary) hypertension: Secondary | ICD-10-CM | POA: Diagnosis not present

## 2017-12-25 DIAGNOSIS — I4891 Unspecified atrial fibrillation: Secondary | ICD-10-CM | POA: Diagnosis not present

## 2017-12-25 DIAGNOSIS — F0150 Vascular dementia without behavioral disturbance: Secondary | ICD-10-CM | POA: Diagnosis not present

## 2017-12-25 DIAGNOSIS — N184 Chronic kidney disease, stage 4 (severe): Secondary | ICD-10-CM | POA: Diagnosis not present

## 2017-12-25 DIAGNOSIS — I509 Heart failure, unspecified: Secondary | ICD-10-CM | POA: Diagnosis not present

## 2017-12-25 DIAGNOSIS — E785 Hyperlipidemia, unspecified: Secondary | ICD-10-CM | POA: Diagnosis not present

## 2017-12-26 DIAGNOSIS — N390 Urinary tract infection, site not specified: Secondary | ICD-10-CM | POA: Diagnosis not present

## 2017-12-26 DIAGNOSIS — I4891 Unspecified atrial fibrillation: Secondary | ICD-10-CM | POA: Diagnosis not present

## 2017-12-26 DIAGNOSIS — N184 Chronic kidney disease, stage 4 (severe): Secondary | ICD-10-CM | POA: Diagnosis not present

## 2017-12-26 DIAGNOSIS — I10 Essential (primary) hypertension: Secondary | ICD-10-CM | POA: Diagnosis not present

## 2017-12-26 DIAGNOSIS — R0600 Dyspnea, unspecified: Secondary | ICD-10-CM | POA: Diagnosis not present

## 2017-12-26 DIAGNOSIS — L280 Lichen simplex chronicus: Secondary | ICD-10-CM | POA: Diagnosis not present

## 2017-12-26 DIAGNOSIS — N3281 Overactive bladder: Secondary | ICD-10-CM | POA: Diagnosis not present

## 2017-12-26 DIAGNOSIS — I509 Heart failure, unspecified: Secondary | ICD-10-CM | POA: Diagnosis not present

## 2017-12-26 DIAGNOSIS — G2581 Restless legs syndrome: Secondary | ICD-10-CM | POA: Diagnosis not present

## 2017-12-26 DIAGNOSIS — F0150 Vascular dementia without behavioral disturbance: Secondary | ICD-10-CM | POA: Diagnosis not present

## 2017-12-26 DIAGNOSIS — E785 Hyperlipidemia, unspecified: Secondary | ICD-10-CM | POA: Diagnosis not present

## 2017-12-26 DIAGNOSIS — R0902 Hypoxemia: Secondary | ICD-10-CM | POA: Diagnosis not present

## 2017-12-27 DIAGNOSIS — I509 Heart failure, unspecified: Secondary | ICD-10-CM | POA: Diagnosis not present

## 2017-12-27 DIAGNOSIS — E785 Hyperlipidemia, unspecified: Secondary | ICD-10-CM | POA: Diagnosis not present

## 2017-12-27 DIAGNOSIS — N184 Chronic kidney disease, stage 4 (severe): Secondary | ICD-10-CM | POA: Diagnosis not present

## 2017-12-27 DIAGNOSIS — F0150 Vascular dementia without behavioral disturbance: Secondary | ICD-10-CM | POA: Diagnosis not present

## 2017-12-27 DIAGNOSIS — I4891 Unspecified atrial fibrillation: Secondary | ICD-10-CM | POA: Diagnosis not present

## 2017-12-27 DIAGNOSIS — I10 Essential (primary) hypertension: Secondary | ICD-10-CM | POA: Diagnosis not present

## 2017-12-29 DIAGNOSIS — N184 Chronic kidney disease, stage 4 (severe): Secondary | ICD-10-CM | POA: Diagnosis not present

## 2017-12-29 DIAGNOSIS — E785 Hyperlipidemia, unspecified: Secondary | ICD-10-CM | POA: Diagnosis not present

## 2017-12-29 DIAGNOSIS — F0150 Vascular dementia without behavioral disturbance: Secondary | ICD-10-CM | POA: Diagnosis not present

## 2017-12-29 DIAGNOSIS — I509 Heart failure, unspecified: Secondary | ICD-10-CM | POA: Diagnosis not present

## 2017-12-29 DIAGNOSIS — I4891 Unspecified atrial fibrillation: Secondary | ICD-10-CM | POA: Diagnosis not present

## 2017-12-29 DIAGNOSIS — I10 Essential (primary) hypertension: Secondary | ICD-10-CM | POA: Diagnosis not present

## 2017-12-30 DIAGNOSIS — I509 Heart failure, unspecified: Secondary | ICD-10-CM | POA: Diagnosis not present

## 2017-12-30 DIAGNOSIS — I4891 Unspecified atrial fibrillation: Secondary | ICD-10-CM | POA: Diagnosis not present

## 2017-12-30 DIAGNOSIS — I10 Essential (primary) hypertension: Secondary | ICD-10-CM | POA: Diagnosis not present

## 2017-12-30 DIAGNOSIS — E785 Hyperlipidemia, unspecified: Secondary | ICD-10-CM | POA: Diagnosis not present

## 2017-12-30 DIAGNOSIS — N184 Chronic kidney disease, stage 4 (severe): Secondary | ICD-10-CM | POA: Diagnosis not present

## 2017-12-30 DIAGNOSIS — F0150 Vascular dementia without behavioral disturbance: Secondary | ICD-10-CM | POA: Diagnosis not present

## 2017-12-31 DIAGNOSIS — F0150 Vascular dementia without behavioral disturbance: Secondary | ICD-10-CM | POA: Diagnosis not present

## 2017-12-31 DIAGNOSIS — I10 Essential (primary) hypertension: Secondary | ICD-10-CM | POA: Diagnosis not present

## 2017-12-31 DIAGNOSIS — N184 Chronic kidney disease, stage 4 (severe): Secondary | ICD-10-CM | POA: Diagnosis not present

## 2017-12-31 DIAGNOSIS — I509 Heart failure, unspecified: Secondary | ICD-10-CM | POA: Diagnosis not present

## 2017-12-31 DIAGNOSIS — I4891 Unspecified atrial fibrillation: Secondary | ICD-10-CM | POA: Diagnosis not present

## 2017-12-31 DIAGNOSIS — E785 Hyperlipidemia, unspecified: Secondary | ICD-10-CM | POA: Diagnosis not present

## 2018-01-02 ENCOUNTER — Ambulatory Visit: Payer: Self-pay | Admitting: General Practice

## 2018-01-02 DIAGNOSIS — E785 Hyperlipidemia, unspecified: Secondary | ICD-10-CM | POA: Diagnosis not present

## 2018-01-02 DIAGNOSIS — I509 Heart failure, unspecified: Secondary | ICD-10-CM | POA: Diagnosis not present

## 2018-01-02 DIAGNOSIS — N184 Chronic kidney disease, stage 4 (severe): Secondary | ICD-10-CM | POA: Diagnosis not present

## 2018-01-02 DIAGNOSIS — I4891 Unspecified atrial fibrillation: Secondary | ICD-10-CM | POA: Diagnosis not present

## 2018-01-02 DIAGNOSIS — F0150 Vascular dementia without behavioral disturbance: Secondary | ICD-10-CM | POA: Diagnosis not present

## 2018-01-02 DIAGNOSIS — I10 Essential (primary) hypertension: Secondary | ICD-10-CM | POA: Diagnosis not present

## 2018-01-03 DIAGNOSIS — E785 Hyperlipidemia, unspecified: Secondary | ICD-10-CM | POA: Diagnosis not present

## 2018-01-03 DIAGNOSIS — I4891 Unspecified atrial fibrillation: Secondary | ICD-10-CM | POA: Diagnosis not present

## 2018-01-03 DIAGNOSIS — F0150 Vascular dementia without behavioral disturbance: Secondary | ICD-10-CM | POA: Diagnosis not present

## 2018-01-03 DIAGNOSIS — N184 Chronic kidney disease, stage 4 (severe): Secondary | ICD-10-CM | POA: Diagnosis not present

## 2018-01-03 DIAGNOSIS — I509 Heart failure, unspecified: Secondary | ICD-10-CM | POA: Diagnosis not present

## 2018-01-03 DIAGNOSIS — I10 Essential (primary) hypertension: Secondary | ICD-10-CM | POA: Diagnosis not present

## 2018-01-04 DIAGNOSIS — F0150 Vascular dementia without behavioral disturbance: Secondary | ICD-10-CM | POA: Diagnosis not present

## 2018-01-04 DIAGNOSIS — I509 Heart failure, unspecified: Secondary | ICD-10-CM | POA: Diagnosis not present

## 2018-01-04 DIAGNOSIS — I4891 Unspecified atrial fibrillation: Secondary | ICD-10-CM | POA: Diagnosis not present

## 2018-01-04 DIAGNOSIS — E785 Hyperlipidemia, unspecified: Secondary | ICD-10-CM | POA: Diagnosis not present

## 2018-01-04 DIAGNOSIS — N184 Chronic kidney disease, stage 4 (severe): Secondary | ICD-10-CM | POA: Diagnosis not present

## 2018-01-04 DIAGNOSIS — I10 Essential (primary) hypertension: Secondary | ICD-10-CM | POA: Diagnosis not present

## 2018-01-05 DIAGNOSIS — G3184 Mild cognitive impairment, so stated: Secondary | ICD-10-CM | POA: Diagnosis not present

## 2018-01-05 DIAGNOSIS — F419 Anxiety disorder, unspecified: Secondary | ICD-10-CM | POA: Diagnosis not present

## 2018-01-05 DIAGNOSIS — F331 Major depressive disorder, recurrent, moderate: Secondary | ICD-10-CM | POA: Diagnosis not present

## 2018-01-06 DIAGNOSIS — F0150 Vascular dementia without behavioral disturbance: Secondary | ICD-10-CM | POA: Diagnosis not present

## 2018-01-06 DIAGNOSIS — E785 Hyperlipidemia, unspecified: Secondary | ICD-10-CM | POA: Diagnosis not present

## 2018-01-06 DIAGNOSIS — I10 Essential (primary) hypertension: Secondary | ICD-10-CM | POA: Diagnosis not present

## 2018-01-06 DIAGNOSIS — I4891 Unspecified atrial fibrillation: Secondary | ICD-10-CM | POA: Diagnosis not present

## 2018-01-06 DIAGNOSIS — I509 Heart failure, unspecified: Secondary | ICD-10-CM | POA: Diagnosis not present

## 2018-01-06 DIAGNOSIS — N184 Chronic kidney disease, stage 4 (severe): Secondary | ICD-10-CM | POA: Diagnosis not present

## 2018-01-09 DIAGNOSIS — E785 Hyperlipidemia, unspecified: Secondary | ICD-10-CM | POA: Diagnosis not present

## 2018-01-09 DIAGNOSIS — I509 Heart failure, unspecified: Secondary | ICD-10-CM | POA: Diagnosis not present

## 2018-01-09 DIAGNOSIS — I10 Essential (primary) hypertension: Secondary | ICD-10-CM | POA: Diagnosis not present

## 2018-01-09 DIAGNOSIS — I4891 Unspecified atrial fibrillation: Secondary | ICD-10-CM | POA: Diagnosis not present

## 2018-01-09 DIAGNOSIS — N184 Chronic kidney disease, stage 4 (severe): Secondary | ICD-10-CM | POA: Diagnosis not present

## 2018-01-09 DIAGNOSIS — F0150 Vascular dementia without behavioral disturbance: Secondary | ICD-10-CM | POA: Diagnosis not present

## 2018-01-10 DIAGNOSIS — I10 Essential (primary) hypertension: Secondary | ICD-10-CM | POA: Diagnosis not present

## 2018-01-10 DIAGNOSIS — F419 Anxiety disorder, unspecified: Secondary | ICD-10-CM | POA: Diagnosis not present

## 2018-01-10 DIAGNOSIS — F331 Major depressive disorder, recurrent, moderate: Secondary | ICD-10-CM | POA: Diagnosis not present

## 2018-01-10 DIAGNOSIS — G3184 Mild cognitive impairment, so stated: Secondary | ICD-10-CM | POA: Diagnosis not present

## 2018-01-10 DIAGNOSIS — I509 Heart failure, unspecified: Secondary | ICD-10-CM | POA: Diagnosis not present

## 2018-01-10 DIAGNOSIS — F0150 Vascular dementia without behavioral disturbance: Secondary | ICD-10-CM | POA: Diagnosis not present

## 2018-01-10 DIAGNOSIS — E785 Hyperlipidemia, unspecified: Secondary | ICD-10-CM | POA: Diagnosis not present

## 2018-01-10 DIAGNOSIS — N184 Chronic kidney disease, stage 4 (severe): Secondary | ICD-10-CM | POA: Diagnosis not present

## 2018-01-10 DIAGNOSIS — I4891 Unspecified atrial fibrillation: Secondary | ICD-10-CM | POA: Diagnosis not present

## 2018-01-12 DIAGNOSIS — E785 Hyperlipidemia, unspecified: Secondary | ICD-10-CM | POA: Diagnosis not present

## 2018-01-12 DIAGNOSIS — I10 Essential (primary) hypertension: Secondary | ICD-10-CM | POA: Diagnosis not present

## 2018-01-12 DIAGNOSIS — N184 Chronic kidney disease, stage 4 (severe): Secondary | ICD-10-CM | POA: Diagnosis not present

## 2018-01-12 DIAGNOSIS — I509 Heart failure, unspecified: Secondary | ICD-10-CM | POA: Diagnosis not present

## 2018-01-12 DIAGNOSIS — F0150 Vascular dementia without behavioral disturbance: Secondary | ICD-10-CM | POA: Diagnosis not present

## 2018-01-12 DIAGNOSIS — I4891 Unspecified atrial fibrillation: Secondary | ICD-10-CM | POA: Diagnosis not present

## 2018-01-13 DIAGNOSIS — I10 Essential (primary) hypertension: Secondary | ICD-10-CM | POA: Diagnosis not present

## 2018-01-13 DIAGNOSIS — K5900 Constipation, unspecified: Secondary | ICD-10-CM | POA: Diagnosis not present

## 2018-01-13 DIAGNOSIS — G3184 Mild cognitive impairment, so stated: Secondary | ICD-10-CM | POA: Diagnosis not present

## 2018-01-13 DIAGNOSIS — G2581 Restless legs syndrome: Secondary | ICD-10-CM | POA: Diagnosis not present

## 2018-01-13 DIAGNOSIS — F419 Anxiety disorder, unspecified: Secondary | ICD-10-CM | POA: Diagnosis not present

## 2018-01-13 DIAGNOSIS — I5042 Chronic combined systolic (congestive) and diastolic (congestive) heart failure: Secondary | ICD-10-CM | POA: Diagnosis not present

## 2018-01-13 DIAGNOSIS — G8929 Other chronic pain: Secondary | ICD-10-CM | POA: Diagnosis not present

## 2018-01-13 DIAGNOSIS — I482 Chronic atrial fibrillation: Secondary | ICD-10-CM | POA: Diagnosis not present

## 2018-01-14 DIAGNOSIS — I4891 Unspecified atrial fibrillation: Secondary | ICD-10-CM | POA: Diagnosis not present

## 2018-01-14 DIAGNOSIS — N184 Chronic kidney disease, stage 4 (severe): Secondary | ICD-10-CM | POA: Diagnosis not present

## 2018-01-14 DIAGNOSIS — E785 Hyperlipidemia, unspecified: Secondary | ICD-10-CM | POA: Diagnosis not present

## 2018-01-14 DIAGNOSIS — I509 Heart failure, unspecified: Secondary | ICD-10-CM | POA: Diagnosis not present

## 2018-01-14 DIAGNOSIS — F0150 Vascular dementia without behavioral disturbance: Secondary | ICD-10-CM | POA: Diagnosis not present

## 2018-01-14 DIAGNOSIS — I10 Essential (primary) hypertension: Secondary | ICD-10-CM | POA: Diagnosis not present

## 2018-01-15 DIAGNOSIS — F0150 Vascular dementia without behavioral disturbance: Secondary | ICD-10-CM | POA: Diagnosis not present

## 2018-01-15 DIAGNOSIS — E785 Hyperlipidemia, unspecified: Secondary | ICD-10-CM | POA: Diagnosis not present

## 2018-01-15 DIAGNOSIS — N184 Chronic kidney disease, stage 4 (severe): Secondary | ICD-10-CM | POA: Diagnosis not present

## 2018-01-15 DIAGNOSIS — I10 Essential (primary) hypertension: Secondary | ICD-10-CM | POA: Diagnosis not present

## 2018-01-15 DIAGNOSIS — I509 Heart failure, unspecified: Secondary | ICD-10-CM | POA: Diagnosis not present

## 2018-01-15 DIAGNOSIS — I4891 Unspecified atrial fibrillation: Secondary | ICD-10-CM | POA: Diagnosis not present

## 2018-01-16 DIAGNOSIS — I4891 Unspecified atrial fibrillation: Secondary | ICD-10-CM | POA: Diagnosis not present

## 2018-01-16 DIAGNOSIS — F0150 Vascular dementia without behavioral disturbance: Secondary | ICD-10-CM | POA: Diagnosis not present

## 2018-01-16 DIAGNOSIS — I10 Essential (primary) hypertension: Secondary | ICD-10-CM | POA: Diagnosis not present

## 2018-01-16 DIAGNOSIS — E785 Hyperlipidemia, unspecified: Secondary | ICD-10-CM | POA: Diagnosis not present

## 2018-01-16 DIAGNOSIS — I509 Heart failure, unspecified: Secondary | ICD-10-CM | POA: Diagnosis not present

## 2018-01-16 DIAGNOSIS — N184 Chronic kidney disease, stage 4 (severe): Secondary | ICD-10-CM | POA: Diagnosis not present

## 2018-01-17 DIAGNOSIS — F0150 Vascular dementia without behavioral disturbance: Secondary | ICD-10-CM | POA: Diagnosis not present

## 2018-01-17 DIAGNOSIS — I10 Essential (primary) hypertension: Secondary | ICD-10-CM | POA: Diagnosis not present

## 2018-01-17 DIAGNOSIS — I4891 Unspecified atrial fibrillation: Secondary | ICD-10-CM | POA: Diagnosis not present

## 2018-01-17 DIAGNOSIS — I509 Heart failure, unspecified: Secondary | ICD-10-CM | POA: Diagnosis not present

## 2018-01-17 DIAGNOSIS — E785 Hyperlipidemia, unspecified: Secondary | ICD-10-CM | POA: Diagnosis not present

## 2018-01-17 DIAGNOSIS — N184 Chronic kidney disease, stage 4 (severe): Secondary | ICD-10-CM | POA: Diagnosis not present

## 2018-01-23 DIAGNOSIS — G8929 Other chronic pain: Secondary | ICD-10-CM | POA: Diagnosis not present

## 2018-01-23 DIAGNOSIS — G3184 Mild cognitive impairment, so stated: Secondary | ICD-10-CM | POA: Diagnosis not present

## 2018-01-23 DIAGNOSIS — G2581 Restless legs syndrome: Secondary | ICD-10-CM | POA: Diagnosis not present

## 2018-01-23 DIAGNOSIS — I5042 Chronic combined systolic (congestive) and diastolic (congestive) heart failure: Secondary | ICD-10-CM | POA: Diagnosis not present

## 2018-01-23 DIAGNOSIS — I10 Essential (primary) hypertension: Secondary | ICD-10-CM | POA: Diagnosis not present

## 2018-01-23 DIAGNOSIS — K5900 Constipation, unspecified: Secondary | ICD-10-CM | POA: Diagnosis not present

## 2018-01-23 DIAGNOSIS — F419 Anxiety disorder, unspecified: Secondary | ICD-10-CM | POA: Diagnosis not present

## 2018-01-23 DIAGNOSIS — I482 Chronic atrial fibrillation: Secondary | ICD-10-CM | POA: Diagnosis not present

## 2018-01-25 DEATH — deceased

## 2018-04-17 IMAGING — CR DG ANKLE 2V *R*
2 series · 2 of 2 positions shown · non-contrast
Comparison: None.

CLINICAL DATA: Pain after fall

EXAM:
RIGHT ANKLE - 2 VIEW

[ankle ap]
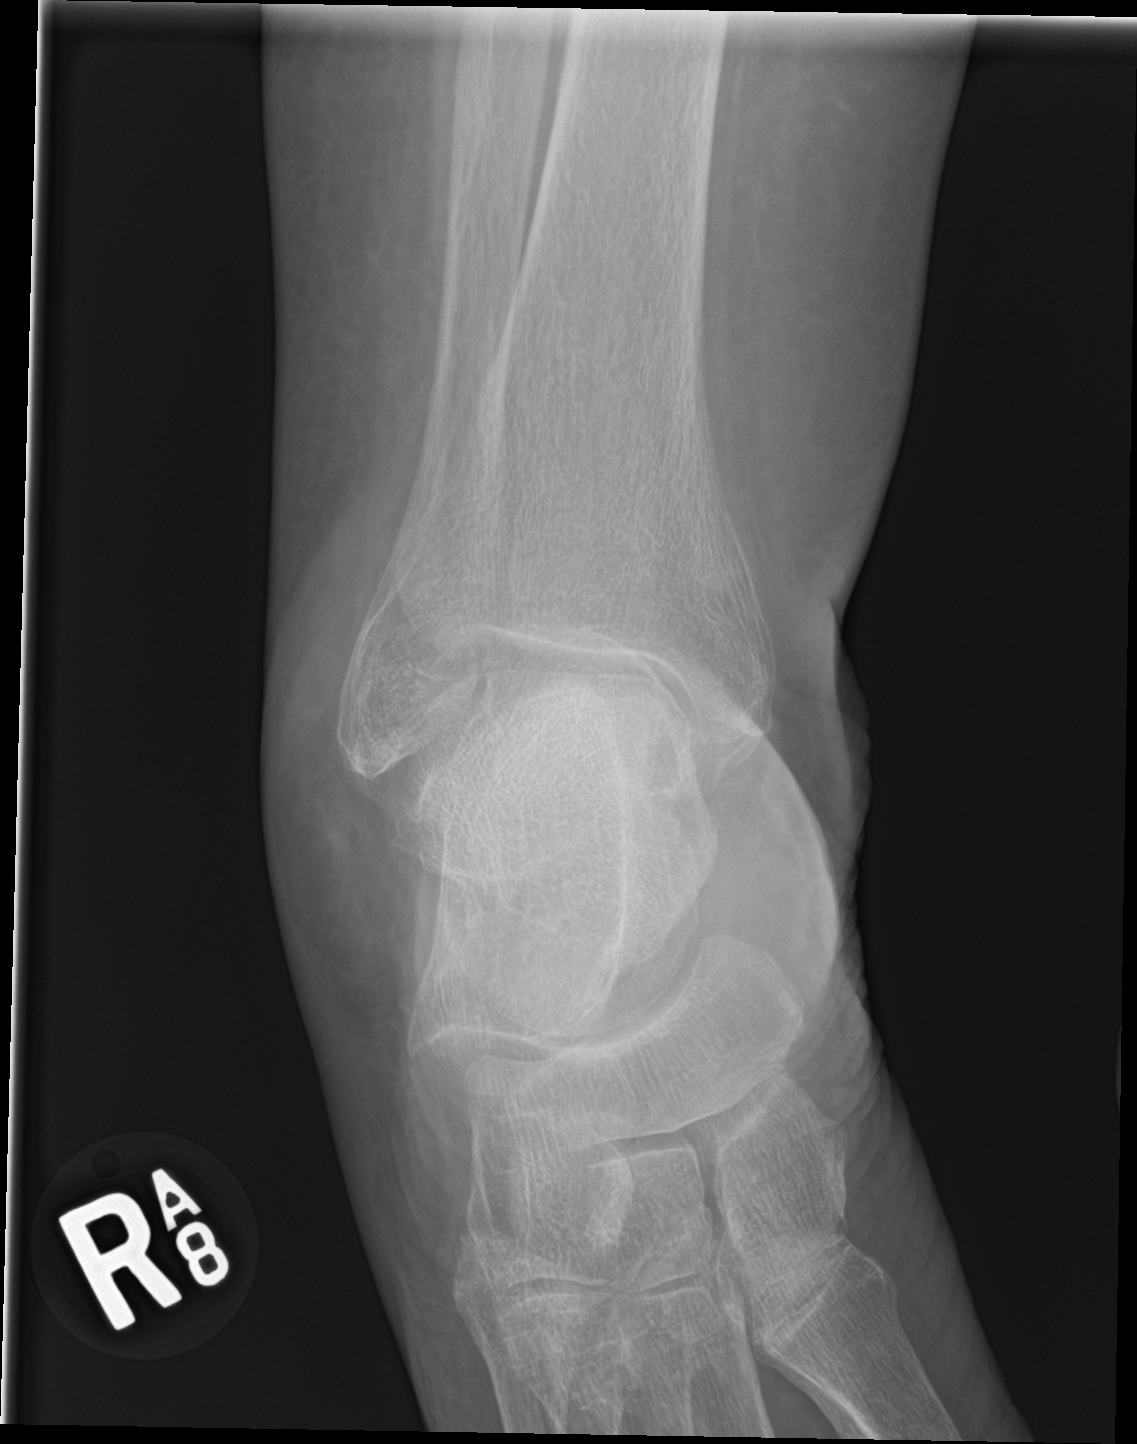

[ankle lat]
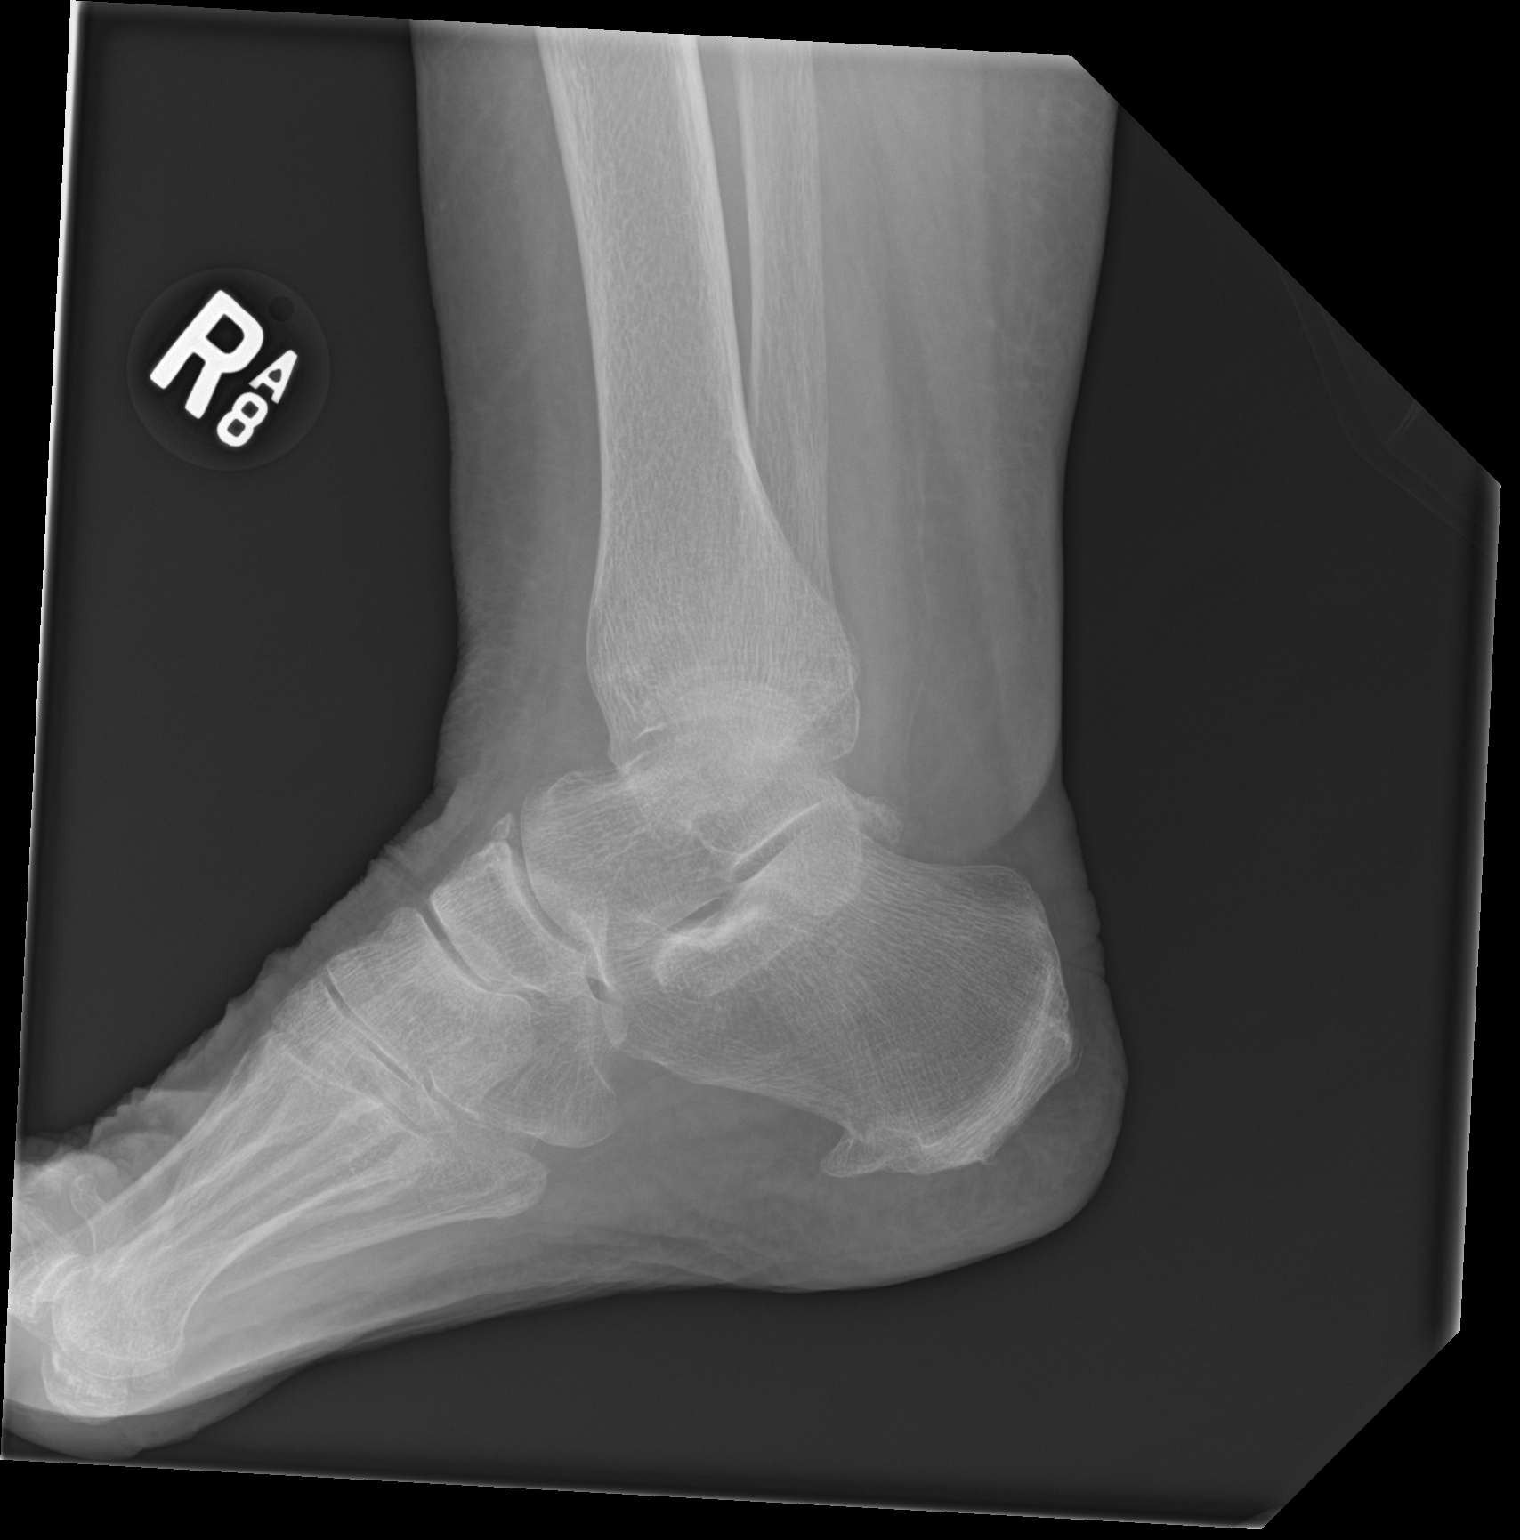

[2 of 2 positions shown; findings below may reference images not displayed]

FINDINGS: No acute displaced fracture or malalignment. Soft tissue swelling.
Large plantar calcaneal spur.
IMPRESSION: 1. No definite acute osseous abnormality
2. Large plantar calcaneal spur

## 2018-04-17 IMAGING — CR DG HIP (WITH OR WITHOUT PELVIS) 2-3V*R*
3 series · 3 of 3 positions shown · non-contrast
Comparison: CT abdomen pelvis 09/12/2017

CLINICAL DATA: Fall, with right hip pain

EXAM:
DG HIP (WITH OR WITHOUT PELVIS) 2-3V RIGHT

[pelvis ap]
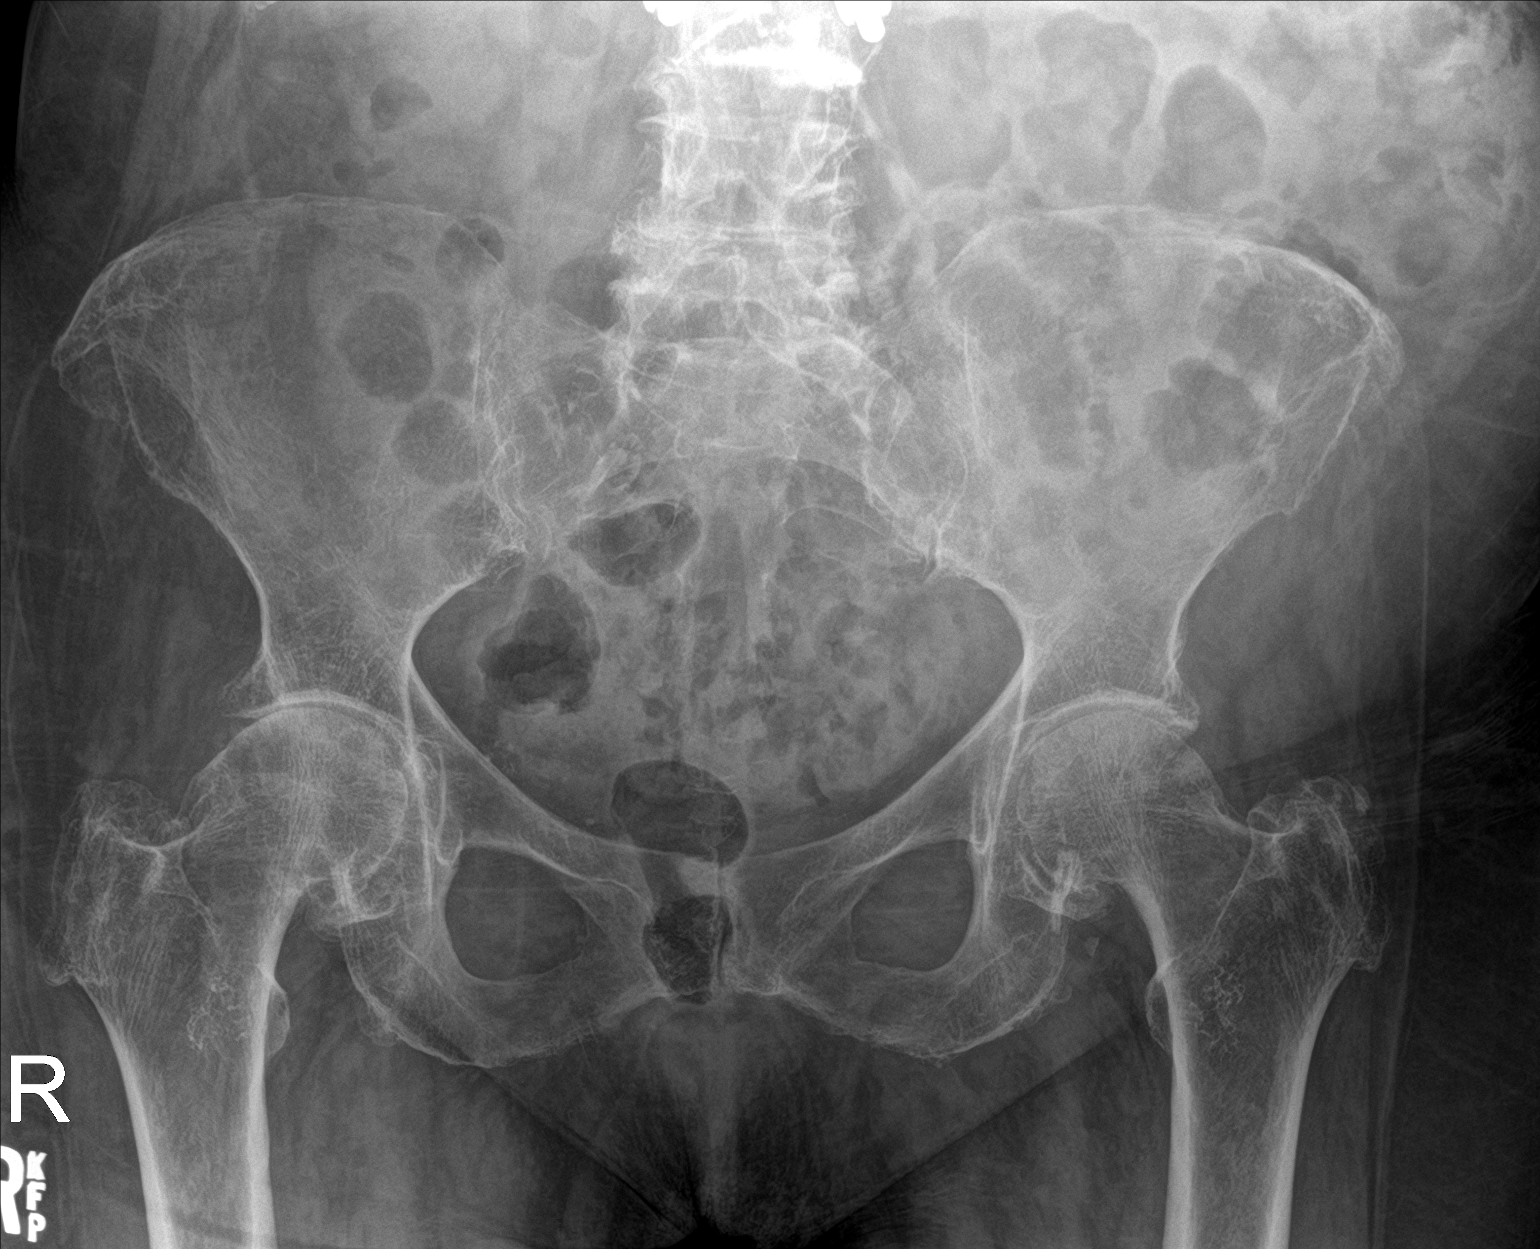

[hip ap]
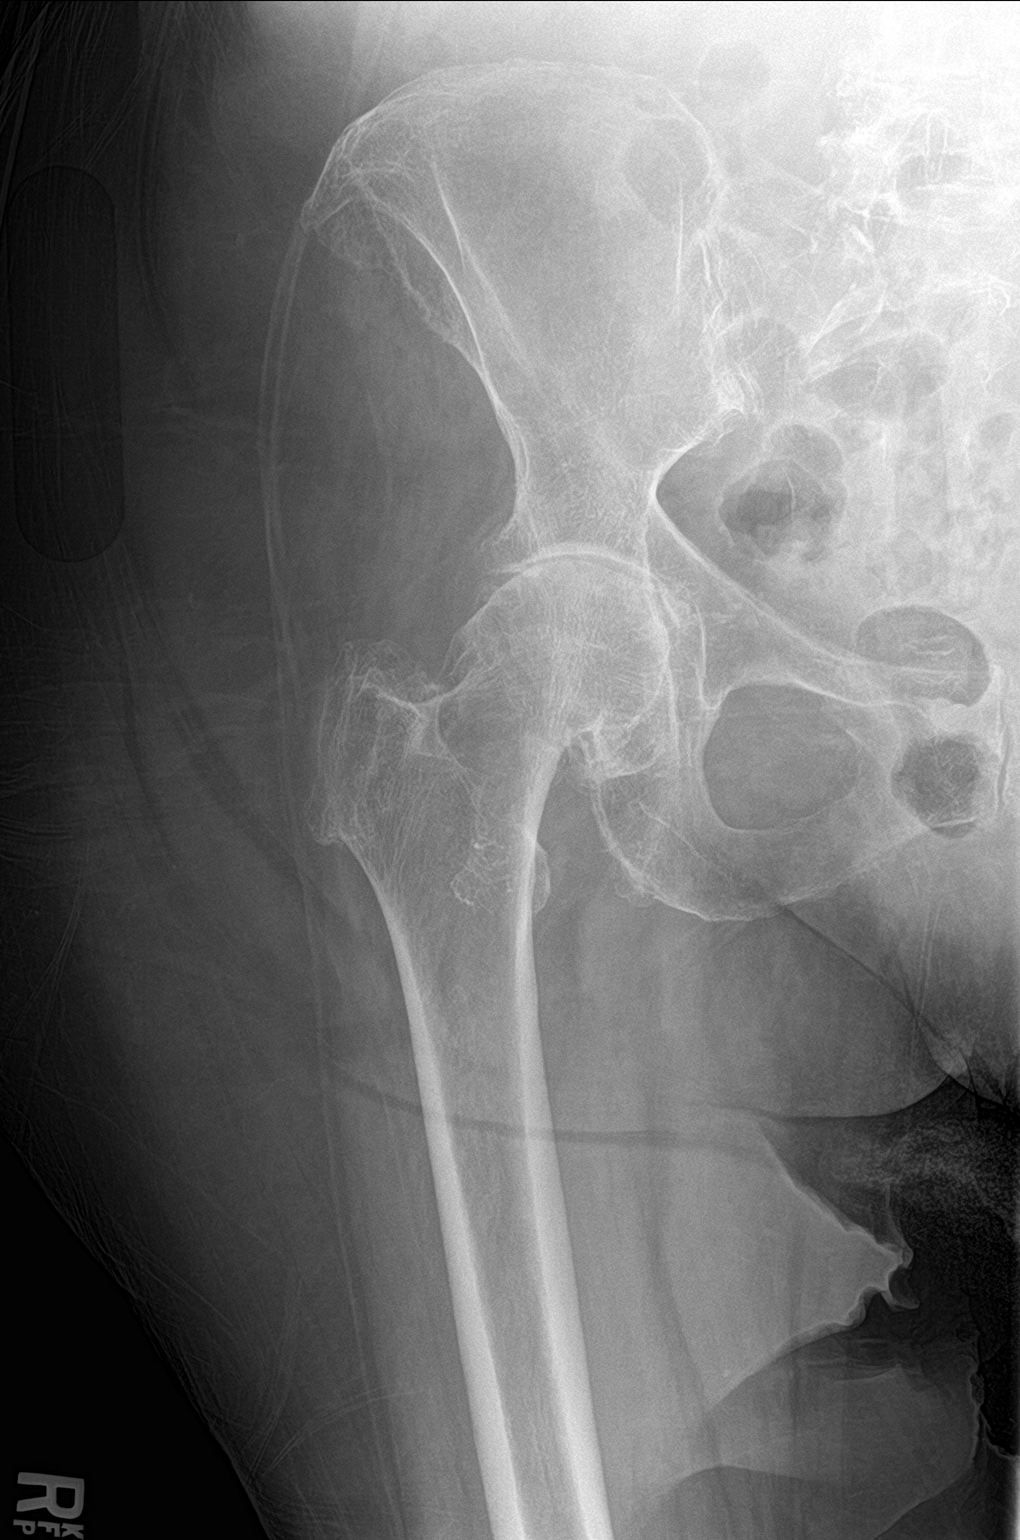

[hip lat]
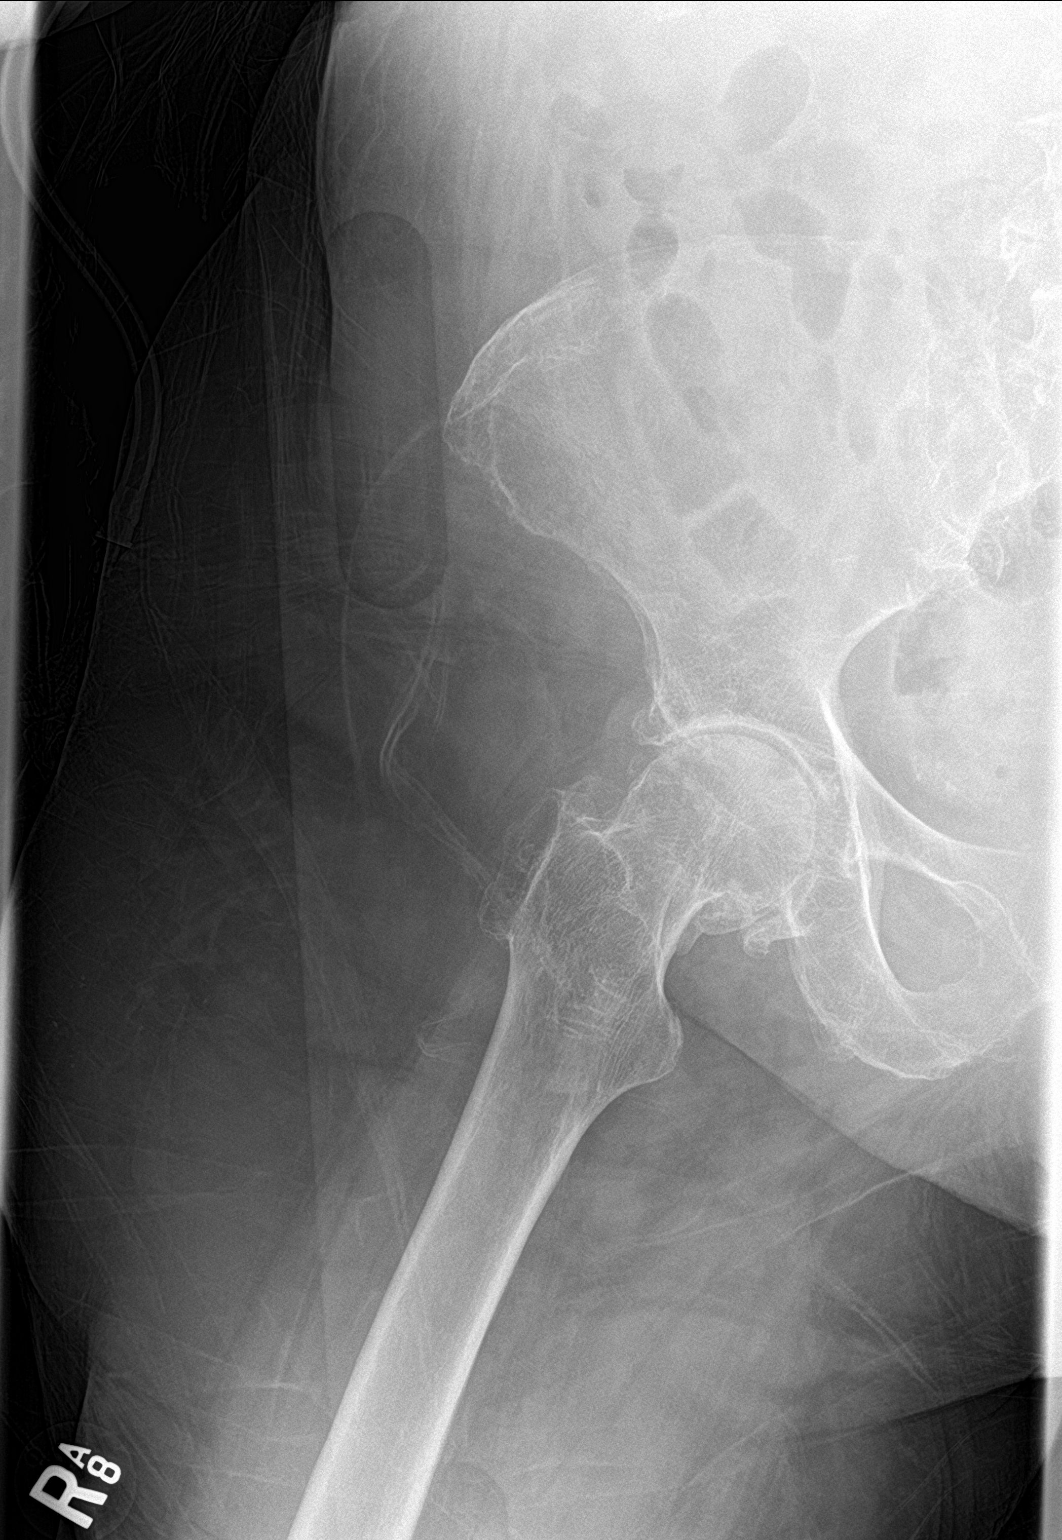

[3 of 3 positions shown; findings below may reference images not displayed]

FINDINGS: Irregularity at the right superior pubic ramus. Pubic symphysis is
intact. Femoral heads project in joint. Advanced arthritis of the
right greater than left hips. Prominent osteophytosis at the right
femoral head neck junction.
IMPRESSION: 1. Possible right superior pubic ramus fracture
2. Marked arthritis of the right greater than left hip with
prominent osteophytosis at the femoral head neck junction right
greater than left.
